# Patient Record
Sex: Female | Born: 1950
Health system: Southern US, Community
[De-identification: ages and names within clinical notes are randomized; demographics above are authoritative.]

## PROBLEM LIST (undated history)

## (undated) DIAGNOSIS — D126 Benign neoplasm of colon, unspecified: Secondary | ICD-10-CM

## (undated) DIAGNOSIS — M5412 Radiculopathy, cervical region: Secondary | ICD-10-CM

## (undated) DIAGNOSIS — D649 Anemia, unspecified: Secondary | ICD-10-CM

## (undated) DIAGNOSIS — E119 Type 2 diabetes mellitus without complications: Secondary | ICD-10-CM

## (undated) DIAGNOSIS — J040 Acute laryngitis: Secondary | ICD-10-CM

## (undated) DIAGNOSIS — I700 Atherosclerosis of aorta: Secondary | ICD-10-CM

## (undated) DIAGNOSIS — Q7649 Other congenital malformations of spine, not associated with scoliosis: Secondary | ICD-10-CM

## (undated) DIAGNOSIS — F32A Depression, unspecified: Secondary | ICD-10-CM

## (undated) DIAGNOSIS — M3390 Dermatopolymyositis, unspecified, organ involvement unspecified: Secondary | ICD-10-CM

## (undated) DIAGNOSIS — E7800 Pure hypercholesterolemia, unspecified: Secondary | ICD-10-CM

## (undated) DIAGNOSIS — H3530 Unspecified macular degeneration: Secondary | ICD-10-CM

## (undated) DIAGNOSIS — M8580 Other specified disorders of bone density and structure, unspecified site: Secondary | ICD-10-CM

## (undated) DIAGNOSIS — K644 Residual hemorrhoidal skin tags: Secondary | ICD-10-CM

## (undated) DIAGNOSIS — R319 Hematuria, unspecified: Secondary | ICD-10-CM

## (undated) DIAGNOSIS — K7581 Nonalcoholic steatohepatitis (NASH): Secondary | ICD-10-CM

## (undated) DIAGNOSIS — M1990 Unspecified osteoarthritis, unspecified site: Secondary | ICD-10-CM

## (undated) DIAGNOSIS — R569 Unspecified convulsions: Secondary | ICD-10-CM

## (undated) DIAGNOSIS — R748 Abnormal levels of other serum enzymes: Secondary | ICD-10-CM

## (undated) DIAGNOSIS — I499 Cardiac arrhythmia, unspecified: Secondary | ICD-10-CM

## (undated) DIAGNOSIS — T783XXA Angioneurotic edema, initial encounter: Secondary | ICD-10-CM

## (undated) DIAGNOSIS — S42009A Fracture of unspecified part of unspecified clavicle, initial encounter for closed fracture: Secondary | ICD-10-CM

## (undated) DIAGNOSIS — K760 Fatty (change of) liver, not elsewhere classified: Secondary | ICD-10-CM

## (undated) DIAGNOSIS — M3313 Other dermatomyositis without myopathy: Secondary | ICD-10-CM

## (undated) DIAGNOSIS — M545 Low back pain, unspecified: Secondary | ICD-10-CM

## (undated) DIAGNOSIS — G5600 Carpal tunnel syndrome, unspecified upper limb: Secondary | ICD-10-CM

## (undated) DIAGNOSIS — M797 Fibromyalgia: Secondary | ICD-10-CM

## (undated) DIAGNOSIS — I10 Essential (primary) hypertension: Secondary | ICD-10-CM

## (undated) DIAGNOSIS — K648 Other hemorrhoids: Secondary | ICD-10-CM

## (undated) HISTORY — DX: Atherosclerosis of aorta: I70.0

## (undated) HISTORY — DX: Fatty (change of) liver, not elsewhere classified: K76.0

## (undated) HISTORY — DX: Carpal tunnel syndrome, unspecified upper limb: G56.00

## (undated) HISTORY — DX: Hematuria, unspecified: R31.9

## (undated) HISTORY — DX: Abnormal levels of other serum enzymes: R74.8

## (undated) HISTORY — PX: APPENDECTOMY: SHX54

## (undated) HISTORY — PX: BACK SURGERY: SHX140

## (undated) HISTORY — DX: Benign neoplasm of colon, unspecified: D12.6

## (undated) HISTORY — DX: Other dermatomyositis without myopathy: M33.13

## (undated) HISTORY — DX: Pure hypercholesterolemia, unspecified: E78.00

## (undated) HISTORY — PX: TONSILLECTOMY: SUR1361

## (undated) HISTORY — DX: Fracture of unspecified part of unspecified clavicle, initial encounter for closed fracture: S42.009A

## (undated) HISTORY — PX: ADENOIDECTOMY: SUR15

## (undated) HISTORY — DX: Acute laryngitis: J04.0

## (undated) HISTORY — DX: Radiculopathy, cervical region: M54.12

## (undated) HISTORY — DX: Residual hemorrhoidal skin tags: K64.4

## (undated) HISTORY — DX: Dermatopolymyositis, unspecified, organ involvement unspecified: M33.90

## (undated) HISTORY — PX: LAMINECTOMY: SHX219

## (undated) HISTORY — DX: Other hemorrhoids: K64.8

## (undated) HISTORY — DX: Low back pain, unspecified: M54.50

## (undated) HISTORY — DX: Unspecified macular degeneration: H35.30

## (undated) HISTORY — PX: CARPAL TUNNEL RELEASE: SHX101

## (undated) HISTORY — DX: Other congenital malformations of spine, not associated with scoliosis: Q76.49

## (undated) HISTORY — DX: Unspecified convulsions: R56.9

## (undated) HISTORY — DX: Essential (primary) hypertension: I10

## (undated) HISTORY — DX: Angioneurotic edema, initial encounter: T78.3XXA

## (undated) HISTORY — DX: Other specified disorders of bone density and structure, unspecified site: M85.80

## (undated) HISTORY — PX: EXPLORATORY LAPAROTOMY: SUR591

## (undated) HISTORY — PX: CATARACT EXTRACTION, BILATERAL: SHX1313

## (undated) HISTORY — PX: SINOSCOPY: SHX187

## (undated) HISTORY — PX: COLONOSCOPY: SHX174

## (undated) HISTORY — DX: Low back pain: M54.5

---

## 1980-07-01 HISTORY — PX: ABDOMINAL HYSTERECTOMY: SHX81

## 1998-07-01 HISTORY — PX: SHOULDER SURGERY: SHX246

## 1999-05-23 ENCOUNTER — Encounter: Admission: RE | Admit: 1999-05-23 | Discharge: 1999-05-23 | Payer: Self-pay | Admitting: Urology

## 1999-05-23 ENCOUNTER — Encounter: Payer: Self-pay | Admitting: Urology

## 2001-02-04 ENCOUNTER — Other Ambulatory Visit: Admission: RE | Admit: 2001-02-04 | Discharge: 2001-02-04 | Payer: Self-pay | Admitting: Obstetrics and Gynecology

## 2001-07-01 HISTORY — PX: CHOLECYSTECTOMY: SHX55

## 2002-02-10 ENCOUNTER — Other Ambulatory Visit: Admission: RE | Admit: 2002-02-10 | Discharge: 2002-02-10 | Payer: Self-pay | Admitting: Family Medicine

## 2002-03-04 ENCOUNTER — Encounter: Admission: RE | Admit: 2002-03-04 | Discharge: 2002-03-04 | Payer: Self-pay | Admitting: Family Medicine

## 2002-03-04 ENCOUNTER — Encounter: Payer: Self-pay | Admitting: Family Medicine

## 2002-03-11 ENCOUNTER — Encounter: Payer: Self-pay | Admitting: General Surgery

## 2002-03-13 ENCOUNTER — Observation Stay (HOSPITAL_COMMUNITY): Admission: RE | Admit: 2002-03-13 | Discharge: 2002-03-14 | Payer: Self-pay | Admitting: General Surgery

## 2002-03-13 ENCOUNTER — Encounter (INDEPENDENT_AMBULATORY_CARE_PROVIDER_SITE_OTHER): Payer: Self-pay | Admitting: Specialist

## 2002-03-13 ENCOUNTER — Encounter: Payer: Self-pay | Admitting: General Surgery

## 2002-12-10 ENCOUNTER — Encounter: Payer: Self-pay | Admitting: Family Medicine

## 2002-12-10 ENCOUNTER — Encounter: Admission: RE | Admit: 2002-12-10 | Discharge: 2002-12-10 | Payer: Self-pay | Admitting: Family Medicine

## 2003-02-22 ENCOUNTER — Other Ambulatory Visit: Admission: RE | Admit: 2003-02-22 | Discharge: 2003-02-22 | Payer: Self-pay | Admitting: Family Medicine

## 2003-03-09 ENCOUNTER — Encounter: Admission: RE | Admit: 2003-03-09 | Discharge: 2003-03-09 | Payer: Self-pay | Admitting: Neurology

## 2003-03-09 ENCOUNTER — Encounter: Payer: Self-pay | Admitting: Neurology

## 2004-01-05 ENCOUNTER — Encounter: Admission: RE | Admit: 2004-01-05 | Discharge: 2004-01-05 | Payer: Self-pay | Admitting: Family Medicine

## 2004-02-23 ENCOUNTER — Other Ambulatory Visit: Admission: RE | Admit: 2004-02-23 | Discharge: 2004-02-23 | Payer: Self-pay | Admitting: Family Medicine

## 2005-01-02 ENCOUNTER — Encounter: Admission: RE | Admit: 2005-01-02 | Discharge: 2005-01-02 | Payer: Self-pay | Admitting: Neurology

## 2005-01-15 ENCOUNTER — Encounter: Admission: RE | Admit: 2005-01-15 | Discharge: 2005-01-15 | Payer: Self-pay | Admitting: Family Medicine

## 2006-09-04 ENCOUNTER — Encounter: Admission: RE | Admit: 2006-09-04 | Discharge: 2006-09-04 | Payer: Self-pay | Admitting: Internal Medicine

## 2007-03-26 ENCOUNTER — Other Ambulatory Visit: Admission: RE | Admit: 2007-03-26 | Discharge: 2007-03-26 | Payer: Self-pay | Admitting: Obstetrics and Gynecology

## 2007-04-09 ENCOUNTER — Other Ambulatory Visit: Admission: RE | Admit: 2007-04-09 | Discharge: 2007-04-09 | Payer: Self-pay | Admitting: Obstetrics and Gynecology

## 2008-07-05 ENCOUNTER — Encounter: Admission: RE | Admit: 2008-07-05 | Discharge: 2008-07-05 | Payer: Self-pay | Admitting: Internal Medicine

## 2008-08-02 ENCOUNTER — Encounter: Admission: RE | Admit: 2008-08-02 | Discharge: 2008-08-02 | Payer: Self-pay | Admitting: Internal Medicine

## 2008-10-18 ENCOUNTER — Encounter: Admission: RE | Admit: 2008-10-18 | Discharge: 2008-10-18 | Payer: Self-pay | Admitting: Internal Medicine

## 2008-10-24 ENCOUNTER — Encounter: Admission: RE | Admit: 2008-10-24 | Discharge: 2008-10-24 | Payer: Self-pay | Admitting: Internal Medicine

## 2009-04-17 ENCOUNTER — Encounter: Admission: RE | Admit: 2009-04-17 | Discharge: 2009-04-17 | Payer: Self-pay | Admitting: Internal Medicine

## 2009-11-07 ENCOUNTER — Encounter: Admission: RE | Admit: 2009-11-07 | Discharge: 2009-11-07 | Payer: Self-pay | Admitting: Internal Medicine

## 2009-11-08 ENCOUNTER — Encounter: Admission: RE | Admit: 2009-11-08 | Discharge: 2009-11-08 | Payer: Self-pay | Admitting: Internal Medicine

## 2010-07-22 ENCOUNTER — Encounter: Payer: Self-pay | Admitting: Internal Medicine

## 2010-07-24 ENCOUNTER — Encounter
Admission: RE | Admit: 2010-07-24 | Discharge: 2010-07-24 | Payer: Self-pay | Source: Home / Self Care | Attending: Orthopaedic Surgery | Admitting: Orthopaedic Surgery

## 2010-10-10 ENCOUNTER — Other Ambulatory Visit: Payer: Self-pay | Admitting: Internal Medicine

## 2010-10-10 DIAGNOSIS — Z1231 Encounter for screening mammogram for malignant neoplasm of breast: Secondary | ICD-10-CM

## 2010-11-15 ENCOUNTER — Ambulatory Visit
Admission: RE | Admit: 2010-11-15 | Discharge: 2010-11-15 | Disposition: A | Discharge status: Home / Self Care | Payer: BC Managed Care – PPO | Source: Ambulatory Visit | Attending: Internal Medicine | Admitting: Internal Medicine

## 2010-11-15 DIAGNOSIS — Z1231 Encounter for screening mammogram for malignant neoplasm of breast: Secondary | ICD-10-CM

## 2010-11-16 NOTE — Op Note (Signed)
NAME:  Amber Bennett, Amber Bennett NO.:  [id number]   MEDICAL RECORD NO.:  [id number]                    PATIENT TYPE:   LOCATION:                                       FACILITY:  [facility]   PHYSICIAN:  Angelia Mould. Derrell Lolling, M.D.             DATE OF BIRTH:   DATE OF PROCEDURE:  05/16/2002  DATE OF DISCHARGE:                                 OPERATIVE REPORT   PREOPERATIVE DIAGNOSIS:  Chronic cholecystitis with cholelithiasis.   POSTOPERATIVE DIAGNOSIS:  Chronic cholecystitis with cholelithiasis.   PROCEDURE:  Laparoscopic cholecystectomy with intraoperative cholangiogram.   SURGEON:  Angelia Mould. Derrell Lolling, M.D.   FIRST ASSISTANT:  Velora Heckler, M.D.   INDICATIONS:  This is a 60 year old white female who has reflux symptoms  which are fairly well controlled.  She presented with a 10 day history of  daily nocturnal episodes of epigastric and back pain and nausea.  She has  vomited.  This tends to occur three to four hours after supper.  She had  some diarrhea but not much. Ultrasound was performed which showed a 2.2 cm  gallstone, normal common bile duct.  She has had an upper GI which shows a  small hiatal hernia but no major abnormalities.  It is felt that she is  having biliary colic, and she is brought to the operating room electively.   DESCRIPTION OF PROCEDURE:  Following the induction of general endotracheal  anesthesia, the patient's abdomen was prepped and draped in a sterile  fashion.  Incision was made below the umbilicus.  The fascia was incised in  the midline and the abdominal cavity entered under direct vision.  Pneumoperitoneum was created.  A video camera was inserted.  She had some  adhesions from previous splenectomy, but we were able to ultimately take all  of the adhesions down.  We placed a 10 mm trocar in the subxiphoid region  and two 5 mm trocars in the right mid abdomen. We took all of the adhesions  down that were related to her splenectomy.   This was not difficult.  We  identified the fundus of the gallbladder and elevated that.  We took some  adhesions down off of the gallbladder.  We dissected out the infundibulum of  the gallbladder and the cystic duct and the cystic artery.  We isolated the  cystic artery as it went on the gallbladder wall, secured it with clips and  divided.  We created a large window behind the cystic duct.  A metal clip  was placed on the cystic duct close to the gallbladder.  A cholangiogram  catheter was inserted into the cystic duct.  Cholangiogram was obtained  using a C-arm.  The cholangiogram was normal.  This showed intrahepatic and  extrahepatic bile ducts, prompt flow of contrast into the duodenum and no  filling defects.  The cholangiogram catheter was removed.  The cystic duct  was secured with metal clips and divided.  The gallbladder was dissected  from its bed with electrocautery and removed to the umbilical port.   The operative field was examined and irrigated.  There was a small  laceration to the far lateral side of the right liver capsule and that bled  about 100-200 and then resolved.  Because of this bleeding although it had  stopped I felt it best to place a drain, and a 19 [place] Blake drain was  placed and brought out through one of the lateral trocar sites and connected  to a suction bulb.  After further irrigation we felt that everything was  hemostatic.  There was no bleeding and no bile leak whatsoever.  The trocars  were removed under direct vision.  There was no bleeding from the trocar  site.  The pneumoperitoneum was released.  The fascia at the umbilicus was  closed with 0 Vicryl sutures.  The skin  incisions were closed with subcuticular sutures of 4-0 Vicryl and Steri-  Strips.  Clean bandages were placed, and the patient was taken to the  recovery room in stable condition.  Estimated blood loss was about 200 cc.  Complications none. Sponge, needle and instrument  counts were correct.                                               Angelia Mould. Derrell Lolling, M.D.    HMI/MEDQ  D:  05/16/2002  T:  05/16/2002  Job:  [id number]   cc:   Vale Haven. Andrey Campanile, M.D.  [address]  [place]  [place] [address]  Fax: [phone]

## 2010-11-16 NOTE — Discharge Summary (Signed)
NAME:  Amber Bennett, Amber Bennett NO.:  [id number]   MEDICAL RECORD NO.:  [id number]                   PATIENT TYPE:   LOCATION:                                       FACILITY:   PHYSICIAN:  Angelia Mould. Derrell Lolling, M.D.             DATE OF BIRTH:   DATE OF ADMISSION:  03/13/2002  DATE OF DISCHARGE:  03/14/2002                                 DISCHARGE SUMMARY   FINAL DIAGNOSIS:  Chronic cholecystitis with cholelithiasis.   OPERATION:  Laparoscopic cholecystectomy with intraoperative cholangiogram  on 03/13/02.   HISTORY OF PRESENT ILLNESS:  This is a 60 year old white female with a long  history of reflux symptoms.  She presented with the recent onset of daily  nocturnal epigastric and back pain, nausea and vomiting.  She has had some  intermittent diarrhea as well.  Ultrasound was performed which showed a 2.2  cm gallstone, normal common bile duct, otherwise normal ultrasound.  She had  an upper GI which reportedly showed a small hiatal hernia.   For details of her past history, medications, drug allergies, social  history, family history, review of systems, please see the detailed  admission note.   PHYSICAL EXAMINATION:  GENERAL:  A pleasant middle-aged woman, slightly  overweight, no distress.  HEENT:  Sclerae clear.  NECK:  Supple, nontender, no mass, no bruits.  LUNGS:  Clear to auscultation.  HEART:  Regular rate and rhythm, no murmur.  ABDOMEN:  Soft, nontender, no mass, well-healed Pfannenstiel incision.  Well-  healed midline incision extending around below the umbilicus.  Well-healed  right lower median incision from her appendectomy.  No hernias.  EXTREMITIES:  No edema, good pulses.  She has a brace on her left ankle.   HOSPITAL COURSE:  On the day of admission, the patient was taken to the  operating room and underwent a laparoscopic cholecystectomy with  intraoperative cholangiogram.  The cholangiogram was normal.  A small  laceration to the  far lateral right liver capsule was recognized, and  probably bled 100 to 200 cc, and then the bleeding stopped after some  cautery and pressure.  We left a drain in place.   Postoperatively, the patient did well.  She had only light serous drainage  from the drain, and it was removed on 03/14/02.  She progressed in her diet  and activities without too much trouble, minimal abdominal bloating.  She  tolerated a good diet.  She was discharged on 03/13/02.    DISCHARGE MEDICATIONS:  1. Vicodin for pain.  2. Told to resume all of her usual medications.   FOLLOWUP:  She was asked to return to see me in the office in two to three  weeks.  Angelia Mould. Derrell Lolling, M.D.    HMI/MEDQ  D:  05/10/2002  T:  05/11/2002  Job:  [id number]   cc:   Vale Haven. Andrey Campanile, M.D.  [address]  [place]  [place] [address]  Fax: [phone]

## 2011-01-01 ENCOUNTER — Other Ambulatory Visit: Payer: Self-pay | Admitting: Orthopaedic Surgery

## 2011-01-01 DIAGNOSIS — M79605 Pain in left leg: Secondary | ICD-10-CM

## 2011-01-01 DIAGNOSIS — M545 Low back pain: Secondary | ICD-10-CM

## 2011-01-08 ENCOUNTER — Other Ambulatory Visit: Payer: Self-pay | Admitting: Orthopaedic Surgery

## 2011-01-08 ENCOUNTER — Ambulatory Visit
Admission: RE | Admit: 2011-01-08 | Discharge: 2011-01-08 | Disposition: A | Discharge status: Home / Self Care | Payer: BC Managed Care – PPO | Source: Ambulatory Visit | Attending: Orthopaedic Surgery | Admitting: Orthopaedic Surgery

## 2011-01-08 DIAGNOSIS — M79605 Pain in left leg: Secondary | ICD-10-CM

## 2011-01-08 DIAGNOSIS — M545 Low back pain, unspecified: Secondary | ICD-10-CM

## 2011-04-02 ENCOUNTER — Other Ambulatory Visit: Payer: Self-pay | Admitting: Internal Medicine

## 2011-04-02 DIAGNOSIS — M545 Low back pain, unspecified: Secondary | ICD-10-CM

## 2011-04-02 DIAGNOSIS — M549 Dorsalgia, unspecified: Secondary | ICD-10-CM

## 2011-04-03 ENCOUNTER — Ambulatory Visit
Admission: RE | Admit: 2011-04-03 | Discharge: 2011-04-03 | Disposition: A | Discharge status: Home / Self Care | Payer: BC Managed Care – PPO | Source: Ambulatory Visit | Attending: Internal Medicine | Admitting: Internal Medicine

## 2011-04-03 DIAGNOSIS — M549 Dorsalgia, unspecified: Secondary | ICD-10-CM

## 2011-04-03 DIAGNOSIS — M545 Low back pain: Secondary | ICD-10-CM

## 2011-04-04 ENCOUNTER — Other Ambulatory Visit: Payer: Self-pay | Admitting: Neurological Surgery

## 2011-04-04 DIAGNOSIS — M79605 Pain in left leg: Secondary | ICD-10-CM

## 2011-04-04 MED ORDER — GADOBENATE DIMEGLUMINE 529 MG/ML IV SOLN
12.0000 mL | Freq: Once | INTRAVENOUS | Status: AC | PRN
  Administered 2011-04-04: 12 mL via INTRAVENOUS

## 2011-04-08 ENCOUNTER — Ambulatory Visit
Admission: RE | Admit: 2011-04-08 | Discharge: 2011-04-08 | Disposition: A | Discharge status: Home / Self Care | Payer: BC Managed Care – PPO | Source: Ambulatory Visit | Attending: Neurological Surgery | Admitting: Neurological Surgery

## 2011-04-08 ENCOUNTER — Other Ambulatory Visit: Payer: Self-pay | Admitting: Neurological Surgery

## 2011-04-08 DIAGNOSIS — M79605 Pain in left leg: Secondary | ICD-10-CM

## 2011-04-08 MED ORDER — METHYLPREDNISOLONE ACETATE 40 MG/ML INJ SUSP (RADIOLOG
120.0000 mg | Freq: Once | INTRAMUSCULAR | Status: AC
  Administered 2011-04-08: 120 mg via EPIDURAL

## 2011-04-08 MED ORDER — IOHEXOL 180 MG/ML  SOLN
1.0000 mL | Freq: Once | INTRAMUSCULAR | Status: AC | PRN
  Administered 2011-04-08: 1 mL via EPIDURAL

## 2011-10-14 ENCOUNTER — Other Ambulatory Visit: Payer: Self-pay | Admitting: Internal Medicine

## 2011-10-14 DIAGNOSIS — Z1231 Encounter for screening mammogram for malignant neoplasm of breast: Secondary | ICD-10-CM

## 2011-10-31 ENCOUNTER — Other Ambulatory Visit: Payer: Self-pay | Admitting: Dermatology

## 2011-11-14 ENCOUNTER — Other Ambulatory Visit: Payer: Self-pay | Admitting: Dermatology

## 2011-11-18 ENCOUNTER — Ambulatory Visit
Admission: RE | Admit: 2011-11-18 | Discharge: 2011-11-18 | Disposition: A | Discharge status: Home / Self Care | Payer: BC Managed Care – PPO | Source: Ambulatory Visit | Attending: Internal Medicine | Admitting: Internal Medicine

## 2011-11-18 DIAGNOSIS — Z1231 Encounter for screening mammogram for malignant neoplasm of breast: Secondary | ICD-10-CM

## 2012-06-02 DIAGNOSIS — IMO0002 Reserved for concepts with insufficient information to code with codable children: Secondary | ICD-10-CM | POA: Insufficient documentation

## 2012-06-02 DIAGNOSIS — I10 Essential (primary) hypertension: Secondary | ICD-10-CM | POA: Insufficient documentation

## 2012-06-02 DIAGNOSIS — E785 Hyperlipidemia, unspecified: Secondary | ICD-10-CM | POA: Insufficient documentation

## 2012-06-02 DIAGNOSIS — R569 Unspecified convulsions: Secondary | ICD-10-CM | POA: Insufficient documentation

## 2012-06-02 DIAGNOSIS — G542 Cervical root disorders, not elsewhere classified: Secondary | ICD-10-CM | POA: Insufficient documentation

## 2012-06-02 DIAGNOSIS — M3390 Dermatopolymyositis, unspecified, organ involvement unspecified: Secondary | ICD-10-CM | POA: Insufficient documentation

## 2012-06-02 DIAGNOSIS — H3530 Unspecified macular degeneration: Secondary | ICD-10-CM | POA: Insufficient documentation

## 2012-06-02 DIAGNOSIS — Z888 Allergy status to other drugs, medicaments and biological substances status: Secondary | ICD-10-CM | POA: Insufficient documentation

## 2012-11-16 ENCOUNTER — Telehealth: Payer: Self-pay | Admitting: Neurology

## 2012-11-17 ENCOUNTER — Telehealth: Payer: Self-pay

## 2012-11-17 MED ORDER — TOPIRAMATE 50 MG PO TABS: 50.0000 mg | ORAL_TABLET | ORAL | Status: DC

## 2012-11-17 MED ORDER — TOPIRAMATE 50 MG PO TABS: ORAL_TABLET | ORAL | Status: DC

## 2012-11-17 MED ORDER — TOPIRAMATE 100 MG PO TABS: 100.0000 mg | ORAL_TABLET | Freq: Every evening | ORAL | Status: DC

## 2012-11-17 NOTE — Telephone Encounter (Signed)
I spoke with the patient for several minutes.  She said she wants [place] to write two Topamax Rx's.  Says she gets confused and it is easier for her to have one bottle of 100mg  and one bottle of 50mg .  She does not want to break up her 50mg  bottle to am and pm doses.  I advised her there would be 2 co-pays since she is requesting 2 Rx's.  She said she will pay 2 co-pays at this time and may change her mind in the future.  She is a former [facility] patient assigned to Dr Anne Hahn.  She said she was concerned because she asked the pharmacy to send the refill request to Dr Anne Hahn (instead of Dr Sandria Manly) yesterday and hadn't heard anything.  When I was reviewing chart in [facility] to verify patient dose, I see that the pharmacy sent a refill request to [facility]/Dr Hickling yesterday.  I explained this to the patient.  She thanked me for calling back and said she will be in for an appt in June with Dr Anne Hahn.

## 2012-11-17 NOTE — Telephone Encounter (Signed)
Patient called requesting refill on Topamax.  She asked that it be sent to [facility].  Former [facility] patient assigned to Dr Anne Hahn.

## 2012-12-01 ENCOUNTER — Ambulatory Visit (INDEPENDENT_AMBULATORY_CARE_PROVIDER_SITE_OTHER): Payer: BC Managed Care – PPO | Admitting: Neurology

## 2012-12-01 ENCOUNTER — Other Ambulatory Visit: Payer: Self-pay

## 2012-12-01 ENCOUNTER — Encounter: Payer: Self-pay | Admitting: Neurology

## 2012-12-01 VITALS — BP 135/80 | HR 80 | Ht 58.5 in | Wt 139.0 lb

## 2012-12-01 DIAGNOSIS — E785 Hyperlipidemia, unspecified: Secondary | ICD-10-CM

## 2012-12-01 DIAGNOSIS — IMO0002 Reserved for concepts with insufficient information to code with codable children: Secondary | ICD-10-CM

## 2012-12-01 DIAGNOSIS — M3390 Dermatopolymyositis, unspecified, organ involvement unspecified: Secondary | ICD-10-CM

## 2012-12-01 DIAGNOSIS — R569 Unspecified convulsions: Secondary | ICD-10-CM

## 2012-12-01 DIAGNOSIS — G542 Cervical root disorders, not elsewhere classified: Secondary | ICD-10-CM

## 2012-12-01 DIAGNOSIS — Z1231 Encounter for screening mammogram for malignant neoplasm of breast: Secondary | ICD-10-CM

## 2012-12-01 DIAGNOSIS — H3530 Unspecified macular degeneration: Secondary | ICD-10-CM

## 2012-12-01 DIAGNOSIS — I10 Essential (primary) hypertension: Secondary | ICD-10-CM

## 2012-12-01 DIAGNOSIS — Z888 Allergy status to other drugs, medicaments and biological substances status: Secondary | ICD-10-CM

## 2012-12-01 MED ORDER — PHENOBARBITAL 97.2 MG PO TABS: 150.0000 mg | ORAL_TABLET | Freq: Every day | ORAL | Status: DC

## 2012-12-01 NOTE — Progress Notes (Signed)
Reason for visit: Seizures  Amber Bennett is an 62 y.o. female  History of present illness:  Amber Bennett is a 62 year old right-handed white female with a history of seizures for the majority of her life. The patient has been on phenobarbital, and Topamax has been added to this. The patient last had a seizure in February 2012. The patient had sleep deprivation the day of the seizure. The patient has an aura associated with a sensation in the throat. The patient will have episodes of "staring off". The seizures are nonconvulsive. The patient operates a motor vehicle, and she is doing well with this. The patient is tolerating the medications well. The patient also has a history of dermatomyositis, and this is controlled on Imuran. The patient has had blood levels done of her medications in February of 2014. The phenobarbital level was 23, and the Topamax level was 4.1. Both of these are in the therapeutic range. The patient returns for an evaluation.  Past Medical History  Diagnosis Date  . Seizures   . Lower back pain   . HTN (hypertension)   . High cholesterol   . Osteopenia     Left side hip  . Cervical radiculitis   . Macular degeneration   . Carpal tunnel syndrome     Past Surgical History  Procedure Laterality Date  . Tonsillectomy    . Appendectomy    . Abdominal hysterectomy    . Back surgery    . Cholecystectomy    . Shoulder surgery Right 2000    Family History  Problem Relation Age of Onset  . Cancer Mother     Cancer of the bone marrow  . Stroke Sister   . Hypertension Sister     Social history:  reports that she quit smoking about 34 years ago. She does not have any smokeless tobacco history on file. She reports that  drinks alcohol. She reports that she does not use illicit drugs.  Allergies:  Allergies  Allergen Reactions  . Tylenol (Acetaminophen) Swelling and Other (See Comments)    Throat swelling    Medications:  Current Outpatient Prescriptions on  File Prior to Visit  Medication Sig Dispense Refill  . topiramate (TOPAMAX) 100 MG tablet Take 1 tablet (100 mg total) by mouth every evening.  90 tablet  1  . topiramate (TOPAMAX) 50 MG tablet Take 1 tablet (50 mg total) by mouth every morning.  90 tablet  1   No current facility-administered medications on file prior to visit.    ROS:  Out of a complete 14 system review of symptoms, the patient complains only of the following symptoms, and all other reviewed systems are negative.  History of seizures  Blood pressure 135/80, pulse 80, height 4' 10.5" (1.486 m), weight 139 lb (63.05 kg).  Physical Exam  General: The patient is alert and cooperative at the time of the examination.  Skin: No significant peripheral edema is noted.   Neurologic Exam  Cranial nerves: Facial symmetry is present. Speech is normal, no aphasia or dysarthria is noted. Extraocular movements are full. Visual fields are full.  Motor: The patient has good strength in all 4 extremities.  Coordination: The patient has good finger-nose-finger and heel-to-shin bilaterally.  Gait and station: The patient has a normal gait. Tandem gait is normal. Romberg is negative. No drift is seen.  Reflexes: Deep tendon reflexes are symmetric.   Assessment/Plan:  1. History of seizures, under good control   2. Dermatomyositis  The patient is doing well currently with the seizures and with the dermatomyositis. The patient will continue on the Topamax and phenobarbital, and she will followup in one year. A prescription was given for the phenobarbital. The patient will contact me if any concerns arise.  Marlan Palau MD 12/01/2012 8:18 PM  [facility] [address] [place], [place] [address]  Phone [phone] Fax [phone]

## 2012-12-07 ENCOUNTER — Ambulatory Visit
Admission: RE | Admit: 2012-12-07 | Discharge: 2012-12-07 | Disposition: A | Discharge status: Home / Self Care | Payer: BC Managed Care – PPO | Source: Ambulatory Visit

## 2012-12-07 DIAGNOSIS — Z1231 Encounter for screening mammogram for malignant neoplasm of breast: Secondary | ICD-10-CM

## 2013-01-15 ENCOUNTER — Telehealth: Payer: Self-pay | Admitting: Neurology

## 2013-01-15 MED ORDER — TOPIRAMATE 50 MG PO TABS: 50.0000 mg | ORAL_TABLET | ORAL | Status: DC

## 2013-01-15 MED ORDER — TOPIRAMATE 100 MG PO TABS: 100.0000 mg | ORAL_TABLET | Freq: Every evening | ORAL | Status: DC

## 2013-01-15 MED ORDER — GABAPENTIN 800 MG PO TABS: 800.0000 mg | ORAL_TABLET | Freq: Two times a day (BID) | ORAL | Status: DC

## 2013-01-15 NOTE — Telephone Encounter (Signed)
Pt called about her prescriptions gabapentin (NEURONTIN) 800 MG tablet & opiramate (TOPAMAX) 100 MG tablet states the pharmacy has been faxing over her refills but states they are not getting anything back from [place]. She would like for this to get taken care as soon possible. Thanks

## 2013-01-15 NOTE — Telephone Encounter (Signed)
We have not gotten any requests.  This is a former [facility] patient.  Pharmacy is likely sending requests through with his name on it, which will reject because he is no longer a practicing provider, therefore we do not get the request.  Rx's have been sent.

## 2013-04-12 ENCOUNTER — Encounter: Payer: Self-pay | Admitting: Certified Nurse Midwife

## 2013-04-14 ENCOUNTER — Ambulatory Visit (INDEPENDENT_AMBULATORY_CARE_PROVIDER_SITE_OTHER): Payer: BC Managed Care – PPO | Admitting: Certified Nurse Midwife

## 2013-04-14 ENCOUNTER — Encounter: Payer: Self-pay | Admitting: Certified Nurse Midwife

## 2013-04-14 VITALS — BP 112/64 | HR 72 | Resp 16 | Ht <= 58 in | Wt 140.0 lb

## 2013-04-14 DIAGNOSIS — Z01419 Encounter for gynecological examination (general) (routine) without abnormal findings: Secondary | ICD-10-CM

## 2013-04-14 NOTE — Progress Notes (Signed)
62 y.o. G86P2002 Married Caucasian Fe here for annual exam. Menopausal no HRT. Reports no vaginal bleeding or vaginal dryness. Has retired now and feeling so much better! Hypertension stable on medication. Sees PCP for medication management, labs. No health issues.  Patient's last menstrual period was 07/01/1980.          Sexually active: yes  The current method of family planning is status post hysterectomy.    Exercising: yes  sporadic Smoker:  no  Health Maintenance: Pap:  09-04-09 neg MMG:  7/14 Colonoscopy:  2007 BMD:   2011 scheduled with PCP 2/15. TDaP: 2010 Labs: none Self breast exam: done monthly   reports that she quit smoking about 34 years ago. She does not have any smokeless tobacco history on file. She reports that she drinks alcohol. She reports that she does not use illicit drugs.  Past Medical History  Diagnosis Date  . Seizures   . Lower back pain   . HTN (hypertension)   . High cholesterol   . Osteopenia     Left side hip  . Cervical radiculitis   . Macular degeneration   . Carpal tunnel syndrome   . Hematuria     negative evaluation  . Elevated liver enzymes   . Dermatomyositis     Past Surgical History  Procedure Laterality Date  . Tonsillectomy    . Appendectomy    . Back surgery    . Cholecystectomy  2003  . Shoulder surgery Right 2000  . Abdominal hysterectomy  1982  . Exploratory laparotomy    . Carpal tunnel release    . Laminectomy      Current Outpatient Prescriptions  Medication Sig Dispense Refill  . azaTHIOprine (IMURAN) 50 MG tablet Take 50 mg by mouth daily.      . Calcium Carbonate-Vitamin D (CALCIUM + D PO) Take by mouth daily.      . Coenzyme Q10 (CO Q 10 PO) Take 100 mg by mouth daily.      . Cyanocobalamin (VITAMIN B 12 PO) Take 1,000 mg by mouth daily.      . fluticasone (FLONASE) 50 MCG/ACT nasal spray daily.      . montelukast (SINGULAIR) 10 MG tablet Take 10 mg by mouth daily.      . Multiple Vitamins-Minerals  (MULTIVITAMIN PO) Take by mouth daily.      Marland Kitchen omega-3 acid ethyl esters (LOVAZA) 1 G capsule Take 1 capsule by mouth 4 (four) times daily.       Marland Kitchen PHENobarbital (LUMINAL) 97.2 MG tablet Take 1.5 tablets (145.8 mg total) by mouth daily.  135 tablet  3  . potassium chloride (K-DUR,KLOR-CON) 10 MEQ tablet Take 10 mEq by mouth daily.      Marland Kitchen topiramate (TOPAMAX) 100 MG tablet Take 1 tablet (100 mg total) by mouth every evening.  90 tablet  3  . topiramate (TOPAMAX) 50 MG tablet Take 1 tablet (50 mg total) by mouth every morning.  90 tablet  3  . triamterene-hydrochlorothiazide (MAXZIDE) 75-50 MG per tablet Take by mouth daily. 1/2 tablet      . ZETIA 10 MG tablet Take 10 mg by mouth daily.       No current facility-administered medications for this visit.    Family History  Problem Relation Age of Onset  . Cancer Mother     Cancer of the bone marrow  . Stroke Sister   . Hypertension Sister   . Diabetes Sister     ROS:  Pertinent  items are noted in HPI.  Otherwise, a comprehensive ROS was negative.  Exam:   BP 112/64  Pulse 72  Resp 16  Ht 4\' 10"  (1.473 m)  Wt 140 lb (63.504 kg)  BMI 29.27 kg/m2  LMP 07/01/1980 Height: 4\' 10"  (147.3 cm)  Ht Readings from Last 3 Encounters:  04/14/13 4\' 10"  (1.473 m)  12/01/12 4' 10.5" (1.486 m)  12/01/12 5\' 4"  (1.626 m)    General appearance: alert, cooperative and appears stated age Head: Normocephalic, without obvious abnormality, atraumatic Neck: no adenopathy, supple, symmetrical, trachea midline and thyroid normal to inspection and palpation Lungs: clear to auscultation bilaterally Breasts: normal appearance, no masses or tenderness, No nipple retraction or dimpling, No nipple discharge or bleeding, No axillary or supraclavicular adenopathy Heart: regular rate and rhythm Abdomen: soft, non-tender; no masses,  no organomegaly Extremities: extremities normal, atraumatic, no cyanosis or edema Skin: Skin color, texture, turgor normal. No  rashes or lesions Lymph nodes: Cervical, supraclavicular, and axillary nodes normal. No abnormal inguinal nodes palpated Neurologic: Grossly normal   Pelvic: External genitalia:  no lesions              Urethra:  normal appearing urethra with no masses, tenderness or lesions              Bartholin's and Skene's: normal                 Vagina: normal appearing vagina with normal color and discharge, no lesions              Cervix: absent              Pap taken: no Bimanual Exam:  Uterus:  uterus absent              Adnexa: normal adnexa and no mass, fullness, tenderness               Rectovaginal: Confirms               Anus:  normal sphincter tone, no lesions  A:  Well Woman with normal exam  Menopausal no HRT  Hypertension stable med. With PCP management  P:   Reviewed health and wellness pertinent to exam  Pap smear as per guidelines   Mammogram yearly pap smear not taken today  counseled on breast self exam, mammography screening, adequate intake of calcium and vitamin D, diet and exercise  return annually or prn  An After Visit Summary was printed and given to the patient.

## 2013-04-14 NOTE — Patient Instructions (Signed)

## 2013-04-15 NOTE — Progress Notes (Signed)
Note reviewed, agree with plan.  Cachet Mccutchen, MD  

## 2013-06-11 ENCOUNTER — Other Ambulatory Visit: Payer: Self-pay | Admitting: Neurology

## 2013-08-01 ENCOUNTER — Other Ambulatory Visit: Payer: Self-pay | Admitting: Neurology

## 2013-08-02 NOTE — Telephone Encounter (Signed)
Rx signed and faxed.

## 2013-11-29 ENCOUNTER — Ambulatory Visit: Payer: Self-pay | Admitting: Adult Health

## 2013-11-30 ENCOUNTER — Ambulatory Visit: Payer: BC Managed Care – PPO | Admitting: Neurology

## 2013-12-01 ENCOUNTER — Encounter: Payer: Self-pay | Admitting: Adult Health

## 2013-12-01 ENCOUNTER — Encounter (INDEPENDENT_AMBULATORY_CARE_PROVIDER_SITE_OTHER): Payer: Self-pay

## 2013-12-01 ENCOUNTER — Ambulatory Visit (INDEPENDENT_AMBULATORY_CARE_PROVIDER_SITE_OTHER): Payer: Medicare Other | Admitting: Adult Health

## 2013-12-01 VITALS — BP 137/79 | HR 77 | Ht <= 58 in | Wt 149.0 lb

## 2013-12-01 DIAGNOSIS — R569 Unspecified convulsions: Secondary | ICD-10-CM | POA: Diagnosis not present

## 2013-12-01 MED ORDER — PHENOBARBITAL 97.2 MG PO TABS: ORAL_TABLET | ORAL | Status: DC

## 2013-12-01 MED ORDER — TOPIRAMATE 100 MG PO TABS: 100.0000 mg | ORAL_TABLET | Freq: Every evening | ORAL | Status: DC

## 2013-12-01 MED ORDER — TOPIRAMATE 50 MG PO TABS: 50.0000 mg | ORAL_TABLET | ORAL | Status: DC

## 2013-12-01 MED ORDER — GABAPENTIN 400 MG PO CAPS: 400.0000 mg | ORAL_CAPSULE | Freq: Every day | ORAL | Status: DC

## 2013-12-01 NOTE — Progress Notes (Signed)
PATIENT: Amber Bennett DOB: 1950/08/07  REASON FOR VISIT: follow up HISTORY FROM: patient  HISTORY OF PRESENT ILLNESS: Amber Bennett is a 63- year-old right-handed white female with a history of seizures. She returns today for follow-up. The patient currently takes phenobarbital and Topamax and is tolerating it well. The patient also has dermatomyositis and is currently taking Imuran. She reports that her last seizure was 3 years ago and it was contributed to sleep deprivation. In the past, patient had back surgery due to sciatic pain on the left side. She reports that she is now having pain on the right side. She is trying to exercise and strengthen her back. She states that if it gets worse she will visit Dr. Ronnald Bennett who did her last surgery. Patient recently had blood work in November by her PCP and her PCP will draw them again in July.   REVIEW OF SYSTEMS: Full 14 system review of systems performed and notable only for:  Constitutional: N/A  Eyes: N/A Ear/Nose/Throat: N/A  Skin: N/A  Cardiovascular: N/A  Respiratory: N/A  Gastrointestinal: N/A  Genitourinary: N/A Hematology/Lymphatic: N/A  Endocrine: N/A Musculoskeletal:N/A  Allergy/Immunology: N/A  Neurological: numbness Psychiatric: N/A Sleep: N/A   ALLERGIES: Allergies  Allergen Reactions  . Tylenol [Acetaminophen] Swelling and Other (See Comments)    Throat swelling  . Latex     HOME MEDICATIONS: Outpatient Prescriptions Prior to Visit  Medication Sig Dispense Refill  . azaTHIOprine (IMURAN) 50 MG tablet Take 50 mg by mouth daily.      . Calcium Carbonate-Vitamin D (CALCIUM + D PO) Take by mouth daily.      . Coenzyme Q10 (CO Q 10 PO) Take 100 mg by mouth daily.      . Cyanocobalamin (VITAMIN B 12 PO) Take 1,000 mg by mouth daily.      . fluticasone (FLONASE) 50 MCG/ACT nasal spray daily.      . montelukast (SINGULAIR) 10 MG tablet Take 10 mg by mouth daily.      . Multiple Vitamins-Minerals (MULTIVITAMIN PO) Take  by mouth daily.      Marland Kitchen omega-3 acid ethyl esters (LOVAZA) 1 G capsule Take 1 capsule by mouth 4 (four) times daily.       Marland Kitchen PHENobarbital (LUMINAL) 97.2 MG tablet TAKE 1 1/2 TABLET BY MOUTH DAILY  135 tablet  1  . potassium chloride (K-DUR,KLOR-CON) 10 MEQ tablet Take 10 mEq by mouth daily.      Marland Kitchen topiramate (TOPAMAX) 100 MG tablet Take 1 tablet (100 mg total) by mouth every evening.  90 tablet  3  . topiramate (TOPAMAX) 50 MG tablet Take 1 tablet (50 mg total) by mouth every morning.  90 tablet  3  . triamterene-hydrochlorothiazide (MAXZIDE) 75-50 MG per tablet Take by mouth daily. 1/2 tablet      . ZETIA 10 MG tablet Take 10 mg by mouth daily.       No facility-administered medications prior to visit.    PAST MEDICAL HISTORY: Past Medical History  Diagnosis Date  . Seizures   . Lower back pain   . HTN (hypertension)   . High cholesterol   . Osteopenia     Left side hip  . Cervical radiculitis   . Macular degeneration   . Carpal tunnel syndrome   . Hematuria     negative evaluation  . Elevated liver enzymes   . Dermatomyositis     PAST SURGICAL HISTORY: Past Surgical History  Procedure Laterality Date  .  Tonsillectomy    . Appendectomy    . Back surgery    . Cholecystectomy  2003  . Shoulder surgery Right 2000  . Abdominal hysterectomy  1982  . Exploratory laparotomy    . Carpal tunnel release    . Laminectomy      FAMILY HISTORY: Family History  Problem Relation Age of Onset  . Cancer Mother     Cancer of the bone marrow  . Stroke Sister   . Hypertension Sister   . Diabetes Sister     SOCIAL HISTORY: History   Social History  . Marital Status: Married    Spouse Name: N/A    Number of Children: 2  . Years of Education: 14   Occupational History  . Employed     Works for [employer], [employer] and [employer] History Main Topics  . Smoking status: Former Smoker    Quit date: 07/01/1978  . Smokeless tobacco: Not on file  . Alcohol Use: 0.0  oz/week     Comment: Consumes wine with dinner/socially  . Drug Use: No  . Sexual Activity: Yes    Partners: Male    Birth Control/ Protection: Surgical     Comment: TAH   Other Topics Concern  . Not on file   Social History Narrative  . No narrative on file      PHYSICAL EXAM  Filed Vitals:   12/01/13 1331  BP: 137/79  Pulse: 77  Height: 4\' 10"  (1.473 m)  Weight: 149 lb (67.586 kg)   Body mass index is 31.15 kg/(m^2).  Generalized: Well developed, in no acute distress   Neurological examination  Mentation: Alert oriented to time, place, history taking. Follows all commands speech and language fluent Cranial nerve II-XII: Extraocular movements were full, visual field were full on confrontational test. Motor: The motor testing reveals 5 over 5 strength of all 4 extremities. Good symmetric motor tone is noted throughout.  Sensory: Sensory testing is intact to soft touch on all 4 extremities. No evidence of extinction is noted.  Coordination: Cerebellar testing reveals good finger-nose-finger and heel-to-shin bilaterally.  Gait and station: Gait is normal. Tandem gait is normal. Romberg is negative. No drift is seen.  Reflexes: Deep tendon reflexes are symmetric and normal bilaterally.    DIAGNOSTIC DATA (LABS, IMAGING, TESTING) - I reviewed patient records, labs, notes, testing and imaging myself where available.    ASSESSMENT AND PLAN 63 y.o. year old female  has a past medical history of Seizures; Lower back pain; HTN (hypertension); High cholesterol; Osteopenia; Cervical radiculitis; Macular degeneration; Carpal tunnel syndrome; Hematuria; Elevated liver enzymes; and Dermatomyositis. here with:  1. Convulsions/seizures  Patient has remained stable. No seizures since 2012. Lab work was completed in November and will be repeated in July by PCP Tolerating phenobarbital and Topamax well, will refill today. Patient has taken gabapentin for sciatic pain, will refill  today. Follow-up in 1 year or sooner if needed.   Amber Givens, MSN, NP-C 12/01/2013, 1:39 PM [facility] [address], [place], [place] [address] [phone]  Note: This document was prepared with digital dictation and possible smart phrase technology. Any transcriptional errors that result from this process are unintentional.

## 2013-12-01 NOTE — Progress Notes (Signed)
I have read the note, and I agree with the clinical assessment and plan.  Amber Bennett   

## 2013-12-01 NOTE — Patient Instructions (Signed)
Seizure, Adult A seizure means there is unusual activity in the brain. A seizure can cause changes in attention or behavior. Seizures often cause shaking (convulsions). Seizures often last from 30 seconds to 2 minutes. HOME CARE   If you are given medicines, take them exactly as told by your doctor.  Keep all doctor visits as told.  Do not swim or drive until your doctor says it is okay.  Teach others what to do if you have a seizure. They should:  Lay you on the ground.  Put a cushion under your head.  Loosen any tight clothing around your neck.  Turn you on your side.  Stay with you until you get better. GET HELP RIGHT AWAY IF:   The seizure lasts longer than 2 to 5 minutes.  The seizure is very bad.  The person does not wake up after the seizure.  The person's attention or behavior changes. Drive the person to the emergency room or call your local emergency services (911 in [place] MAKE SURE YOU:   Understand these instructions.  Will watch your condition.  Will get help right away if you are not doing well or get worse. Document Released: 12/04/2007 Document Revised: 09/09/2011 Document Reviewed: 06/05/2011 Exit[employer] Patient Information 2014 [employer].  

## 2014-01-04 ENCOUNTER — Other Ambulatory Visit: Payer: Self-pay

## 2014-01-04 DIAGNOSIS — Z1231 Encounter for screening mammogram for malignant neoplasm of breast: Secondary | ICD-10-CM

## 2014-01-11 DIAGNOSIS — R82998 Other abnormal findings in urine: Secondary | ICD-10-CM | POA: Diagnosis not present

## 2014-01-11 DIAGNOSIS — R7301 Impaired fasting glucose: Secondary | ICD-10-CM | POA: Diagnosis not present

## 2014-01-11 DIAGNOSIS — I10 Essential (primary) hypertension: Secondary | ICD-10-CM | POA: Diagnosis not present

## 2014-01-11 DIAGNOSIS — M949 Disorder of cartilage, unspecified: Secondary | ICD-10-CM | POA: Diagnosis not present

## 2014-01-11 DIAGNOSIS — M899 Disorder of bone, unspecified: Secondary | ICD-10-CM | POA: Diagnosis not present

## 2014-01-11 DIAGNOSIS — E785 Hyperlipidemia, unspecified: Secondary | ICD-10-CM | POA: Diagnosis not present

## 2014-01-11 DIAGNOSIS — R569 Unspecified convulsions: Secondary | ICD-10-CM | POA: Diagnosis not present

## 2014-01-18 DIAGNOSIS — Z1212 Encounter for screening for malignant neoplasm of rectum: Secondary | ICD-10-CM | POA: Diagnosis not present

## 2014-01-20 ENCOUNTER — Ambulatory Visit
Admission: RE | Admit: 2014-01-20 | Discharge: 2014-01-20 | Disposition: A | Discharge status: Home / Self Care | Payer: Medicare Other | Source: Ambulatory Visit

## 2014-01-20 ENCOUNTER — Encounter (INDEPENDENT_AMBULATORY_CARE_PROVIDER_SITE_OTHER): Payer: Self-pay

## 2014-01-20 DIAGNOSIS — Z1231 Encounter for screening mammogram for malignant neoplasm of breast: Secondary | ICD-10-CM | POA: Diagnosis not present

## 2014-01-21 ENCOUNTER — Other Ambulatory Visit: Payer: Self-pay | Admitting: Neurology

## 2014-01-24 NOTE — Telephone Encounter (Signed)
Patient has been taking Gabapentin 800mg  bid since 2012 per [facility]

## 2014-01-26 ENCOUNTER — Telehealth: Payer: Self-pay | Admitting: Neurology

## 2014-01-26 DIAGNOSIS — E785 Hyperlipidemia, unspecified: Secondary | ICD-10-CM | POA: Diagnosis not present

## 2014-01-26 DIAGNOSIS — I10 Essential (primary) hypertension: Secondary | ICD-10-CM | POA: Diagnosis not present

## 2014-01-26 DIAGNOSIS — Z1331 Encounter for screening for depression: Secondary | ICD-10-CM | POA: Diagnosis not present

## 2014-01-26 DIAGNOSIS — R569 Unspecified convulsions: Secondary | ICD-10-CM | POA: Diagnosis not present

## 2014-01-26 DIAGNOSIS — M949 Disorder of cartilage, unspecified: Secondary | ICD-10-CM | POA: Diagnosis not present

## 2014-01-26 DIAGNOSIS — M549 Dorsalgia, unspecified: Secondary | ICD-10-CM | POA: Diagnosis not present

## 2014-01-26 DIAGNOSIS — I440 Atrioventricular block, first degree: Secondary | ICD-10-CM | POA: Diagnosis not present

## 2014-01-26 DIAGNOSIS — M899 Disorder of bone, unspecified: Secondary | ICD-10-CM | POA: Diagnosis not present

## 2014-01-26 DIAGNOSIS — R3129 Other microscopic hematuria: Secondary | ICD-10-CM | POA: Diagnosis not present

## 2014-01-26 DIAGNOSIS — Z0000 Encounter for general adult medical examination without abnormal findings: Secondary | ICD-10-CM | POA: Diagnosis not present

## 2014-01-26 NOTE — Telephone Encounter (Signed)
Blood work results were received from Dr. Joylene Draft. Phenobarbital level was 20, appear made level was 4.0 (2.0-25.0). Liver panel revealed an AST of 80, ALT of 137. CBC was unremarkable, TSH was unremarkable, hemoglobin. A1c 5.6

## 2014-02-01 DIAGNOSIS — IMO0002 Reserved for concepts with insufficient information to code with codable children: Secondary | ICD-10-CM | POA: Diagnosis not present

## 2014-02-01 DIAGNOSIS — M5126 Other intervertebral disc displacement, lumbar region: Secondary | ICD-10-CM | POA: Diagnosis not present

## 2014-02-01 DIAGNOSIS — M999 Biomechanical lesion, unspecified: Secondary | ICD-10-CM | POA: Diagnosis not present

## 2014-02-01 DIAGNOSIS — M25559 Pain in unspecified hip: Secondary | ICD-10-CM | POA: Diagnosis not present

## 2014-02-01 DIAGNOSIS — M5137 Other intervertebral disc degeneration, lumbosacral region: Secondary | ICD-10-CM | POA: Diagnosis not present

## 2014-02-02 DIAGNOSIS — M25559 Pain in unspecified hip: Secondary | ICD-10-CM | POA: Diagnosis not present

## 2014-02-02 DIAGNOSIS — IMO0002 Reserved for concepts with insufficient information to code with codable children: Secondary | ICD-10-CM | POA: Diagnosis not present

## 2014-02-02 DIAGNOSIS — M5137 Other intervertebral disc degeneration, lumbosacral region: Secondary | ICD-10-CM | POA: Diagnosis not present

## 2014-02-02 DIAGNOSIS — M999 Biomechanical lesion, unspecified: Secondary | ICD-10-CM | POA: Diagnosis not present

## 2014-02-02 DIAGNOSIS — M5126 Other intervertebral disc displacement, lumbar region: Secondary | ICD-10-CM | POA: Diagnosis not present

## 2014-02-03 DIAGNOSIS — M5126 Other intervertebral disc displacement, lumbar region: Secondary | ICD-10-CM | POA: Diagnosis not present

## 2014-02-03 DIAGNOSIS — M25559 Pain in unspecified hip: Secondary | ICD-10-CM | POA: Diagnosis not present

## 2014-02-03 DIAGNOSIS — M5137 Other intervertebral disc degeneration, lumbosacral region: Secondary | ICD-10-CM | POA: Diagnosis not present

## 2014-02-03 DIAGNOSIS — M999 Biomechanical lesion, unspecified: Secondary | ICD-10-CM | POA: Diagnosis not present

## 2014-02-03 DIAGNOSIS — IMO0002 Reserved for concepts with insufficient information to code with codable children: Secondary | ICD-10-CM | POA: Diagnosis not present

## 2014-02-07 DIAGNOSIS — IMO0002 Reserved for concepts with insufficient information to code with codable children: Secondary | ICD-10-CM | POA: Diagnosis not present

## 2014-02-07 DIAGNOSIS — M25559 Pain in unspecified hip: Secondary | ICD-10-CM | POA: Diagnosis not present

## 2014-02-07 DIAGNOSIS — M5137 Other intervertebral disc degeneration, lumbosacral region: Secondary | ICD-10-CM | POA: Diagnosis not present

## 2014-02-07 DIAGNOSIS — M999 Biomechanical lesion, unspecified: Secondary | ICD-10-CM | POA: Diagnosis not present

## 2014-02-07 DIAGNOSIS — M5126 Other intervertebral disc displacement, lumbar region: Secondary | ICD-10-CM | POA: Diagnosis not present

## 2014-02-08 DIAGNOSIS — M25559 Pain in unspecified hip: Secondary | ICD-10-CM | POA: Diagnosis not present

## 2014-02-08 DIAGNOSIS — M5126 Other intervertebral disc displacement, lumbar region: Secondary | ICD-10-CM | POA: Diagnosis not present

## 2014-02-08 DIAGNOSIS — IMO0002 Reserved for concepts with insufficient information to code with codable children: Secondary | ICD-10-CM | POA: Diagnosis not present

## 2014-02-08 DIAGNOSIS — M999 Biomechanical lesion, unspecified: Secondary | ICD-10-CM | POA: Diagnosis not present

## 2014-02-08 DIAGNOSIS — M5137 Other intervertebral disc degeneration, lumbosacral region: Secondary | ICD-10-CM | POA: Diagnosis not present

## 2014-02-09 DIAGNOSIS — M999 Biomechanical lesion, unspecified: Secondary | ICD-10-CM | POA: Diagnosis not present

## 2014-02-09 DIAGNOSIS — M25559 Pain in unspecified hip: Secondary | ICD-10-CM | POA: Diagnosis not present

## 2014-02-09 DIAGNOSIS — IMO0002 Reserved for concepts with insufficient information to code with codable children: Secondary | ICD-10-CM | POA: Diagnosis not present

## 2014-02-09 DIAGNOSIS — M5137 Other intervertebral disc degeneration, lumbosacral region: Secondary | ICD-10-CM | POA: Diagnosis not present

## 2014-02-09 DIAGNOSIS — M5126 Other intervertebral disc displacement, lumbar region: Secondary | ICD-10-CM | POA: Diagnosis not present

## 2014-02-14 DIAGNOSIS — IMO0002 Reserved for concepts with insufficient information to code with codable children: Secondary | ICD-10-CM | POA: Diagnosis not present

## 2014-02-14 DIAGNOSIS — M25559 Pain in unspecified hip: Secondary | ICD-10-CM | POA: Diagnosis not present

## 2014-02-14 DIAGNOSIS — M5137 Other intervertebral disc degeneration, lumbosacral region: Secondary | ICD-10-CM | POA: Diagnosis not present

## 2014-02-14 DIAGNOSIS — M5126 Other intervertebral disc displacement, lumbar region: Secondary | ICD-10-CM | POA: Diagnosis not present

## 2014-02-14 DIAGNOSIS — M999 Biomechanical lesion, unspecified: Secondary | ICD-10-CM | POA: Diagnosis not present

## 2014-02-15 DIAGNOSIS — M25559 Pain in unspecified hip: Secondary | ICD-10-CM | POA: Diagnosis not present

## 2014-02-15 DIAGNOSIS — M999 Biomechanical lesion, unspecified: Secondary | ICD-10-CM | POA: Diagnosis not present

## 2014-02-15 DIAGNOSIS — M5137 Other intervertebral disc degeneration, lumbosacral region: Secondary | ICD-10-CM | POA: Diagnosis not present

## 2014-02-15 DIAGNOSIS — IMO0002 Reserved for concepts with insufficient information to code with codable children: Secondary | ICD-10-CM | POA: Diagnosis not present

## 2014-02-15 DIAGNOSIS — M5126 Other intervertebral disc displacement, lumbar region: Secondary | ICD-10-CM | POA: Diagnosis not present

## 2014-02-16 DIAGNOSIS — M5126 Other intervertebral disc displacement, lumbar region: Secondary | ICD-10-CM | POA: Diagnosis not present

## 2014-02-16 DIAGNOSIS — IMO0002 Reserved for concepts with insufficient information to code with codable children: Secondary | ICD-10-CM | POA: Diagnosis not present

## 2014-02-16 DIAGNOSIS — M25559 Pain in unspecified hip: Secondary | ICD-10-CM | POA: Diagnosis not present

## 2014-02-16 DIAGNOSIS — M999 Biomechanical lesion, unspecified: Secondary | ICD-10-CM | POA: Diagnosis not present

## 2014-02-16 DIAGNOSIS — M5137 Other intervertebral disc degeneration, lumbosacral region: Secondary | ICD-10-CM | POA: Diagnosis not present

## 2014-02-21 DIAGNOSIS — M25559 Pain in unspecified hip: Secondary | ICD-10-CM | POA: Diagnosis not present

## 2014-02-21 DIAGNOSIS — M999 Biomechanical lesion, unspecified: Secondary | ICD-10-CM | POA: Diagnosis not present

## 2014-02-21 DIAGNOSIS — M5137 Other intervertebral disc degeneration, lumbosacral region: Secondary | ICD-10-CM | POA: Diagnosis not present

## 2014-02-21 DIAGNOSIS — IMO0002 Reserved for concepts with insufficient information to code with codable children: Secondary | ICD-10-CM | POA: Diagnosis not present

## 2014-02-21 DIAGNOSIS — M5126 Other intervertebral disc displacement, lumbar region: Secondary | ICD-10-CM | POA: Diagnosis not present

## 2014-02-22 DIAGNOSIS — M5137 Other intervertebral disc degeneration, lumbosacral region: Secondary | ICD-10-CM | POA: Diagnosis not present

## 2014-02-22 DIAGNOSIS — M25559 Pain in unspecified hip: Secondary | ICD-10-CM | POA: Diagnosis not present

## 2014-02-22 DIAGNOSIS — M5126 Other intervertebral disc displacement, lumbar region: Secondary | ICD-10-CM | POA: Diagnosis not present

## 2014-02-22 DIAGNOSIS — IMO0002 Reserved for concepts with insufficient information to code with codable children: Secondary | ICD-10-CM | POA: Diagnosis not present

## 2014-02-22 DIAGNOSIS — M999 Biomechanical lesion, unspecified: Secondary | ICD-10-CM | POA: Diagnosis not present

## 2014-03-01 DIAGNOSIS — M999 Biomechanical lesion, unspecified: Secondary | ICD-10-CM | POA: Diagnosis not present

## 2014-03-01 DIAGNOSIS — M25559 Pain in unspecified hip: Secondary | ICD-10-CM | POA: Diagnosis not present

## 2014-03-01 DIAGNOSIS — M5137 Other intervertebral disc degeneration, lumbosacral region: Secondary | ICD-10-CM | POA: Diagnosis not present

## 2014-03-01 DIAGNOSIS — M5126 Other intervertebral disc displacement, lumbar region: Secondary | ICD-10-CM | POA: Diagnosis not present

## 2014-03-01 DIAGNOSIS — IMO0002 Reserved for concepts with insufficient information to code with codable children: Secondary | ICD-10-CM | POA: Diagnosis not present

## 2014-03-02 DIAGNOSIS — M5137 Other intervertebral disc degeneration, lumbosacral region: Secondary | ICD-10-CM | POA: Diagnosis not present

## 2014-03-02 DIAGNOSIS — M999 Biomechanical lesion, unspecified: Secondary | ICD-10-CM | POA: Diagnosis not present

## 2014-03-02 DIAGNOSIS — IMO0002 Reserved for concepts with insufficient information to code with codable children: Secondary | ICD-10-CM | POA: Diagnosis not present

## 2014-03-02 DIAGNOSIS — M25559 Pain in unspecified hip: Secondary | ICD-10-CM | POA: Diagnosis not present

## 2014-03-02 DIAGNOSIS — M5126 Other intervertebral disc displacement, lumbar region: Secondary | ICD-10-CM | POA: Diagnosis not present

## 2014-03-03 DIAGNOSIS — IMO0002 Reserved for concepts with insufficient information to code with codable children: Secondary | ICD-10-CM | POA: Diagnosis not present

## 2014-03-03 DIAGNOSIS — M25559 Pain in unspecified hip: Secondary | ICD-10-CM | POA: Diagnosis not present

## 2014-03-03 DIAGNOSIS — M5137 Other intervertebral disc degeneration, lumbosacral region: Secondary | ICD-10-CM | POA: Diagnosis not present

## 2014-03-03 DIAGNOSIS — M999 Biomechanical lesion, unspecified: Secondary | ICD-10-CM | POA: Diagnosis not present

## 2014-03-03 DIAGNOSIS — M5126 Other intervertebral disc displacement, lumbar region: Secondary | ICD-10-CM | POA: Diagnosis not present

## 2014-03-08 DIAGNOSIS — M5126 Other intervertebral disc displacement, lumbar region: Secondary | ICD-10-CM | POA: Diagnosis not present

## 2014-03-08 DIAGNOSIS — M999 Biomechanical lesion, unspecified: Secondary | ICD-10-CM | POA: Diagnosis not present

## 2014-03-08 DIAGNOSIS — M5137 Other intervertebral disc degeneration, lumbosacral region: Secondary | ICD-10-CM | POA: Diagnosis not present

## 2014-03-08 DIAGNOSIS — M25559 Pain in unspecified hip: Secondary | ICD-10-CM | POA: Diagnosis not present

## 2014-03-08 DIAGNOSIS — IMO0002 Reserved for concepts with insufficient information to code with codable children: Secondary | ICD-10-CM | POA: Diagnosis not present

## 2014-03-09 DIAGNOSIS — M999 Biomechanical lesion, unspecified: Secondary | ICD-10-CM | POA: Diagnosis not present

## 2014-03-09 DIAGNOSIS — IMO0002 Reserved for concepts with insufficient information to code with codable children: Secondary | ICD-10-CM | POA: Diagnosis not present

## 2014-03-09 DIAGNOSIS — M5137 Other intervertebral disc degeneration, lumbosacral region: Secondary | ICD-10-CM | POA: Diagnosis not present

## 2014-03-09 DIAGNOSIS — M5380 Other specified dorsopathies, site unspecified: Secondary | ICD-10-CM | POA: Diagnosis not present

## 2014-03-09 DIAGNOSIS — M25559 Pain in unspecified hip: Secondary | ICD-10-CM | POA: Diagnosis not present

## 2014-03-09 DIAGNOSIS — M9981 Other biomechanical lesions of cervical region: Secondary | ICD-10-CM | POA: Diagnosis not present

## 2014-03-14 DIAGNOSIS — M5137 Other intervertebral disc degeneration, lumbosacral region: Secondary | ICD-10-CM | POA: Diagnosis not present

## 2014-03-14 DIAGNOSIS — M999 Biomechanical lesion, unspecified: Secondary | ICD-10-CM | POA: Diagnosis not present

## 2014-03-14 DIAGNOSIS — M9981 Other biomechanical lesions of cervical region: Secondary | ICD-10-CM | POA: Diagnosis not present

## 2014-03-14 DIAGNOSIS — IMO0002 Reserved for concepts with insufficient information to code with codable children: Secondary | ICD-10-CM | POA: Diagnosis not present

## 2014-03-14 DIAGNOSIS — M25559 Pain in unspecified hip: Secondary | ICD-10-CM | POA: Diagnosis not present

## 2014-03-14 DIAGNOSIS — M5380 Other specified dorsopathies, site unspecified: Secondary | ICD-10-CM | POA: Diagnosis not present

## 2014-03-15 DIAGNOSIS — M5137 Other intervertebral disc degeneration, lumbosacral region: Secondary | ICD-10-CM | POA: Diagnosis not present

## 2014-03-15 DIAGNOSIS — M999 Biomechanical lesion, unspecified: Secondary | ICD-10-CM | POA: Diagnosis not present

## 2014-03-15 DIAGNOSIS — M25559 Pain in unspecified hip: Secondary | ICD-10-CM | POA: Diagnosis not present

## 2014-03-15 DIAGNOSIS — M9981 Other biomechanical lesions of cervical region: Secondary | ICD-10-CM | POA: Diagnosis not present

## 2014-03-15 DIAGNOSIS — M5380 Other specified dorsopathies, site unspecified: Secondary | ICD-10-CM | POA: Diagnosis not present

## 2014-03-15 DIAGNOSIS — IMO0002 Reserved for concepts with insufficient information to code with codable children: Secondary | ICD-10-CM | POA: Diagnosis not present

## 2014-03-17 DIAGNOSIS — M9981 Other biomechanical lesions of cervical region: Secondary | ICD-10-CM | POA: Diagnosis not present

## 2014-03-17 DIAGNOSIS — IMO0002 Reserved for concepts with insufficient information to code with codable children: Secondary | ICD-10-CM | POA: Diagnosis not present

## 2014-03-17 DIAGNOSIS — M25559 Pain in unspecified hip: Secondary | ICD-10-CM | POA: Diagnosis not present

## 2014-03-17 DIAGNOSIS — M999 Biomechanical lesion, unspecified: Secondary | ICD-10-CM | POA: Diagnosis not present

## 2014-03-17 DIAGNOSIS — M5380 Other specified dorsopathies, site unspecified: Secondary | ICD-10-CM | POA: Diagnosis not present

## 2014-03-17 DIAGNOSIS — M5137 Other intervertebral disc degeneration, lumbosacral region: Secondary | ICD-10-CM | POA: Diagnosis not present

## 2014-03-24 DIAGNOSIS — Z23 Encounter for immunization: Secondary | ICD-10-CM | POA: Diagnosis not present

## 2014-03-24 DIAGNOSIS — R945 Abnormal results of liver function studies: Secondary | ICD-10-CM | POA: Diagnosis not present

## 2014-03-30 DIAGNOSIS — IMO0002 Reserved for concepts with insufficient information to code with codable children: Secondary | ICD-10-CM | POA: Diagnosis not present

## 2014-03-30 DIAGNOSIS — M25559 Pain in unspecified hip: Secondary | ICD-10-CM | POA: Diagnosis not present

## 2014-03-30 DIAGNOSIS — M999 Biomechanical lesion, unspecified: Secondary | ICD-10-CM | POA: Diagnosis not present

## 2014-03-30 DIAGNOSIS — M5137 Other intervertebral disc degeneration, lumbosacral region: Secondary | ICD-10-CM | POA: Diagnosis not present

## 2014-03-30 DIAGNOSIS — M5126 Other intervertebral disc displacement, lumbar region: Secondary | ICD-10-CM | POA: Diagnosis not present

## 2014-04-07 DIAGNOSIS — M9904 Segmental and somatic dysfunction of sacral region: Secondary | ICD-10-CM | POA: Diagnosis not present

## 2014-04-07 DIAGNOSIS — M9903 Segmental and somatic dysfunction of lumbar region: Secondary | ICD-10-CM | POA: Diagnosis not present

## 2014-04-07 DIAGNOSIS — M25559 Pain in unspecified hip: Secondary | ICD-10-CM | POA: Diagnosis not present

## 2014-04-07 DIAGNOSIS — M5408 Panniculitis affecting regions of neck and back, sacral and sacrococcygeal region: Secondary | ICD-10-CM | POA: Diagnosis not present

## 2014-04-07 DIAGNOSIS — M5136 Other intervertebral disc degeneration, lumbar region: Secondary | ICD-10-CM | POA: Diagnosis not present

## 2014-04-07 DIAGNOSIS — M9905 Segmental and somatic dysfunction of pelvic region: Secondary | ICD-10-CM | POA: Diagnosis not present

## 2014-04-07 DIAGNOSIS — M5414 Radiculopathy, thoracic region: Secondary | ICD-10-CM | POA: Diagnosis not present

## 2014-04-07 DIAGNOSIS — M9901 Segmental and somatic dysfunction of cervical region: Secondary | ICD-10-CM | POA: Diagnosis not present

## 2014-05-02 ENCOUNTER — Encounter: Payer: Self-pay | Admitting: Adult Health

## 2014-05-17 DIAGNOSIS — Z79899 Other long term (current) drug therapy: Secondary | ICD-10-CM | POA: Diagnosis not present

## 2014-05-18 ENCOUNTER — Encounter: Payer: Self-pay | Admitting: Neurology

## 2014-05-23 ENCOUNTER — Other Ambulatory Visit: Payer: Self-pay | Admitting: Internal Medicine

## 2014-05-23 DIAGNOSIS — Z6830 Body mass index (BMI) 30.0-30.9, adult: Secondary | ICD-10-CM | POA: Diagnosis not present

## 2014-05-23 DIAGNOSIS — R945 Abnormal results of liver function studies: Secondary | ICD-10-CM | POA: Diagnosis not present

## 2014-05-23 DIAGNOSIS — R7301 Impaired fasting glucose: Secondary | ICD-10-CM | POA: Diagnosis not present

## 2014-05-23 DIAGNOSIS — M549 Dorsalgia, unspecified: Secondary | ICD-10-CM | POA: Diagnosis not present

## 2014-05-23 DIAGNOSIS — R569 Unspecified convulsions: Secondary | ICD-10-CM | POA: Diagnosis not present

## 2014-05-23 DIAGNOSIS — I10 Essential (primary) hypertension: Secondary | ICD-10-CM | POA: Diagnosis not present

## 2014-05-23 DIAGNOSIS — M545 Low back pain: Secondary | ICD-10-CM

## 2014-05-24 ENCOUNTER — Encounter: Payer: Self-pay | Admitting: Neurology

## 2014-06-08 ENCOUNTER — Ambulatory Visit
Admission: RE | Admit: 2014-06-08 | Discharge: 2014-06-08 | Disposition: A | Discharge status: Home / Self Care | Payer: Medicare Other | Source: Ambulatory Visit | Attending: Internal Medicine | Admitting: Internal Medicine

## 2014-06-08 DIAGNOSIS — M545 Low back pain: Secondary | ICD-10-CM

## 2014-06-08 DIAGNOSIS — M4806 Spinal stenosis, lumbar region: Secondary | ICD-10-CM | POA: Diagnosis not present

## 2014-06-08 DIAGNOSIS — M5137 Other intervertebral disc degeneration, lumbosacral region: Secondary | ICD-10-CM | POA: Diagnosis not present

## 2014-06-08 DIAGNOSIS — M47816 Spondylosis without myelopathy or radiculopathy, lumbar region: Secondary | ICD-10-CM | POA: Diagnosis not present

## 2014-06-14 DIAGNOSIS — M545 Low back pain: Secondary | ICD-10-CM | POA: Diagnosis not present

## 2014-06-14 DIAGNOSIS — I10 Essential (primary) hypertension: Secondary | ICD-10-CM | POA: Diagnosis not present

## 2014-06-14 DIAGNOSIS — Z6831 Body mass index (BMI) 31.0-31.9, adult: Secondary | ICD-10-CM | POA: Diagnosis not present

## 2014-06-15 DIAGNOSIS — M7061 Trochanteric bursitis, right hip: Secondary | ICD-10-CM | POA: Diagnosis not present

## 2014-06-17 DIAGNOSIS — H04123 Dry eye syndrome of bilateral lacrimal glands: Secondary | ICD-10-CM | POA: Diagnosis not present

## 2014-06-17 DIAGNOSIS — H3531 Nonexudative age-related macular degeneration: Secondary | ICD-10-CM | POA: Diagnosis not present

## 2014-06-17 DIAGNOSIS — H2513 Age-related nuclear cataract, bilateral: Secondary | ICD-10-CM | POA: Diagnosis not present

## 2014-06-17 DIAGNOSIS — H25013 Cortical age-related cataract, bilateral: Secondary | ICD-10-CM | POA: Diagnosis not present

## 2014-07-21 DIAGNOSIS — M5416 Radiculopathy, lumbar region: Secondary | ICD-10-CM | POA: Diagnosis not present

## 2014-08-01 ENCOUNTER — Other Ambulatory Visit: Payer: Self-pay | Admitting: Neurosurgery

## 2014-08-01 DIAGNOSIS — M545 Low back pain: Secondary | ICD-10-CM | POA: Diagnosis not present

## 2014-08-01 DIAGNOSIS — I10 Essential (primary) hypertension: Secondary | ICD-10-CM | POA: Diagnosis not present

## 2014-08-01 DIAGNOSIS — Z6831 Body mass index (BMI) 31.0-31.9, adult: Secondary | ICD-10-CM | POA: Diagnosis not present

## 2014-08-01 DIAGNOSIS — M5416 Radiculopathy, lumbar region: Secondary | ICD-10-CM | POA: Diagnosis not present

## 2014-08-04 ENCOUNTER — Ambulatory Visit
Admission: RE | Admit: 2014-08-04 | Discharge: 2014-08-04 | Disposition: A | Discharge status: Home / Self Care | Payer: Medicare Other | Source: Ambulatory Visit | Attending: Neurosurgery | Admitting: Neurosurgery

## 2014-08-04 DIAGNOSIS — M5416 Radiculopathy, lumbar region: Secondary | ICD-10-CM

## 2014-08-04 DIAGNOSIS — M5137 Other intervertebral disc degeneration, lumbosacral region: Secondary | ICD-10-CM | POA: Diagnosis not present

## 2014-08-04 DIAGNOSIS — M4806 Spinal stenosis, lumbar region: Secondary | ICD-10-CM | POA: Diagnosis not present

## 2014-08-04 DIAGNOSIS — M5126 Other intervertebral disc displacement, lumbar region: Secondary | ICD-10-CM | POA: Diagnosis not present

## 2014-08-04 DIAGNOSIS — M47817 Spondylosis without myelopathy or radiculopathy, lumbosacral region: Secondary | ICD-10-CM | POA: Diagnosis not present

## 2014-08-04 MED ORDER — DIAZEPAM 5 MG PO TABS
10.0000 mg | ORAL_TABLET | Freq: Once | ORAL | Status: AC
  Administered 2014-08-04: 10 mg via ORAL

## 2014-08-04 MED ORDER — ONDANSETRON HCL 4 MG/2ML IJ SOLN: 4.0000 mg | Freq: Four times a day (QID) | INTRAMUSCULAR | Status: DC | PRN

## 2014-08-04 MED ORDER — ONDANSETRON HCL 4 MG/2ML IJ SOLN
4.0000 mg | Freq: Once | INTRAMUSCULAR | Status: AC
  Administered 2014-08-04: 4 mg via INTRAMUSCULAR

## 2014-08-04 MED ORDER — MEPERIDINE HCL 100 MG/ML IJ SOLN
75.0000 mg | Freq: Once | INTRAMUSCULAR | Status: AC
  Administered 2014-08-04: 75 mg via INTRAMUSCULAR

## 2014-08-04 MED ORDER — IOHEXOL 180 MG/ML  SOLN: 15.0000 mL | Freq: Once | INTRAMUSCULAR | Status: AC | PRN

## 2014-08-04 NOTE — Progress Notes (Signed)
Patient states she has been off Tramadol for at least the past two days.  Jadyn Barge, RN 

## 2014-08-04 NOTE — Discharge Instructions (Signed)
Myelogram Discharge Instructions  1. Go home and rest quietly for the next 24 hours.  It is important to lie flat for the next 24 hours.  Get up only to go to the restroom.  You may lie in the bed or on a couch on your back, your stomach, your left side or your right side.  You may have one pillow under your head.  You may have pillows between your knees while you are on your side or under your knees while you are on your back.  2. DO NOT drive today.  Recline the seat as far back as it will go, while still wearing your seat belt, on the way home.  3. You may get up to go to the bathroom as needed.  You may sit up for 10 minutes to eat.  You may resume your normal diet and medications unless otherwise indicated.  Drink lots of extra fluids today and tomorrow.  4. The incidence of headache, nausea, or vomiting is about 5% (one in 20 patients).  If you develop a headache, lie flat and drink plenty of fluids until the headache goes away.  Caffeinated beverages may be helpful.  If you develop severe nausea and vomiting or a headache that does not go away with flat bed rest, call [phone].  5. You may resume normal activities after your 24 hours of bed rest is over; however, do not exert yourself strongly or do any heavy lifting tomorrow. If when you get up you have a headache when standing, go back to bed and force fluids for another 24 hours.  6. Call your physician for a follow-up appointment.  The results of your myelogram will be sent directly to your physician by the following day.  7. If you have any questions or if complications develop after you arrive home, please call [phone].  Discharge instructions have been explained to the patient.  The patient, or the person responsible for the patient, fully understands these instructions.      May resume Tramadol on Feb. 5, 2016, after 8:30 am.

## 2014-08-05 ENCOUNTER — Telehealth: Payer: Self-pay | Admitting: *Deleted

## 2014-08-05 NOTE — Telephone Encounter (Signed)
Called and spoke to patient about rescheduled appointment

## 2014-08-08 DIAGNOSIS — M5416 Radiculopathy, lumbar region: Secondary | ICD-10-CM | POA: Diagnosis not present

## 2014-08-16 DIAGNOSIS — Z6831 Body mass index (BMI) 31.0-31.9, adult: Secondary | ICD-10-CM | POA: Diagnosis not present

## 2014-08-16 DIAGNOSIS — M7061 Trochanteric bursitis, right hip: Secondary | ICD-10-CM | POA: Diagnosis not present

## 2014-08-18 DIAGNOSIS — M7061 Trochanteric bursitis, right hip: Secondary | ICD-10-CM | POA: Diagnosis not present

## 2014-08-28 ENCOUNTER — Other Ambulatory Visit: Payer: Self-pay | Admitting: Adult Health

## 2014-09-02 ENCOUNTER — Other Ambulatory Visit: Payer: Self-pay | Admitting: Neurological Surgery

## 2014-09-02 DIAGNOSIS — M7061 Trochanteric bursitis, right hip: Secondary | ICD-10-CM

## 2014-09-05 ENCOUNTER — Ambulatory Visit
Admission: RE | Admit: 2014-09-05 | Discharge: 2014-09-05 | Disposition: A | Discharge status: Home / Self Care | Payer: Medicare Other | Source: Ambulatory Visit | Attending: Neurological Surgery | Admitting: Neurological Surgery

## 2014-09-05 DIAGNOSIS — M7061 Trochanteric bursitis, right hip: Secondary | ICD-10-CM | POA: Diagnosis not present

## 2014-09-05 DIAGNOSIS — M25551 Pain in right hip: Secondary | ICD-10-CM | POA: Diagnosis not present

## 2014-09-05 MED ORDER — IOHEXOL 180 MG/ML  SOLN
1.0000 mL | Freq: Once | INTRAMUSCULAR | Status: AC | PRN
  Administered 2014-09-05: 1 mL via INTRA_ARTICULAR

## 2014-09-05 MED ORDER — METHYLPREDNISOLONE ACETATE 40 MG/ML INJ SUSP (RADIOLOG
120.0000 mg | Freq: Once | INTRAMUSCULAR | Status: AC
  Administered 2014-09-05: 120 mg via INTRA_ARTICULAR

## 2014-09-14 DIAGNOSIS — E785 Hyperlipidemia, unspecified: Secondary | ICD-10-CM | POA: Diagnosis not present

## 2014-09-14 DIAGNOSIS — R569 Unspecified convulsions: Secondary | ICD-10-CM | POA: Diagnosis not present

## 2014-09-20 DIAGNOSIS — I10 Essential (primary) hypertension: Secondary | ICD-10-CM | POA: Diagnosis not present

## 2014-09-20 DIAGNOSIS — M545 Low back pain: Secondary | ICD-10-CM | POA: Diagnosis not present

## 2014-09-22 DIAGNOSIS — R7301 Impaired fasting glucose: Secondary | ICD-10-CM | POA: Diagnosis not present

## 2014-09-22 DIAGNOSIS — E876 Hypokalemia: Secondary | ICD-10-CM | POA: Diagnosis not present

## 2014-09-22 DIAGNOSIS — Z6831 Body mass index (BMI) 31.0-31.9, adult: Secondary | ICD-10-CM | POA: Diagnosis not present

## 2014-09-22 DIAGNOSIS — E785 Hyperlipidemia, unspecified: Secondary | ICD-10-CM | POA: Diagnosis not present

## 2014-09-22 DIAGNOSIS — Z1389 Encounter for screening for other disorder: Secondary | ICD-10-CM | POA: Diagnosis not present

## 2014-09-22 DIAGNOSIS — S32050S Wedge compression fracture of fifth lumbar vertebra, sequela: Secondary | ICD-10-CM | POA: Diagnosis not present

## 2014-09-22 DIAGNOSIS — I10 Essential (primary) hypertension: Secondary | ICD-10-CM | POA: Diagnosis not present

## 2014-09-22 DIAGNOSIS — R569 Unspecified convulsions: Secondary | ICD-10-CM | POA: Diagnosis not present

## 2014-09-30 ENCOUNTER — Other Ambulatory Visit: Payer: Self-pay | Admitting: Neurological Surgery

## 2014-09-30 DIAGNOSIS — M545 Low back pain: Secondary | ICD-10-CM

## 2014-10-04 ENCOUNTER — Ambulatory Visit
Admission: RE | Admit: 2014-10-04 | Discharge: 2014-10-04 | Disposition: A | Discharge status: Home / Self Care | Payer: Medicare Other | Source: Ambulatory Visit | Attending: Neurological Surgery | Admitting: Neurological Surgery

## 2014-10-04 DIAGNOSIS — M47817 Spondylosis without myelopathy or radiculopathy, lumbosacral region: Secondary | ICD-10-CM | POA: Diagnosis not present

## 2014-10-04 DIAGNOSIS — M545 Low back pain: Secondary | ICD-10-CM | POA: Diagnosis not present

## 2014-10-04 MED ORDER — IOHEXOL 180 MG/ML  SOLN
1.0000 mL | Freq: Once | INTRAMUSCULAR | Status: AC | PRN
  Administered 2014-10-04: 1 mL via INTRA_ARTICULAR

## 2014-10-04 MED ORDER — METHYLPREDNISOLONE ACETATE 40 MG/ML INJ SUSP (RADIOLOG
120.0000 mg | Freq: Once | INTRAMUSCULAR | Status: AC
Start: 2014-10-04 — End: 2014-10-04
  Administered 2014-10-04: 120 mg via INTRA_ARTICULAR

## 2014-10-04 NOTE — Discharge Instructions (Signed)

## 2014-10-30 ENCOUNTER — Other Ambulatory Visit: Payer: Self-pay | Admitting: Adult Health

## 2014-11-22 ENCOUNTER — Other Ambulatory Visit: Payer: Self-pay | Admitting: Neurosurgery

## 2014-11-22 DIAGNOSIS — M25551 Pain in right hip: Secondary | ICD-10-CM

## 2014-12-01 ENCOUNTER — Other Ambulatory Visit: Payer: Medicare Other

## 2014-12-05 ENCOUNTER — Ambulatory Visit: Payer: Medicare Other | Admitting: Adult Health

## 2014-12-06 ENCOUNTER — Ambulatory Visit
Admission: RE | Admit: 2014-12-06 | Discharge: 2014-12-06 | Disposition: A | Discharge status: Home / Self Care | Payer: Medicare Other | Source: Ambulatory Visit | Attending: Neurosurgery | Admitting: Neurosurgery

## 2014-12-06 DIAGNOSIS — M7061 Trochanteric bursitis, right hip: Secondary | ICD-10-CM | POA: Diagnosis not present

## 2014-12-06 DIAGNOSIS — M25551 Pain in right hip: Secondary | ICD-10-CM

## 2014-12-06 MED ORDER — IOHEXOL 180 MG/ML  SOLN: 1.0000 mL | Freq: Once | INTRAMUSCULAR | Status: AC | PRN

## 2014-12-06 MED ORDER — METHYLPREDNISOLONE ACETATE 40 MG/ML INJ SUSP (RADIOLOG: 120.0000 mg | Freq: Once | INTRAMUSCULAR | Status: DC

## 2014-12-28 ENCOUNTER — Other Ambulatory Visit: Payer: Self-pay | Admitting: Neurological Surgery

## 2014-12-28 DIAGNOSIS — M545 Low back pain: Secondary | ICD-10-CM

## 2015-01-03 DIAGNOSIS — Z6832 Body mass index (BMI) 32.0-32.9, adult: Secondary | ICD-10-CM | POA: Diagnosis not present

## 2015-01-03 DIAGNOSIS — M5416 Radiculopathy, lumbar region: Secondary | ICD-10-CM | POA: Diagnosis not present

## 2015-01-04 ENCOUNTER — Encounter: Payer: Self-pay | Admitting: Adult Health

## 2015-01-04 ENCOUNTER — Ambulatory Visit (INDEPENDENT_AMBULATORY_CARE_PROVIDER_SITE_OTHER): Payer: Medicare Other | Admitting: Adult Health

## 2015-01-04 VITALS — BP 108/64 | HR 77 | Ht <= 58 in | Wt 140.0 lb

## 2015-01-04 DIAGNOSIS — R569 Unspecified convulsions: Secondary | ICD-10-CM | POA: Diagnosis not present

## 2015-01-04 DIAGNOSIS — Q763 Congenital scoliosis due to congenital bony malformation: Secondary | ICD-10-CM

## 2015-01-04 DIAGNOSIS — Q7649 Other congenital malformations of spine, not associated with scoliosis: Secondary | ICD-10-CM

## 2015-01-04 MED ORDER — TOPIRAMATE 50 MG PO TABS: ORAL_TABLET | ORAL | Status: DC

## 2015-01-04 MED ORDER — GABAPENTIN 800 MG PO TABS: 800.0000 mg | ORAL_TABLET | Freq: Every day | ORAL | Status: DC

## 2015-01-04 MED ORDER — PHENOBARBITAL 97.2 MG PO TABS: 145.8000 mg | ORAL_TABLET | Freq: Every day | ORAL | Status: DC

## 2015-01-04 NOTE — Patient Instructions (Signed)
Continue gabapentin, phenobarbital and topamax.  If you have a seizure event please let us know.

## 2015-01-04 NOTE — Progress Notes (Signed)
PATIENT: Amber Bennett DOB: [birth year]  REASON FOR VISIT: follow up- seizures  HISTORY FROM: patient  HISTORY OF PRESENT ILLNESS: Amber Bennett is a 64 year old female with a history of seizures. She returns today for follow-up. The patient continues to take phenobarbital and Topamax and tolerating it well. She denies any seizure events. She is able to complete all ADLs independently. She operates a Teacher, music without difficulty. Denies any trouble with her cognition. No changes in her gait or balance. Patient has ongoing back pain. She is followed by Dr. Ronnald Ramp. According to the patient he has recommended a very extensive surgery. She is going to have a second opinion with Dr. Harl Bowie at [facility]. For now she continues to use the gabapentin for nerve related pain. She states this works well. She returns today for an evaluation.  HISTORY 12/01/13: Amber Bennett is a 52- year-old right-handed white female with a history of seizures. She returns today for follow-up. The patient currently takes phenobarbital and Topamax and is tolerating it well. The patient also has dermatomyositis and is currently taking Imuran. She reports that her last seizure was 3 years ago and it was contributed to sleep deprivation. In the past, patient had back surgery due to sciatic pain on the left side. She reports that she is now having pain on the right side. She is trying to exercise and strengthen her back. She states that if it gets worse she will visit Dr. Ronnald Ramp who did her last surgery. Patient recently had blood work in November by her PCP and her PCP will draw them again in July.   REVIEW OF SYSTEMS: Out of a complete 14 system review of symptoms, the patient complains only of the following symptoms, and all other reviewed systems are negative.  Activity change, light sensitivity  ALLERGIES: Allergies  Allergen Reactions  . Tylenol [Acetaminophen] Swelling and Other (See Comments)    Throat swelling  . Latex  Itching    HOME MEDICATIONS: Outpatient Prescriptions Prior to Visit  Medication Sig Dispense Refill  . alendronate (FOSAMAX) 70 MG tablet     . azaTHIOprine (IMURAN) 50 MG tablet Take 50 mg by mouth daily.    . Calcium Carbonate-Vitamin D (CALCIUM + D PO) Take by mouth daily.    . Coenzyme Q10 (CO Q 10 PO) Take 100 mg by mouth daily.    . Cyanocobalamin (VITAMIN B 12 PO) Take 1,000 mg by mouth daily.    . fluticasone (FLONASE) 50 MCG/ACT nasal spray daily.    Marland Kitchen gabapentin (NEURONTIN) 800 MG tablet TAKE 1 TABLET BY MOUTH TWICE DAILY 180 tablet 3  . montelukast (SINGULAIR) 10 MG tablet Take 10 mg by mouth daily.    . Multiple Vitamins-Minerals (MULTIVITAMIN PO) Take by mouth daily.    Marland Kitchen omega-3 acid ethyl esters (LOVAZA) 1 G capsule Take 1 capsule by mouth 4 (four) times daily.     Marland Kitchen PHENobarbital (LUMINAL) 97.2 MG tablet TAKE 1 AND 1/2 TABLETS BY MOUTH EVERY DAY 135 tablet 1  . potassium chloride (K-DUR,KLOR-CON) 10 MEQ tablet Take 10 mEq by mouth daily.    Marland Kitchen telmisartan (MICARDIS) 40 MG tablet     . topiramate (TOPAMAX) 100 MG tablet Take 1 tablet (100 mg total) by mouth every evening. 90 tablet 3  . topiramate (TOPAMAX) 50 MG tablet Take 1 tablet (50 mg total) by mouth every morning. 90 tablet 3  . triamterene-hydrochlorothiazide (MAXZIDE) 75-50 MG per tablet Take by mouth daily. 1/2 tablet    .  ZETIA 10 MG tablet Take 10 mg by mouth daily.     No facility-administered medications prior to visit.    PAST MEDICAL HISTORY: Past Medical History  Diagnosis Date  . Seizures   . Lower back pain   . HTN (hypertension)   . High cholesterol   . Osteopenia     Left side hip  . Cervical radiculitis   . Macular degeneration   . Carpal tunnel syndrome   . Hematuria     negative evaluation  . Elevated liver enzymes   . Dermatomyositis     PAST SURGICAL HISTORY: Past Surgical History  Procedure Laterality Date  . Tonsillectomy    . Appendectomy    . Back surgery    .  Cholecystectomy  2003  . Shoulder surgery Right 2000  . Abdominal hysterectomy  1982  . Exploratory laparotomy    . Carpal tunnel release    . Laminectomy      FAMILY HISTORY: Family History  Problem Relation Age of Onset  . Cancer Mother     Cancer of the bone marrow  . Stroke Sister   . Hypertension Sister   . Diabetes Sister          PHYSICAL EXAM  Filed Vitals:   01/04/15 1021  BP: 108/64  Pulse: 77  Height: 4\' 10"  (1.473 m)  Weight: 140 lb (63.504 kg)   Body mass index is 29.27 kg/(m^2).  Generalized: Well developed, in no acute distress   Neurological examination  Mentation: Alert oriented to time, place, history taking. Follows all commands speech and language fluent Cranial nerve II-XII: Pupils were equal round reactive to light. Extraocular movements were full, visual field were full on confrontational test. Facial sensation and strength were normal. Uvula tongue midline. Head turning and shoulder shrug  were normal and symmetric. Motor: The motor testing reveals 5 over 5 strength of all 4 extremities. Good symmetric motor tone is noted throughout.  Sensory: Sensory testing is intact to soft touch on all 4 extremities. No evidence of extinction is noted.  Coordination: Cerebellar testing reveals good finger-nose-finger and heel-to-shin bilaterally.  Gait and station: Gait is normal. Tandem gait is normal. Romberg is negative. No drift is seen.  Reflexes: Deep tendon reflexes are symmetric and normal bilaterally.    DIAGNOSTIC DATA (LABS, IMAGING, TESTING) - I reviewed patient records, labs, notes, testing and imaging myself where available.     ASSESSMENT AND PLAN 64 y.o. year old female  has a past medical history of Seizures; Lower back pain; HTN (hypertension); High cholesterol; Osteopenia; Cervical radiculitis; Macular degeneration; Carpal tunnel syndrome; Hematuria; Elevated liver enzymes; and Dermatomyositis. here with:  1. Seizures 2.  Bertolotti's syndrome  Overall the patient is doing well.  She will continue taking phenobarbital and Topamax, no refills needed today. She recently had blood work with her PCP. Patient advised that if she has a seizure event she should let us know. She will follow-up in one year or sooner if needed.   Ward Givens, MSN, NP-C 01/04/2015, 10:16 AM [facility] [address] [place], [place] [address] [phone]  Note: This document was prepared with digital dictation and possible smart phrase technology. Any transcriptional errors that result from this process are unintentional.

## 2015-01-05 ENCOUNTER — Telehealth: Payer: Self-pay

## 2015-01-05 ENCOUNTER — Other Ambulatory Visit: Payer: Self-pay | Admitting: Adult Health

## 2015-01-05 ENCOUNTER — Telehealth: Payer: Self-pay | Admitting: Adult Health

## 2015-01-05 NOTE — Telephone Encounter (Signed)
Pt called stating that Ward Givens called and left a message to call back . Please call at [phone]

## 2015-01-05 NOTE — Telephone Encounter (Signed)
Patient indicated the cost of Phenobarb has increased.  I called the pharmacy.  They said a 90 day Rx was last filled in May, and co-pay was $112.  They recommended the patient contact her ins plan, as they determine the pricing.  They feel either her formulary or deductible may have changed, or she could be in the donut hole.  I went online to goodrx.com, and found that the patient may use a voucher to get this medication for $32 per fill if desired.  (Member ID [id number] RxGroup [id number] RxBin [id number] [place] [id number])  I called the patient.  Got no answer.  Left message relaying this info.

## 2015-01-05 NOTE — Telephone Encounter (Signed)
Called patient back and relayed Amber Bennett left you a message gave her instructions again she understood . Patient will call back if she has any problems see previous message.

## 2015-01-15 ENCOUNTER — Other Ambulatory Visit: Payer: Self-pay | Admitting: Neurology

## 2015-01-30 DIAGNOSIS — M47892 Other spondylosis, cervical region: Secondary | ICD-10-CM | POA: Diagnosis not present

## 2015-01-30 DIAGNOSIS — M5441 Lumbago with sciatica, right side: Secondary | ICD-10-CM | POA: Diagnosis not present

## 2015-01-30 DIAGNOSIS — M545 Low back pain: Secondary | ICD-10-CM | POA: Diagnosis not present

## 2015-01-30 DIAGNOSIS — M5410 Radiculopathy, site unspecified: Secondary | ICD-10-CM | POA: Diagnosis not present

## 2015-01-30 DIAGNOSIS — M5137 Other intervertebral disc degeneration, lumbosacral region: Secondary | ICD-10-CM | POA: Diagnosis not present

## 2015-01-30 DIAGNOSIS — M47896 Other spondylosis, lumbar region: Secondary | ICD-10-CM | POA: Diagnosis not present

## 2015-02-02 NOTE — Progress Notes (Signed)
I have read the note, and I agree with the clinical assessment and plan.  Offie Waide A. Felecia Shelling, MD, PhD

## 2015-02-08 DIAGNOSIS — M4806 Spinal stenosis, lumbar region: Secondary | ICD-10-CM | POA: Diagnosis not present

## 2015-02-08 DIAGNOSIS — M5136 Other intervertebral disc degeneration, lumbar region: Secondary | ICD-10-CM | POA: Diagnosis not present

## 2015-02-15 DIAGNOSIS — R569 Unspecified convulsions: Secondary | ICD-10-CM | POA: Diagnosis not present

## 2015-02-15 DIAGNOSIS — R7301 Impaired fasting glucose: Secondary | ICD-10-CM | POA: Diagnosis not present

## 2015-02-15 DIAGNOSIS — R8299 Other abnormal findings in urine: Secondary | ICD-10-CM | POA: Diagnosis not present

## 2015-02-15 DIAGNOSIS — M859 Disorder of bone density and structure, unspecified: Secondary | ICD-10-CM | POA: Diagnosis not present

## 2015-02-15 DIAGNOSIS — E785 Hyperlipidemia, unspecified: Secondary | ICD-10-CM | POA: Diagnosis not present

## 2015-02-15 DIAGNOSIS — I10 Essential (primary) hypertension: Secondary | ICD-10-CM | POA: Diagnosis not present

## 2015-02-16 DIAGNOSIS — K219 Gastro-esophageal reflux disease without esophagitis: Secondary | ICD-10-CM | POA: Diagnosis not present

## 2015-02-16 DIAGNOSIS — E785 Hyperlipidemia, unspecified: Secondary | ICD-10-CM | POA: Diagnosis not present

## 2015-02-16 DIAGNOSIS — M4806 Spinal stenosis, lumbar region: Secondary | ICD-10-CM | POA: Diagnosis not present

## 2015-02-16 DIAGNOSIS — I10 Essential (primary) hypertension: Secondary | ICD-10-CM | POA: Diagnosis not present

## 2015-02-16 DIAGNOSIS — M5441 Lumbago with sciatica, right side: Secondary | ICD-10-CM | POA: Diagnosis not present

## 2015-02-16 DIAGNOSIS — M8580 Other specified disorders of bone density and structure, unspecified site: Secondary | ICD-10-CM | POA: Diagnosis not present

## 2015-02-16 DIAGNOSIS — M7138 Other bursal cyst, other site: Secondary | ICD-10-CM | POA: Diagnosis not present

## 2015-02-16 DIAGNOSIS — Z01810 Encounter for preprocedural cardiovascular examination: Secondary | ICD-10-CM | POA: Diagnosis not present

## 2015-02-20 DIAGNOSIS — M4806 Spinal stenosis, lumbar region: Secondary | ICD-10-CM | POA: Diagnosis not present

## 2015-02-20 DIAGNOSIS — I10 Essential (primary) hypertension: Secondary | ICD-10-CM | POA: Diagnosis not present

## 2015-02-20 DIAGNOSIS — E785 Hyperlipidemia, unspecified: Secondary | ICD-10-CM | POA: Diagnosis not present

## 2015-02-20 DIAGNOSIS — K219 Gastro-esophageal reflux disease without esophagitis: Secondary | ICD-10-CM | POA: Diagnosis not present

## 2015-02-20 DIAGNOSIS — M7138 Other bursal cyst, other site: Secondary | ICD-10-CM | POA: Diagnosis not present

## 2015-02-20 DIAGNOSIS — M5441 Lumbago with sciatica, right side: Secondary | ICD-10-CM | POA: Diagnosis not present

## 2015-02-27 DIAGNOSIS — Z1212 Encounter for screening for malignant neoplasm of rectum: Secondary | ICD-10-CM | POA: Diagnosis not present

## 2015-02-28 ENCOUNTER — Telehealth: Payer: Self-pay | Admitting: Neurology

## 2015-02-28 DIAGNOSIS — M3390 Dermatopolymyositis, unspecified, organ involvement unspecified: Secondary | ICD-10-CM | POA: Diagnosis not present

## 2015-02-28 DIAGNOSIS — I700 Atherosclerosis of aorta: Secondary | ICD-10-CM | POA: Diagnosis not present

## 2015-02-28 DIAGNOSIS — I440 Atrioventricular block, first degree: Secondary | ICD-10-CM | POA: Diagnosis not present

## 2015-02-28 DIAGNOSIS — M859 Disorder of bone density and structure, unspecified: Secondary | ICD-10-CM | POA: Diagnosis not present

## 2015-02-28 DIAGNOSIS — Z0000 Encounter for general adult medical examination without abnormal findings: Secondary | ICD-10-CM | POA: Diagnosis not present

## 2015-02-28 DIAGNOSIS — Z1389 Encounter for screening for other disorder: Secondary | ICD-10-CM | POA: Diagnosis not present

## 2015-02-28 DIAGNOSIS — I10 Essential (primary) hypertension: Secondary | ICD-10-CM | POA: Diagnosis not present

## 2015-02-28 DIAGNOSIS — Z6832 Body mass index (BMI) 32.0-32.9, adult: Secondary | ICD-10-CM | POA: Diagnosis not present

## 2015-02-28 DIAGNOSIS — Z23 Encounter for immunization: Secondary | ICD-10-CM | POA: Diagnosis not present

## 2015-02-28 DIAGNOSIS — R312 Other microscopic hematuria: Secondary | ICD-10-CM | POA: Diagnosis not present

## 2015-02-28 DIAGNOSIS — E876 Hypokalemia: Secondary | ICD-10-CM | POA: Diagnosis not present

## 2015-02-28 DIAGNOSIS — M549 Dorsalgia, unspecified: Secondary | ICD-10-CM | POA: Diagnosis not present

## 2015-02-28 DIAGNOSIS — S32050D Wedge compression fracture of fifth lumbar vertebra, subsequent encounter for fracture with routine healing: Secondary | ICD-10-CM | POA: Diagnosis not present

## 2015-02-28 NOTE — Telephone Encounter (Signed)
Bloodwork perceive from Dr. Joylene Draft reveals a sodium 131, potassium 3.2. Liver enzymes are elevated with an AST of 88, ALT 166. CBC is unremarkable with exception of an MCV of 101.6. TSH is 3.27, hemoglobin A1c 5.6. Phenobarbital level is 27. Liver enzymes are chronically elevated, but this panel is slightly higher than last year, we may at some point need to consider switching to a medication such as Vimpat or Keppra. The patient is on Imuran.

## 2015-03-27 DIAGNOSIS — M5416 Radiculopathy, lumbar region: Secondary | ICD-10-CM | POA: Diagnosis not present

## 2015-03-27 DIAGNOSIS — M545 Low back pain: Secondary | ICD-10-CM | POA: Diagnosis not present

## 2015-04-11 DIAGNOSIS — M549 Dorsalgia, unspecified: Secondary | ICD-10-CM | POA: Diagnosis not present

## 2015-04-14 DIAGNOSIS — M549 Dorsalgia, unspecified: Secondary | ICD-10-CM | POA: Diagnosis not present

## 2015-04-19 DIAGNOSIS — M79604 Pain in right leg: Secondary | ICD-10-CM | POA: Diagnosis not present

## 2015-04-19 DIAGNOSIS — Z4789 Encounter for other orthopedic aftercare: Secondary | ICD-10-CM | POA: Diagnosis not present

## 2015-04-19 DIAGNOSIS — M545 Low back pain: Secondary | ICD-10-CM | POA: Diagnosis not present

## 2015-04-19 DIAGNOSIS — Z87891 Personal history of nicotine dependence: Secondary | ICD-10-CM | POA: Diagnosis not present

## 2015-05-15 ENCOUNTER — Other Ambulatory Visit: Payer: Self-pay

## 2015-05-15 DIAGNOSIS — Z1231 Encounter for screening mammogram for malignant neoplasm of breast: Secondary | ICD-10-CM

## 2015-05-23 ENCOUNTER — Ambulatory Visit
Admission: RE | Admit: 2015-05-23 | Discharge: 2015-05-23 | Disposition: A | Discharge status: Home / Self Care | Payer: Medicare Other | Source: Ambulatory Visit

## 2015-05-23 DIAGNOSIS — Z1231 Encounter for screening mammogram for malignant neoplasm of breast: Secondary | ICD-10-CM

## 2015-06-08 DIAGNOSIS — R7301 Impaired fasting glucose: Secondary | ICD-10-CM | POA: Diagnosis not present

## 2015-06-08 DIAGNOSIS — E784 Other hyperlipidemia: Secondary | ICD-10-CM | POA: Diagnosis not present

## 2015-06-08 DIAGNOSIS — I10 Essential (primary) hypertension: Secondary | ICD-10-CM | POA: Diagnosis not present

## 2015-06-15 DIAGNOSIS — E784 Other hyperlipidemia: Secondary | ICD-10-CM | POA: Diagnosis not present

## 2015-06-15 DIAGNOSIS — I700 Atherosclerosis of aorta: Secondary | ICD-10-CM | POA: Diagnosis not present

## 2015-06-15 DIAGNOSIS — R569 Unspecified convulsions: Secondary | ICD-10-CM | POA: Diagnosis not present

## 2015-06-15 DIAGNOSIS — R7301 Impaired fasting glucose: Secondary | ICD-10-CM | POA: Diagnosis not present

## 2015-06-15 DIAGNOSIS — Z6832 Body mass index (BMI) 32.0-32.9, adult: Secondary | ICD-10-CM | POA: Diagnosis not present

## 2015-06-15 DIAGNOSIS — I10 Essential (primary) hypertension: Secondary | ICD-10-CM | POA: Diagnosis not present

## 2015-06-15 DIAGNOSIS — M549 Dorsalgia, unspecified: Secondary | ICD-10-CM | POA: Diagnosis not present

## 2015-06-15 DIAGNOSIS — E876 Hypokalemia: Secondary | ICD-10-CM | POA: Diagnosis not present

## 2015-06-27 DIAGNOSIS — I10 Essential (primary) hypertension: Secondary | ICD-10-CM | POA: Diagnosis not present

## 2015-06-28 DIAGNOSIS — M5126 Other intervertebral disc displacement, lumbar region: Secondary | ICD-10-CM | POA: Diagnosis not present

## 2015-06-28 DIAGNOSIS — Z9889 Other specified postprocedural states: Secondary | ICD-10-CM | POA: Diagnosis not present

## 2015-06-28 DIAGNOSIS — M5186 Other intervertebral disc disorders, lumbar region: Secondary | ICD-10-CM | POA: Diagnosis not present

## 2015-07-02 HISTORY — PX: BACK SURGERY: SHX140

## 2015-07-19 DIAGNOSIS — Z01818 Encounter for other preprocedural examination: Secondary | ICD-10-CM | POA: Diagnosis not present

## 2015-07-19 DIAGNOSIS — M5416 Radiculopathy, lumbar region: Secondary | ICD-10-CM | POA: Diagnosis not present

## 2015-07-27 DIAGNOSIS — S32000A Wedge compression fracture of unspecified lumbar vertebra, initial encounter for closed fracture: Secondary | ICD-10-CM | POA: Diagnosis not present

## 2015-07-27 DIAGNOSIS — M4856XG Collapsed vertebra, not elsewhere classified, lumbar region, subsequent encounter for fracture with delayed healing: Secondary | ICD-10-CM | POA: Diagnosis not present

## 2015-07-27 DIAGNOSIS — M961 Postlaminectomy syndrome, not elsewhere classified: Secondary | ICD-10-CM | POA: Diagnosis not present

## 2015-07-27 DIAGNOSIS — M5137 Other intervertebral disc degeneration, lumbosacral region: Secondary | ICD-10-CM | POA: Diagnosis not present

## 2015-07-27 DIAGNOSIS — Z87891 Personal history of nicotine dependence: Secondary | ICD-10-CM | POA: Diagnosis not present

## 2015-07-27 DIAGNOSIS — G40909 Epilepsy, unspecified, not intractable, without status epilepticus: Secondary | ICD-10-CM | POA: Diagnosis present

## 2015-07-27 DIAGNOSIS — M4856XA Collapsed vertebra, not elsewhere classified, lumbar region, initial encounter for fracture: Secondary | ICD-10-CM | POA: Diagnosis not present

## 2015-07-27 DIAGNOSIS — K219 Gastro-esophageal reflux disease without esophagitis: Secondary | ICD-10-CM | POA: Diagnosis present

## 2015-07-27 DIAGNOSIS — E7800 Pure hypercholesterolemia, unspecified: Secondary | ICD-10-CM | POA: Diagnosis present

## 2015-07-27 DIAGNOSIS — M8580 Other specified disorders of bone density and structure, unspecified site: Secondary | ICD-10-CM | POA: Diagnosis present

## 2015-07-27 DIAGNOSIS — R339 Retention of urine, unspecified: Secondary | ICD-10-CM | POA: Diagnosis not present

## 2015-07-27 DIAGNOSIS — I10 Essential (primary) hypertension: Secondary | ICD-10-CM | POA: Diagnosis present

## 2015-08-11 DIAGNOSIS — Z981 Arthrodesis status: Secondary | ICD-10-CM | POA: Diagnosis not present

## 2015-08-11 DIAGNOSIS — Z87891 Personal history of nicotine dependence: Secondary | ICD-10-CM | POA: Diagnosis not present

## 2015-08-11 DIAGNOSIS — Z4802 Encounter for removal of sutures: Secondary | ICD-10-CM | POA: Diagnosis not present

## 2015-08-11 DIAGNOSIS — Z4889 Encounter for other specified surgical aftercare: Secondary | ICD-10-CM | POA: Diagnosis not present

## 2015-08-14 ENCOUNTER — Other Ambulatory Visit: Payer: Self-pay | Admitting: Adult Health

## 2015-08-15 ENCOUNTER — Other Ambulatory Visit: Payer: Self-pay | Admitting: *Deleted

## 2015-08-15 ENCOUNTER — Other Ambulatory Visit: Payer: Self-pay | Admitting: Adult Health

## 2015-08-15 MED ORDER — PHENOBARBITAL 97.2 MG PO TABS: 145.8000 mg | ORAL_TABLET | Freq: Every day | ORAL | Status: DC

## 2015-08-15 NOTE — Telephone Encounter (Signed)
The 90 day prescription has been written.

## 2015-08-15 NOTE — Telephone Encounter (Addendum)
I spoke to pt and she would like the 90day supply vs 30 day.  Will forward to Dr. Jannifer Franklin. I shredded megans prescription for 30 day.

## 2015-08-28 DIAGNOSIS — Z87891 Personal history of nicotine dependence: Secondary | ICD-10-CM | POA: Diagnosis not present

## 2015-08-28 DIAGNOSIS — Z981 Arthrodesis status: Secondary | ICD-10-CM | POA: Diagnosis not present

## 2015-08-28 DIAGNOSIS — Z4802 Encounter for removal of sutures: Secondary | ICD-10-CM | POA: Diagnosis not present

## 2015-09-04 DIAGNOSIS — H2513 Age-related nuclear cataract, bilateral: Secondary | ICD-10-CM | POA: Diagnosis not present

## 2015-09-04 DIAGNOSIS — H353121 Nonexudative age-related macular degeneration, left eye, early dry stage: Secondary | ICD-10-CM | POA: Diagnosis not present

## 2015-09-04 DIAGNOSIS — Z0100 Encounter for examination of eyes and vision without abnormal findings: Secondary | ICD-10-CM | POA: Diagnosis not present

## 2015-09-04 DIAGNOSIS — H25013 Cortical age-related cataract, bilateral: Secondary | ICD-10-CM | POA: Diagnosis not present

## 2015-10-10 DIAGNOSIS — H25012 Cortical age-related cataract, left eye: Secondary | ICD-10-CM | POA: Diagnosis not present

## 2015-10-10 DIAGNOSIS — H2512 Age-related nuclear cataract, left eye: Secondary | ICD-10-CM | POA: Diagnosis not present

## 2015-10-10 DIAGNOSIS — H25812 Combined forms of age-related cataract, left eye: Secondary | ICD-10-CM | POA: Diagnosis not present

## 2015-10-17 DIAGNOSIS — E784 Other hyperlipidemia: Secondary | ICD-10-CM | POA: Diagnosis not present

## 2015-10-17 DIAGNOSIS — E876 Hypokalemia: Secondary | ICD-10-CM | POA: Diagnosis not present

## 2015-10-17 DIAGNOSIS — R7301 Impaired fasting glucose: Secondary | ICD-10-CM | POA: Diagnosis not present

## 2015-10-18 DIAGNOSIS — Z87891 Personal history of nicotine dependence: Secondary | ICD-10-CM | POA: Diagnosis not present

## 2015-10-18 DIAGNOSIS — Z981 Arthrodesis status: Secondary | ICD-10-CM | POA: Diagnosis not present

## 2015-10-18 DIAGNOSIS — Z4789 Encounter for other orthopedic aftercare: Secondary | ICD-10-CM | POA: Diagnosis not present

## 2015-10-19 DIAGNOSIS — I700 Atherosclerosis of aorta: Secondary | ICD-10-CM | POA: Diagnosis not present

## 2015-10-19 DIAGNOSIS — Z6832 Body mass index (BMI) 32.0-32.9, adult: Secondary | ICD-10-CM | POA: Diagnosis not present

## 2015-10-19 DIAGNOSIS — R569 Unspecified convulsions: Secondary | ICD-10-CM | POA: Diagnosis not present

## 2015-10-19 DIAGNOSIS — E784 Other hyperlipidemia: Secondary | ICD-10-CM | POA: Diagnosis not present

## 2015-10-19 DIAGNOSIS — I10 Essential (primary) hypertension: Secondary | ICD-10-CM | POA: Diagnosis not present

## 2015-10-19 DIAGNOSIS — R7301 Impaired fasting glucose: Secondary | ICD-10-CM | POA: Diagnosis not present

## 2015-10-19 DIAGNOSIS — M5489 Other dorsalgia: Secondary | ICD-10-CM | POA: Diagnosis not present

## 2015-10-19 DIAGNOSIS — E876 Hypokalemia: Secondary | ICD-10-CM | POA: Diagnosis not present

## 2015-11-07 DIAGNOSIS — H25011 Cortical age-related cataract, right eye: Secondary | ICD-10-CM | POA: Diagnosis not present

## 2015-11-07 DIAGNOSIS — H25811 Combined forms of age-related cataract, right eye: Secondary | ICD-10-CM | POA: Diagnosis not present

## 2015-11-07 DIAGNOSIS — H2511 Age-related nuclear cataract, right eye: Secondary | ICD-10-CM | POA: Diagnosis not present

## 2016-01-01 ENCOUNTER — Other Ambulatory Visit: Payer: Self-pay | Admitting: Adult Health

## 2016-01-10 ENCOUNTER — Ambulatory Visit (INDEPENDENT_AMBULATORY_CARE_PROVIDER_SITE_OTHER): Payer: Medicare Other | Admitting: Adult Health

## 2016-01-10 ENCOUNTER — Encounter: Payer: Self-pay | Admitting: Adult Health

## 2016-01-10 VITALS — BP 138/71 | HR 85 | Ht <= 58 in | Wt 156.2 lb

## 2016-01-10 DIAGNOSIS — R569 Unspecified convulsions: Secondary | ICD-10-CM | POA: Diagnosis not present

## 2016-01-10 NOTE — Progress Notes (Signed)
PATIENT: Amber Bennett DOB: August 10, 1950  REASON FOR VISIT: follow up HISTORY FROM: patient  HISTORY OF PRESENT ILLNESS: Amber Bennett is a 65 year old female with a history of seizures. She returns today for follow-up. She is currently on phenobarbital and Topamax. She reports that she is not had any seizure activity. She is able to complete all ADLs independently. She operates a Teacher, music without difficulty. Her liver enzymes are chronically elevated. She reports that her primary care feels that her liver enzymes may be contributed to her use of pain medication and NSAIDs. She states that she had back surgery in January and in the last month she is noticing great improvement. She states that she is not taking nearly the amount of medicine she was for discomfort.  She is also on Imuran. She denies any new neurological symptoms. She returns today for an evaluation. /H/ISTORY 01/04/15 (WILLIS): Amber Bennett is a 65 year old female with a history of seizures. She returns today for follow-up. The patient continues to take phenobarbital and Topamax and tolerating it well. She denies any seizure events. She is able to complete all ADLs independently. She operates a Teacher, music without difficulty. Denies any trouble with her cognition. No changes in her gait or balance. Patient has ongoing back pain. She is followed by Dr. Ronnald Ramp. According to the patient he has recommended a very extensive surgery. She is going to have a second opinion with Dr. Harl Bowie at [facility]. For now she continues to use the gabapentin for nerve related pain. She states this works well. She returns today for an evaluation.  HISTORY 12/01/13: Amber Bennett is a 48- year-old right-handed white female with a history of seizures. She returns today for follow-up. The patient currently takes phenobarbital and Topamax and is tolerating it well. The patient also has dermatomyositis and is currently taking Imuran. She reports that her last seizure  was 3 years ago and it was contributed to sleep deprivation. In the past, patient had back surgery due to sciatic pain on the left side. She reports that she is now having pain on the right side. She is trying to exercise and strengthen her back. She states that if it gets worse she will visit Dr. Ronnald Ramp who did her last surgery. Patient recently had blood work in November by her PCP and her PCP will draw them again in July.   REVIEW OF SYSTEMS: Out of a complete 14 system review of symptoms, the patient complains only of the following symptoms, and all other reviewed systems are negative. Walking difficulty, back pain, snoring, depression, seizure  ALLERGIES: Allergies  Allergen Reactions  . Tylenol [Acetaminophen] Swelling and Other (See Comments)    Throat swelling, anaphylaxis  . Latex Itching    Rash     HOME MEDICATIONS: Outpatient Prescriptions Prior to Visit  Medication Sig Dispense Refill  . alendronate (FOSAMAX) 70 MG tablet     . azaTHIOprine (IMURAN) 50 MG tablet Take 50 mg by mouth daily.    . Calcium Carbonate-Vitamin D (CALCIUM + D PO) Take by mouth daily.    . Coenzyme Q10 (CO Q 10 PO) Take 100 mg by mouth daily.    . Cyanocobalamin (VITAMIN B 12 PO) Take 1,000 mg by mouth daily.    . fluticasone (FLONASE) 50 MCG/ACT nasal spray daily.    Marland Kitchen gabapentin (NEURONTIN) 800 MG tablet Take 1 tablet (800 mg total) by mouth at bedtime. 90 tablet 3  . montelukast (SINGULAIR) 10 MG tablet Take 10  mg by mouth daily.    . Multiple Vitamins-Minerals (MULTIVITAMIN PO) Take by mouth daily.    Marland Kitchen omega-3 acid ethyl esters (LOVAZA) 1 G capsule Take 1 capsule by mouth 4 (four) times daily.     Marland Kitchen PHENobarbital (LUMINAL) 97.2 MG tablet Take 1.5 tablets (145.8 mg total) by mouth daily. 135 tablet 1  . potassium chloride (K-DUR,KLOR-CON) 10 MEQ tablet Take 10 mEq by mouth daily.    Marland Kitchen telmisartan (MICARDIS) 40 MG tablet     . topiramate (TOPAMAX) 50 MG tablet Take 1 tablet in the morning and 2  tablets in the evening. 270 tablet 3  . traMADol (ULTRAM) 50 MG tablet TK ONE T PO Q 8 H PRN  2  . triamterene-hydrochlorothiazide (MAXZIDE) 75-50 MG per tablet Take by mouth daily. 1/2 tablet    . topiramate (TOPAMAX) 50 MG tablet One in the morning and two in the evening 270 tablet 3  . ZETIA 10 MG tablet Take 10 mg by mouth daily.     No facility-administered medications prior to visit.    PAST MEDICAL HISTORY: Past Medical History  Diagnosis Date  . Seizures ([place])   . Lower back pain   . HTN (hypertension)   . High cholesterol   . Osteopenia     Left side hip  . Cervical radiculitis   . Macular degeneration   . Carpal tunnel syndrome   . Hematuria     negative evaluation  . Elevated liver enzymes   . Dermatomyositis ([place])   . Bertolotti's syndrome     L4-5    PAST SURGICAL HISTORY: Past Surgical History  Procedure Laterality Date  . Tonsillectomy    . Appendectomy    . Back surgery    . Cholecystectomy  2003  . Shoulder surgery Right 2000  . Abdominal hysterectomy  1982  . Exploratory laparotomy    . Carpal tunnel release    . Laminectomy      FAMILY HISTORY: Family History  Problem Relation Age of Onset  . Cancer Mother     Cancer of the bone marrow  . Stroke Sister   . Hypertension Sister   . Diabetes Sister     SOCIAL HISTORY: Social History   Social History  . Marital Status: Married    Spouse Name: N/A  . Number of Children: 2  . Years of Education: 14   Occupational History  . Employed     Works for [employer], [employer] and [employer] History Main Topics  . Smoking status: Former Smoker    Quit date: 07/01/1978  . Smokeless tobacco: Never Used  . Alcohol Use: 0.0 oz/week    0 Standard drinks or equivalent per week     Comment: Consumes wine with dinner/socially  . Drug Use: No  . Sexual Activity:    Partners: Male    Birth Control/ Protection: Surgical     Comment: TAH   Other Topics Concern  . Not on file   Social  History Narrative   Patient lives at home with her husband Zada Finders).   Retired.   Education two years of business college.   Right handed.   Caffeine        PHYSICAL EXAM  Filed Vitals:   01/10/16 1001  BP: 138/71  Pulse: 85  Height: 4\' 10"  (1.473 m)  Weight: 156 lb 3.2 oz (70.852 kg)   Body mass index is 32.65 kg/(m^2).  Generalized: Well developed, in no acute distress  Neurological examination  Mentation: Alert oriented to time, place, history taking. Follows all commands speech and language fluent Cranial nerve II-XII: Pupils were equal round reactive to light. Extraocular movements were full, visual field were full on confrontational test. Facial sensation and strength were normal. Uvula tongue midline. Head turning and shoulder shrug  were normal and symmetric. Motor: The motor testing reveals 5 over 5 strength of all 4 extremities. Good symmetric motor tone is noted throughout.  Sensory: Sensory testing is intact to soft touch on all 4 extremities. No evidence of extinction is noted.  Coordination: Cerebellar testing reveals good finger-nose-finger and heel-to-shin bilaterally.  Gait and station: Gait is normal. Tandem gait is normal. Romberg is negative. No drift is seen.  Reflexes: Deep tendon reflexes are symmetric and normal bilaterally.   DIAGNOSTIC DATA (LABS, IMAGING, TESTING) - I reviewed patient records, labs, notes, testing and imaging myself where available.     ASSESSMENT AND PLAN 65 y.o. year old female  has a past medical history of Seizures ([place]); Lower back pain; HTN (hypertension); High cholesterol; Osteopenia; Cervical radiculitis; Macular degeneration; Carpal tunnel syndrome; Hematuria; Elevated liver enzymes; Dermatomyositis ([place]); and Bertolotti's syndrome. here with:  1. Seizures  Overall the patient is doing well. She will continue on phenobarbital and Topamax. Patient had blood work with Dr. Joylene Draft in April. I will ask that his nurse fax over  those results. Patient advised that if she has any seizure events she should let us know. Will follow-up in one year with Dr. Lucio Edward, MSN, NP-C 01/10/2016, 10:12 AM [facility] [address], [place], [place] [address] [phone]

## 2016-01-10 NOTE — Progress Notes (Signed)
I have read the note, and I agree with the clinical assessment and plan.  Durwood Dittus KEITH   

## 2016-01-10 NOTE — Patient Instructions (Signed)
Continue phenobarbital and topamax We will obtain blood work PCP If your symptoms worsen or you develop new symptoms please let us know.

## 2016-02-01 ENCOUNTER — Other Ambulatory Visit: Payer: Self-pay | Admitting: Neurology

## 2016-02-02 NOTE — Telephone Encounter (Signed)
Rx printed, signed, faxed to pharmacy. 

## 2016-02-13 DIAGNOSIS — M4726 Other spondylosis with radiculopathy, lumbar region: Secondary | ICD-10-CM | POA: Diagnosis not present

## 2016-02-13 DIAGNOSIS — Z87891 Personal history of nicotine dependence: Secondary | ICD-10-CM | POA: Diagnosis not present

## 2016-02-13 DIAGNOSIS — M545 Low back pain: Secondary | ICD-10-CM | POA: Diagnosis not present

## 2016-02-13 DIAGNOSIS — Z981 Arthrodesis status: Secondary | ICD-10-CM | POA: Diagnosis not present

## 2016-02-13 DIAGNOSIS — M5441 Lumbago with sciatica, right side: Secondary | ICD-10-CM | POA: Diagnosis not present

## 2016-02-26 DIAGNOSIS — Z981 Arthrodesis status: Secondary | ICD-10-CM | POA: Diagnosis not present

## 2016-02-26 DIAGNOSIS — M47896 Other spondylosis, lumbar region: Secondary | ICD-10-CM | POA: Diagnosis not present

## 2016-02-26 DIAGNOSIS — M5441 Lumbago with sciatica, right side: Secondary | ICD-10-CM | POA: Diagnosis not present

## 2016-02-27 DIAGNOSIS — M5441 Lumbago with sciatica, right side: Secondary | ICD-10-CM | POA: Diagnosis not present

## 2016-02-27 DIAGNOSIS — Z981 Arthrodesis status: Secondary | ICD-10-CM | POA: Diagnosis not present

## 2016-02-28 DIAGNOSIS — R945 Abnormal results of liver function studies: Secondary | ICD-10-CM | POA: Diagnosis not present

## 2016-02-28 DIAGNOSIS — N390 Urinary tract infection, site not specified: Secondary | ICD-10-CM | POA: Diagnosis not present

## 2016-02-28 DIAGNOSIS — R8299 Other abnormal findings in urine: Secondary | ICD-10-CM | POA: Diagnosis not present

## 2016-02-28 DIAGNOSIS — R7301 Impaired fasting glucose: Secondary | ICD-10-CM | POA: Diagnosis not present

## 2016-02-28 DIAGNOSIS — M859 Disorder of bone density and structure, unspecified: Secondary | ICD-10-CM | POA: Diagnosis not present

## 2016-02-28 DIAGNOSIS — R569 Unspecified convulsions: Secondary | ICD-10-CM | POA: Diagnosis not present

## 2016-02-28 DIAGNOSIS — E784 Other hyperlipidemia: Secondary | ICD-10-CM | POA: Diagnosis not present

## 2016-02-28 DIAGNOSIS — M810 Age-related osteoporosis without current pathological fracture: Secondary | ICD-10-CM | POA: Diagnosis not present

## 2016-02-28 DIAGNOSIS — I10 Essential (primary) hypertension: Secondary | ICD-10-CM | POA: Diagnosis not present

## 2016-03-05 DIAGNOSIS — Z1212 Encounter for screening for malignant neoplasm of rectum: Secondary | ICD-10-CM | POA: Diagnosis not present

## 2016-03-13 DIAGNOSIS — Z981 Arthrodesis status: Secondary | ICD-10-CM | POA: Diagnosis not present

## 2016-03-13 DIAGNOSIS — M5441 Lumbago with sciatica, right side: Secondary | ICD-10-CM | POA: Diagnosis not present

## 2016-03-20 DIAGNOSIS — Z981 Arthrodesis status: Secondary | ICD-10-CM | POA: Diagnosis not present

## 2016-03-20 DIAGNOSIS — M5441 Lumbago with sciatica, right side: Secondary | ICD-10-CM | POA: Diagnosis not present

## 2016-03-22 ENCOUNTER — Telehealth: Payer: Self-pay | Admitting: Neurology

## 2016-03-22 DIAGNOSIS — R0683 Snoring: Secondary | ICD-10-CM | POA: Diagnosis not present

## 2016-03-22 DIAGNOSIS — Z23 Encounter for immunization: Secondary | ICD-10-CM | POA: Diagnosis not present

## 2016-03-22 DIAGNOSIS — Z1389 Encounter for screening for other disorder: Secondary | ICD-10-CM | POA: Diagnosis not present

## 2016-03-22 DIAGNOSIS — M859 Disorder of bone density and structure, unspecified: Secondary | ICD-10-CM | POA: Diagnosis not present

## 2016-03-22 DIAGNOSIS — Z6833 Body mass index (BMI) 33.0-33.9, adult: Secondary | ICD-10-CM | POA: Diagnosis not present

## 2016-03-22 DIAGNOSIS — J3089 Other allergic rhinitis: Secondary | ICD-10-CM | POA: Diagnosis not present

## 2016-03-22 DIAGNOSIS — E876 Hypokalemia: Secondary | ICD-10-CM | POA: Diagnosis not present

## 2016-03-22 DIAGNOSIS — Z0000 Encounter for general adult medical examination without abnormal findings: Secondary | ICD-10-CM | POA: Diagnosis not present

## 2016-03-22 DIAGNOSIS — R569 Unspecified convulsions: Secondary | ICD-10-CM | POA: Diagnosis not present

## 2016-03-22 DIAGNOSIS — M3390 Dermatopolymyositis, unspecified, organ involvement unspecified: Secondary | ICD-10-CM | POA: Diagnosis not present

## 2016-03-22 DIAGNOSIS — I700 Atherosclerosis of aorta: Secondary | ICD-10-CM | POA: Diagnosis not present

## 2016-03-22 DIAGNOSIS — R945 Abnormal results of liver function studies: Secondary | ICD-10-CM | POA: Diagnosis not present

## 2016-03-22 NOTE — Telephone Encounter (Signed)
Bloodwork received from primary care physician reveals a phenobarbital level of 23 which is therapeutic. The chemistry panel reveals a sodium of 135, potassium 3.6, chloride 99, calcium 9.2, total protein 7.3, albumin of 3.9, AST is elevated at 69, ALT elevated at 116, these values are lower than what was seen last year. CBC reveals a white count 6, hemoglobin of 14 and hematocrit of 40.7. Platelet is 218. Hemoglobin A1c of 5.2, TSH of 1.91.

## 2016-03-27 DIAGNOSIS — M5441 Lumbago with sciatica, right side: Secondary | ICD-10-CM | POA: Diagnosis not present

## 2016-03-27 DIAGNOSIS — Z981 Arthrodesis status: Secondary | ICD-10-CM | POA: Diagnosis not present

## 2016-04-03 DIAGNOSIS — M5441 Lumbago with sciatica, right side: Secondary | ICD-10-CM | POA: Diagnosis not present

## 2016-04-03 DIAGNOSIS — Z981 Arthrodesis status: Secondary | ICD-10-CM | POA: Diagnosis not present

## 2016-04-10 ENCOUNTER — Ambulatory Visit (INDEPENDENT_AMBULATORY_CARE_PROVIDER_SITE_OTHER): Payer: Medicare Other | Admitting: Neurology

## 2016-04-10 ENCOUNTER — Encounter: Payer: Self-pay | Admitting: Neurology

## 2016-04-10 VITALS — BP 150/80 | HR 88 | Resp 20 | Ht 59.0 in | Wt 151.0 lb

## 2016-04-10 DIAGNOSIS — Z981 Arthrodesis status: Secondary | ICD-10-CM

## 2016-04-10 DIAGNOSIS — R0683 Snoring: Secondary | ICD-10-CM | POA: Diagnosis not present

## 2016-04-10 NOTE — Patient Instructions (Signed)

## 2016-04-10 NOTE — Progress Notes (Signed)
PATIENT: Amber Bennett DOB: 12/07/1950  REASON FOR VISIT: follow up HISTORY FROM: patient  I had the pleasure today on 04/10/2016 to see Amber. Tommy Bennett mother Amber Bennett, in the sleep clinic consultation. Her primary care physician is Amber. Crist Bennett. She underwent a multilevel fusion with 2 rod implantations at [facility] and after the surgery was observed to have apnea and the wake up from. It was then suggested to her that she should undergo a sleep evaluation for possible apnea. In addition she had fulfilled the risk factors of elevated body mass index, a smaller upper airway with a Mallampati grade 4, and ankle edema. She had been very sedate and not been able to exercise for almost 2 years at that point. Her husband has noted her to snore but he has never noticed her to quit breathing at night. In Summer 2017 she spent a vacation time at the beach with several girlfriends and her companions noted her not to snore.  This was after she had recovered mostly from her back surgery and she is now also in physical therapy and back in the gym having lost 6 pounds and feeling overall much better and more mobile. She has regained in activity level she didn't have for over 2 years.  She sleeps on her sides wedged by a pillow and supine sleep is not comfortable with back pain and back surgery. This may have inadvertently treated for sleep apnea, if positional dependent.  Her usual bedtime is between 11:30 and midnight during her working years she had to rise at 6 AM but by now she is retired and can sleep longer. She is often awake for about an hour before she can sleep and she prefers watching a TV show with ear plugs on her tablet, as her husband is already asleep. Usually asleep by 1 AM, she can sleep uninterrupted through the night unless her back problems aggravate her and usually in the form of right lower extremity sciatica. She has usually the distribution of the L4-L5  radiculopathy. She rises between 7 [id number] depending on the plans for the day. She gets at least 6 hours of nocturnal sleep.  Social history, married, 2 adult children ( Amber Bennett), 5 grandchildren. Amber Bennett drinks coffee 2 cups a day rarely more. She has been a social alcohol drinker one glass of wine on average with dinner. She quit smoking at age 36 which makes it over 64 years now.   Amber. Joylene Bennett had also addressed several concerns for Amber. [name] , such as transition from phenobarbital to another antiepileptic drug due to liver concerns, fatty liver changes, autoimmune disease.    HISTORY OF PRESENT ILLNESS: Amber Bennett is a 65 year old female with a history of seizures. She is currently on phenobarbital and Topamax. She reports that she is not had any seizure activity. She is able to complete all ADLs independently. She operates a Teacher, music without difficulty. Her liver enzymes are chronically elevated. She reports that her primary care feels that her liver enzymes may be contributed to her use of pain medication and NSAIDs. She states that she had back surgery in January and in the Bennett month she is noticing great improvement. She states that she is not taking nearly the amount of medicine she was for discomfort.  She is also on Imuran. She denies any new neurological symptoms. She returns today for an evaluation. /H/ISTORY 01/04/15 (Amber Bennett): Amber Bennett is a 65 year old female with a history  of seizures. She returns today for follow-up. The patient continues to take phenobarbital and Topamax and tolerating it well. She denies any seizure events. She is able to complete all ADLs independently. She operates a Teacher, music without difficulty. Denies any trouble with her cognition. No changes in her gait or balance. Patient has ongoing back pain. She is followed by Amber. Ronnald Bennett. According to the patient he has recommended a very extensive surgery. She is going to have a second opinion with Amber. Harl Bennett at [facility]. For now she continues to use the gabapentin for nerve related pain. She states this works well. She returns today for an evaluation.  HISTORY 12/01/13: former patient of Amber. Erling Bennett .Amber Bennett is a 70- year-old right-handed white female with a history of seizures. She returns today for follow-up. The patient currently takes phenobarbital and Topamax and is tolerating it well. The patient also has dermatomyositis and is currently taking Imuran. She reports that her [name] was 3 years ago and it was contributed to sleep deprivation. In the past, patient had back surgery due to sciatic pain on the left side. She reports that she is now having pain on the right side. She is trying to exercise and strengthen her back. She states that if it gets worse she will visit Amber. Ronnald Bennett who did her Bennett surgery. Patient recently had blood work in November by her PCP and her PCP will draw them again in July.   REVIEW OF SYSTEMS: Out of a complete 14 system review of symptoms, the patient complains only of the following symptoms, and all other reviewed systems are negative. Walking difficulty, back pain, snoring, depression, seizure, snoring.   ALLERGIES: Allergies  Allergen Reactions  . Tylenol [Acetaminophen] Swelling and Other (See Comments)    Throat swelling, anaphylaxis  . Latex Itching    Rash     HOME MEDICATIONS: Outpatient Medications Prior to Visit  Medication Sig Dispense Refill  . alendronate (FOSAMAX) 70 MG tablet     . azaTHIOprine (IMURAN) 50 MG tablet Take 50 mg by mouth daily.    . Calcium Carbonate-Vitamin D (CALCIUM + D PO) Take by mouth daily.    . Coenzyme Q10 (CO Q 10 PO) Take 100 mg by mouth daily.    . Cyanocobalamin (VITAMIN B 12 PO) Take 1,000 mg by mouth daily.    . DULoxetine (CYMBALTA) 30 MG capsule TK 1 C PO QPM  5  . Evolocumab (REPATHA SURECLICK) [id number] MG/ML SOAJ Inject into the skin. monthly    . fluticasone (FLONASE) 50 MCG/ACT nasal spray daily.    Marland Kitchen gabapentin  (NEURONTIN) 800 MG tablet Take 1 tablet (800 mg total) by mouth at bedtime. 90 tablet 3  . montelukast (SINGULAIR) 10 MG tablet Take 10 mg by mouth daily.    . Multiple Vitamins-Minerals (MULTIVITAMIN PO) Take by mouth daily.    Marland Kitchen omega-3 acid ethyl esters (LOVAZA) 1 G capsule Take 1 capsule by mouth 4 (four) times daily.     Marland Kitchen omeprazole (PRILOSEC) 20 MG capsule TK 1 T PO QD  3  . PHENobarbital (LUMINAL) 97.2 MG tablet TAKE ONE AND ONE HALF TABLET(145.8MG ) BY MOUTH EVERY DAY 135 tablet 1  . potassium chloride SA (K-DUR,KLOR-CON) 20 MEQ tablet TK 2 TS PO QD  8  . telmisartan (MICARDIS) 40 MG tablet     . topiramate (TOPAMAX) 50 MG tablet Take 1 tablet in the morning and 2 tablets in the evening. 270 tablet 3  . traMADol (ULTRAM)  50 MG tablet TK ONE T PO Q 8 H PRN  2  . triamterene-hydrochlorothiazide (MAXZIDE) 75-50 MG per tablet Take by mouth daily. 1/2 tablet     No facility-administered medications prior to visit.     PAST MEDICAL HISTORY: Past Medical History:  Diagnosis Date  . 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 Carpal tunnel syndrome   . Cervical radiculitis   . Dermatomyositis ([place])   . Elevated liver enzymes   . Hematuria    negative evaluation  . High cholesterol   . HTN (hypertension)   . Lower back pain   . Macular degeneration   . Osteopenia    Left side hip  . Seizures ([place])     PAST SURGICAL HISTORY: Past Surgical History:  Procedure Laterality Date  . ABDOMINAL HYSTERECTOMY  1982  . APPENDECTOMY    . BACK SURGERY    . BACK SURGERY  07/2015  . CARPAL TUNNEL RELEASE    . CHOLECYSTECTOMY  2003  . EXPLORATORY LAPAROTOMY    . LAMINECTOMY    . SHOULDER SURGERY Right 2000  . TONSILLECTOMY      FAMILY HISTORY: Family History  Problem Relation Age of Onset  . Cancer Mother     Cancer of the bone marrow  . Stroke Sister   . Hypertension Sister   . Diabetes Sister     SOCIAL HISTORY: Social History   Social History  . Marital status: Married    Spouse  name: N/A  . Number of children: 2  . Years of education: 14   Occupational History  . Employed     Works for [employer], [employer] and [place] History Main Topics  . Smoking status: Former Smoker    Quit date: 07/01/1978  . Smokeless tobacco: Never Used  . Alcohol use 0.0 oz/week     Comment: Consumes wine with dinner/socially  . Drug use: No  . Sexual activity: Yes    Partners: Male    Birth control/ protection: Surgical     Comment: TAH   Other Topics Concern  . Not on file   Social History Narrative   Patient lives at home with her husband Zada Finders).   Retired.   Education two years of business college.   Right handed.   Caffeine        PHYSICAL EXAM  Vitals:   04/10/16 1500  BP: (!) 150/80  Bennett: 88  Resp: 20  Weight: 151 lb (68.5 kg)  Height: 4\' 11"  (1.499 m)   Body mass index is 30.5 kg/m.   Patient has a Mallampati grade 2 but a very small petite airway, her neck circumference is 13-1/2 inches, she does not have retrognathia she has her biological teeth. No wheezing no delayed swallowing.  Generalized: petite  Neurological examination  Mentation: Alert oriented to time, place, history taking. Follows all commands speech and language fluent Cranial nerve ; no change  taste and smell. Pupils were equal round reactive to light. Extraocular movements were full, visual field were full on confrontational test. Facial sensation and strength were normal. Uvula tongue midline. Head turning and shoulder shrug  were normal and symmetric. Motor: The motor testing reveals 5 over 5 strength of all 4 extremities. Good symmetric motor tone is noted throughout.  Sensory: Sensory testing is intact to soft touch on all 4 extremities. No evidence of extinction is noted.  Coordination: Cerebellar testing reveals good finger-nose-finger and heel-to-shin bilaterally.  Gait and station: Gait is normal.  Tandem gait is normal. Romberg is negative. No drift is seen.    Reflexes: Deep tendon reflexes are symmetric and normal bilaterally.   DIAGNOSTIC DATA (LABS, IMAGING, TESTING) - I reviewed patient records, labs, notes, testing and imaging myself where available.  See Amber. Silvestre Mesi referral notes.   ASSESSMENT AND PLAN 65 y.o. year old female  has a past medical history of Bertolotti's syndrome; Carpal tunnel syndrome; Cervical radiculitis; Dermatomyositis ([place]); Elevated liver enzymes; Hematuria; High cholesterol; HTN (hypertension); Lower back pain; Macular degeneration; Osteopenia; and Seizures ([place]). here with:  1.High risk of OSA, witnessed snoring, pain medication related apnea. Positional component.  SPLIT study.ordered   Will follow-up after sleep study.    Lyfe Reihl, MD  04/10/2016, 3:22 PM [facility] [address], [place], [place] [address] [phone]

## 2016-04-16 ENCOUNTER — Other Ambulatory Visit: Payer: Self-pay | Admitting: *Deleted

## 2016-04-16 MED ORDER — TOPIRAMATE 50 MG PO TABS: ORAL_TABLET | ORAL | 3 refills | Status: DC

## 2016-04-17 DIAGNOSIS — Z981 Arthrodesis status: Secondary | ICD-10-CM | POA: Diagnosis not present

## 2016-04-17 DIAGNOSIS — M5441 Lumbago with sciatica, right side: Secondary | ICD-10-CM | POA: Diagnosis not present

## 2016-04-22 IMAGING — XA DG FLUORO GUIDE NDL PLC/BX
1 series · 1 of 1 positions shown · non-contrast
Comparison: none

CLINICAL DATA: Right hip pain.  Trochanteric bursitis.

[Series 1: ortho standard · 1 of 1 slices shown]
[im 1/1]
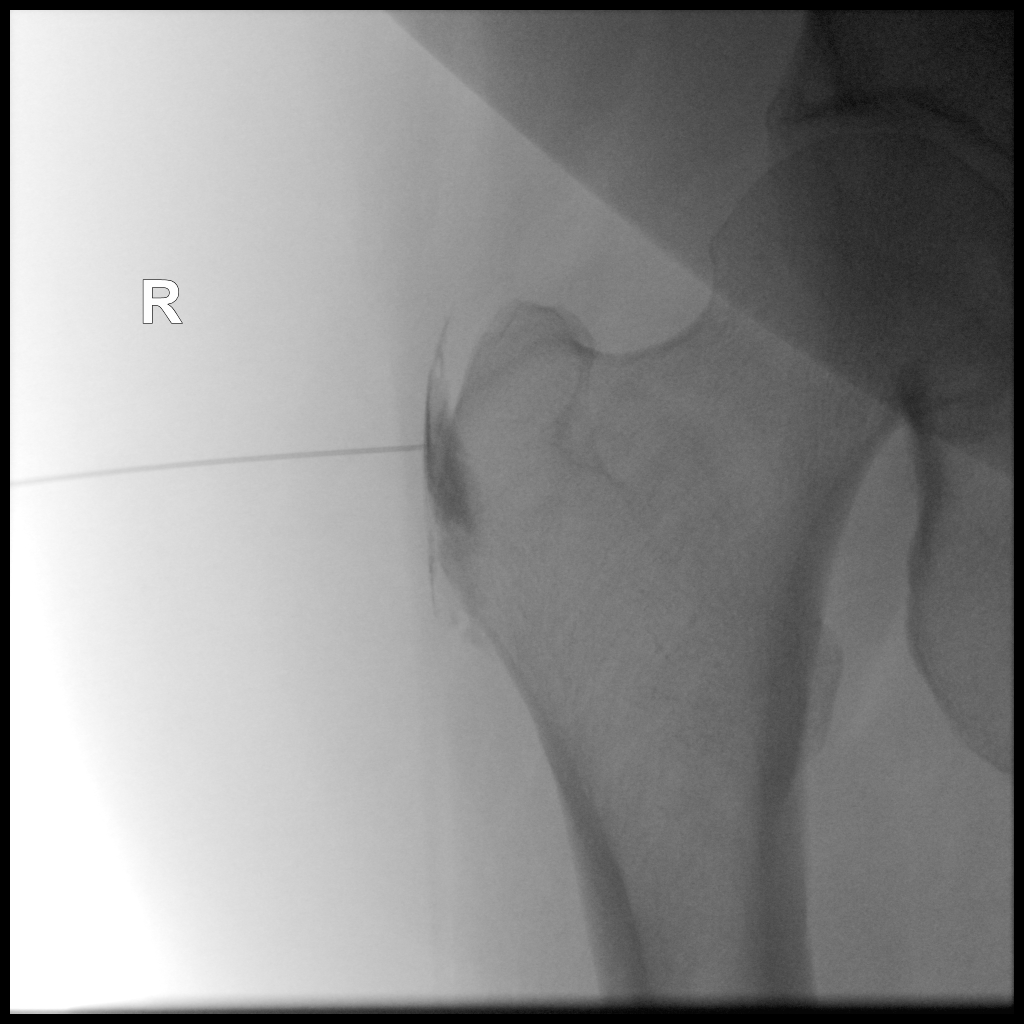

[1 of 1 positions shown; findings below may reference images not displayed]

FLUOROSCOPY TIME:  0 minutes 17 seconds. Fifty-four micro gray meter
squared

PROCEDURE:
Right greater trochanteric bursal injection.

Scan lateral to the right greater trochanter was scrubbed with
Betadine. Anesthesia was carried [DATE]% lidocaine. 22 gauge
spinal needle was directed and contact with the lateral margin of
the greater trochanter. Contrast was injected during needle
withdrawal, to lower was free communication with the bursa. 100 mg
Depo-Medrol mixed with 1 cc of 0.5% bupivacaine were instilled. The
injection was associated with some concordant right hip region pain.
The expected time courses of the anesthetic and steroid were
discussed in the patient is to follow-up with Dr. Johnsing.
IMPRESSION: Technically successful right greater trochanteric bursal injection

## 2016-04-23 ENCOUNTER — Other Ambulatory Visit: Payer: Self-pay | Admitting: Adult Health

## 2016-04-23 MED ORDER — GABAPENTIN 800 MG PO TABS: 800.0000 mg | ORAL_TABLET | Freq: Every day | ORAL | 3 refills | Status: DC

## 2016-04-23 NOTE — Telephone Encounter (Signed)
This patient is scheduled 10/25 for sleep study and she is requesting Gabapentin called in to [place].

## 2016-04-24 ENCOUNTER — Ambulatory Visit (INDEPENDENT_AMBULATORY_CARE_PROVIDER_SITE_OTHER): Payer: Medicare Other | Admitting: Neurology

## 2016-04-24 DIAGNOSIS — G4733 Obstructive sleep apnea (adult) (pediatric): Secondary | ICD-10-CM | POA: Diagnosis not present

## 2016-04-24 DIAGNOSIS — M5441 Lumbago with sciatica, right side: Secondary | ICD-10-CM | POA: Diagnosis not present

## 2016-04-24 DIAGNOSIS — R0683 Snoring: Secondary | ICD-10-CM

## 2016-04-24 DIAGNOSIS — Z981 Arthrodesis status: Secondary | ICD-10-CM | POA: Diagnosis not present

## 2016-05-01 ENCOUNTER — Telehealth: Payer: Self-pay

## 2016-05-01 NOTE — Telephone Encounter (Signed)
I spoke to pt. I advised her that her sleep study revealed no evidence of osa or hypoxemia. There is not a CPAP indication at this time. Pt's snoring was loud and increased as the night progressed- possible fatigue reaction. There was no evidence of restless sleep. PLMs were not arousal causing. Dr. Brett Fairy recommends treatment of snoring with a dental device and further weight loss. Pt verbalized understanding of results. Pt says that she will discuss these results with her husband and call us back if she decides that she wants a referral to a dentist for a dental device. Pt declined a follow up appt with Dr. Brett Fairy at this time. Pt had no questions at this time but was encouraged to call back if questions arise.

## 2016-05-08 DIAGNOSIS — Z981 Arthrodesis status: Secondary | ICD-10-CM | POA: Diagnosis not present

## 2016-05-08 DIAGNOSIS — M5441 Lumbago with sciatica, right side: Secondary | ICD-10-CM | POA: Diagnosis not present

## 2016-05-15 DIAGNOSIS — M5441 Lumbago with sciatica, right side: Secondary | ICD-10-CM | POA: Diagnosis not present

## 2016-05-15 DIAGNOSIS — Z981 Arthrodesis status: Secondary | ICD-10-CM | POA: Diagnosis not present

## 2016-05-16 ENCOUNTER — Other Ambulatory Visit: Payer: Self-pay | Admitting: Internal Medicine

## 2016-05-16 DIAGNOSIS — Z1231 Encounter for screening mammogram for malignant neoplasm of breast: Secondary | ICD-10-CM

## 2016-05-29 DIAGNOSIS — Z981 Arthrodesis status: Secondary | ICD-10-CM | POA: Diagnosis not present

## 2016-05-29 DIAGNOSIS — M5441 Lumbago with sciatica, right side: Secondary | ICD-10-CM | POA: Diagnosis not present

## 2016-05-31 DIAGNOSIS — H2000 Unspecified acute and subacute iridocyclitis: Secondary | ICD-10-CM | POA: Diagnosis not present

## 2016-06-05 DIAGNOSIS — M5441 Lumbago with sciatica, right side: Secondary | ICD-10-CM | POA: Diagnosis not present

## 2016-06-05 DIAGNOSIS — Z981 Arthrodesis status: Secondary | ICD-10-CM | POA: Diagnosis not present

## 2016-06-12 DIAGNOSIS — M5441 Lumbago with sciatica, right side: Secondary | ICD-10-CM | POA: Diagnosis not present

## 2016-06-12 DIAGNOSIS — Z981 Arthrodesis status: Secondary | ICD-10-CM | POA: Diagnosis not present

## 2016-06-18 ENCOUNTER — Encounter: Payer: Self-pay | Admitting: Obstetrics & Gynecology

## 2016-06-18 ENCOUNTER — Other Ambulatory Visit: Payer: Self-pay | Admitting: Obstetrics & Gynecology

## 2016-06-18 ENCOUNTER — Ambulatory Visit (INDEPENDENT_AMBULATORY_CARE_PROVIDER_SITE_OTHER): Payer: Medicare Other | Admitting: Obstetrics & Gynecology

## 2016-06-18 VITALS — BP 143/71 | HR 81 | Ht <= 58 in | Wt 154.0 lb

## 2016-06-18 DIAGNOSIS — Z01419 Encounter for gynecological examination (general) (routine) without abnormal findings: Secondary | ICD-10-CM | POA: Diagnosis not present

## 2016-06-18 DIAGNOSIS — Z0000 Encounter for general adult medical examination without abnormal findings: Secondary | ICD-10-CM

## 2016-06-18 NOTE — Patient Instructions (Signed)
Menopause Menopause is the normal time of life when menstrual periods stop completely. Menopause is complete when you have missed 12 consecutive menstrual periods. It usually occurs between the ages of 48 years and 55 years. Very rarely does a woman develop menopause before the age of 40 years. At menopause, your ovaries stop producing the female hormones estrogen and progesterone. This can cause undesirable symptoms and also affect your health. Sometimes the symptoms may occur 4-5 years before the menopause begins. There is no relationship between menopause and:  Oral contraceptives.  Number of children you had.  Race.  The age your menstrual periods started (menarche).  Heavy smokers and very thin women may develop menopause earlier in life. What are the causes?  The ovaries stop producing the female hormones estrogen and progesterone. Other causes include:  Surgery to remove both ovaries.  The ovaries stop functioning for no known reason.  Tumors of the pituitary gland in the brain.  Medical disease that affects the ovaries and hormone production.  Radiation treatment to the abdomen or pelvis.  Chemotherapy that affects the ovaries.  What are the signs or symptoms?  Hot flashes.  Night sweats.  Decrease in sex drive.  Vaginal dryness and thinning of the vagina causing painful intercourse.  Dryness of the skin and developing wrinkles.  Headaches.  Tiredness.  Irritability.  Memory problems.  Weight gain.  Bladder infections.  Hair growth of the face and chest.  Infertility. More serious symptoms include:  Loss of bone (osteoporosis) causing breaks (fractures).  Depression.  Hardening and narrowing of the arteries (atherosclerosis) causing heart attacks and strokes.  How is this diagnosed?  When the menstrual periods have stopped for 12 straight months.  Physical exam.  Hormone studies of the blood. How is this treated? There are many treatment  choices and nearly as many questions about them. The decisions to treat or not to treat menopausal changes is an individual choice made with your health care provider. Your health care provider can discuss the treatments with you. Together, you can decide which treatment will work best for you. Your treatment choices may include:  Hormone therapy (estrogen and progesterone).  Non-hormonal medicines.  Treating the individual symptoms with medicine (for example antidepressants for depression).  Herbal medicines that may help specific symptoms.  Counseling by a psychiatrist or psychologist.  Group therapy.  Lifestyle changes including: ? Eating healthy. ? Regular exercise. ? Limiting caffeine and alcohol. ? Stress management and meditation.  No treatment.  Follow these instructions at home:  Take the medicine your health care provider gives you as directed.  Get plenty of sleep and rest.  Exercise regularly.  Eat a diet that contains calcium (good for the bones) and soy products (acts like estrogen hormone).  Avoid alcoholic beverages.  Do not smoke.  If you have hot flashes, dress in layers.  Take supplements, calcium, and vitamin D to strengthen bones.  You can use over-the-counter lubricants or moisturizers for vaginal dryness.  Group therapy is sometimes very helpful.  Acupuncture may be helpful in some cases. Contact a health care provider if:  You are not sure you are in menopause.  You are having menopausal symptoms and need advice and treatment.  You are still having menstrual periods after age 55 years.  You have pain with intercourse.  Menopause is complete (no menstrual period for 12 months) and you develop vaginal bleeding.  You need a referral to a specialist (gynecologist, psychiatrist, or psychologist) for treatment. Get help right   away if:  You have severe depression.  You have excessive vaginal bleeding.  You fell and think you have a  broken bone.  You have pain when you urinate.  You develop leg or chest pain.  You have a fast pounding heart beat (palpitations).  You have severe headaches.  You develop vision problems.  You feel a lump in your breast.  You have abdominal pain or severe indigestion. This information is not intended to replace advice given to you by your health care provider. Make sure you discuss any questions you have with your health care provider. Document Released: 09/07/2003 Document Revised: 11/23/2015 Document Reviewed: 01/14/2013 Elsevier Interactive Patient Education  2017 Elsevier Inc.  

## 2016-06-18 NOTE — Progress Notes (Signed)
Subjective:     Amber Bennett is a 65 y.o. female here for a routine exam.  Current complaints: Pt denies GYN problems. S/p hyst for AUB 1983. Pt had a colonoscopy 8 year prev. Due in 2 years.     Gynecologic History Patient's last menstrual period was 07/01/1980. Contraception: post menopausal status Last Pap: 05/2013.Last GYN. S/p Hyst. No cervix.  Ovaries intact.  Last mammogram: 05/23/2015. Results were: normal  Scheduled for Jan 3rd  Obstetric History OB History  Gravida Para Term Preterm AB Living  2 2 2     2   SAB TAB Ectopic Multiple Live Births          2    # Outcome Date GA Lbr Len/2nd Weight Sex Delivery Anes PTL Lv  2 Term     F Vag-Spont   LIV  1 Term     M Vag-Spont   LIV     The following portions of the patient's history were reviewed and updated as appropriate: allergies, current medications, past family history, past medical history, past social history, past surgical history and problem list.  Review of Systems Pertinent items are noted in HPI.    Objective:   BP (!) 143/71   Pulse 81   Ht 4\' 10"  (1.473 m)   Wt 154 lb (69.9 kg)   LMP 07/01/1980   BMI 32.19 kg/m  General Appearance:    Alert, cooperative, no distress, appears stated age  Head:    Normocephalic, without obvious abnormality, atraumatic  Eyes:    conjunctiva/corneas clear, EOM's intact, both eyes  Ears:    Normal external ear canals, both ears  Nose:   Nares normal, septum midline, mucosa normal, no drainage    or sinus tenderness  Throat:   Lips, mucosa, and tongue normal; teeth and gums normal  Neck:   Supple, symmetrical, trachea midline, no adenopathy;    thyroid:  no enlargement/tenderness/nodules  Back:     Symmetric, no curvature, ROM normal, no CVA tenderness  Lungs:     Clear to auscultation bilaterally, respirations unlabored  Chest Wall:    No tenderness or deformity   Heart:    Regular rate and rhythm, S1 and S2 normal, no murmur, rub   or gallop  Breast Exam:    No  tenderness, masses, or nipple abnormality  Abdomen:     Soft, non-tender, bowel sounds active all four quadrants,    no masses, no organomegaly  Genitalia:    Normal female without lesion, discharge or tenderness; vaginal cuff well healed     Extremities:   Extremities normal, atraumatic, no cyanosis or edema  Pulses:   2+ and symmetric all extremities  Skin:   Skin color, texture, turgor normal, no rashes or lesions     Assessment:    Healthy female exam.   Menopausal state   Plan:    Follow up in: 1 year.    Mammogram in Jan  Bayley Yarborough L. Harraway-Smith, M.D., Cherlynn June

## 2016-07-03 ENCOUNTER — Ambulatory Visit
Admission: RE | Admit: 2016-07-03 | Discharge: 2016-07-03 | Disposition: A | Discharge status: Home / Self Care | Payer: Medicare Other | Source: Ambulatory Visit | Attending: Internal Medicine | Admitting: Internal Medicine

## 2016-07-03 DIAGNOSIS — Z1231 Encounter for screening mammogram for malignant neoplasm of breast: Secondary | ICD-10-CM | POA: Diagnosis not present

## 2016-07-03 DIAGNOSIS — M5441 Lumbago with sciatica, right side: Secondary | ICD-10-CM | POA: Diagnosis not present

## 2016-07-03 DIAGNOSIS — Z981 Arthrodesis status: Secondary | ICD-10-CM | POA: Diagnosis not present

## 2016-07-16 DIAGNOSIS — L308 Other specified dermatitis: Secondary | ICD-10-CM | POA: Diagnosis not present

## 2016-07-16 DIAGNOSIS — L821 Other seborrheic keratosis: Secondary | ICD-10-CM | POA: Diagnosis not present

## 2016-07-16 DIAGNOSIS — L738 Other specified follicular disorders: Secondary | ICD-10-CM | POA: Diagnosis not present

## 2016-07-24 DIAGNOSIS — Z8781 Personal history of (healed) traumatic fracture: Secondary | ICD-10-CM | POA: Diagnosis not present

## 2016-07-24 DIAGNOSIS — M542 Cervicalgia: Secondary | ICD-10-CM | POA: Diagnosis not present

## 2016-07-24 DIAGNOSIS — Z9181 History of falling: Secondary | ICD-10-CM | POA: Diagnosis not present

## 2016-07-24 DIAGNOSIS — Z87891 Personal history of nicotine dependence: Secondary | ICD-10-CM | POA: Diagnosis not present

## 2016-07-24 DIAGNOSIS — Z981 Arthrodesis status: Secondary | ICD-10-CM | POA: Diagnosis not present

## 2016-07-24 DIAGNOSIS — M47816 Spondylosis without myelopathy or radiculopathy, lumbar region: Secondary | ICD-10-CM | POA: Diagnosis not present

## 2016-07-24 DIAGNOSIS — M50822 Other cervical disc disorders at C5-C6 level: Secondary | ICD-10-CM | POA: Diagnosis not present

## 2016-07-24 DIAGNOSIS — M47812 Spondylosis without myelopathy or radiculopathy, cervical region: Secondary | ICD-10-CM | POA: Diagnosis not present

## 2016-07-24 DIAGNOSIS — M4856XG Collapsed vertebra, not elsewhere classified, lumbar region, subsequent encounter for fracture with delayed healing: Secondary | ICD-10-CM | POA: Diagnosis not present

## 2016-07-31 DIAGNOSIS — R569 Unspecified convulsions: Secondary | ICD-10-CM | POA: Diagnosis not present

## 2016-07-31 DIAGNOSIS — I10 Essential (primary) hypertension: Secondary | ICD-10-CM | POA: Diagnosis not present

## 2016-07-31 DIAGNOSIS — Z6832 Body mass index (BMI) 32.0-32.9, adult: Secondary | ICD-10-CM | POA: Diagnosis not present

## 2016-07-31 DIAGNOSIS — E784 Other hyperlipidemia: Secondary | ICD-10-CM | POA: Diagnosis not present

## 2016-07-31 DIAGNOSIS — R7301 Impaired fasting glucose: Secondary | ICD-10-CM | POA: Diagnosis not present

## 2016-07-31 DIAGNOSIS — M5489 Other dorsalgia: Secondary | ICD-10-CM | POA: Diagnosis not present

## 2016-07-31 DIAGNOSIS — R945 Abnormal results of liver function studies: Secondary | ICD-10-CM | POA: Diagnosis not present

## 2016-08-01 ENCOUNTER — Other Ambulatory Visit: Payer: Self-pay | Admitting: Neurology

## 2016-08-02 NOTE — Telephone Encounter (Signed)
Rx printed, signed, faxed to pharmacy. 

## 2016-08-08 DIAGNOSIS — Z961 Presence of intraocular lens: Secondary | ICD-10-CM | POA: Diagnosis not present

## 2016-08-08 DIAGNOSIS — H5310 Unspecified subjective visual disturbances: Secondary | ICD-10-CM | POA: Diagnosis not present

## 2016-09-24 DIAGNOSIS — R1011 Right upper quadrant pain: Secondary | ICD-10-CM | POA: Diagnosis not present

## 2016-09-25 ENCOUNTER — Other Ambulatory Visit: Payer: Self-pay | Admitting: Internal Medicine

## 2016-09-25 DIAGNOSIS — R1011 Right upper quadrant pain: Secondary | ICD-10-CM

## 2016-10-04 ENCOUNTER — Ambulatory Visit
Admission: RE | Admit: 2016-10-04 | Discharge: 2016-10-04 | Disposition: A | Discharge status: Home / Self Care | Payer: Medicare Other | Source: Ambulatory Visit | Attending: Internal Medicine | Admitting: Internal Medicine

## 2016-10-04 DIAGNOSIS — R1011 Right upper quadrant pain: Secondary | ICD-10-CM

## 2016-10-04 DIAGNOSIS — R109 Unspecified abdominal pain: Secondary | ICD-10-CM | POA: Diagnosis not present

## 2016-10-31 DIAGNOSIS — H04123 Dry eye syndrome of bilateral lacrimal glands: Secondary | ICD-10-CM | POA: Diagnosis not present

## 2016-10-31 DIAGNOSIS — H524 Presbyopia: Secondary | ICD-10-CM | POA: Diagnosis not present

## 2016-10-31 DIAGNOSIS — Z961 Presence of intraocular lens: Secondary | ICD-10-CM | POA: Diagnosis not present

## 2016-12-30 DIAGNOSIS — M3390 Dermatopolymyositis, unspecified, organ involvement unspecified: Secondary | ICD-10-CM | POA: Diagnosis not present

## 2016-12-30 DIAGNOSIS — R7301 Impaired fasting glucose: Secondary | ICD-10-CM | POA: Diagnosis not present

## 2016-12-30 DIAGNOSIS — R569 Unspecified convulsions: Secondary | ICD-10-CM | POA: Diagnosis not present

## 2016-12-30 DIAGNOSIS — I10 Essential (primary) hypertension: Secondary | ICD-10-CM | POA: Diagnosis not present

## 2016-12-30 DIAGNOSIS — Z6833 Body mass index (BMI) 33.0-33.9, adult: Secondary | ICD-10-CM | POA: Diagnosis not present

## 2016-12-30 DIAGNOSIS — R350 Frequency of micturition: Secondary | ICD-10-CM | POA: Diagnosis not present

## 2017-01-02 DIAGNOSIS — R945 Abnormal results of liver function studies: Secondary | ICD-10-CM | POA: Diagnosis not present

## 2017-01-02 DIAGNOSIS — Z1159 Encounter for screening for other viral diseases: Secondary | ICD-10-CM | POA: Diagnosis not present

## 2017-01-02 DIAGNOSIS — R799 Abnormal finding of blood chemistry, unspecified: Secondary | ICD-10-CM | POA: Diagnosis not present

## 2017-01-02 DIAGNOSIS — R531 Weakness: Secondary | ICD-10-CM | POA: Diagnosis not present

## 2017-01-02 DIAGNOSIS — K760 Fatty (change of) liver, not elsewhere classified: Secondary | ICD-10-CM | POA: Diagnosis not present

## 2017-01-15 ENCOUNTER — Ambulatory Visit (INDEPENDENT_AMBULATORY_CARE_PROVIDER_SITE_OTHER): Payer: Medicare Other | Admitting: Neurology

## 2017-01-15 ENCOUNTER — Encounter: Payer: Self-pay | Admitting: Neurology

## 2017-01-15 VITALS — BP 144/73 | HR 85 | Ht <= 58 in | Wt 159.5 lb

## 2017-01-15 DIAGNOSIS — Z5181 Encounter for therapeutic drug level monitoring: Secondary | ICD-10-CM

## 2017-01-15 DIAGNOSIS — R569 Unspecified convulsions: Secondary | ICD-10-CM

## 2017-01-15 MED ORDER — PHENOBARBITAL 97.2 MG PO TABS: 145.8000 mg | ORAL_TABLET | Freq: Every day | ORAL | 3 refills | Status: DC

## 2017-01-15 NOTE — Progress Notes (Signed)
Faxed printed/signed rx phenobarbital to pt pharmacy. Fax: [phone]. Received confirmation.

## 2017-01-15 NOTE — Progress Notes (Signed)
Reason for visit: Seizures  Amber Bennett is an 66 y.o. female  History of present illness:  Amber Bennett is a 66 year old right-handed white female with a history of seizures with partial complex features, the last seizure she had was in February 2012. The patient has been well controlled on Topamax and phenobarbital combination. She is tolerating these medications well. She is concerned about weight gain. She carries the diagnosis of dermatomyositis, she currently is on no medications for this. She is being followed for chronic elevations in liver enzymes, she has been told that she has fatty liver. Her most recent AST was 88, ALT was 110. The patient is followed through [facility] for this. She does have chronic low back pain, she has to exercise in the swimming pool as walking may worsen her back pain. The patient returns to this office for an evaluation.  Past Medical History:  Diagnosis Date  . 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 Carpal tunnel syndrome   . Cervical radiculitis   . Dermatomyositis ([place])   . Elevated liver enzymes   . Hematuria    negative evaluation  . High cholesterol   . HTN (hypertension)   . Lower back pain   . Macular degeneration   . Osteopenia    Left side hip  . Seizures ([place])     Past Surgical History:  Procedure Laterality Date  . ABDOMINAL HYSTERECTOMY  1982  . APPENDECTOMY    . BACK SURGERY    . BACK SURGERY  07/2015  . CARPAL TUNNEL RELEASE    . CHOLECYSTECTOMY  2003  . EXPLORATORY LAPAROTOMY    . LAMINECTOMY    . SHOULDER SURGERY Right 2000  . TONSILLECTOMY      Family History  Problem Relation Age of Onset  . Cancer Mother        Cancer of the bone marrow  . Stroke Sister   . Hypertension Sister   . Diabetes Sister     Social history:  reports that she quit smoking about 38 years ago. She has never used smokeless tobacco. She reports that she drinks alcohol. She reports that she does not use drugs.    Allergies  Allergen  Reactions  . Tylenol [Acetaminophen] Swelling and Other (See Comments)    Throat swelling, anaphylaxis  . Latex Itching    Rash     Medications:  Prior to Admission medications   Medication Sig Start Date End Date Taking? Authorizing Provider  alendronate (FOSAMAX) 70 MG tablet Take 70 mg by mouth once a week.  10/22/13  Yes [provider]  Calcium Carbonate-Vitamin D (CALCIUM + D PO) Take by mouth daily.   Yes [provider]  Coenzyme Q10 (CO Q 10 PO) Take 100 mg by mouth daily.   Yes [provider]  Cyanocobalamin (VITAMIN B 12 PO) Take 1,000 mg by mouth daily.   Yes [provider]  DULoxetine (CYMBALTA) 30 MG capsule TK 1 C PO QPM 12/28/15  Yes [provider]  Evolocumab (REPATHA SURECLICK) 893 MG/ML SOAJ Inject into the skin. monthly   Yes [provider]  ezetimibe (ZETIA) 10 MG tablet Take 10 mg by mouth daily.   Yes [provider]  fluticasone (FLONASE) 50 MCG/ACT nasal spray Place 2 sprays into both nostrils daily.  03/18/13  Yes [provider]  gabapentin (NEURONTIN) 400 MG capsule Take 400 mg by mouth every morning.   Yes [provider]  gabapentin (NEURONTIN)  800 MG tablet Take 1 tablet (800 mg total) by mouth at bedtime. 04/23/16  Yes Ward Givens, NP  loratadine (CLARITIN) 10 MG tablet Take 10 mg by mouth daily.   Yes [provider]  montelukast (SINGULAIR) 10 MG tablet Take 10 mg by mouth daily. 11/21/12  Yes [provider]  Multiple Vitamins-Minerals (MULTIVITAMIN PO) Take by mouth daily.   Yes [provider]  omega-3 acid ethyl esters (LOVAZA) 1 G capsule Take 1 capsule by mouth 4 (four) times daily.  11/08/12  Yes [provider]  omeprazole (PRILOSEC) 20 MG capsule TK 1 T PO QD 11/20/15  Yes [provider]  PHENobarbital (LUMINAL) 97.2 MG tablet TAKE ONE AND ONE-HALF TABLETS BY MOUTH ONCE DAILY 08/02/16  Yes Kathrynn Ducking, MD  potassium  chloride SA (K-DUR,KLOR-CON) 20 MEQ tablet TK 2 TS PO QD 10/24/15  Yes [provider]  Probiotic Product (PROBIOTIC PO) Take 1 Dose by mouth daily.   Yes [provider]  telmisartan (MICARDIS) 40 MG tablet Take 40 mg by mouth daily.  11/30/13  Yes [provider]  topiramate (TOPAMAX) 50 MG tablet Take 1 tablet in the morning and 2 tablets in the evening. 04/16/16  Yes Ward Givens, NP  triamterene-hydrochlorothiazide (MAXZIDE) 75-50 MG per tablet Take by mouth daily. 1/2 tablet 11/09/12  Yes [provider]    ROS:  Out of a complete 14 system review of symptoms, the patient complains only of the following symptoms, and all other reviewed systems are negative.  Excessive sweating Light sensitivity Blood in the urine Low back pain  Blood pressure (!) 144/73, pulse 85, height 4\' 10"  (1.473 m), weight 159 lb 8 oz (72.3 kg), last menstrual period 07/01/1980.  Physical Exam  General: The patient is alert and cooperative at the time of the examination. The patient is moderately to markedly obese.  Skin: No significant peripheral edema is noted.   Neurologic Exam  Mental status: The patient is alert and oriented x 3 at the time of the examination. The patient has apparent normal recent and remote memory, with an apparently normal attention span and concentration ability.   Cranial nerves: Facial symmetry is present. Speech is normal, no aphasia or dysarthria is noted. Extraocular movements are full. Visual fields are full.  Motor: The patient has good strength in all 4 extremities, with exception of mild hip flexion weakness bilaterally.  Sensory examination: Soft touch sensation is symmetric on the face, arms, and legs.  Coordination: The patient has good finger-nose-finger and heel-to-shin bilaterally.  Gait and station: The patient has a normal gait. Tandem gait is normal. Romberg is negative. No drift is seen.  Reflexes: Deep tendon reflexes  are symmetric.   Assessment/Plan:  1. History of seizures, well controlled  2. Chronic elevation in liver enzymes  3. History of dermatomyositis  The patient will be sent for blood work today. This will include a CK enzyme level. The patient will follow-up in one year, sooner if needed. A prescription was written for phenobarbital.  Jill Alexanders MD 01/15/2017 11:18 AM  [facility] [address] [place], [place] [address]  Phone [phone] Fax [phone]

## 2017-01-16 ENCOUNTER — Telehealth: Payer: Self-pay | Admitting: *Deleted

## 2017-01-16 LAB — PHENOBARBITAL LEVEL: Phenobarbital, Serum: 18 ug/mL (ref 15–40)

## 2017-01-16 LAB — CK: Total CK: 114 U/L (ref 24–173)

## 2017-01-16 NOTE — Telephone Encounter (Signed)
-----   Message from Kathrynn Ducking, MD sent at 01/16/2017  9:11 AM EDT -----  The blood work results are unremarkable. Good phenobarbital level, no change in dosing. Please call the patient. ----- Message ----- From: Lavone Neri Lab Results In Sent: 01/16/2017   8:53 AM To: Kathrynn Ducking, MD

## 2017-01-16 NOTE — Telephone Encounter (Signed)
Called and spoke with patient about unremarkable labs per CW,MD note. Advised phenobarbital level was good. No change in dosing needed. Pt verbalized understanding.

## 2017-03-04 ENCOUNTER — Other Ambulatory Visit: Payer: Self-pay | Admitting: Adult Health

## 2017-03-31 DIAGNOSIS — Z23 Encounter for immunization: Secondary | ICD-10-CM | POA: Diagnosis not present

## 2017-04-21 DIAGNOSIS — M1811 Unilateral primary osteoarthritis of first carpometacarpal joint, right hand: Secondary | ICD-10-CM | POA: Diagnosis not present

## 2017-04-21 DIAGNOSIS — M720 Palmar fascial fibromatosis [Dupuytren]: Secondary | ICD-10-CM | POA: Diagnosis not present

## 2017-04-25 ENCOUNTER — Other Ambulatory Visit: Payer: Self-pay | Admitting: Adult Health

## 2017-05-08 DIAGNOSIS — Z4789 Encounter for other orthopedic aftercare: Secondary | ICD-10-CM | POA: Diagnosis not present

## 2017-05-08 DIAGNOSIS — M65321 Trigger finger, right index finger: Secondary | ICD-10-CM | POA: Diagnosis not present

## 2017-05-08 DIAGNOSIS — M720 Palmar fascial fibromatosis [Dupuytren]: Secondary | ICD-10-CM | POA: Diagnosis not present

## 2017-05-14 DIAGNOSIS — E7849 Other hyperlipidemia: Secondary | ICD-10-CM | POA: Diagnosis not present

## 2017-05-14 DIAGNOSIS — M859 Disorder of bone density and structure, unspecified: Secondary | ICD-10-CM | POA: Diagnosis not present

## 2017-05-14 DIAGNOSIS — I10 Essential (primary) hypertension: Secondary | ICD-10-CM | POA: Diagnosis not present

## 2017-05-14 DIAGNOSIS — R7301 Impaired fasting glucose: Secondary | ICD-10-CM | POA: Diagnosis not present

## 2017-05-14 DIAGNOSIS — R82998 Other abnormal findings in urine: Secondary | ICD-10-CM | POA: Diagnosis not present

## 2017-05-14 DIAGNOSIS — R569 Unspecified convulsions: Secondary | ICD-10-CM | POA: Diagnosis not present

## 2017-05-19 DIAGNOSIS — Z6834 Body mass index (BMI) 34.0-34.9, adult: Secondary | ICD-10-CM | POA: Diagnosis not present

## 2017-05-19 DIAGNOSIS — I440 Atrioventricular block, first degree: Secondary | ICD-10-CM | POA: Diagnosis not present

## 2017-05-19 DIAGNOSIS — I10 Essential (primary) hypertension: Secondary | ICD-10-CM | POA: Diagnosis not present

## 2017-05-19 DIAGNOSIS — G5602 Carpal tunnel syndrome, left upper limb: Secondary | ICD-10-CM | POA: Diagnosis not present

## 2017-05-19 DIAGNOSIS — R7301 Impaired fasting glucose: Secondary | ICD-10-CM | POA: Diagnosis not present

## 2017-05-19 DIAGNOSIS — I700 Atherosclerosis of aorta: Secondary | ICD-10-CM | POA: Diagnosis not present

## 2017-05-19 DIAGNOSIS — R569 Unspecified convulsions: Secondary | ICD-10-CM | POA: Diagnosis not present

## 2017-05-19 DIAGNOSIS — M5489 Other dorsalgia: Secondary | ICD-10-CM | POA: Diagnosis not present

## 2017-05-19 DIAGNOSIS — Z1389 Encounter for screening for other disorder: Secondary | ICD-10-CM | POA: Diagnosis not present

## 2017-05-19 DIAGNOSIS — R945 Abnormal results of liver function studies: Secondary | ICD-10-CM | POA: Diagnosis not present

## 2017-05-19 DIAGNOSIS — E7849 Other hyperlipidemia: Secondary | ICD-10-CM | POA: Diagnosis not present

## 2017-05-19 DIAGNOSIS — Z0000 Encounter for general adult medical examination without abnormal findings: Secondary | ICD-10-CM | POA: Diagnosis not present

## 2017-05-26 DIAGNOSIS — Z1212 Encounter for screening for malignant neoplasm of rectum: Secondary | ICD-10-CM | POA: Diagnosis not present

## 2017-05-30 DIAGNOSIS — M720 Palmar fascial fibromatosis [Dupuytren]: Secondary | ICD-10-CM | POA: Diagnosis not present

## 2017-07-04 ENCOUNTER — Other Ambulatory Visit: Payer: Self-pay | Admitting: Internal Medicine

## 2017-07-04 DIAGNOSIS — E785 Hyperlipidemia, unspecified: Secondary | ICD-10-CM

## 2017-07-04 DIAGNOSIS — I700 Atherosclerosis of aorta: Secondary | ICD-10-CM

## 2017-07-16 ENCOUNTER — Ambulatory Visit
Admission: RE | Admit: 2017-07-16 | Discharge: 2017-07-16 | Disposition: A | Discharge status: Home / Self Care | Payer: Medicare Other | Source: Ambulatory Visit | Attending: Internal Medicine | Admitting: Internal Medicine

## 2017-07-16 DIAGNOSIS — E785 Hyperlipidemia, unspecified: Secondary | ICD-10-CM

## 2017-07-16 DIAGNOSIS — I700 Atherosclerosis of aorta: Secondary | ICD-10-CM | POA: Diagnosis not present

## 2017-07-16 DIAGNOSIS — Z8349 Family history of other endocrine, nutritional and metabolic diseases: Secondary | ICD-10-CM | POA: Diagnosis not present

## 2017-07-23 DIAGNOSIS — Z124 Encounter for screening for malignant neoplasm of cervix: Secondary | ICD-10-CM | POA: Diagnosis not present

## 2017-07-23 DIAGNOSIS — Z1231 Encounter for screening mammogram for malignant neoplasm of breast: Secondary | ICD-10-CM | POA: Diagnosis not present

## 2017-07-23 DIAGNOSIS — Z01419 Encounter for gynecological examination (general) (routine) without abnormal findings: Secondary | ICD-10-CM | POA: Diagnosis not present

## 2017-07-26 ENCOUNTER — Other Ambulatory Visit: Payer: Self-pay | Admitting: Neurology

## 2017-07-28 NOTE — Telephone Encounter (Signed)
Faxed printed/signed rx phenobarbital to [facility] at [phone]. Received fax confirmation.

## 2017-08-07 DIAGNOSIS — K760 Fatty (change of) liver, not elsewhere classified: Secondary | ICD-10-CM | POA: Diagnosis not present

## 2017-08-13 ENCOUNTER — Other Ambulatory Visit: Payer: Self-pay | Admitting: Adult Health

## 2017-08-13 DIAGNOSIS — I10 Essential (primary) hypertension: Secondary | ICD-10-CM | POA: Diagnosis not present

## 2017-08-13 DIAGNOSIS — R7301 Impaired fasting glucose: Secondary | ICD-10-CM | POA: Diagnosis not present

## 2017-08-13 DIAGNOSIS — Z6833 Body mass index (BMI) 33.0-33.9, adult: Secondary | ICD-10-CM | POA: Diagnosis not present

## 2017-08-13 DIAGNOSIS — K760 Fatty (change of) liver, not elsewhere classified: Secondary | ICD-10-CM | POA: Diagnosis not present

## 2017-08-14 NOTE — Telephone Encounter (Signed)
Fax confirmation received for [phone] [facility].

## 2017-09-19 DIAGNOSIS — R569 Unspecified convulsions: Secondary | ICD-10-CM | POA: Diagnosis not present

## 2017-09-19 DIAGNOSIS — I10 Essential (primary) hypertension: Secondary | ICD-10-CM | POA: Diagnosis not present

## 2017-09-19 DIAGNOSIS — R7301 Impaired fasting glucose: Secondary | ICD-10-CM | POA: Diagnosis not present

## 2017-09-19 DIAGNOSIS — Z6832 Body mass index (BMI) 32.0-32.9, adult: Secondary | ICD-10-CM | POA: Diagnosis not present

## 2017-09-19 DIAGNOSIS — K760 Fatty (change of) liver, not elsewhere classified: Secondary | ICD-10-CM | POA: Diagnosis not present

## 2017-10-07 DIAGNOSIS — L821 Other seborrheic keratosis: Secondary | ICD-10-CM | POA: Diagnosis not present

## 2017-10-07 DIAGNOSIS — L720 Epidermal cyst: Secondary | ICD-10-CM | POA: Diagnosis not present

## 2017-10-07 DIAGNOSIS — L820 Inflamed seborrheic keratosis: Secondary | ICD-10-CM | POA: Diagnosis not present

## 2017-10-20 ENCOUNTER — Telehealth: Payer: Self-pay | Admitting: Neurology

## 2017-10-20 MED ORDER — GABAPENTIN 800 MG PO TABS: 800.0000 mg | ORAL_TABLET | Freq: Two times a day (BID) | ORAL | 1 refills | Status: DC

## 2017-10-20 NOTE — Telephone Encounter (Signed)
I called the patient.  The patient is having sciatica type pain, the gabapentin helps this, but she had increase the gabapentin to 800 mg twice daily, I will call in a prescription to cover this.

## 2017-10-20 NOTE — Telephone Encounter (Signed)
Patient calling to discuss increasing dosage of gabapentin (NEURONTIN) 800 MG tablet. Patient uses Paediatric nurse on [place].

## 2017-10-20 NOTE — Addendum Note (Signed)
Addended by: Kathrynn Ducking on: 10/20/2017 01:28 PM   Modules accepted: Orders

## 2017-10-20 NOTE — Telephone Encounter (Signed)
Faxed printed/signed rx gabapentin 800mg  tab to [facility] at [phone]. Received fax confirmation.

## 2017-11-06 DIAGNOSIS — H02831 Dermatochalasis of right upper eyelid: Secondary | ICD-10-CM | POA: Diagnosis not present

## 2017-11-06 DIAGNOSIS — H524 Presbyopia: Secondary | ICD-10-CM | POA: Diagnosis not present

## 2017-11-06 DIAGNOSIS — H5310 Unspecified subjective visual disturbances: Secondary | ICD-10-CM | POA: Diagnosis not present

## 2017-11-06 DIAGNOSIS — H02834 Dermatochalasis of left upper eyelid: Secondary | ICD-10-CM | POA: Diagnosis not present

## 2017-11-13 DIAGNOSIS — K760 Fatty (change of) liver, not elsewhere classified: Secondary | ICD-10-CM | POA: Diagnosis not present

## 2017-11-13 DIAGNOSIS — E7849 Other hyperlipidemia: Secondary | ICD-10-CM | POA: Diagnosis not present

## 2017-11-18 DIAGNOSIS — H02413 Mechanical ptosis of bilateral eyelids: Secondary | ICD-10-CM | POA: Diagnosis not present

## 2017-11-25 DIAGNOSIS — K760 Fatty (change of) liver, not elsewhere classified: Secondary | ICD-10-CM | POA: Diagnosis not present

## 2017-11-25 DIAGNOSIS — M2550 Pain in unspecified joint: Secondary | ICD-10-CM | POA: Diagnosis not present

## 2017-11-25 DIAGNOSIS — I10 Essential (primary) hypertension: Secondary | ICD-10-CM | POA: Diagnosis not present

## 2017-11-25 DIAGNOSIS — M3390 Dermatopolymyositis, unspecified, organ involvement unspecified: Secondary | ICD-10-CM | POA: Diagnosis not present

## 2017-11-25 DIAGNOSIS — G629 Polyneuropathy, unspecified: Secondary | ICD-10-CM | POA: Diagnosis not present

## 2017-11-25 DIAGNOSIS — R531 Weakness: Secondary | ICD-10-CM | POA: Diagnosis not present

## 2017-11-25 DIAGNOSIS — Z6830 Body mass index (BMI) 30.0-30.9, adult: Secondary | ICD-10-CM | POA: Diagnosis not present

## 2017-12-08 DIAGNOSIS — E876 Hypokalemia: Secondary | ICD-10-CM | POA: Diagnosis not present

## 2017-12-30 DIAGNOSIS — M3393 Dermatopolymyositis, unspecified without myopathy: Secondary | ICD-10-CM | POA: Diagnosis not present

## 2018-01-23 DIAGNOSIS — R945 Abnormal results of liver function studies: Secondary | ICD-10-CM | POA: Diagnosis not present

## 2018-01-23 DIAGNOSIS — R569 Unspecified convulsions: Secondary | ICD-10-CM | POA: Diagnosis not present

## 2018-01-23 DIAGNOSIS — Z6830 Body mass index (BMI) 30.0-30.9, adult: Secondary | ICD-10-CM | POA: Diagnosis not present

## 2018-01-23 DIAGNOSIS — M3390 Dermatopolymyositis, unspecified, organ involvement unspecified: Secondary | ICD-10-CM | POA: Diagnosis not present

## 2018-01-23 DIAGNOSIS — I10 Essential (primary) hypertension: Secondary | ICD-10-CM | POA: Diagnosis not present

## 2018-01-23 DIAGNOSIS — R7301 Impaired fasting glucose: Secondary | ICD-10-CM | POA: Diagnosis not present

## 2018-01-27 ENCOUNTER — Ambulatory Visit (INDEPENDENT_AMBULATORY_CARE_PROVIDER_SITE_OTHER): Payer: Medicare Other | Admitting: Neurology

## 2018-01-27 ENCOUNTER — Other Ambulatory Visit: Payer: Self-pay

## 2018-01-27 ENCOUNTER — Encounter: Payer: Self-pay | Admitting: Neurology

## 2018-01-27 VITALS — BP 121/60 | HR 80 | Wt 146.0 lb

## 2018-01-27 DIAGNOSIS — R569 Unspecified convulsions: Secondary | ICD-10-CM | POA: Diagnosis not present

## 2018-01-27 MED ORDER — PHENOBARBITAL 97.2 MG PO TABS: ORAL_TABLET | ORAL | 1 refills | Status: DC

## 2018-01-27 NOTE — Progress Notes (Signed)
Reason for visit: Seizures  Amber Bennett is an 67 y.o. female  History of present illness:  Amber Bennett is a 67 year old right-handed white female with a history of seizures on phenobarbital, the patient has done quite well with this.  She has had recent blood work done by her primary care physician that included a phenobarbital level.  The results are going to be sent to our office.  The patient is tolerating this medication well.  She has entered into a ketogenic diet which has helped her lose some weight and has improved her liver function tests significantly.  The patient continues to have some low back pain, she has had prior lumbosacral spine surgery.  She may have some discomfort radiating from the back to the knee area on the right, this improves with sitting or lying down.  The patient is able to exercise in a swimming pool without pain.  The patient reports no weakness or numbness of the extremities.  She remains on gabapentin, Topamax, and phenobarbital.  Past Medical History:  Diagnosis Date  . 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 Carpal tunnel syndrome   . Cervical radiculitis   . Dermatomyositis ([place])   . Elevated liver enzymes   . Hematuria    negative evaluation  . High cholesterol   . HTN (hypertension)   . Lower back pain   . Macular degeneration   . Osteopenia    Left side hip  . Seizures ([place])     Past Surgical History:  Procedure Laterality Date  . ABDOMINAL HYSTERECTOMY  1982  . APPENDECTOMY    . BACK SURGERY    . BACK SURGERY  07/2015  . CARPAL TUNNEL RELEASE    . CHOLECYSTECTOMY  2003  . EXPLORATORY LAPAROTOMY    . LAMINECTOMY    . SHOULDER SURGERY Right 2000  . TONSILLECTOMY      Family History  Problem Relation Age of Onset  . Cancer Mother        Cancer of the bone marrow  . Stroke Sister   . Hypertension Sister   . Diabetes Sister     Social history:  reports that she quit smoking about 39 years ago. She has never used smokeless tobacco.  She reports that she drinks alcohol. She reports that she does not use drugs.    Allergies  Allergen Reactions  . Tylenol [Acetaminophen] Swelling and Other (See Comments)    Throat swelling, anaphylaxis  . Latex Itching    Rash     Medications:  Prior to Admission medications   Medication Sig Start Date End Date Taking? Authorizing Provider  alendronate (FOSAMAX) 70 MG tablet Take 70 mg by mouth once a week.  10/22/13  Yes [provider]  Calcium Carbonate-Vitamin D (CALCIUM + D PO) Take by mouth daily.   Yes [provider]  Coenzyme Q10 (CO Q 10 PO) Take 100 mg by mouth daily.   Yes [provider]  Cyanocobalamin (VITAMIN B 12 PO) Take 1,000 mg by mouth daily.   Yes [provider]  DULoxetine (CYMBALTA) 30 MG capsule TK 1 C PO QPM 12/28/15  Yes [provider]  Evolocumab (REPATHA SURECLICK) 993 MG/ML SOAJ Inject into the skin. monthly   Yes [provider]  ezetimibe (ZETIA) 10 MG tablet Take 10 mg by mouth daily.   Yes [provider]  fluticasone (FLONASE) 50 MCG/ACT nasal spray Place 2 sprays into both nostrils daily.  03/18/13  Yes  [provider]  gabapentin (NEURONTIN) 800 MG tablet Take 1 tablet (800 mg total) by mouth 2 (two) times daily. 10/20/17  Yes Kathrynn Ducking, MD  loratadine (CLARITIN) 10 MG tablet Take 10 mg by mouth daily.   Yes [provider]  montelukast (SINGULAIR) 10 MG tablet Take 10 mg by mouth daily. 11/21/12  Yes [provider]  Multiple Vitamins-Minerals (MULTIVITAMIN PO) Take by mouth daily.   Yes [provider]  omega-3 acid ethyl esters (LOVAZA) 1 G capsule Take 1 capsule by mouth 4 (four) times daily.  11/08/12  Yes [provider]  omeprazole (PRILOSEC) 20 MG capsule TK 1 T PO QD 11/20/15  Yes [provider]  OZEMPIC 1 MG/DOSE SOPN INJECT 1 MG INTO THE SKIN ONCE A WEEK 01/05/18  Yes [provider]  PHENobarbital (LUMINAL)  97.2 MG tablet TAKE 1 & 1/2 (ONE & ONE-HALF) TABLETS BY MOUTH ONCE DAILY 01/27/18  Yes Kathrynn Ducking, MD  Probiotic Product (PROBIOTIC PO) Take 1 Dose by mouth daily.   Yes [provider]  telmisartan (MICARDIS) 40 MG tablet Take 40 mg by mouth daily.  11/30/13  Yes [provider]  topiramate (TOPAMAX) 50 MG tablet TAKE ONE TABLET BY MOUTH ONCE DAILY IN THE MORNING AND TWO TABLETS IN THE EVENING 04/28/17  Yes Kathrynn Ducking, MD    ROS:  Out of a complete 14 system review of symptoms, the patient complains only of the following symptoms, and all other reviewed systems are negative.  Light sensitivity Joint pain, back pain, neck pain  Blood pressure 121/60, pulse 80, weight 146 lb (66.2 kg), last menstrual period 07/01/1980, SpO2 98 %.  Physical Exam  General: The patient is alert and cooperative at the time of the examination.  The patient is moderately obese.  Skin: No significant peripheral edema is noted.   Neurologic Exam  Mental status: The patient is alert and oriented x 3 at the time of the examination. The patient has apparent normal recent and remote memory, with an apparently normal attention span and concentration ability.   Cranial nerves: Facial symmetry is present. Speech is normal, no aphasia or dysarthria is noted. Extraocular movements are full. Visual fields are full.  Motor: The patient has good strength in all 4 extremities.  Sensory examination: Soft touch sensation is symmetric on the face, arms, and legs.  Coordination: The patient has good finger-nose-finger and heel-to-shin bilaterally.  Gait and station: The patient has a normal gait. Tandem gait is normal. Romberg is negative. No drift is seen.  Reflexes: Deep tendon reflexes are symmetric.  Toes are downgoing bilaterally.   Assessment/Plan:  1.  History of seizures, well controlled  2.  Chronic low back pain, right leg pain  The patient is doing fairly well at this time.   She is losing some weight on her diet, she is able to exercise regularly.  The back pain and leg pain may be related to facet joint arthritis.  She will continue the phenobarbital, Topamax, and gabapentin.  A prescription for the phenobarbital was sent in.  She will follow-up in 1 year.  Jill Alexanders MD 01/27/2018 12:50 PM  [facility] [address] [place], [place] [address]  Phone [phone] Fax [phone]

## 2018-02-02 ENCOUNTER — Other Ambulatory Visit: Payer: Self-pay | Admitting: Neurology

## 2018-02-11 ENCOUNTER — Telehealth: Payer: Self-pay | Admitting: Neurology

## 2018-02-11 DIAGNOSIS — Z01818 Encounter for other preprocedural examination: Secondary | ICD-10-CM | POA: Diagnosis not present

## 2018-02-11 DIAGNOSIS — H02413 Mechanical ptosis of bilateral eyelids: Secondary | ICD-10-CM | POA: Diagnosis not present

## 2018-02-11 NOTE — Telephone Encounter (Signed)
I have received blood work results from Dr. Haynes Kerns done on 23 January 2018.  Phenobarbital level was 23, glucose 87, BUN 15, creatinine of 0.6.  Sodium was 134, potassium 4.5, chloride of 98, CO2 of 25, calcium 9.4.

## 2018-02-25 DIAGNOSIS — H02412 Mechanical ptosis of left eyelid: Secondary | ICD-10-CM | POA: Diagnosis not present

## 2018-02-25 DIAGNOSIS — H02413 Mechanical ptosis of bilateral eyelids: Secondary | ICD-10-CM | POA: Diagnosis not present

## 2018-02-25 DIAGNOSIS — H02403 Unspecified ptosis of bilateral eyelids: Secondary | ICD-10-CM | POA: Diagnosis not present

## 2018-02-25 DIAGNOSIS — H02411 Mechanical ptosis of right eyelid: Secondary | ICD-10-CM | POA: Diagnosis not present

## 2018-03-23 DIAGNOSIS — M2550 Pain in unspecified joint: Secondary | ICD-10-CM | POA: Diagnosis not present

## 2018-03-23 DIAGNOSIS — M3390 Dermatopolymyositis, unspecified, organ involvement unspecified: Secondary | ICD-10-CM | POA: Diagnosis not present

## 2018-03-23 DIAGNOSIS — Z6829 Body mass index (BMI) 29.0-29.9, adult: Secondary | ICD-10-CM | POA: Diagnosis not present

## 2018-03-23 DIAGNOSIS — E663 Overweight: Secondary | ICD-10-CM | POA: Diagnosis not present

## 2018-03-23 DIAGNOSIS — D8989 Other specified disorders involving the immune mechanism, not elsewhere classified: Secondary | ICD-10-CM | POA: Diagnosis not present

## 2018-04-08 DIAGNOSIS — E663 Overweight: Secondary | ICD-10-CM | POA: Diagnosis not present

## 2018-04-08 DIAGNOSIS — Z6829 Body mass index (BMI) 29.0-29.9, adult: Secondary | ICD-10-CM | POA: Diagnosis not present

## 2018-04-08 DIAGNOSIS — M25511 Pain in right shoulder: Secondary | ICD-10-CM | POA: Diagnosis not present

## 2018-04-08 DIAGNOSIS — M3390 Dermatopolymyositis, unspecified, organ involvement unspecified: Secondary | ICD-10-CM | POA: Diagnosis not present

## 2018-04-20 ENCOUNTER — Other Ambulatory Visit: Payer: Self-pay | Admitting: Neurology

## 2018-04-24 DIAGNOSIS — Z23 Encounter for immunization: Secondary | ICD-10-CM | POA: Diagnosis not present

## 2018-04-30 ENCOUNTER — Telehealth: Payer: Self-pay | Admitting: Neurology

## 2018-04-30 ENCOUNTER — Other Ambulatory Visit: Payer: Self-pay | Admitting: Neurology

## 2018-04-30 NOTE — Telephone Encounter (Signed)
Pt states Dr Jannifer Franklin was to put all of her medications on 90 day refills, these have not been:gabapentin (NEURONTIN) 800 MG tablet, PHENobarbital (LUMINAL) 97.2 MG tablet and topiramate (TOPAMAX) 50 MG tablet   [place], [place] - [place] [phone] (Phone) [phone] (Fax)   Pt asking to be called if this can not be done

## 2018-04-30 NOTE — Telephone Encounter (Signed)
Spoke with pt. She does not actually need a refill of any of her medications right now.  Sts. she still has Gabapentin left. Topamax, 90 day rs., was sent in on 10/21, and she still has a full 90 day rx. of Phenobarb left to pick up at the pharmacy.Hilton Cork

## 2018-06-01 ENCOUNTER — Other Ambulatory Visit: Payer: Self-pay | Admitting: Neurology

## 2018-06-11 DIAGNOSIS — K760 Fatty (change of) liver, not elsewhere classified: Secondary | ICD-10-CM | POA: Diagnosis not present

## 2018-07-03 DIAGNOSIS — M859 Disorder of bone density and structure, unspecified: Secondary | ICD-10-CM | POA: Diagnosis not present

## 2018-07-03 DIAGNOSIS — R569 Unspecified convulsions: Secondary | ICD-10-CM | POA: Diagnosis not present

## 2018-07-03 DIAGNOSIS — E7849 Other hyperlipidemia: Secondary | ICD-10-CM | POA: Diagnosis not present

## 2018-07-03 DIAGNOSIS — R82998 Other abnormal findings in urine: Secondary | ICD-10-CM | POA: Diagnosis not present

## 2018-07-03 DIAGNOSIS — I10 Essential (primary) hypertension: Secondary | ICD-10-CM | POA: Diagnosis not present

## 2018-07-03 DIAGNOSIS — R7301 Impaired fasting glucose: Secondary | ICD-10-CM | POA: Diagnosis not present

## 2018-07-09 DIAGNOSIS — Z1212 Encounter for screening for malignant neoplasm of rectum: Secondary | ICD-10-CM | POA: Diagnosis not present

## 2018-07-10 DIAGNOSIS — R569 Unspecified convulsions: Secondary | ICD-10-CM | POA: Diagnosis not present

## 2018-07-10 DIAGNOSIS — Z1339 Encounter for screening examination for other mental health and behavioral disorders: Secondary | ICD-10-CM | POA: Diagnosis not present

## 2018-07-10 DIAGNOSIS — R350 Frequency of micturition: Secondary | ICD-10-CM | POA: Diagnosis not present

## 2018-07-10 DIAGNOSIS — M3390 Dermatopolymyositis, unspecified, organ involvement unspecified: Secondary | ICD-10-CM | POA: Diagnosis not present

## 2018-07-10 DIAGNOSIS — Z6829 Body mass index (BMI) 29.0-29.9, adult: Secondary | ICD-10-CM | POA: Diagnosis not present

## 2018-07-10 DIAGNOSIS — Z1389 Encounter for screening for other disorder: Secondary | ICD-10-CM | POA: Diagnosis not present

## 2018-07-10 DIAGNOSIS — R82998 Other abnormal findings in urine: Secondary | ICD-10-CM | POA: Diagnosis not present

## 2018-07-10 DIAGNOSIS — K921 Melena: Secondary | ICD-10-CM | POA: Diagnosis not present

## 2018-07-10 DIAGNOSIS — S32050D Wedge compression fracture of fifth lumbar vertebra, subsequent encounter for fracture with routine healing: Secondary | ICD-10-CM | POA: Diagnosis not present

## 2018-07-10 DIAGNOSIS — M2550 Pain in unspecified joint: Secondary | ICD-10-CM | POA: Diagnosis not present

## 2018-07-10 DIAGNOSIS — Z0000 Encounter for general adult medical examination without abnormal findings: Secondary | ICD-10-CM | POA: Diagnosis not present

## 2018-07-10 DIAGNOSIS — R531 Weakness: Secondary | ICD-10-CM | POA: Diagnosis not present

## 2018-07-10 DIAGNOSIS — Z23 Encounter for immunization: Secondary | ICD-10-CM | POA: Diagnosis not present

## 2018-07-10 DIAGNOSIS — Z1331 Encounter for screening for depression: Secondary | ICD-10-CM | POA: Diagnosis not present

## 2018-07-10 DIAGNOSIS — I700 Atherosclerosis of aorta: Secondary | ICD-10-CM | POA: Diagnosis not present

## 2018-07-10 DIAGNOSIS — K760 Fatty (change of) liver, not elsewhere classified: Secondary | ICD-10-CM | POA: Diagnosis not present

## 2018-07-14 DIAGNOSIS — M8589 Other specified disorders of bone density and structure, multiple sites: Secondary | ICD-10-CM | POA: Diagnosis not present

## 2018-07-14 DIAGNOSIS — M859 Disorder of bone density and structure, unspecified: Secondary | ICD-10-CM | POA: Diagnosis not present

## 2018-07-22 ENCOUNTER — Other Ambulatory Visit: Payer: Self-pay | Admitting: Neurology

## 2018-07-22 MED ORDER — PHENOBARBITAL 97.2 MG PO TABS: ORAL_TABLET | ORAL | 1 refills | Status: DC

## 2018-07-22 NOTE — Telephone Encounter (Signed)
Pt has requested a 90 day supply of PHENobarbital (LUMINAL) 97.2 MG tablet [facility] [address] in [place] with [employer] Prescription Card with [employer] GQ#[phone] RX [phone] RX GRP#[id number]

## 2018-07-22 NOTE — Addendum Note (Signed)
Addended by: Kathrynn Ducking on: 07/22/2018 05:20 PM   Modules accepted: Orders

## 2018-07-22 NOTE — Telephone Encounter (Signed)
The phenobarbital prescription was refilled.

## 2018-07-22 NOTE — Telephone Encounter (Signed)
Pt requesting a refill on Phenobarbital 97.2. Last refill was written on 05/01/2018 # 135 for a 90 day supply provided by our office. Last o/v 01/27/18 next f/u 02/01/19.

## 2018-11-02 DIAGNOSIS — R569 Unspecified convulsions: Secondary | ICD-10-CM | POA: Diagnosis not present

## 2018-11-02 DIAGNOSIS — R7301 Impaired fasting glucose: Secondary | ICD-10-CM | POA: Diagnosis not present

## 2018-11-02 DIAGNOSIS — I10 Essential (primary) hypertension: Secondary | ICD-10-CM | POA: Diagnosis not present

## 2018-11-02 DIAGNOSIS — M3390 Dermatopolymyositis, unspecified, organ involvement unspecified: Secondary | ICD-10-CM | POA: Diagnosis not present

## 2018-11-02 DIAGNOSIS — K760 Fatty (change of) liver, not elsewhere classified: Secondary | ICD-10-CM | POA: Diagnosis not present

## 2018-11-24 ENCOUNTER — Other Ambulatory Visit: Payer: Self-pay | Admitting: Neurology

## 2018-12-30 ENCOUNTER — Other Ambulatory Visit: Payer: Self-pay | Admitting: Internal Medicine

## 2018-12-30 DIAGNOSIS — Z1231 Encounter for screening mammogram for malignant neoplasm of breast: Secondary | ICD-10-CM

## 2019-01-05 DIAGNOSIS — E669 Obesity, unspecified: Secondary | ICD-10-CM | POA: Diagnosis not present

## 2019-01-05 DIAGNOSIS — Z6831 Body mass index (BMI) 31.0-31.9, adult: Secondary | ICD-10-CM | POA: Diagnosis not present

## 2019-01-05 DIAGNOSIS — M3390 Dermatopolymyositis, unspecified, organ involvement unspecified: Secondary | ICD-10-CM | POA: Diagnosis not present

## 2019-01-05 DIAGNOSIS — M25511 Pain in right shoulder: Secondary | ICD-10-CM | POA: Diagnosis not present

## 2019-01-08 DIAGNOSIS — H52203 Unspecified astigmatism, bilateral: Secondary | ICD-10-CM | POA: Diagnosis not present

## 2019-01-08 DIAGNOSIS — Z961 Presence of intraocular lens: Secondary | ICD-10-CM | POA: Diagnosis not present

## 2019-01-08 DIAGNOSIS — H353122 Nonexudative age-related macular degeneration, left eye, intermediate dry stage: Secondary | ICD-10-CM | POA: Diagnosis not present

## 2019-01-16 ENCOUNTER — Other Ambulatory Visit: Payer: Self-pay | Admitting: Neurology

## 2019-02-01 ENCOUNTER — Ambulatory Visit (INDEPENDENT_AMBULATORY_CARE_PROVIDER_SITE_OTHER): Payer: Medicare Other | Admitting: Neurology

## 2019-02-01 ENCOUNTER — Encounter: Payer: Self-pay | Admitting: Neurology

## 2019-02-01 ENCOUNTER — Other Ambulatory Visit: Payer: Self-pay

## 2019-02-01 VITALS — BP 142/75 | HR 76 | Temp 98.2°F | Ht <= 58 in | Wt 146.0 lb

## 2019-02-01 DIAGNOSIS — R569 Unspecified convulsions: Secondary | ICD-10-CM

## 2019-02-01 MED ORDER — TOPIRAMATE 50 MG PO TABS: ORAL_TABLET | ORAL | 3 refills | Status: DC

## 2019-02-01 NOTE — Progress Notes (Signed)
Reason for visit: History of seizures  Amber Bennett is an 68 y.o. female  History of present illness:  Amber Bennett is a 68 year old right-handed white female with history of seizures that have been well controlled on Topamax and phenobarbital.  The patient has not had any seizures since last seen.  She does have a history of low back pain that has worsened slightly as she is packing boxes to move.  She takes gabapentin as well for her low back and is on Cymbalta.  She returns to this office for an evaluation.  On an annual basis, her primary care physician checks the phenobarbital level.  The last level 1 year ago was 23.  Past Medical History:  Diagnosis Date  . 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 Carpal tunnel syndrome   . Cervical radiculitis   . Dermatomyositis ([place])   . Elevated liver enzymes   . Hematuria    negative evaluation  . High cholesterol   . HTN (hypertension)   . Lower back pain   . Macular degeneration   . Osteopenia    Left side hip  . Seizures ([name])     Past Surgical History:  Procedure Laterality Date  . ABDOMINAL HYSTERECTOMY  1982  . APPENDECTOMY    . BACK SURGERY    . BACK SURGERY  07/2015  . CARPAL TUNNEL RELEASE    . CHOLECYSTECTOMY  2003  . EXPLORATORY LAPAROTOMY    . LAMINECTOMY    . SHOULDER SURGERY Right 2000  . TONSILLECTOMY      Family History  Problem Relation Age of Onset  . Cancer Mother        Cancer of the bone marrow  . Stroke Sister   . Hypertension Sister   . Diabetes Sister     Social history:  reports that she quit smoking about 40 years ago. She has never used smokeless tobacco. She reports current alcohol use. She reports that she does not use drugs.    Allergies  Allergen Reactions  . Tylenol [Acetaminophen] Swelling and Other (See Comments)    Throat swelling, anaphylaxis  . Latex Itching    Rash     Medications:  Prior to Admission medications   Medication Sig Start Date End Date Taking? Authorizing  Provider  alendronate (FOSAMAX) 70 MG tablet Take 70 mg by mouth once a week.  10/22/13  Yes [provider]  BIOTIN PO Take by mouth.   Yes [provider]  Calcium Carbonate-Vitamin D (CALCIUM + D PO) Take by mouth daily.   Yes [provider]  Coenzyme Q10 (CO Q 10 PO) Take 100 mg by mouth daily.   Yes [provider]  Cyanocobalamin (VITAMIN B 12 PO) Take 1,000 mg by mouth daily.   Yes [provider]  DULoxetine (CYMBALTA) 30 MG capsule TK 1 C PO QPM 12/28/15  Yes [provider]  Evolocumab (REPATHA SURECLICK) 831 MG/ML SOAJ Inject into the skin. monthly   Yes [provider]  ezetimibe (ZETIA) 10 MG tablet Take 10 mg by mouth daily.   Yes [provider]  fluticasone (FLONASE) 50 MCG/ACT nasal spray Place 2 sprays into both nostrils daily.  03/18/13  Yes [provider]  gabapentin (NEURONTIN) 800 MG tablet TAKE 1 TABLET BY MOUTH TWICE DAILY 11/24/18  Yes Kathrynn Ducking, MD  loratadine (CLARITIN) 10 MG tablet Take 10 mg by mouth daily.   Yes [provider]  MAGNESIUM PO Take  by mouth.   Yes [provider]  montelukast (SINGULAIR) 10 MG tablet Take 10 mg by mouth daily. 11/21/12  Yes [provider]  Multiple Vitamins-Minerals (MULTIVITAMIN PO) Take by mouth daily.   Yes [provider]  Multiple Vitamins-Minerals (PRESERVISION AREDS PO) Take by mouth.   Yes [provider]  omega-3 acid ethyl esters (LOVAZA) 1 G capsule Take 1 capsule by mouth 4 (four) times daily.  11/08/12  Yes [provider]  omeprazole (PRILOSEC) 20 MG capsule TK 1 T PO QD 11/20/15  Yes [provider]  OZEMPIC 1 MG/DOSE SOPN INJECT 1 MG INTO THE SKIN ONCE A WEEK 01/05/18  Yes [provider]  PHENobarbital (LUMINAL) 97.2 MG tablet TAKE 1 AND 1/2 TABLETS BY MOUTH EVERY DAY 01/18/19  Yes Kathrynn Ducking, MD  Probiotic Product (PROBIOTIC PO) Take 1 Dose by mouth daily.    Yes [provider]  telmisartan (MICARDIS) 40 MG tablet Take 40 mg by mouth daily.  11/30/13  Yes [provider]  topiramate (TOPAMAX) 50 MG tablet TAKE 1 TABLET BY MOUTH IN THE MORNING AND 2 TABLETS BY MOUTH IN THE EVENING 04/20/18  Yes Kathrynn Ducking, MD  vitamin E 400 UNIT capsule Take 400 Units by mouth daily.   Yes [provider]    ROS:  Out of a complete 14 system review of symptoms, the patient complains only of the following symptoms, and all other reviewed systems are negative.  Low back pain  Blood pressure (!) 142/75, pulse 76, temperature 98.2 F (36.8 C), temperature source Temporal, height 4\' 10"  (1.473 m), weight 146 lb (66.2 kg), last menstrual period 07/01/1980.  Physical Exam  General: The patient is alert and cooperative at the time of the examination.  The patient is moderately obese.  Skin: No significant peripheral edema is noted.   Neurologic Exam  Mental status: The patient is alert and oriented x 3 at the time of the examination. The patient has apparent normal recent and remote memory, with an apparently normal attention span and concentration ability.   Cranial nerves: Facial symmetry is present. Speech is normal, no aphasia or dysarthria is noted. Extraocular movements are full. Visual fields are full.  Motor: The patient has good strength in all 4 extremities.  Sensory examination: Soft touch sensation is symmetric on the face, arms, and legs.  Coordination: The patient has good finger-nose-finger and heel-to-shin bilaterally.  Gait and station: The patient has a normal gait. Tandem gait is normal. Romberg is negative. No drift is seen.  Reflexes: Deep tendon reflexes are symmetric.   Assessment/Plan:  1.  History of seizures, well controlled  2.  Chronic low back pain  The patient will continue her Topamax, phenobarbital, and gabapentin.  A prescription for the Topamax was sent in.  The patient will follow-up  here in 1 year, sooner if needed.  Jill Alexanders MD 02/01/2019 12:40 PM  [facility] [address] [place], [place] [address]  Phone [phone] Fax [phone]

## 2019-02-17 ENCOUNTER — Other Ambulatory Visit: Payer: Self-pay

## 2019-02-17 ENCOUNTER — Ambulatory Visit
Admission: RE | Admit: 2019-02-17 | Discharge: 2019-02-17 | Disposition: A | Discharge status: Home / Self Care | Payer: Medicare Other | Source: Ambulatory Visit | Attending: Internal Medicine | Admitting: Internal Medicine

## 2019-02-17 DIAGNOSIS — Z1231 Encounter for screening mammogram for malignant neoplasm of breast: Secondary | ICD-10-CM | POA: Diagnosis not present

## 2019-02-25 DIAGNOSIS — R569 Unspecified convulsions: Secondary | ICD-10-CM | POA: Diagnosis not present

## 2019-02-26 DIAGNOSIS — D7589 Other specified diseases of blood and blood-forming organs: Secondary | ICD-10-CM | POA: Diagnosis not present

## 2019-03-04 DIAGNOSIS — I10 Essential (primary) hypertension: Secondary | ICD-10-CM | POA: Diagnosis not present

## 2019-03-04 DIAGNOSIS — R9431 Abnormal electrocardiogram [ECG] [EKG]: Secondary | ICD-10-CM | POA: Diagnosis not present

## 2019-03-04 DIAGNOSIS — R3121 Asymptomatic microscopic hematuria: Secondary | ICD-10-CM | POA: Diagnosis not present

## 2019-03-04 DIAGNOSIS — R0683 Snoring: Secondary | ICD-10-CM | POA: Diagnosis not present

## 2019-03-04 DIAGNOSIS — K760 Fatty (change of) liver, not elsewhere classified: Secondary | ICD-10-CM | POA: Diagnosis not present

## 2019-03-04 DIAGNOSIS — R809 Proteinuria, unspecified: Secondary | ICD-10-CM | POA: Diagnosis not present

## 2019-03-04 DIAGNOSIS — E876 Hypokalemia: Secondary | ICD-10-CM | POA: Diagnosis not present

## 2019-03-04 DIAGNOSIS — R945 Abnormal results of liver function studies: Secondary | ICD-10-CM | POA: Diagnosis not present

## 2019-03-04 DIAGNOSIS — E785 Hyperlipidemia, unspecified: Secondary | ICD-10-CM | POA: Diagnosis not present

## 2019-03-04 DIAGNOSIS — I700 Atherosclerosis of aorta: Secondary | ICD-10-CM | POA: Diagnosis not present

## 2019-03-04 DIAGNOSIS — R569 Unspecified convulsions: Secondary | ICD-10-CM | POA: Diagnosis not present

## 2019-03-04 DIAGNOSIS — S32050S Wedge compression fracture of fifth lumbar vertebra, sequela: Secondary | ICD-10-CM | POA: Diagnosis not present

## 2019-03-26 DIAGNOSIS — Z23 Encounter for immunization: Secondary | ICD-10-CM | POA: Diagnosis not present

## 2019-05-17 ENCOUNTER — Other Ambulatory Visit: Payer: Self-pay | Admitting: Neurology

## 2019-05-18 ENCOUNTER — Telehealth: Payer: Self-pay | Admitting: Neurology

## 2019-05-18 NOTE — Telephone Encounter (Signed)
Rx was sent on 05/17/2019/.

## 2019-05-18 NOTE — Telephone Encounter (Signed)
Pt called and LVM stating she is needing a refill on her gabapentin (NEURONTIN) 800 MG tablet sent in to the [facility] on [address]

## 2019-06-09 ENCOUNTER — Other Ambulatory Visit: Payer: Self-pay | Admitting: Neurology

## 2019-06-10 MED ORDER — GABAPENTIN 800 MG PO TABS: 800.0000 mg | ORAL_TABLET | Freq: Two times a day (BID) | ORAL | 1 refills | Status: DC

## 2019-06-10 NOTE — Telephone Encounter (Signed)
From the records, it looks like the gabapentin was resent today.

## 2019-06-10 NOTE — Telephone Encounter (Signed)
Pt called stating that the pharmacy is not wanting to fill this prescription and pt states that the pharmacy has not received this RX. Please advise.

## 2019-06-10 NOTE — Telephone Encounter (Signed)
I have send refill again. Pt notified and voiced appreciation for the call.

## 2019-06-10 NOTE — Addendum Note (Signed)
Addended by: Verlin Grills T on: 06/10/2019 01:09 PM   Modules accepted: Orders

## 2019-07-07 ENCOUNTER — Encounter: Payer: Self-pay | Admitting: Internal Medicine

## 2019-07-28 DIAGNOSIS — E7849 Other hyperlipidemia: Secondary | ICD-10-CM | POA: Diagnosis not present

## 2019-07-28 DIAGNOSIS — R7301 Impaired fasting glucose: Secondary | ICD-10-CM | POA: Diagnosis not present

## 2019-07-28 DIAGNOSIS — M8589 Other specified disorders of bone density and structure, multiple sites: Secondary | ICD-10-CM | POA: Diagnosis not present

## 2019-07-30 DIAGNOSIS — R809 Proteinuria, unspecified: Secondary | ICD-10-CM | POA: Diagnosis not present

## 2019-07-30 DIAGNOSIS — R82998 Other abnormal findings in urine: Secondary | ICD-10-CM | POA: Diagnosis not present

## 2019-08-04 DIAGNOSIS — K760 Fatty (change of) liver, not elsewhere classified: Secondary | ICD-10-CM | POA: Diagnosis not present

## 2019-08-04 DIAGNOSIS — R350 Frequency of micturition: Secondary | ICD-10-CM | POA: Diagnosis not present

## 2019-08-04 DIAGNOSIS — Z1339 Encounter for screening examination for other mental health and behavioral disorders: Secondary | ICD-10-CM | POA: Diagnosis not present

## 2019-08-04 DIAGNOSIS — K921 Melena: Secondary | ICD-10-CM | POA: Diagnosis not present

## 2019-08-04 DIAGNOSIS — I700 Atherosclerosis of aorta: Secondary | ICD-10-CM | POA: Diagnosis not present

## 2019-08-04 DIAGNOSIS — M2550 Pain in unspecified joint: Secondary | ICD-10-CM | POA: Diagnosis not present

## 2019-08-04 DIAGNOSIS — K625 Hemorrhage of anus and rectum: Secondary | ICD-10-CM | POA: Diagnosis not present

## 2019-08-04 DIAGNOSIS — J309 Allergic rhinitis, unspecified: Secondary | ICD-10-CM | POA: Diagnosis not present

## 2019-08-04 DIAGNOSIS — Z0000 Encounter for general adult medical examination without abnormal findings: Secondary | ICD-10-CM | POA: Diagnosis not present

## 2019-08-04 DIAGNOSIS — R945 Abnormal results of liver function studies: Secondary | ICD-10-CM | POA: Diagnosis not present

## 2019-08-04 DIAGNOSIS — R9431 Abnormal electrocardiogram [ECG] [EKG]: Secondary | ICD-10-CM | POA: Diagnosis not present

## 2019-08-04 DIAGNOSIS — S32050D Wedge compression fracture of fifth lumbar vertebra, subsequent encounter for fracture with routine healing: Secondary | ICD-10-CM | POA: Diagnosis not present

## 2019-08-04 DIAGNOSIS — Z1331 Encounter for screening for depression: Secondary | ICD-10-CM | POA: Diagnosis not present

## 2019-08-04 DIAGNOSIS — R569 Unspecified convulsions: Secondary | ICD-10-CM | POA: Diagnosis not present

## 2019-08-09 DIAGNOSIS — Z1212 Encounter for screening for malignant neoplasm of rectum: Secondary | ICD-10-CM | POA: Diagnosis not present

## 2019-08-11 ENCOUNTER — Encounter: Payer: Self-pay | Admitting: *Deleted

## 2019-08-12 ENCOUNTER — Ambulatory Visit: Payer: Medicare Other

## 2019-08-18 ENCOUNTER — Other Ambulatory Visit (INDEPENDENT_AMBULATORY_CARE_PROVIDER_SITE_OTHER): Payer: Medicare Other

## 2019-08-18 ENCOUNTER — Other Ambulatory Visit: Payer: Self-pay

## 2019-08-18 ENCOUNTER — Encounter: Payer: Self-pay | Admitting: Internal Medicine

## 2019-08-18 ENCOUNTER — Ambulatory Visit (INDEPENDENT_AMBULATORY_CARE_PROVIDER_SITE_OTHER): Payer: Medicare Other | Admitting: Internal Medicine

## 2019-08-18 VITALS — BP 137/77 | HR 80 | Temp 97.6°F | Ht <= 58 in | Wt 154.0 lb

## 2019-08-18 DIAGNOSIS — E785 Hyperlipidemia, unspecified: Secondary | ICD-10-CM | POA: Diagnosis not present

## 2019-08-18 DIAGNOSIS — K625 Hemorrhage of anus and rectum: Secondary | ICD-10-CM | POA: Diagnosis not present

## 2019-08-18 DIAGNOSIS — K529 Noninfective gastroenteritis and colitis, unspecified: Secondary | ICD-10-CM

## 2019-08-18 LAB — HIGH SENSITIVITY CRP: CRP, High Sensitivity: 4.15 mg/L (ref 0.000–5.000)

## 2019-08-18 LAB — IGA: IgA: 256 mg/dL (ref 68–378)

## 2019-08-18 MED ORDER — DIPHENOXYLATE-ATROPINE 2.5-0.025 MG PO TABS: 1.0000 | ORAL_TABLET | Freq: Four times a day (QID) | ORAL | 2 refills | Status: DC | PRN

## 2019-08-18 NOTE — Patient Instructions (Signed)
Your provider has requested that you go to the basement level for lab work before leaving today. Press "B" on the elevator. The lab is located at the first door on the left as you exit the elevator. _______________________________________________________________  HOLD your magnesium.  ______________________________________________________________  Dennis Bast have been scheduled for a colonoscopy. Please follow written instructions given to you at your visit today.  Please pick up your prep supplies at the pharmacy within the next 1-3 days. If you use inhalers (even only as needed), please bring them with you on the day of your procedure. Your physician has requested that you go to www.startemmi.com and enter the access code given to you at your visit today. This web site gives a general overview about your procedure. However, you should still follow specific instructions given to you by our office regarding your preparation for the procedure.  ______________________________________________________________  We have sent the following medications to your pharmacy for you to pick up at your convenience: Lomotil-1 tablet by mouth four times daily as needed for diarrhea  _______________________________________________________________  If you are age 71 or older, your body mass index should be between 23-30. Your Body mass index is 32.19 kg/m. If this is out of the aforementioned range listed, please consider follow up with your Primary Care Provider.  If you are age 71 or younger, your body mass index should be between 19-25. Your Body mass index is 32.19 kg/m. If this is out of the aformentioned range listed, please consider follow up with your Primary Care Provider.   _____________________________________________________________  Due to recent changes in healthcare laws, you may see the results of your imaging and laboratory studies on MyChart before your provider has had a chance to review them.   We understand that in some cases there may be results that are confusing or concerning to you. Not all laboratory results come back in the same time frame and the provider may be waiting for multiple results in order to interpret others.  Please give [place] 48 hours in order for your provider to thoroughly review all the results before contacting the office for clarification of your results.  ______________________________________________________________

## 2019-08-18 NOTE — Progress Notes (Signed)
Patient ID: Amber Bennett, female   DOB: Aug 31, 1950, 69 y.o.   MRN: [id number].  HPI: Amber Bennett is a 69 year old female with a past medical history of fatty liver (followed by Amber Bennett at [facility]), hypertension, hyperlipidemia, seizure disorder on phenobarbital (last seizure 2014), depression, history of back pain who is seen in consult from Amber Bennett to evaluate chronic diarrhea and blood in stool.  She is here today with her husband.  She reports that about 10 months ago, roughly April 2020, she developed loose stools and diarrhea.  This was a change for her having previously formed stools 1-2 times per day.  She is currently having 3-5 loose stools per day which can be urgent and often preceded by eating.  This has affected her activity as she worries about going out for dinner or interacting with friends due to the concern of having loose stools which are difficult to control.  Not associated with abdominal pain.  No weight loss in fact she has gained about 15 pounds which she attributes to less activity during the pandemic.  She has been unable to do water aerobics that she was previously doing before.  She is seeing red blood, painless in nature with wiping.  This comes and goes.  Not on the stool or in the toilet water that she is aware of.  She reports Amber Bennett did a Hemoccult which was reportedly negative.  She does not recall medication change or another viral illness around the time that the loose stool/diarrhea started.  I do not have her most recent lab work.  No upper GI or hepatobiliary complaint.  She does follow with Amber Bennett as above for treatment of fatty liver.  She is on vitamin E therapy for this.  She had been successful at losing weight previously by diet, primarily ketogenic diet and exercise.  She had normalized her liver enzymes.  She has been on longstanding omeprazole which she takes for gastric protection given her NSAID use.  She uses Aleve for back and shoulder pain.  She  had her second COVID-19 vaccination on 08/10/2019.  She reports having a colonoscopy 10 years ago with Amber Bennett which was normal and she was told to have repeat screening in 10 years.  She does not have a known family history of colorectal cancer.  Past Medical History:  Diagnosis Date  . Atherosclerosis of aorta ([place])   . 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 Carpal tunnel syndrome   . Cervical radiculitis   . Dermatomyositis ([place])   . Elevated liver enzymes   . Fatty liver   . Hematuria    negative evaluation  . High cholesterol   . HTN (hypertension)   . Lower back pain   . Macular degeneration   . Osteopenia    Left side hip  . Seizures ([place])     Past Surgical History:  Procedure Laterality Date  . ABDOMINAL HYSTERECTOMY  1982  . APPENDECTOMY    . BACK SURGERY    . BACK SURGERY  07/2015  . CARPAL TUNNEL RELEASE    . CATARACT EXTRACTION, BILATERAL    . CHOLECYSTECTOMY  2003  . EXPLORATORY LAPAROTOMY    . LAMINECTOMY    . SHOULDER SURGERY Right 2000  . TONSILLECTOMY      Outpatient Medications Prior to Visit  Medication Sig Dispense Refill  . amLODipine (NORVASC) 2.5 MG tablet Take 2.5 mg by mouth daily.    Marland Kitchen BIOTIN PO Take by mouth.    Marland Kitchen  Calcium Carbonate-Vitamin D (CALCIUM + D PO) Take by mouth daily.    . Coenzyme Q10 (CO Q 10 PO) Take 100 mg by mouth daily.    . Cyanocobalamin (VITAMIN B 12 PO) Take 1,000 mg by mouth daily.    . DULoxetine (CYMBALTA) 30 MG capsule TK 1 C PO QPM  5  . ezetimibe (ZETIA) 10 MG tablet Take 10 mg by mouth daily.    . fluticasone (FLONASE) 50 MCG/ACT nasal spray Place 2 sprays into both nostrils daily.     Marland Kitchen gabapentin (NEURONTIN) 800 MG tablet Take 1 tablet (800 mg total) by mouth 2 (two) times daily. 180 tablet 1  . loratadine (CLARITIN) 10 MG tablet Take 10 mg by mouth daily.    Marland Kitchen MAGNESIUM PO Take by mouth.    . montelukast (SINGULAIR) 10 MG tablet Take 10 mg by mouth daily.    . Multiple Vitamins-Minerals (MULTIVITAMIN PO) Take by  mouth daily.    . Multiple Vitamins-Minerals (PRESERVISION AREDS PO) Take by mouth.    . omega-3 acid ethyl esters (LOVAZA) 1 G capsule Take 1 capsule by mouth 4 (four) times daily.     Marland Kitchen omeprazole (PRILOSEC) 20 MG capsule TK 1 T PO QD  3  . OZEMPIC 1 MG/DOSE SOPN INJECT 1 MG INTO THE SKIN ONCE A WEEK  6  . PHENobarbital (LUMINAL) 97.2 MG tablet TAKE 1 AND 1/2 TABLETS BY MOUTH EVERY DAY 135 tablet 2  . potassium chloride (KLOR-CON) 20 MEQ packet Take by mouth 2 (two) times daily.    . Probiotic Product (PROBIOTIC PO) Take 1 Dose by mouth daily.    Marland Kitchen telmisartan (MICARDIS) 40 MG tablet Take 40 mg by mouth daily.     Marland Kitchen topiramate (TOPAMAX) 50 MG tablet TAKE 1 TABLET BY MOUTH IN THE MORNING AND 2 TABLETS BY MOUTH IN THE EVENING 270 tablet 3  . vitamin E 400 UNIT capsule Take 400 Units by mouth daily.    Marland Kitchen alendronate (FOSAMAX) 70 MG tablet Take 70 mg by mouth once a week.     . Evolocumab (REPATHA SURECLICK) [id number] MG/ML SOAJ Inject into the skin. monthly     No facility-administered medications prior to visit.    Allergies  Allergen Reactions  . Tylenol [Acetaminophen] Swelling and Other (See Comments)    Throat swelling, anaphylaxis  . Latex Itching    Rash     Family History  Problem Relation Age of Onset  . Cancer Mother        Cancer of the bone marrow  . Heart attack Mother   . Stroke Sister   . Hypertension Sister   . Diabetes Sister     Social History   Tobacco Use  . Smoking status: Former Smoker    Quit date: 07/01/1978    Years since quitting: 41.1  . Smokeless tobacco: Never Used  Substance Use Topics  . Alcohol use: Yes    Alcohol/week: 0.0 standard drinks    Comment: Consumes wine with dinner/socially  . Drug use: No    ROS: As per history of present illness, otherwise negative  BP 137/77   Pulse 80   Temp 97.6 F (36.4 C)   Ht 4\' 10"  (1.473 m)   Wt 154 lb (69.9 kg)   LMP 07/01/1980   SpO2 100%   BMI 32.19 kg/m  Constitutional: Well-developed and  well-nourished. No distress. HEENT: Normocephalic and atraumatic.  Conjunctivae are normal.  No scleral icterus. Neck: Neck supple. Trachea midline. Cardiovascular: Normal  rate, regular rhythm and intact distal pulses. Pulmonary/chest: Effort normal and breath sounds normal. No wheezing, rales or rhonchi. Abdominal: Soft, obese, nontender, nondistended. Bowel sounds active throughout. There are no masses palpable. No hepatosplenomegaly. Extremities: no clubbing, cyanosis, or edema Neurological: Alert and oriented to person place and time. Skin: Skin is warm and dry.  Psychiatric: Normal mood and affect. Behavior is normal.   ASSESSMENT/PLAN:  69 year old female with a past medical history of fatty liver (followed by Amber Bennett at [facility]), hypertension, hyperlipidemia, seizure disorder on phenobarbital (last seizure 2014), depression, history of back pain who is seen in consult from Amber Bennett to evaluate chronic diarrhea and blood in stool.   1.  Chronic diarrhea with rectal bleeding --I have recommended that we evaluate symptoms further and exclude infectious and inflammatory causes for her symptoms.  Fortunately she is not having abdominal pain nor has she lost weight.  Rectal exam is recommended but I will defer this to her upcoming colonoscopy.  I would like to start with stool studies and if negative proceed immediately to colonoscopy.  We discussed the risk, benefits and alternatives and she is agreeable and wishes to proceed.  She will not need COVID-19 preprocedure testing given that it will be beyond 14 days from her second vaccination.  I also advised that she hold her magnesium which is likely not the sole cause of her loose stools but may be contributing.  The rectal bleeding which is painless in nature and with wiping only may in fact be due to internal hemorrhoids but we will fully evaluate as below. --Celiac panel, CRP --GI pathogen panel, fecal elastase and fecal leukocytes --Once stool  studies are submitted she can use Lomotil 1 capsule 4 times daily as needed if she has activities outside the house limited by her loose stool. --Colonoscopy in the [place] as above    [id number], [address], [place],  [place] [address]

## 2019-08-19 LAB — TISSUE TRANSGLUTAMINASE, IGA: (tTG) Ab, IgA: 1 U/mL

## 2019-08-20 DIAGNOSIS — M67911 Unspecified disorder of synovium and tendon, right shoulder: Secondary | ICD-10-CM | POA: Diagnosis not present

## 2019-08-24 ENCOUNTER — Other Ambulatory Visit: Payer: Medicare Other

## 2019-08-24 DIAGNOSIS — K529 Noninfective gastroenteritis and colitis, unspecified: Secondary | ICD-10-CM

## 2019-08-24 DIAGNOSIS — K625 Hemorrhage of anus and rectum: Secondary | ICD-10-CM

## 2019-08-26 DIAGNOSIS — M25511 Pain in right shoulder: Secondary | ICD-10-CM | POA: Diagnosis not present

## 2019-08-27 ENCOUNTER — Encounter: Payer: Self-pay | Admitting: *Deleted

## 2019-08-27 ENCOUNTER — Other Ambulatory Visit: Payer: Medicare Other

## 2019-08-27 DIAGNOSIS — K625 Hemorrhage of anus and rectum: Secondary | ICD-10-CM

## 2019-08-27 DIAGNOSIS — K529 Noninfective gastroenteritis and colitis, unspecified: Secondary | ICD-10-CM

## 2019-08-30 ENCOUNTER — Encounter: Payer: Medicare Other | Admitting: Internal Medicine

## 2019-08-31 LAB — PANCREATIC ELASTASE, FECAL: Pancreatic Elastase-1, Stool: >500 mcg/g

## 2019-09-01 DIAGNOSIS — D224 Melanocytic nevi of scalp and neck: Secondary | ICD-10-CM | POA: Diagnosis not present

## 2019-09-01 DIAGNOSIS — D1801 Hemangioma of skin and subcutaneous tissue: Secondary | ICD-10-CM | POA: Diagnosis not present

## 2019-09-01 DIAGNOSIS — L820 Inflamed seborrheic keratosis: Secondary | ICD-10-CM | POA: Diagnosis not present

## 2019-09-01 DIAGNOSIS — L821 Other seborrheic keratosis: Secondary | ICD-10-CM | POA: Diagnosis not present

## 2019-09-01 DIAGNOSIS — D234 Other benign neoplasm of skin of scalp and neck: Secondary | ICD-10-CM | POA: Diagnosis not present

## 2019-09-01 DIAGNOSIS — D485 Neoplasm of uncertain behavior of skin: Secondary | ICD-10-CM | POA: Diagnosis not present

## 2019-09-01 DIAGNOSIS — D225 Melanocytic nevi of trunk: Secondary | ICD-10-CM | POA: Diagnosis not present

## 2019-09-01 DIAGNOSIS — L649 Androgenic alopecia, unspecified: Secondary | ICD-10-CM | POA: Diagnosis not present

## 2019-09-01 DIAGNOSIS — M19011 Primary osteoarthritis, right shoulder: Secondary | ICD-10-CM | POA: Diagnosis not present

## 2019-09-01 DIAGNOSIS — L812 Freckles: Secondary | ICD-10-CM | POA: Diagnosis not present

## 2019-09-03 DIAGNOSIS — M19011 Primary osteoarthritis, right shoulder: Secondary | ICD-10-CM | POA: Diagnosis not present

## 2019-09-03 DIAGNOSIS — M6281 Muscle weakness (generalized): Secondary | ICD-10-CM | POA: Diagnosis not present

## 2019-09-03 DIAGNOSIS — M25611 Stiffness of right shoulder, not elsewhere classified: Secondary | ICD-10-CM | POA: Diagnosis not present

## 2019-09-07 ENCOUNTER — Encounter: Payer: Self-pay | Admitting: Internal Medicine

## 2019-09-07 ENCOUNTER — Other Ambulatory Visit: Payer: Self-pay

## 2019-09-07 ENCOUNTER — Ambulatory Visit (AMBULATORY_SURGERY_CENTER): Payer: Medicare Other | Admitting: Internal Medicine

## 2019-09-07 VITALS — BP 151/67 | HR 69 | Temp 97.3°F | Resp 17 | Ht <= 58 in | Wt 154.0 lb

## 2019-09-07 DIAGNOSIS — D124 Benign neoplasm of descending colon: Secondary | ICD-10-CM | POA: Diagnosis not present

## 2019-09-07 DIAGNOSIS — K529 Noninfective gastroenteritis and colitis, unspecified: Secondary | ICD-10-CM

## 2019-09-07 DIAGNOSIS — K648 Other hemorrhoids: Secondary | ICD-10-CM

## 2019-09-07 DIAGNOSIS — K625 Hemorrhage of anus and rectum: Secondary | ICD-10-CM | POA: Diagnosis not present

## 2019-09-07 DIAGNOSIS — D125 Benign neoplasm of sigmoid colon: Secondary | ICD-10-CM | POA: Diagnosis not present

## 2019-09-07 MED ORDER — SODIUM CHLORIDE 0.9 % IV SOLN: 500.0000 mL | Freq: Once | INTRAVENOUS | Status: DC

## 2019-09-07 MED ORDER — HYDROCORTISONE ACETATE 25 MG RE SUPP: 25.0000 mg | Freq: Two times a day (BID) | RECTAL | 1 refills | Status: DC

## 2019-09-07 NOTE — Progress Notes (Signed)
Called to room to assist during endoscopic procedure.  Patient ID and intended procedure confirmed with present staff. Received instructions for my participation in the procedure from the performing physician.  

## 2019-09-07 NOTE — Progress Notes (Signed)
To Pacu, VSS. Report to [email or web address] 

## 2019-09-07 NOTE — Op Note (Signed)
[facility] Patient Name: Amber Bennett Procedure Date: 09/07/2019 3:22 PM MRN: [id number] Endoscopist: Jerene Bears , MD Age: 69 Referring MD:  Date of Birth: [birth year] Gender: Female Account #: [id number] Procedure:                Colonoscopy Indications:              Clinically significant diarrhea of unexplained                            origin (though improved with removing magnesium                            supplementation), Rectal bleeding, last colonoscopy                            10 years ago ([place]) Medicines:                Monitored Anesthesia Care Procedure:                Pre-Anesthesia Assessment:                           - Prior to the procedure, a History and Physical                            was performed, and patient medications and                            allergies were reviewed. The patient's tolerance of                            previous anesthesia was also reviewed. The risks                            and benefits of the procedure and the sedation                            options and risks were discussed with the patient.                            All questions were answered, and informed consent                            was obtained. Prior Anticoagulants: The patient has                            taken no previous anticoagulant or antiplatelet                            agents. ASA Grade Assessment: II - A patient with                            mild systemic disease. After reviewing the risks  and benefits, the patient was deemed in                            satisfactory condition to undergo the procedure.                           After obtaining informed consent, the colonoscope                            was passed under direct vision. Throughout the                            procedure, the patient's blood pressure, pulse, and                            oxygen saturations were monitored continuously.  The                            Colonoscope was introduced through the anus and                            advanced to the cecum, identified by appendiceal                            orifice and ileocecal valve. The colonoscopy was                            performed without difficulty. The patient tolerated                            the procedure well. The quality of the bowel                            preparation was good. The ileocecal valve,                            appendiceal orifice, and rectum were photographed. Scope In: 3:33:40 PM Scope Out: 3:55:32 PM Scope Withdrawal Time: 0 hours 14 minutes 0 seconds  Total Procedure Duration: 0 hours 21 minutes 52 seconds  Findings:                 Hemorrhoids were found on perianal exam.                           A 3 mm polyp was found in the descending colon. The                            polyp was sessile. The polyp was removed with a                            cold snare. Resection and retrieval were complete.                           A 3 mm polyp was found  in the sigmoid colon. The                            polyp was sessile. The polyp was removed with a                            cold snare. Resection and retrieval were complete.                           The exam was otherwise normal throughout the                            examined colon.                           Biopsies for histology were taken with a cold                            forceps from the right colon and left colon for                            evaluation of microscopic colitis.                           External and internal hemorrhoids were found during                            retroflexion, during perianal exam and during                            digital exam. The hemorrhoids were medium-sized. Complications:            No immediate complications. Estimated Blood Loss:     Estimated blood loss was minimal. Impression:               - One 3 mm polyp in  the descending colon, removed                            with a cold snare. Resected and retrieved.                           - One 3 mm polyp in the sigmoid colon, removed with                            a cold snare. Resected and retrieved.                           - External and internal hemorrhoids (predominantly                            internal hemorrhoids).                           - Biopsies were taken with a cold forceps from the  right colon and left colon for exclusion of                            microscopic colitis. Recommendation:           - Patient has a contact number available for                            emergencies. The signs and symptoms of potential                            delayed complications were discussed with the                            patient. Return to normal activities tomorrow.                            Written discharge instructions were provided to the                            patient.                           - Resume previous diet.                           - Continue present medications.                           - Await pathology results.                           - If persistent rectal bleeding hemorrhoidal                            banding performed in GI office would be a good                            option. Please let me know if you are interested or                            would like to further discuss hemorrhoidal banding.                           - Repeat colonoscopy may be recommended. The                            colonoscopy date will be determined after pathology                            results from today's exam become available for                            review. Jerene Bears, MD 09/07/2019 4:01:58 PM This report has been signed electronically.

## 2019-09-07 NOTE — Progress Notes (Signed)
Temp by AB Vitals by DT

## 2019-09-07 NOTE — Patient Instructions (Signed)
Read all of the handouts given to you by your recovery room nurse.  You will be given a pamphlet regarding the banding procedure.  YOU HAD AN ENDOSCOPIC PROCEDURE TODAY AT [facility]:   Refer to the procedure report that was given to you for any specific questions about what was found during the examination.  If the procedure report does not answer your questions, please call your gastroenterologist to clarify.  If you requested that your care partner not be given the details of your procedure findings, then the procedure report has been included in a sealed envelope for you to review at your convenience later.  YOU SHOULD EXPECT: Some feelings of bloating in the abdomen. Passage of more gas than usual.  Walking can help get rid of the air that was put into your GI tract during the procedure and reduce the bloating. If you had a lower endoscopy (such as a colonoscopy or flexible sigmoidoscopy) you may notice spotting of blood in your stool or on the toilet paper. If you underwent a bowel prep for your procedure, you may not have a normal bowel movement for a few days.  Please Note:  You might notice some irritation and congestion in your nose or some drainage.  This is from the oxygen used during your procedure.  There is no need for concern and it should clear up in a day or so.  SYMPTOMS TO REPORT IMMEDIATELY:   Following lower endoscopy (colonoscopy or flexible sigmoidoscopy):  Excessive amounts of blood in the stool  Significant tenderness or worsening of abdominal pains  Swelling of the abdomen that is new, acute  Fever of 100F or higher   For urgent or emergent issues, a gastroenterologist can be reached at any hour by calling [phone]. Do not use MyChart messaging for urgent concerns.    DIET:  We do recommend a small meal at first, but then you may proceed to your regular diet.  Drink plenty of fluids but you should avoid alcoholic beverages for 24 hours. Be  sure to drink plenty of water to prevent constipation.  ACTIVITY:  You should plan to take it easy for the rest of today and you should NOT DRIVE or use heavy machinery until tomorrow (because of the sedation medicines used during the test).    FOLLOW UP: Our staff will call the number listed on your records 48-72 hours following your procedure to check on you and address any questions or concerns that you may have regarding the information given to you following your procedure. If we do not reach you, we will leave a message.  We will attempt to reach you two times.  During this call, we will ask if you have developed any symptoms of COVID 19. If you develop any symptoms (ie: fever, flu-like symptoms, shortness of breath, cough etc.) before then, please call [phone].  If you test positive for Covid 19 in the 2 weeks post procedure, please call and report this information to [place].    If any biopsies were taken you will be contacted by phone or by letter within the next 1-3 weeks.  Please call us at [phone] if you have not heard about the biopsies in 3 weeks.    SIGNATURES/CONFIDENTIALITY: You and/or your care partner have signed paperwork which will be entered into your electronic medical record.  These signatures attest to the fact that that the information above on your After Visit Summary has been reviewed and is  understood.  Full responsibility of the confidentiality of this discharge information lies with you and/or your care-partner. 

## 2019-09-08 ENCOUNTER — Telehealth: Payer: Self-pay | Admitting: Internal Medicine

## 2019-09-08 ENCOUNTER — Other Ambulatory Visit: Payer: Self-pay

## 2019-09-08 ENCOUNTER — Other Ambulatory Visit: Payer: Medicare Other

## 2019-09-08 DIAGNOSIS — K625 Hemorrhage of anus and rectum: Secondary | ICD-10-CM | POA: Diagnosis not present

## 2019-09-08 DIAGNOSIS — M6281 Muscle weakness (generalized): Secondary | ICD-10-CM | POA: Diagnosis not present

## 2019-09-08 DIAGNOSIS — K529 Noninfective gastroenteritis and colitis, unspecified: Secondary | ICD-10-CM | POA: Diagnosis not present

## 2019-09-08 DIAGNOSIS — M25611 Stiffness of right shoulder, not elsewhere classified: Secondary | ICD-10-CM | POA: Diagnosis not present

## 2019-09-08 DIAGNOSIS — M19011 Primary osteoarthritis, right shoulder: Secondary | ICD-10-CM | POA: Diagnosis not present

## 2019-09-08 NOTE — Telephone Encounter (Signed)
Supp cancelled per pt request.

## 2019-09-08 NOTE — Telephone Encounter (Signed)
Patient called stated that Dr. Hilarie Fredrickson spoke with her about Hem Banding and\or suppository and after speaking with her husband they decided to proceed with the banding. I scheduled her for it but she wanted to know if you can cancel the order for the suppository.

## 2019-09-09 ENCOUNTER — Telehealth: Payer: Self-pay

## 2019-09-09 ENCOUNTER — Telehealth: Payer: Self-pay | Admitting: *Deleted

## 2019-09-09 NOTE — Telephone Encounter (Signed)
No answer for post procedure call back. Will call later today.

## 2019-09-09 NOTE — Telephone Encounter (Signed)
Covid-19 screening questions   Do you now or have you had a fever in the last 14 days? No.  Do you have any respiratory symptoms of shortness of breath or cough now or in the last 14 days? No.  Do you have any family members or close contacts with diagnosed or suspected Covid-19 in the past 14 days? No.  Have you been tested for Covid-19 and found to be positive? No.       Follow up Call-  Call back number 09/07/2019  Post procedure Call Back phone  # [phone]  Permission to leave phone message Yes  Some recent data might be hidden     Patient questions:  Do you have a fever, pain , or abdominal swelling? No. Pain Score  0 *  Have you tolerated food without any problems? Yes.    Have you been able to return to your normal activities? Yes.    Do you have any questions about your discharge instructions: Diet   No. Medications  No. Follow up visit  No.  Do you have questions or concerns about your Care? No.  Actions: * If pain score is 4 or above: No action needed, pain <4.

## 2019-09-10 LAB — FECAL LACTOFERRIN, QUANT
Fecal Lactoferrin: NEGATIVE
MICRO NUMBER:: 10235852
SPECIMEN QUALITY:: ADEQUATE

## 2019-09-10 LAB — GASTROINTESTINAL PATHOGEN PANEL PCR
C. difficile Tox A/B, PCR: NOT DETECTED
Campylobacter, PCR: NOT DETECTED
Cryptosporidium, PCR: NOT DETECTED
E coli (ETEC) LT/ST PCR: NOT DETECTED
E coli (STEC) stx1/stx2, PCR: NOT DETECTED
E coli 0157, PCR: NOT DETECTED
Giardia lamblia, PCR: NOT DETECTED
Norovirus, PCR: NOT DETECTED
Rotavirus A, PCR: NOT DETECTED
Salmonella, PCR: NOT DETECTED
Shigella, PCR: NOT DETECTED

## 2019-09-14 DIAGNOSIS — M25611 Stiffness of right shoulder, not elsewhere classified: Secondary | ICD-10-CM | POA: Diagnosis not present

## 2019-09-14 DIAGNOSIS — M6281 Muscle weakness (generalized): Secondary | ICD-10-CM | POA: Diagnosis not present

## 2019-09-14 DIAGNOSIS — M19011 Primary osteoarthritis, right shoulder: Secondary | ICD-10-CM | POA: Diagnosis not present

## 2019-09-17 DIAGNOSIS — M25611 Stiffness of right shoulder, not elsewhere classified: Secondary | ICD-10-CM | POA: Diagnosis not present

## 2019-09-17 DIAGNOSIS — M19011 Primary osteoarthritis, right shoulder: Secondary | ICD-10-CM | POA: Diagnosis not present

## 2019-09-17 DIAGNOSIS — M6281 Muscle weakness (generalized): Secondary | ICD-10-CM | POA: Diagnosis not present

## 2019-09-21 DIAGNOSIS — M25611 Stiffness of right shoulder, not elsewhere classified: Secondary | ICD-10-CM | POA: Diagnosis not present

## 2019-09-21 DIAGNOSIS — M19011 Primary osteoarthritis, right shoulder: Secondary | ICD-10-CM | POA: Diagnosis not present

## 2019-09-21 DIAGNOSIS — M6281 Muscle weakness (generalized): Secondary | ICD-10-CM | POA: Diagnosis not present

## 2019-09-23 DIAGNOSIS — M19011 Primary osteoarthritis, right shoulder: Secondary | ICD-10-CM | POA: Diagnosis not present

## 2019-09-23 DIAGNOSIS — M25611 Stiffness of right shoulder, not elsewhere classified: Secondary | ICD-10-CM | POA: Diagnosis not present

## 2019-09-23 DIAGNOSIS — M6281 Muscle weakness (generalized): Secondary | ICD-10-CM | POA: Diagnosis not present

## 2019-09-27 ENCOUNTER — Other Ambulatory Visit: Payer: Self-pay | Admitting: Neurology

## 2019-09-28 DIAGNOSIS — M19011 Primary osteoarthritis, right shoulder: Secondary | ICD-10-CM | POA: Diagnosis not present

## 2019-09-28 DIAGNOSIS — M25611 Stiffness of right shoulder, not elsewhere classified: Secondary | ICD-10-CM | POA: Diagnosis not present

## 2019-09-28 DIAGNOSIS — M6281 Muscle weakness (generalized): Secondary | ICD-10-CM | POA: Diagnosis not present

## 2019-09-30 ENCOUNTER — Encounter: Payer: Self-pay | Admitting: *Deleted

## 2019-09-30 DIAGNOSIS — M25611 Stiffness of right shoulder, not elsewhere classified: Secondary | ICD-10-CM | POA: Diagnosis not present

## 2019-09-30 DIAGNOSIS — M19011 Primary osteoarthritis, right shoulder: Secondary | ICD-10-CM | POA: Diagnosis not present

## 2019-09-30 DIAGNOSIS — M6281 Muscle weakness (generalized): Secondary | ICD-10-CM | POA: Diagnosis not present

## 2019-10-04 DIAGNOSIS — W010XXA Fall on same level from slipping, tripping and stumbling without subsequent striking against object, initial encounter: Secondary | ICD-10-CM | POA: Diagnosis not present

## 2019-10-04 DIAGNOSIS — S2242XA Multiple fractures of ribs, left side, initial encounter for closed fracture: Secondary | ICD-10-CM | POA: Diagnosis not present

## 2019-10-04 DIAGNOSIS — R0781 Pleurodynia: Secondary | ICD-10-CM | POA: Diagnosis not present

## 2019-10-11 ENCOUNTER — Ambulatory Visit: Payer: Medicare Other | Admitting: Internal Medicine

## 2019-10-27 DIAGNOSIS — M67911 Unspecified disorder of synovium and tendon, right shoulder: Secondary | ICD-10-CM | POA: Diagnosis not present

## 2019-12-02 DIAGNOSIS — K760 Fatty (change of) liver, not elsewhere classified: Secondary | ICD-10-CM | POA: Diagnosis not present

## 2019-12-07 ENCOUNTER — Encounter: Payer: Self-pay | Admitting: Internal Medicine

## 2019-12-07 ENCOUNTER — Ambulatory Visit (INDEPENDENT_AMBULATORY_CARE_PROVIDER_SITE_OTHER): Payer: Medicare Other | Admitting: Internal Medicine

## 2019-12-07 VITALS — BP 118/68 | HR 75 | Ht <= 58 in | Wt 150.0 lb

## 2019-12-07 DIAGNOSIS — R151 Fecal smearing: Secondary | ICD-10-CM

## 2019-12-07 DIAGNOSIS — K219 Gastro-esophageal reflux disease without esophagitis: Secondary | ICD-10-CM

## 2019-12-07 DIAGNOSIS — K648 Other hemorrhoids: Secondary | ICD-10-CM

## 2019-12-07 DIAGNOSIS — R195 Other fecal abnormalities: Secondary | ICD-10-CM

## 2019-12-07 NOTE — Patient Instructions (Addendum)
Please purchase the following medications over the counter and take as directed: Metamucil 1 tablespoon daily. ______________________________________ Amber Bennett have been scheduled for your 2nd hemorrhoidal banding on 12/28/19 at 4:00 pm. ______________________________________ Amber Bennett have been scheduled for your 3rd hemorrhoidal banding on 01/25/20 at 4:00 pm.  ______________________________________  HEMORRHOID BANDING PROCEDURE    FOLLOW-UP CARE   1. The procedure you have had should have been relatively painless since the banding of the area involved does not have nerve endings and there is no pain sensation.  The rubber band cuts off the blood supply to the hemorrhoid and the band may fall off as soon as 48 hours after the banding (the band may occasionally be seen in the toilet bowl following a bowel movement). You may notice a temporary feeling of fullness in the rectum which should respond adequately to plain Tylenol or Motrin.  2. Following the banding, avoid strenuous exercise that evening and resume full activity the next day.  A sitz bath (soaking in a warm tub) or bidet is soothing, and can be useful for cleansing the area after bowel movements.     3. To avoid constipation, take two tablespoons of natural wheat bran, natural oat bran, flax, Benefiber or any over the counter fiber supplement and increase your water intake to 7-8 glasses daily.    4. Unless you have been prescribed anorectal medication, do not put anything inside your rectum for two weeks: No suppositories, enemas, fingers, etc.  5. Occasionally, you may have more bleeding than usual after the banding procedure.  This is often from the untreated hemorrhoids rather than the treated one.  Don't be concerned if there is a tablespoon or so of blood.  If there is more blood than this, lie flat with your bottom higher than your head and apply an ice pack to the area. If the bleeding does not stop within a half an hour or if you  feel faint, call our office at [phone] or go to the emergency room.  6. Problems are not common; however, if there is a substantial amount of bleeding, severe pain, chills, fever or difficulty passing urine (very rare) or other problems, you should call us at [phone] or report to the nearest emergency room.  7. Do not stay seated continuously for more than 2-3 hours for a day or two after the procedure.  Tighten your buttock muscles 10-15 times every two hours and take 10-15 deep breaths every 1-2 hours.  Do not spend more than a few minutes on the toilet if you cannot empty your bowel; instead re-visit the toilet at a later time.   ____________________________________ If you are age 54 or older, your body mass index should be between 23-30. Your Body mass index is 31.35 kg/m. If this is out of the aforementioned range listed, please consider follow up with your Primary Care Provider.  If you are age 44 or younger, your body mass index should be between 19-25. Your Body mass index is 31.35 kg/m. If this is out of the aformentioned range listed, please consider follow up with your Primary Care Provider.  _____________________________________ Due to recent changes in healthcare laws, you may see the results of your imaging and laboratory studies on MyChart before your provider has had a chance to review them.  We understand that in some cases there may be results that are confusing or concerning to you. Not all laboratory results come back in the same time frame and the provider  may be waiting for multiple results in order to interpret others.  Please give [place] 48 hours in order for your provider to thoroughly review all the results before contacting the office for clarification of your results.

## 2019-12-07 NOTE — Progress Notes (Signed)
Amber Bennett is a 69 year old female with a history of symptomatic hemorrhoids, diarrhea, adenomatous colon polyps who is here for follow-up to consider hemorrhoidal banding.  She is here today with her husband.  I saw her in the office in February 2021 to evaluate chronic diarrhea with rectal bleeding.  This ended up being related to her magnesium and has improved tremendously since stopping magnesium.  She had colonoscopy on 09/07/2019 which showed hemorrhoids.  There were 2 polyps removed the largest being 3 mm.  Random biopsies were negative for microscopic colitis.  One of the 2 polyps was adenomatous.  She reports that she will occasionally have loose stools but by enlarge the diarrhea has resolved.  She does have continued symptomatic hemorrhoids  Internal hemorrhoid symptoms consist of: Hemorrhoidal bleeding, fecal smearing and discharge requiring her to wear a pad and occasional prolapse/irritation No prior treatment   PROCEDURE NOTE:  The patient presents with symptomatic grade 2 internal hemorrhoids, requesting rubber band ligation of her hemorrhoidal disease.  All risks, benefits and alternative forms of therapy were described and informed consent was obtained.   The anorectum was pre-medicated with 0.125% nitroglycerin ointment The decision was made to band the RP internal hemorrhoid, and the [place] was used to perform band ligation without complication.   Digital anorectal examination was then performed to assure proper positioning of the band, and to adjust the banded tissue as required.  The patient was discharged home without pain or other issues.  Dietary and behavioral recommendations were given and along with follow-up instructions.     The following adjunctive treatments were recommended: Begin Metamucil 1 heaping tablespoon daily which would be expected to help normalize bowel movements and help control loose stools as well as fecal smearing.  For GERD  continue Prilosec  The patient will return as scheduled for follow-up and possible additional banding as required. No complications were encountered and the patient tolerated the procedure well.

## 2019-12-15 DIAGNOSIS — M67911 Unspecified disorder of synovium and tendon, right shoulder: Secondary | ICD-10-CM | POA: Diagnosis not present

## 2019-12-28 ENCOUNTER — Encounter: Payer: Self-pay | Admitting: Internal Medicine

## 2019-12-28 ENCOUNTER — Ambulatory Visit (INDEPENDENT_AMBULATORY_CARE_PROVIDER_SITE_OTHER): Payer: Medicare Other | Admitting: Internal Medicine

## 2019-12-28 VITALS — BP 140/80 | HR 75 | Wt 151.4 lb

## 2019-12-28 DIAGNOSIS — K648 Other hemorrhoids: Secondary | ICD-10-CM

## 2019-12-28 DIAGNOSIS — R151 Fecal smearing: Secondary | ICD-10-CM

## 2019-12-28 NOTE — Patient Instructions (Signed)
You are scheduled for your 3rd hemorrhoidal banding on 01/25/20 at 4:00 pm.  HEMORRHOID BANDING PROCEDURE    FOLLOW-UP CARE   1. The procedure you have had should have been relatively painless since the banding of the area involved does not have nerve endings and there is no pain sensation.  The rubber band cuts off the blood supply to the hemorrhoid and the band may fall off as soon as 48 hours after the banding (the band may occasionally be seen in the toilet bowl following a bowel movement). You may notice a temporary feeling of fullness in the rectum which should respond adequately to plain Tylenol or Motrin.  2. Following the banding, avoid strenuous exercise that evening and resume full activity the next day.  A sitz bath (soaking in a warm tub) or bidet is soothing, and can be useful for cleansing the area after bowel movements.     3. To avoid constipation, take two tablespoons of natural wheat bran, natural oat bran, flax, Benefiber or any over the counter fiber supplement and increase your water intake to 7-8 glasses daily.    4. Unless you have been prescribed anorectal medication, do not put anything inside your rectum for two weeks: No suppositories, enemas, fingers, etc.  5. Occasionally, you may have more bleeding than usual after the banding procedure.  This is often from the untreated hemorrhoids rather than the treated one.  Don't be concerned if there is a tablespoon or so of blood.  If there is more blood than this, lie flat with your bottom higher than your head and apply an ice pack to the area. If the bleeding does not stop within a half an hour or if you feel faint, call our office at [phone] or go to the emergency room.  6. Problems are not common; however, if there is a substantial amount of bleeding, severe pain, chills, fever or difficulty passing urine (very rare) or other problems, you should call us at [phone] or report to the nearest emergency  room.  7. Do not stay seated continuously for more than 2-3 hours for a day or two after the procedure.  Tighten your buttock muscles 10-15 times every two hours and take 10-15 deep breaths every 1-2 hours.  Do not spend more than a few minutes on the toilet if you cannot empty your bowel; instead re-visit the toilet at a later time.   _________________________________________________________ If you are age 45 or older, your body mass index should be between 23-30. Your Body mass index is 31.64 kg/m. If this is out of the aforementioned range listed, please consider follow up with your Primary Care Provider.  If you are age 82 or younger, your body mass index should be between 19-25. Your Body mass index is 31.64 kg/m. If this is out of the aformentioned range listed, please consider follow up with your Primary Care Provider.  _________________________________________________ Due to recent changes in healthcare laws, you may see the results of your imaging and laboratory studies on MyChart before your provider has had a chance to review them.  We understand that in some cases there may be results that are confusing or concerning to you. Not all laboratory results come back in the same time frame and the provider may be waiting for multiple results in order to interpret others.  Please give [place] 48 hours in order for your provider to thoroughly review all the results before contacting the office for clarification of  your results.   

## 2019-12-28 NOTE — Progress Notes (Signed)
Amber Bennett is a 69 year old female with a history of symptomatic hemorrhoids, diarrhea, adenomatous colon polyps here for repeat hemorrhoidal banding.  She initially had her first hemorrhoidal banding on 12/07/2019 for symptomatic internal hemorrhoids Hemorrhoid symptoms prior to banding included bleeding, fecal smearing and discharge and occasional prolapse with perianal irritation  She reports no significant change in her hemorrhoidal symptoms after the initial band placement.  She is still having intermittent bleeding and prolapse symptoms as well as fecal smearing. At initial rubber band therapy on 12/07/2019 the right posterior internal hemorrhoid was banded   PROCEDURE NOTE:  The patient presents with symptomatic grade 2 internal hemorrhoids, requesting rubber band ligation of her hemorrhoidal disease.  All risks, benefits and alternative forms of therapy were described and informed consent was obtained.   The anorectum was pre-medicated with 0.125% nitroglycerin ointment The decision was made to band the LL internal hemorrhoid, and the [place] was used to perform band ligation without complication.   Digital anorectal examination was then performed to assure proper positioning of the band, and to adjust the banded tissue as required.  The patient was discharged home without pain or other issues.  Dietary and behavioral recommendations were given and along with follow-up instructions.     The following adjunctive treatments were recommended: --Continue your fiber supplementation with Metamucil  The patient will return as scheduled for  follow-up and possible additional banding as required. No complications were encountered and the patient tolerated the procedure well.

## 2020-01-25 ENCOUNTER — Ambulatory Visit (INDEPENDENT_AMBULATORY_CARE_PROVIDER_SITE_OTHER): Payer: Medicare Other | Admitting: Internal Medicine

## 2020-01-25 ENCOUNTER — Encounter: Payer: Self-pay | Admitting: Internal Medicine

## 2020-01-25 VITALS — BP 130/74 | HR 79 | Ht <= 58 in | Wt 152.0 lb

## 2020-01-25 DIAGNOSIS — K648 Other hemorrhoids: Secondary | ICD-10-CM | POA: Diagnosis not present

## 2020-01-25 NOTE — Progress Notes (Signed)
Amber Bennett is a 69 year old female with a history of symptomatic hemorrhoids, intermittent loose stool/diarrhea and adenomatous colon polyps  Here for third hemorrhoidal banding.  Hemorrhoidal banding performed on 12/07/2019 and 12/28/2019. Hemorrhoidal symptoms prior to treatment included rectal bleeding, fecal smearing and discharge and occasional prolapse with perianal irritation  Symptoms have improved though still some fecal smearing and minor bleeding   PROCEDURE NOTE:  The patient presents with symptomatic grade 2 internal hemorrhoids, requesting rubber band ligation of her hemorrhoidal disease.  All risks, benefits and alternative forms of therapy were described and informed consent was obtained.   The anorectum was pre-medicated with 0.125% nitroglycerin ointment The decision was made to band the RA (left lateral and right posterior banded previously) internal hemorrhoid, and the [name] was used to perform band ligation without complication.   Digital anorectal examination was then performed to assure proper positioning of the band, and to adjust the banded tissue as required.  The patient was discharged home without pain or other issues.  Dietary and behavioral recommendations were given and along with follow-up instructions.     The following adjunctive treatments were recommended: --Add Metamucil heaping tablespoon 1-2 times daily.  She reported not having done this consistently.  I do think this will help with loose stools and overall bowel consistency  The patient will return as needed for follow-up and possible additional banding as required. No complications were encountered and the patient tolerated the procedure well.

## 2020-01-25 NOTE — Patient Instructions (Signed)
Follow up as needed.  HEMORRHOID BANDING PROCEDURE    FOLLOW-UP CARE   1. The procedure you have had should have been relatively painless since the banding of the area involved does not have nerve endings and there is no pain sensation.  The rubber band cuts off the blood supply to the hemorrhoid and the band may fall off as soon as 48 hours after the banding (the band may occasionally be seen in the toilet bowl following a bowel movement). You may notice a temporary feeling of fullness in the rectum which should respond adequately to plain Tylenol or Motrin.  2. Following the banding, avoid strenuous exercise that evening and resume full activity the next day.  A sitz bath (soaking in a warm tub) or bidet is soothing, and can be useful for cleansing the area after bowel movements.     3. To avoid constipation, take two tablespoons of natural wheat bran, natural oat bran, flax, Benefiber or any over the counter fiber supplement and increase your water intake to 7-8 glasses daily.    4. Unless you have been prescribed anorectal medication, do not put anything inside your rectum for two weeks: No suppositories, enemas, fingers, etc.  5. Occasionally, you may have more bleeding than usual after the banding procedure.  This is often from the untreated hemorrhoids rather than the treated one.  Don't be concerned if there is a tablespoon or so of blood.  If there is more blood than this, lie flat with your bottom higher than your head and apply an ice pack to the area. If the bleeding does not stop within a half an hour or if you feel faint, call our office at [phone] or go to the emergency room.  6. Problems are not common; however, if there is a substantial amount of bleeding, severe pain, chills, fever or difficulty passing urine (very rare) or other problems, you should call us at [phone] or report to the nearest emergency room.  7. Do not stay seated continuously for more than  2-3 hours for a day or two after the procedure.  Tighten your buttock muscles 10-15 times every two hours and take 10-15 deep breaths every 1-2 hours.  Do not spend more than a few minutes on the toilet if you cannot empty your bowel; instead re-visit the toilet at a later time.  _________________________________________________________  If you are age 71 or older, your body mass index should be between 23-30. Your Body mass index is 31.77 kg/m. If this is out of the aforementioned range listed, please consider follow up with your Primary Care Provider.  If you are age 71 or younger, your body mass index should be between 19-25. Your Body mass index is 31.77 kg/m. If this is out of the aformentioned range listed, please consider follow up with your Primary Care Provider.   ________________________________________________________ Due to recent changes in healthcare laws, you may see the results of your imaging and laboratory studies on MyChart before your provider has had a chance to review them.  We understand that in some cases there may be results that are confusing or concerning to you. Not all laboratory results come back in the same time frame and the provider may be waiting for multiple results in order to interpret others.  Please give [place] 48 hours in order for your provider to thoroughly review all the results before contacting the office for clarification of your results.

## 2020-01-26 DIAGNOSIS — M25511 Pain in right shoulder: Secondary | ICD-10-CM | POA: Diagnosis not present

## 2020-01-26 DIAGNOSIS — M24811 Other specific joint derangements of right shoulder, not elsewhere classified: Secondary | ICD-10-CM | POA: Diagnosis not present

## 2020-01-30 ENCOUNTER — Other Ambulatory Visit: Payer: Self-pay | Admitting: Neurology

## 2020-02-02 ENCOUNTER — Encounter: Payer: Self-pay | Admitting: Adult Health

## 2020-02-02 ENCOUNTER — Ambulatory Visit (INDEPENDENT_AMBULATORY_CARE_PROVIDER_SITE_OTHER): Payer: Medicare Other | Admitting: Adult Health

## 2020-02-02 VITALS — BP 130/88 | HR 80 | Ht <= 58 in | Wt 153.0 lb

## 2020-02-02 DIAGNOSIS — R569 Unspecified convulsions: Secondary | ICD-10-CM

## 2020-02-02 MED ORDER — PHENOBARBITAL 97.2 MG PO TABS: ORAL_TABLET | ORAL | 1 refills | Status: DC

## 2020-02-02 MED ORDER — GABAPENTIN 800 MG PO TABS: ORAL_TABLET | ORAL | 3 refills | Status: DC

## 2020-02-02 MED ORDER — TOPIRAMATE 50 MG PO TABS: ORAL_TABLET | ORAL | 3 refills | Status: DC

## 2020-02-02 NOTE — Progress Notes (Signed)
I have read the note, and I agree with the clinical assessment and plan.  Mildred Tuccillo K Bannie Lobban   

## 2020-02-02 NOTE — Progress Notes (Signed)
PATIENT: Amber Bennett DOB: January 06, 1951  REASON FOR VISIT: follow up HISTORY FROM: patient  HISTORY OF PRESENT ILLNESS: Today 02/02/20:  Amber Bennett is a 69 year old female with a history of seizures.  She returns today for follow-up.  She remains on Topamax and phenobarbital.  Denies any seizure events.  No changes with her gait or balance.  She also has a history of low back pain.  She continues to take gabapentin and Cymbalta.  Reports that this continues to work well for her.  She returns today for an evaluation.  HISTORY Amber Bennett is a 69 year old right-handed white female with history of seizures that have been well controlled on Topamax and phenobarbital.  The patient has not had any seizures since last seen.  She does have a history of low back pain that has worsened slightly as she is packing boxes to move.  She takes gabapentin as well for her low back and is on Cymbalta.  She returns to this office for an evaluation.  On an annual basis, her primary care physician checks the phenobarbital level.  The last level 1 year ago was 23  REVIEW OF SYSTEMS: Out of a complete 14 system review of symptoms, the patient complains only of the following symptoms, and all other reviewed systems are negative.  ALLERGIES: Allergies  Allergen Reactions  . Tylenol [Acetaminophen] Swelling and Other (See Comments)    Throat swelling, anaphylaxis  . Latex Itching    Rash     HOME MEDICATIONS: Outpatient Medications Prior to Visit  Medication Sig Dispense Refill  . amLODipine (NORVASC) 2.5 MG tablet Take 2.5 mg by mouth daily.    Marland Kitchen BIOTIN PO Take by mouth.    . Calcium Carbonate-Vitamin D (CALCIUM + D PO) Take by mouth daily.    . Coenzyme Q10 (CO Q 10 PO) Take 100 mg by mouth daily.    . Cyanocobalamin (VITAMIN B 12 PO) Take 1,000 mg by mouth daily.    . diphenoxylate-atropine (LOMOTIL) 2.5-0.025 MG tablet Take 1 tablet by mouth 4 (four) times daily as needed for diarrhea or loose stools. 120  tablet 2  . DULoxetine (CYMBALTA) 30 MG capsule TK 1 C PO QPM  5  . ezetimibe (ZETIA) 10 MG tablet Take 10 mg by mouth daily.    . fluticasone (FLONASE) 50 MCG/ACT nasal spray Place 2 sprays into both nostrils daily.     Marland Kitchen gabapentin (NEURONTIN) 800 MG tablet TAKE 1 TABLET(800 MG) BY MOUTH TWICE DAILY 180 tablet 1  . loratadine (CLARITIN) 10 MG tablet Take 10 mg by mouth daily.    Marland Kitchen MAGNESIUM PO Take by mouth.    . montelukast (SINGULAIR) 10 MG tablet Take 10 mg by mouth daily.    . Multiple Vitamins-Minerals (MULTIVITAMIN PO) Take by mouth daily.    . Multiple Vitamins-Minerals (PRESERVISION AREDS PO) Take by mouth.    . omega-3 acid ethyl esters (LOVAZA) 1 G capsule Take 1 capsule by mouth 4 (four) times daily.     Marland Kitchen omeprazole (PRILOSEC) 20 MG capsule TK 1 T PO QD  3  . OZEMPIC 1 MG/DOSE SOPN INJECT 1 MG INTO THE SKIN ONCE A WEEK  6  . PHENobarbital (LUMINAL) 97.2 MG tablet TAKE 1 AND 1/2 TABLET BY MOUTH EVERY DAY 135 tablet 1  . potassium chloride (KLOR-CON) 20 MEQ packet Take by mouth 2 (two) times daily.    . Probiotic Product (PROBIOTIC PO) Take 1 Dose by mouth daily.    Marland Kitchen  telmisartan (MICARDIS) 40 MG tablet Take 40 mg by mouth daily.     Marland Kitchen topiramate (TOPAMAX) 50 MG tablet TAKE 1 TABLET BY MOUTH IN THE MORNING AND 2 TABLETS BY MOUTH IN THE EVENING 270 tablet 3  . vitamin E 400 UNIT capsule Take 400 Units by mouth daily.     No facility-administered medications prior to visit.    PAST MEDICAL HISTORY: Past Medical History:  Diagnosis Date  . Atherosclerosis of aorta ([place])   . 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 Carpal tunnel syndrome   . Cervical radiculitis   . Dermatomyositis ([place])   . Elevated liver enzymes   . External hemorrhoids   . Fatty liver   . Hematuria    negative evaluation  . High cholesterol   . HTN (hypertension)   . Internal hemorrhoids   . Lower back pain   . Macular degeneration   . Osteopenia    Left side hip  . Seizures ([place])    09/07/2019 pt  reports last seizure in 2014  . Tubular adenoma of colon     PAST SURGICAL HISTORY: Past Surgical History:  Procedure Laterality Date  . ABDOMINAL HYSTERECTOMY  1982  . APPENDECTOMY    . BACK SURGERY    . BACK SURGERY  07/2015  . CARPAL TUNNEL RELEASE    . CATARACT EXTRACTION, BILATERAL    . CHOLECYSTECTOMY  2003  . COLONOSCOPY    . EXPLORATORY LAPAROTOMY    . LAMINECTOMY    . SHOULDER SURGERY Right 2000  . TONSILLECTOMY      FAMILY HISTORY: Family History  Problem Relation Age of Onset  . Cancer Mother        Cancer of the bone marrow  . Heart attack Mother   . Stroke Sister   . Hypertension Sister   . Diabetes Sister   . Colon cancer Maternal Aunt     SOCIAL HISTORY: Social History   Socioeconomic History  . Marital status: Married    Spouse name: Not on file  . Number of children: 2  . Years of education: 62  . Highest education level: Not on file  Occupational History  . Occupation: Employed    Comment: Works for [employer], [employer] and [employer]  Tobacco Use  . Smoking status: Former Smoker    Quit date: 07/01/1978    Years since quitting: 41.6  . Smokeless tobacco: Never Used  Vaping Use  . Vaping Use: Never used  Substance and Sexual Activity  . Alcohol use: Yes    Alcohol/week: 7.0 standard drinks    Types: 7 Glasses of wine per week    Comment: Consumes wine with dinner/socially  . Drug use: No  . Sexual activity: Yes    Partners: Male    Birth control/protection: Surgical    Comment: TAH  Other Topics Concern  . Not on file  Social History Narrative   Patient lives at home with her husband Zada Finders).   Retired.   Education two years of business college.   Right handed.   Caffeine     Social Determinants of Health   Financial Resource Strain:   . Difficulty of Paying Living Expenses:   Food Insecurity:   . Worried About Charity fundraiser in the Last Year:   . Arboriculturist in the Last Year:   Transportation Needs:   . Lexicographer (Medical):   Marland Kitchen Lack of Transportation (Non-Medical):   Physical Activity:   .  Days of Exercise per Week:   . Minutes of Exercise per Session:   Stress:   . Feeling of Stress :   Social Connections:   . Frequency of Communication with Friends and Family:   . Frequency of Social Gatherings with Friends and Family:   . Attends Religious Services:   . Active Member of Clubs or Organizations:   . Attends Archivist Meetings:   Marland Kitchen Marital Status:   Intimate Partner Violence:   . Fear of Current or Ex-Partner:   . Emotionally Abused:   Marland Kitchen Physically Abused:   . Sexually Abused:       PHYSICAL EXAM  There were no vitals filed for this visit. There is no height or weight on file to calculate BMI.  Generalized: Well developed, in no acute distress   Neurological examination  Mentation: Alert oriented to time, place, history taking. Follows all commands speech and language fluent Cranial nerve II-XII: Pupils were equal round reactive to light. Extraocular movements were full, visual field were full on confrontational test. Facial sensation and strength were normal. Uvula tongue midline. Head turning and shoulder shrug  were normal and symmetric. Motor: The motor testing reveals 5 over 5 strength of all 4 extremities. Good symmetric motor tone is noted throughout.  Sensory: Sensory testing is intact to soft touch on all 4 extremities. No evidence of extinction is noted.  Coordination: Cerebellar testing reveals good finger-nose-finger and heel-to-shin bilaterally.  Gait and station: Gait is normal. Tandem gait is normal. Romberg is negative. No drift is seen.  Reflexes: Deep tendon reflexes are symmetric and normal bilaterally.   DIAGNOSTIC DATA (LABS, IMAGING, TESTING) - I reviewed patient records, labs, notes, testing and imaging myself where available.     ASSESSMENT AND PLAN 69 y.o. year old female  has a past medical history of Atherosclerosis of aorta  ([place]), Bertolotti's syndrome, Carpal tunnel syndrome, Cervical radiculitis, Dermatomyositis ([place]), Elevated liver enzymes, External hemorrhoids, Fatty liver, Hematuria, High cholesterol, HTN (hypertension), Internal hemorrhoids, Lower back pain, Macular degeneration, Osteopenia, Seizures ([place]), and Tubular adenoma of colon. here with:  Seizures   Continue phenobarbital and Topamax  Levels are checked by her PCP  Low back pain   Continue Cymbalta and gabapentin  Advised if symptoms worsen or she develops new symptoms she should let us know follow-up in 1 year or sooner if needed  I spent 20 minutes of face-to-face and non-face-to-face time with patient.  This included previsit chart review, lab review, study review, order entry, electronic health record documentation, patient education.  Ward Givens, MSN, NP-C 02/02/2020, 11:25 AM [facility] [address] [place], [place] [address] [phone]

## 2020-02-02 NOTE — Patient Instructions (Signed)
Your Plan:  Continue Phenobarbital and Topamax for seizures Continue Cymbalta and gabapentin for low back pain    Thank you for coming to see [place] at [facility]. I hope we have been able to provide you high quality care today.  You may receive a patient satisfaction survey over the next few weeks. We would appreciate your feedback and comments so that we may continue to improve ourselves and the health of our patients.

## 2020-03-01 DIAGNOSIS — Z23 Encounter for immunization: Secondary | ICD-10-CM | POA: Diagnosis not present

## 2020-04-11 ENCOUNTER — Telehealth: Payer: Self-pay | Admitting: Adult Health

## 2020-04-11 ENCOUNTER — Other Ambulatory Visit: Payer: Self-pay | Admitting: Neurology

## 2020-04-11 NOTE — Telephone Encounter (Signed)
Pt states the pharmacy is denying her PHENobarbital (LUMINAL) 97.2 MG tablet , she said she is normally given enough refills until next appointment date, please call re: the need of a refill

## 2020-04-11 NOTE — Addendum Note (Signed)
Addended by: Brandon Melnick on: 04/11/2020 04:41 PM   Modules accepted: Orders

## 2020-04-12 DIAGNOSIS — Z23 Encounter for immunization: Secondary | ICD-10-CM | POA: Diagnosis not present

## 2020-04-12 MED ORDER — PHENOBARBITAL 97.2 MG PO TABS: ORAL_TABLET | ORAL | 1 refills | Status: DC

## 2020-04-12 NOTE — Addendum Note (Signed)
Addended by: Trudie Buckler on: 04/12/2020 05:06 PM   Modules accepted: Orders

## 2020-04-25 ENCOUNTER — Other Ambulatory Visit: Payer: Self-pay | Admitting: Neurology

## 2020-05-04 ENCOUNTER — Telehealth: Payer: Self-pay | Admitting: Adult Health

## 2020-05-04 NOTE — Telephone Encounter (Signed)
Pt is requesting a refill for topiramate (TOPAMAX) 50 MG tablet 90 day auto refill  to [facility][id number] .

## 2020-05-04 NOTE — Telephone Encounter (Signed)
Noted that pt has 2 refills 90 day supply at the [facility] [address] from 01-2020. Spoke to pharmacist and they do have refills.  (question of transfer to market previously but refill available).  She will get ready and call pt.

## 2020-05-11 ENCOUNTER — Other Ambulatory Visit: Payer: Self-pay | Admitting: *Deleted

## 2020-05-11 MED ORDER — DIPHENOXYLATE-ATROPINE 2.5-0.025 MG PO TABS: 1.0000 | ORAL_TABLET | Freq: Four times a day (QID) | ORAL | 2 refills | Status: DC | PRN

## 2020-06-06 ENCOUNTER — Other Ambulatory Visit: Payer: Self-pay | Admitting: Internal Medicine

## 2020-06-06 DIAGNOSIS — Z1231 Encounter for screening mammogram for malignant neoplasm of breast: Secondary | ICD-10-CM

## 2020-07-05 ENCOUNTER — Other Ambulatory Visit: Payer: Self-pay | Admitting: Adult Health

## 2020-07-05 ENCOUNTER — Other Ambulatory Visit: Payer: Self-pay | Admitting: Neurology

## 2020-07-05 NOTE — Telephone Encounter (Signed)
Pt. is requesting refills for gabapentin (NEURONTIN) 800 MG tablet & PHENobarbital (LUMINAL) 97.2 MG tablet.  Pharmacy: [facility] [phone]

## 2020-07-05 NOTE — Telephone Encounter (Addendum)
Called pharmacy, long wait time. Will -try later. Calle and still waittime is long.  Pt has appt 01-2021.

## 2020-07-06 MED ORDER — PHENOBARBITAL 97.2 MG PO TABS: ORAL_TABLET | ORAL | 1 refills | Status: DC

## 2020-07-06 MED ORDER — GABAPENTIN 800 MG PO TABS: ORAL_TABLET | ORAL | 1 refills | Status: DC

## 2020-07-06 NOTE — Addendum Note (Signed)
Addended by: Guy Begin on: 07/06/2020 01:39 PM   Modules accepted: Orders

## 2020-07-19 ENCOUNTER — Other Ambulatory Visit: Payer: Self-pay

## 2020-07-19 ENCOUNTER — Ambulatory Visit
Admission: RE | Admit: 2020-07-19 | Discharge: 2020-07-19 | Disposition: A | Discharge status: Home / Self Care | Payer: Medicare Other | Source: Ambulatory Visit | Attending: Internal Medicine | Admitting: Internal Medicine

## 2020-07-19 DIAGNOSIS — Z1231 Encounter for screening mammogram for malignant neoplasm of breast: Secondary | ICD-10-CM | POA: Diagnosis not present

## 2020-08-04 DIAGNOSIS — E119 Type 2 diabetes mellitus without complications: Secondary | ICD-10-CM | POA: Diagnosis not present

## 2020-08-04 DIAGNOSIS — H5211 Myopia, right eye: Secondary | ICD-10-CM | POA: Diagnosis not present

## 2020-08-04 DIAGNOSIS — Z961 Presence of intraocular lens: Secondary | ICD-10-CM | POA: Diagnosis not present

## 2020-08-04 DIAGNOSIS — H353122 Nonexudative age-related macular degeneration, left eye, intermediate dry stage: Secondary | ICD-10-CM | POA: Diagnosis not present

## 2020-08-17 DIAGNOSIS — E785 Hyperlipidemia, unspecified: Secondary | ICD-10-CM | POA: Diagnosis not present

## 2020-08-17 DIAGNOSIS — M8589 Other specified disorders of bone density and structure, multiple sites: Secondary | ICD-10-CM | POA: Diagnosis not present

## 2020-08-17 DIAGNOSIS — R7301 Impaired fasting glucose: Secondary | ICD-10-CM | POA: Diagnosis not present

## 2020-08-17 DIAGNOSIS — M859 Disorder of bone density and structure, unspecified: Secondary | ICD-10-CM | POA: Diagnosis not present

## 2020-08-17 DIAGNOSIS — R569 Unspecified convulsions: Secondary | ICD-10-CM | POA: Diagnosis not present

## 2020-08-23 DIAGNOSIS — I700 Atherosclerosis of aorta: Secondary | ICD-10-CM | POA: Diagnosis not present

## 2020-08-23 DIAGNOSIS — M8589 Other specified disorders of bone density and structure, multiple sites: Secondary | ICD-10-CM | POA: Diagnosis not present

## 2020-08-23 DIAGNOSIS — M2550 Pain in unspecified joint: Secondary | ICD-10-CM | POA: Diagnosis not present

## 2020-08-23 DIAGNOSIS — E785 Hyperlipidemia, unspecified: Secondary | ICD-10-CM | POA: Diagnosis not present

## 2020-08-23 DIAGNOSIS — R945 Abnormal results of liver function studies: Secondary | ICD-10-CM | POA: Diagnosis not present

## 2020-08-23 DIAGNOSIS — R82998 Other abnormal findings in urine: Secondary | ICD-10-CM | POA: Diagnosis not present

## 2020-08-23 DIAGNOSIS — J309 Allergic rhinitis, unspecified: Secondary | ICD-10-CM | POA: Diagnosis not present

## 2020-08-23 DIAGNOSIS — H3530 Unspecified macular degeneration: Secondary | ICD-10-CM | POA: Diagnosis not present

## 2020-08-23 DIAGNOSIS — R7301 Impaired fasting glucose: Secondary | ICD-10-CM | POA: Diagnosis not present

## 2020-08-23 DIAGNOSIS — M3390 Dermatopolymyositis, unspecified, organ involvement unspecified: Secondary | ICD-10-CM | POA: Diagnosis not present

## 2020-08-23 DIAGNOSIS — K760 Fatty (change of) liver, not elsewhere classified: Secondary | ICD-10-CM | POA: Diagnosis not present

## 2020-08-23 DIAGNOSIS — Z0000 Encounter for general adult medical examination without abnormal findings: Secondary | ICD-10-CM | POA: Diagnosis not present

## 2020-08-23 DIAGNOSIS — I10 Essential (primary) hypertension: Secondary | ICD-10-CM | POA: Diagnosis not present

## 2020-08-30 DIAGNOSIS — I788 Other diseases of capillaries: Secondary | ICD-10-CM | POA: Diagnosis not present

## 2020-08-30 DIAGNOSIS — M3390 Dermatopolymyositis, unspecified, organ involvement unspecified: Secondary | ICD-10-CM | POA: Diagnosis not present

## 2020-09-01 DIAGNOSIS — R3121 Asymptomatic microscopic hematuria: Secondary | ICD-10-CM | POA: Diagnosis not present

## 2020-09-14 DIAGNOSIS — R3121 Asymptomatic microscopic hematuria: Secondary | ICD-10-CM | POA: Diagnosis not present

## 2020-09-14 DIAGNOSIS — R3915 Urgency of urination: Secondary | ICD-10-CM | POA: Diagnosis not present

## 2020-09-14 DIAGNOSIS — N3943 Post-void dribbling: Secondary | ICD-10-CM | POA: Diagnosis not present

## 2020-09-14 DIAGNOSIS — R350 Frequency of micturition: Secondary | ICD-10-CM | POA: Diagnosis not present

## 2020-09-25 DIAGNOSIS — R3121 Asymptomatic microscopic hematuria: Secondary | ICD-10-CM | POA: Diagnosis not present

## 2020-09-25 DIAGNOSIS — R3129 Other microscopic hematuria: Secondary | ICD-10-CM | POA: Diagnosis not present

## 2020-09-25 DIAGNOSIS — K439 Ventral hernia without obstruction or gangrene: Secondary | ICD-10-CM | POA: Diagnosis not present

## 2020-09-25 DIAGNOSIS — S32009A Unspecified fracture of unspecified lumbar vertebra, initial encounter for closed fracture: Secondary | ICD-10-CM | POA: Diagnosis not present

## 2020-09-25 DIAGNOSIS — N281 Cyst of kidney, acquired: Secondary | ICD-10-CM | POA: Diagnosis not present

## 2020-10-03 DIAGNOSIS — R3121 Asymptomatic microscopic hematuria: Secondary | ICD-10-CM | POA: Diagnosis not present

## 2020-10-03 DIAGNOSIS — N3943 Post-void dribbling: Secondary | ICD-10-CM | POA: Diagnosis not present

## 2020-10-07 DIAGNOSIS — Z23 Encounter for immunization: Secondary | ICD-10-CM | POA: Diagnosis not present

## 2020-10-07 DIAGNOSIS — Z20822 Contact with and (suspected) exposure to covid-19: Secondary | ICD-10-CM | POA: Diagnosis not present

## 2020-10-14 ENCOUNTER — Other Ambulatory Visit: Payer: Self-pay | Admitting: Adult Health

## 2020-10-16 ENCOUNTER — Telehealth: Payer: Self-pay | Admitting: Orthopaedic Surgery

## 2020-10-16 NOTE — Telephone Encounter (Signed)
Pt wanted to make ashley aware she got an appt scheduled for 10/26/20

## 2020-10-18 ENCOUNTER — Telehealth: Payer: Self-pay | Admitting: Adult Health

## 2020-10-18 MED ORDER — GABAPENTIN 800 MG PO TABS: ORAL_TABLET | ORAL | 1 refills | Status: DC

## 2020-10-18 NOTE — Addendum Note (Signed)
Addended by: Oliver Hum S on: 10/18/2020 04:00 PM   Modules accepted: Orders

## 2020-10-18 NOTE — Telephone Encounter (Signed)
Pt is needing a refill on her gabapentin (NEURONTIN) 800 MG tablet sent in to the [facility] on [address]

## 2020-10-26 ENCOUNTER — Ambulatory Visit: Payer: Medicare Other | Admitting: Orthopaedic Surgery

## 2020-11-01 ENCOUNTER — Other Ambulatory Visit: Payer: Self-pay

## 2020-11-01 ENCOUNTER — Ambulatory Visit (INDEPENDENT_AMBULATORY_CARE_PROVIDER_SITE_OTHER): Payer: Medicare Other

## 2020-11-01 ENCOUNTER — Encounter: Payer: Self-pay | Admitting: Orthopaedic Surgery

## 2020-11-01 ENCOUNTER — Ambulatory Visit (INDEPENDENT_AMBULATORY_CARE_PROVIDER_SITE_OTHER): Payer: Medicare Other | Admitting: Orthopaedic Surgery

## 2020-11-01 VITALS — Ht <= 58 in | Wt 154.6 lb

## 2020-11-01 DIAGNOSIS — M545 Low back pain, unspecified: Secondary | ICD-10-CM

## 2020-11-01 DIAGNOSIS — M25552 Pain in left hip: Secondary | ICD-10-CM | POA: Diagnosis not present

## 2020-11-01 DIAGNOSIS — M25551 Pain in right hip: Secondary | ICD-10-CM

## 2020-11-01 DIAGNOSIS — G8929 Other chronic pain: Secondary | ICD-10-CM

## 2020-11-01 DIAGNOSIS — M7061 Trochanteric bursitis, right hip: Secondary | ICD-10-CM

## 2020-11-01 MED ORDER — METHYLPREDNISOLONE ACETATE 40 MG/ML IJ SUSP
40.0000 mg | INTRAMUSCULAR | Status: AC | PRN
  Administered 2020-11-01: 40 mg via INTRA_ARTICULAR

## 2020-11-01 MED ORDER — LIDOCAINE HCL 1 % IJ SOLN
3.0000 mL | INTRAMUSCULAR | Status: AC | PRN
  Administered 2020-11-01: 3 mL

## 2020-11-01 NOTE — Progress Notes (Signed)
Office Visit Note   Patient: Amber Bennett           Date of Birth: October 25, 1950           MRN: [id number] Visit Date: 11/01/2020              Requested by: Crist Infante, MD [address] [place],  [place] [address] PCP: Crist Infante, MD   Assessment & Plan: Visit Diagnoses:  1. Chronic bilateral low back pain without sciatica   2. Pain in right hip   3. Pain in left hip   4. Trochanteric bursitis, right hip     Plan: Fortunately this does not seem to be a lot of arthritis in the right hip itself.  I think that she does have a combination of just some mild arthritis but more so trochanteric bursitis of the right hip.  I did recommend a steroid injection of the right hip trochanteric area and she agreed to this and tolerated it well.  I would like to schedule her to have a fluoroscopic guided right hip intra-articular injection in the next 2 weeks to see if these combination injections can help with her symptoms.  She may end up benefiting from outpatient physical therapy but I want to see how she responds to these injections.  She does have a lot of different trips coming up as well and it may be difficult to get into therapy.  All questions and concerns were answered and addressed.  Follow-Up Instructions: Return in about 2 months (around 01/01/2021).   Orders:  Orders Placed This Encounter  Procedures  . Large Joint Inj: R greater trochanter  . XR Pelvis 1-2 Views  . XR Lumbar Spine 2-3 Views   No orders of the defined types were placed in this encounter.     Procedures: Large Joint Inj: R greater trochanter on 11/01/2020 3:05 PM Indications: pain and diagnostic evaluation Details: 22 G 1.5 in needle, lateral approach  Arthrogram: No  Medications: 3 mL lidocaine 1 %; 40 mg methylPREDNISolone acetate 40 MG/ML Outcome: tolerated well, no immediate complications Procedure, treatment alternatives, risks and benefits explained, specific risks discussed. Consent was given by the  patient. Immediately prior to procedure a time out was called to verify the correct patient, procedure, equipment, support staff and site/side marked as required. Patient was prepped and draped in the usual sterile fashion.       Clinical Data: No additional findings.   Subjective: Chief Complaint  Patient presents with  . Lower Back - Pain  . Right Hip - Pain  . Left Hip - Pain  The patient is a very pleasant 70 year old female who comes in today for evaluation treatment of mainly right hip pain.  Both her hips have been hurting and her back hurts quite a bit.  She has had extensive spine surgery over the years and at least 4 different spine surgeries.  I believe there is a fusion between L3-L5.  Her right hip is been hurting more in the groin area and on the side of the hip.  He does now definitely affecting her gait and her mobility.  She is a diabetic but has good control of her blood glucose levels.  Her son is a friend of mine who is a Consulting civil engineer in town.  She has never had surgery on her hips.  She said at this point her mobility is significant limited due to this pain.  HPI  Review of Systems She currently  denies a headache, chest pain, shortness of breath, fever, chills, nausea, vomiting  Objective: Vital Signs: Ht 4\' 10"  (1.473 m)   Wt 154 lb 9.6 oz (70.1 kg)   LMP 07/01/1980   BMI 32.31 kg/m   Physical Exam She is alert and orient x3 and in no acute distress Ortho Exam  Examination of both hips shows full and fluid range of motion with no blocks to rotation.  There is just a slight amount of pain in the groin on the right hip but not the left.  When I have her lay in the lateral position with the right hip up there is significant mount of pain over the proximal trochanteric area and the IT band.  There is some on the left side as well but is worse on the right side.  Both knees have slight valgus malalignment.  Again both hips move smoothly. Specialty  Comments:  No specialty comments available.  Imaging: XR Lumbar Spine 2-3 Views  Result Date: 11/01/2020 2 views lumbar spine show previous instrumentation from a lower lumbar fusion.  There is no complicating features of the hardware and there is no acute changes that can be seen.  XR Pelvis 1-2 Views  Result Date: 11/01/2020 An AP pelvis shows bilateral hips that have congruent and well-maintained joint spaces.    PMFS History: Patient Active Problem List   Diagnosis Date Noted  . Bertolotti's syndrome   . Convulsions/seizures ([place]) 12/01/2013  . Other convulsions 06/02/2012  . Unspecified essential hypertension 06/02/2012  . Macular degeneration ([place]) of retina, unspecified 06/02/2012  . Dermatomyositis ([place]) 06/02/2012  . Thoracic or lumbosacral neuritis or radiculitis, unspecified 06/02/2012  . Other and unspecified hyperlipidemia 06/02/2012  . Other drug allergy(995.27) 06/02/2012  . Cervical root lesions, not elsewhere classified 06/02/2012   Past Medical History:  Diagnosis Date  . Atherosclerosis of aorta ([place])   . 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 Carpal tunnel syndrome   . Cervical radiculitis   . Dermatomyositis ([place])   . Elevated liver enzymes   . External hemorrhoids   . Fatty liver   . Hematuria    negative evaluation  . High cholesterol   . HTN (hypertension)   . Internal hemorrhoids   . Lower back pain   . Macular degeneration   . Osteopenia    Left side hip  . Seizures ([place])    09/07/2019 pt reports last seizure in 2014  . Tubular adenoma of colon     Family History  Problem Relation Age of Onset  . Cancer Mother        Cancer of the bone marrow  . Heart attack Mother   . Stroke Sister   . Hypertension Sister   . Diabetes Sister   . Colon cancer Maternal Aunt     Past Surgical History:  Procedure Laterality Date  . ABDOMINAL HYSTERECTOMY  1982  . APPENDECTOMY    . BACK SURGERY    . BACK SURGERY  07/2015  . CARPAL TUNNEL RELEASE    .  CATARACT EXTRACTION, BILATERAL    . CHOLECYSTECTOMY  2003  . COLONOSCOPY    . EXPLORATORY LAPAROTOMY    . LAMINECTOMY    . SHOULDER SURGERY Right 2000  . TONSILLECTOMY     Social History   Occupational History  . Occupation: Employed    Comment: Works for [employer], [employer] and [employer]  Tobacco Use  . Smoking status: Former Smoker    Quit date: 07/01/1978  Years since quitting: 42.3  . Smokeless tobacco: Never Used  Vaping Use  . Vaping Use: Never used  Substance and Sexual Activity  . Alcohol use: Yes    Alcohol/week: 7.0 standard drinks    Types: 7 Glasses of wine per week    Comment: Consumes wine with dinner/socially  . Drug use: No  . Sexual activity: Yes    Partners: Male    Birth control/protection: Surgical    Comment: TAH

## 2020-11-15 ENCOUNTER — Ambulatory Visit: Payer: Self-pay

## 2020-11-15 ENCOUNTER — Encounter: Payer: Self-pay | Admitting: Physical Medicine and Rehabilitation

## 2020-11-15 ENCOUNTER — Ambulatory Visit (INDEPENDENT_AMBULATORY_CARE_PROVIDER_SITE_OTHER): Payer: Medicare Other | Admitting: Physical Medicine and Rehabilitation

## 2020-11-15 ENCOUNTER — Other Ambulatory Visit: Payer: Self-pay

## 2020-11-15 VITALS — BP 132/84 | HR 97

## 2020-11-15 DIAGNOSIS — M25551 Pain in right hip: Secondary | ICD-10-CM | POA: Diagnosis not present

## 2020-11-15 MED ORDER — TRIAMCINOLONE ACETONIDE 40 MG/ML IJ SUSP
60.0000 mg | INTRAMUSCULAR | Status: AC | PRN
Start: 2020-11-15 — End: 2020-11-15
  Administered 2020-11-15: 60 mg via INTRA_ARTICULAR

## 2020-11-15 MED ORDER — BUPIVACAINE HCL 0.25 % IJ SOLN
4.0000 mL | INTRAMUSCULAR | Status: AC | PRN
  Administered 2020-11-15: 4 mL via INTRA_ARTICULAR

## 2020-11-15 NOTE — Progress Notes (Signed)
   Amber Bennett - 70 y.o. female MRN [id number]  Date of birth: Jun 16, 1951  Office Visit Note: Visit Date: 11/15/2020 PCP: Crist Infante, MD Referred by: Crist Infante, MD  Subjective: No chief complaint on file.  HPI:  Amber Bennett is a 70 y.o. female who comes in today at the request of Dr. Jean Rosenthal for planned Right anesthetic hip arthrogram with fluoroscopic guidance.  The patient has failed conservative care including home exercise, medications, time and activity modification.  This injection will be diagnostic and hopefully therapeutic.  Please see requesting physician notes for further details and justification.   ROS Otherwise per HPI.  Assessment & Plan: Visit Diagnoses:    ICD-10-CM   1. Pain in right hip  M25.551 XR C-ARM NO REPORT    Plan: No additional findings.   Meds & Orders: No orders of the defined types were placed in this encounter.   Orders Placed This Encounter  Procedures  . Large Joint Inj  . XR C-ARM NO REPORT    Follow-up: No follow-ups on file.   Procedures: Large Joint Inj: R hip joint on 11/15/2020 2:09 PM Indications: diagnostic evaluation and pain Details: 22 G 3.5 in needle, fluoroscopy-guided anterior approach  Arthrogram: No  Medications: 4 mL bupivacaine 0.25 %; 60 mg triamcinolone acetonide 40 MG/ML Outcome: tolerated well, no immediate complications  There was excellent flow of contrast producing a partial arthrogram of the hip. The patient did have relief of symptoms during the anesthetic phase of the injection. Procedure, treatment alternatives, risks and benefits explained, specific risks discussed. Consent was given by the patient. Immediately prior to procedure a time out was called to verify the correct patient, procedure, equipment, support staff and site/side marked as required. Patient was prepped and draped in the usual sterile fashion.          Clinical History: No specialty comments available.      Objective:  VS:  HT:    WT:   BMI:     BP:   HR: bpm  TEMP: ( )  RESP:  Physical Exam   Imaging: No results found.

## 2020-11-15 NOTE — Progress Notes (Signed)
Pt state lower back pain that travels to her right hip and her groin area. Pt state she feels a burning pain when she walking and shopping. Pt state take over the counter pin meds to help ease her pain.   Numeric Pain Rating Scale and Functional Assessment Average Pain 5   In the last MONTH (on 0-10 scale) has pain interfered with the following?  1. General activity like being  able to carry out your everyday physical activities such as walking, climbing stairs, carrying groceries, or moving a chair?  Rating(8)   +Driver, -BT, -Dye Allergies.

## 2021-01-03 ENCOUNTER — Ambulatory Visit (INDEPENDENT_AMBULATORY_CARE_PROVIDER_SITE_OTHER): Payer: Medicare Other | Admitting: Orthopaedic Surgery

## 2021-01-03 ENCOUNTER — Encounter: Payer: Self-pay | Admitting: Orthopaedic Surgery

## 2021-01-03 DIAGNOSIS — M25551 Pain in right hip: Secondary | ICD-10-CM | POA: Diagnosis not present

## 2021-01-03 NOTE — Progress Notes (Signed)
Amber Bennett comes in today after having an intra-articular steroid injection in her right hip joint.  This was done under fluoroscopy by Dr. Ernestina Patches.  She says that is really helped quite a bit.  She is still having debilitating right-sided low back pain.  She has had at least 4 lumbar spine surgeries in the past.  Her last was in 2017.  She has not had any intervention in her spine since then.  She said she has had epidurals in the past that did help.  She has been seen by Dr. Sherley Bounds with neurosurgery here in town in the past and would like a referral back to him which I think is reasonable.  Her right hip is moving more smoothly than he did before.  There is some slight stiffness at the extremes of rotation but her pain is now minimal.  Her x-rays did not show significant arthritis in that hip.  From a hip standpoint, she can follow-up as needed.  If her pain continues we would consider an MRI of the right hip.  We will make a referral to Dr. Sherley Bounds for her for her back.  All question concerns were answered and addressed.

## 2021-01-04 ENCOUNTER — Other Ambulatory Visit: Payer: Self-pay

## 2021-01-04 DIAGNOSIS — G8929 Other chronic pain: Secondary | ICD-10-CM

## 2021-01-05 ENCOUNTER — Other Ambulatory Visit: Payer: Self-pay | Admitting: Adult Health

## 2021-01-08 NOTE — Telephone Encounter (Signed)
Per [place] registry, last filled on 10/26/2020  Phenobarbital 97.2 Mg Tablet #127/84.   Pt due for refill on or around 01/19/21. Last appt 02/02/20. Next appointment is 02/07/21. Will send Rx refill x 1 to [facility] NP to e-scribe.

## 2021-01-09 ENCOUNTER — Other Ambulatory Visit: Payer: Self-pay | Admitting: Adult Health

## 2021-01-23 DIAGNOSIS — M545 Low back pain, unspecified: Secondary | ICD-10-CM | POA: Diagnosis not present

## 2021-01-23 DIAGNOSIS — I10 Essential (primary) hypertension: Secondary | ICD-10-CM | POA: Diagnosis not present

## 2021-01-23 DIAGNOSIS — Z6833 Body mass index (BMI) 33.0-33.9, adult: Secondary | ICD-10-CM | POA: Diagnosis not present

## 2021-01-30 DIAGNOSIS — M545 Low back pain, unspecified: Secondary | ICD-10-CM | POA: Diagnosis not present

## 2021-02-07 ENCOUNTER — Ambulatory Visit: Payer: Medicare Other | Admitting: Adult Health

## 2021-02-07 ENCOUNTER — Encounter: Payer: Self-pay | Admitting: Adult Health

## 2021-02-07 ENCOUNTER — Ambulatory Visit (INDEPENDENT_AMBULATORY_CARE_PROVIDER_SITE_OTHER): Payer: Medicare Other | Admitting: Adult Health

## 2021-02-07 ENCOUNTER — Other Ambulatory Visit: Payer: Self-pay

## 2021-02-07 VITALS — BP 178/86 | HR 81 | Ht <= 58 in | Wt 153.8 lb

## 2021-02-07 DIAGNOSIS — M545 Low back pain, unspecified: Secondary | ICD-10-CM | POA: Diagnosis not present

## 2021-02-07 DIAGNOSIS — R569 Unspecified convulsions: Secondary | ICD-10-CM | POA: Diagnosis not present

## 2021-02-07 DIAGNOSIS — G8929 Other chronic pain: Secondary | ICD-10-CM

## 2021-02-07 NOTE — Progress Notes (Signed)
PATIENT: Amber Bennett DOB: [birth year]  REASON FOR VISIT: follow up HISTORY FROM: patient Primary neurologist: Dr. Jannifer Franklin  HISTORY OF PRESENT ILLNESS: Today 02/07/21: Amber Bennett is a 70 year old female with a history of seizures. She returns today for follow-up. She remains on topmax and phenobarbital. Denies any siezure events. Operates a motor vehicle without difficulty. Completes all ADLS independently. Continues on gabapentin and Cymbalta for back pain. Returns today for follow-up.   02/02/20: Amber Bennett is a 70 year old female with a history of seizures.  She returns today for follow-up.  She remains on Topamax and phenobarbital.  Denies any seizure events.  No changes with her gait or balance.  She also has a history of low back pain.  She continues to take gabapentin and Cymbalta.  Reports that this continues to work well for her.  She returns today for an evaluation.  HISTORY Amber Bennett is a 70 year old right-handed white female with history of seizures that have been well controlled on Topamax and phenobarbital.  The patient has not had any seizures since last seen.  She does have a history of low back pain that has worsened slightly as she is packing boxes to move.  She takes gabapentin as well for her low back and is on Cymbalta.  She returns to this office for an evaluation.  On an annual basis, her primary care physician checks the phenobarbital level.  The last level 1 year ago was 23  REVIEW OF SYSTEMS: Out of a complete 14 system review of symptoms, the patient complains only of the following symptoms, and all other reviewed systems are negative.  See HPI  ALLERGIES: Allergies  Allergen Reactions   Tylenol [Acetaminophen] Swelling and Other (See Comments)    Throat swelling, anaphylaxis   Latex Itching    Rash     HOME MEDICATIONS: Outpatient Medications Prior to Visit  Medication Sig Dispense Refill   amLODipine (NORVASC) 2.5 MG tablet Take 2.5 mg by mouth daily.      BIOTIN PO Take by mouth.     Calcium Carbonate-Vitamin D (CALCIUM + D PO) Take by mouth daily.     Coenzyme Q10 (CO Q 10 PO) Take 100 mg by mouth daily.     Cyanocobalamin (VITAMIN B 12 PO) Take 1,000 mg by mouth daily.     diphenoxylate-atropine (LOMOTIL) 2.5-0.025 MG tablet Take 1 tablet by mouth 4 (four) times daily as needed for diarrhea or loose stools. 120 tablet 2   DULoxetine (CYMBALTA) 30 MG capsule TK 1 C PO QPM  5   ezetimibe (ZETIA) 10 MG tablet Take 10 mg by mouth daily.     fluticasone (FLONASE) 50 MCG/ACT nasal spray Place 2 sprays into both nostrils daily.      gabapentin (NEURONTIN) 800 MG tablet TAKE 1 TABLET(800 MG) BY MOUTH TWICE DAILY 180 tablet 1   loratadine (CLARITIN) 10 MG tablet Take 10 mg by mouth daily.     montelukast (SINGULAIR) 10 MG tablet Take 10 mg by mouth daily.     Multiple Vitamins-Minerals (MULTIVITAMIN PO) Take by mouth daily.     Multiple Vitamins-Minerals (PRESERVISION AREDS PO) Take by mouth.     omega-3 acid ethyl esters (LOVAZA) 1 G capsule Take 1 capsule by mouth 4 (four) times daily.      omeprazole (PRILOSEC) 20 MG capsule TK 1 T PO QD  3   OZEMPIC 1 MG/DOSE SOPN INJECT 1 MG INTO THE SKIN ONCE A WEEK  6  PHENobarbital (LUMINAL) 97.2 MG tablet TAKE 1 AND 1/2 TABLETS BY MOUTH EVERY DAY 135 tablet 0   potassium chloride (KLOR-CON) 20 MEQ packet Take by mouth 2 (two) times daily.     Probiotic Product (PROBIOTIC PO) Take 1 Dose by mouth daily.     telmisartan (MICARDIS) 40 MG tablet Take 40 mg by mouth daily.      topiramate (TOPAMAX) 50 MG tablet TAKE 1 TABLET BY MOUTH EVERY MORNING AND 2 TABLET BY MOUTH EVERY EVENING. 270 tablet 3   vitamin E 400 UNIT capsule Take 400 Units by mouth daily.     No facility-administered medications prior to visit.    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   Carpal tunnel syndrome    Cervical radiculitis     Dermatomyositis (HCC)    Elevated liver enzymes    External hemorrhoids    Fatty liver    Hematuria    negative evaluation   High cholesterol    HTN (hypertension)    Internal hemorrhoids    Lower back pain    Macular degeneration    Osteopenia    Left side hip   Seizures ([place])    09/07/2019 pt reports last seizure in 2014   Tubular adenoma of colon     PAST SURGICAL HISTORY: Past Surgical History:  Procedure Laterality Date   ABDOMINAL HYSTERECTOMY  [place] SURGERY  07/2015   CARPAL TUNNEL RELEASE     CATARACT EXTRACTION, BILATERAL     CHOLECYSTECTOMY  2003   COLONOSCOPY     EXPLORATORY LAPAROTOMY     LAMINECTOMY     SHOULDER SURGERY Right 2000   TONSILLECTOMY      FAMILY HISTORY: Family History  Problem Relation Age of Onset   Cancer Mother        Cancer of the bone marrow   Heart attack Mother    Stroke Sister    Hypertension Sister    Diabetes Sister    Colon cancer Maternal Aunt     SOCIAL HISTORY: Social History   Socioeconomic History   Marital status: Married    Spouse name: Not on file   Number of children: 2   Years of education: 14   Highest education level: Not on file  Occupational History   Occupation: Employed    Comment: Works for [employer], [employer] and [employer]  Tobacco Use   Smoking status: Former    Types: Cigarettes    Quit date: 07/01/1978    Years since quitting: 42.6   Smokeless tobacco: Never  Vaping Use   Vaping Use: Never used  Substance and Sexual Activity   Alcohol use: Yes    Alcohol/week: 7.0 standard drinks    Types: 7 Glasses of wine per week    Comment: Consumes wine with dinner/socially   Drug use: No   Sexual activity: Yes    Partners: Male    Birth control/protection: Surgical    Comment: TAH  Other Topics Concern   Not on file  Social History Narrative   Patient lives at home with her husband Zada Finders).   Retired.   Education two years of business college.   Right  handed.   Caffeine     Social Determinants of Health   Financial Resource Strain: Not on file  Food Insecurity: Not on file  Transportation Needs: Not on file  Physical Activity: Not on file  Stress: Not on file  Social Connections: Not on file  Intimate Partner Violence: Not on file      PHYSICAL EXAM  Vitals:   02/07/21 1536  BP: (!) 178/86  Pulse: 81  Weight: 153 lb 12.8 oz (69.8 kg)  Height: '4\' 10"'$  (1.473 m)   Body mass index is 32.14 kg/m.  Generalized: Well developed, in no acute distress   Neurological examination  Mentation: Alert oriented to time, place, history taking. Follows all commands speech and language fluent Cranial nerve II-XII: Pupils were equal round reactive to light. Extraocular movements were full, visual field were full on confrontational test. Head turning and shoulder shrug  were normal and symmetric. Motor: The motor testing reveals 5 over 5 strength of all 4 extremities. Good symmetric motor tone is noted throughout.  Sensory: Sensory testing is intact to soft touch on all 4 extremities. No evidence of extinction is noted.  Coordination: Cerebellar testing reveals good finger-nose-finger and heel-to-shin bilaterally.  Gait and station: Gait is normal.  Reflexes: Deep tendon reflexes are symmetric and normal bilaterally.   DIAGNOSTIC DATA (LABS, IMAGING, TESTING) - I reviewed patient records, labs, notes, testing and imaging myself where available.     ASSESSMENT AND PLAN 70 y.o. year old female  has a past medical history of Atherosclerosis of aorta ([place]), Bertolotti's syndrome, Carpal tunnel syndrome, Cervical radiculitis, Dermatomyositis ([place]), Elevated liver enzymes, External hemorrhoids, Fatty liver, Hematuria, High cholesterol, HTN (hypertension), Internal hemorrhoids, Lower back pain, Macular degeneration, Osteopenia, Seizures ([place]), and Tubular adenoma of colon. here with:  Seizures  Continue phenobarbital 97.2 mg 1 1/2 tablets  daily  Continue Topamax 50 mg in the AM, 100 mg in the PM Levels are checked by her PCP  Low back pain  Continue Cymbalta 30 mg daily  Continue  gabapentin 800 mg BID   ** patient request to make Dr. Brett Fairy her primary neurologist once Dr. Jamey Ripa  Advised if symptoms worsen or she develops new symptoms she should let us know follow-up in 1 year or sooner if needed   Ward Givens, MSN, NP-C 02/07/2021, 3:43 PM [facility] [address] [place], [place] [address] [phone]

## 2021-02-07 NOTE — Patient Instructions (Addendum)
Your Plan:  Continue Phenobarbital and Topamax Continue gabapentin and Cymbalta  If your symptoms worsen or you develop new symptoms please let us know.  0 Thank you for coming to see [place] at [facility]. I hope we have been able to provide you high quality care today.  You may receive a patient satisfaction survey over the next few weeks. We would appreciate your feedback and comments so that we may continue to improve ourselves and the health of our patients.

## 2021-02-21 DIAGNOSIS — Z20822 Contact with and (suspected) exposure to covid-19: Secondary | ICD-10-CM | POA: Diagnosis not present

## 2021-03-01 DIAGNOSIS — Z6832 Body mass index (BMI) 32.0-32.9, adult: Secondary | ICD-10-CM | POA: Diagnosis not present

## 2021-03-01 DIAGNOSIS — M545 Low back pain, unspecified: Secondary | ICD-10-CM | POA: Diagnosis not present

## 2021-03-01 DIAGNOSIS — I10 Essential (primary) hypertension: Secondary | ICD-10-CM | POA: Diagnosis not present

## 2021-03-02 ENCOUNTER — Other Ambulatory Visit: Payer: Self-pay | Admitting: Neurological Surgery

## 2021-03-02 DIAGNOSIS — M545 Low back pain, unspecified: Secondary | ICD-10-CM

## 2021-03-06 DIAGNOSIS — I440 Atrioventricular block, first degree: Secondary | ICD-10-CM | POA: Diagnosis not present

## 2021-03-06 DIAGNOSIS — R569 Unspecified convulsions: Secondary | ICD-10-CM | POA: Diagnosis not present

## 2021-03-06 DIAGNOSIS — M3390 Dermatopolymyositis, unspecified, organ involvement unspecified: Secondary | ICD-10-CM | POA: Diagnosis not present

## 2021-03-06 DIAGNOSIS — R7301 Impaired fasting glucose: Secondary | ICD-10-CM | POA: Diagnosis not present

## 2021-03-06 DIAGNOSIS — E785 Hyperlipidemia, unspecified: Secondary | ICD-10-CM | POA: Diagnosis not present

## 2021-03-06 DIAGNOSIS — K219 Gastro-esophageal reflux disease without esophagitis: Secondary | ICD-10-CM | POA: Diagnosis not present

## 2021-03-06 DIAGNOSIS — I10 Essential (primary) hypertension: Secondary | ICD-10-CM | POA: Diagnosis not present

## 2021-03-06 DIAGNOSIS — E876 Hypokalemia: Secondary | ICD-10-CM | POA: Diagnosis not present

## 2021-03-06 DIAGNOSIS — K760 Fatty (change of) liver, not elsewhere classified: Secondary | ICD-10-CM | POA: Diagnosis not present

## 2021-04-02 ENCOUNTER — Ambulatory Visit
Admission: RE | Admit: 2021-04-02 | Discharge: 2021-04-02 | Disposition: A | Discharge status: Home / Self Care | Payer: Medicare Other | Source: Ambulatory Visit | Attending: Neurological Surgery | Admitting: Neurological Surgery

## 2021-04-02 DIAGNOSIS — M545 Low back pain, unspecified: Secondary | ICD-10-CM

## 2021-04-05 DIAGNOSIS — M533 Sacrococcygeal disorders, not elsewhere classified: Secondary | ICD-10-CM | POA: Diagnosis not present

## 2021-04-05 DIAGNOSIS — Z6832 Body mass index (BMI) 32.0-32.9, adult: Secondary | ICD-10-CM | POA: Diagnosis not present

## 2021-04-05 DIAGNOSIS — I10 Essential (primary) hypertension: Secondary | ICD-10-CM | POA: Diagnosis not present

## 2021-04-06 ENCOUNTER — Other Ambulatory Visit: Payer: Self-pay | Admitting: Neurological Surgery

## 2021-04-06 DIAGNOSIS — M533 Sacrococcygeal disorders, not elsewhere classified: Secondary | ICD-10-CM

## 2021-04-07 DIAGNOSIS — Z23 Encounter for immunization: Secondary | ICD-10-CM | POA: Diagnosis not present

## 2021-04-09 ENCOUNTER — Other Ambulatory Visit: Payer: Self-pay | Admitting: Adult Health

## 2021-04-10 ENCOUNTER — Telehealth: Payer: Self-pay | Admitting: Adult Health

## 2021-04-10 NOTE — Telephone Encounter (Signed)
Pt called needing a refill request for her gabapentin (NEURONTIN) 800 MG tablet sent in to the [facility] on [address]

## 2021-04-10 NOTE — Telephone Encounter (Signed)
Refill sent.

## 2021-04-16 ENCOUNTER — Other Ambulatory Visit: Payer: Self-pay | Admitting: Adult Health

## 2021-04-16 MED ORDER — PHENOBARBITAL 97.2 MG PO TABS: ORAL_TABLET | ORAL | 1 refills | Status: DC

## 2021-04-16 NOTE — Telephone Encounter (Signed)
Pt called requesting refill for PHENobarbital (LUMINAL) 97.2 MG tablet. Pt says she needs a 90 day supply. [place], [address] AT [address].

## 2021-05-10 DIAGNOSIS — Z23 Encounter for immunization: Secondary | ICD-10-CM | POA: Diagnosis not present

## 2021-05-10 DIAGNOSIS — Z20822 Contact with and (suspected) exposure to covid-19: Secondary | ICD-10-CM | POA: Diagnosis not present

## 2021-05-18 ENCOUNTER — Ambulatory Visit
Admission: RE | Admit: 2021-05-18 | Discharge: 2021-05-18 | Disposition: A | Discharge status: Home / Self Care | Payer: Medicare Other | Source: Ambulatory Visit | Attending: Neurological Surgery | Admitting: Neurological Surgery

## 2021-05-18 DIAGNOSIS — M533 Sacrococcygeal disorders, not elsewhere classified: Secondary | ICD-10-CM | POA: Diagnosis not present

## 2021-05-18 MED ORDER — METHYLPREDNISOLONE ACETATE 40 MG/ML INJ SUSP (RADIOLOG: 80.0000 mg | Freq: Once | INTRAMUSCULAR | Status: DC

## 2021-06-07 DIAGNOSIS — M545 Low back pain, unspecified: Secondary | ICD-10-CM | POA: Diagnosis not present

## 2021-06-19 DIAGNOSIS — M625A9 Muscle wasting and atrophy, not elsewhere classified, back, unspecified level: Secondary | ICD-10-CM | POA: Diagnosis not present

## 2021-06-19 DIAGNOSIS — M545 Low back pain, unspecified: Secondary | ICD-10-CM | POA: Diagnosis not present

## 2021-06-19 DIAGNOSIS — M2569 Stiffness of other specified joint, not elsewhere classified: Secondary | ICD-10-CM | POA: Diagnosis not present

## 2021-06-19 DIAGNOSIS — R293 Abnormal posture: Secondary | ICD-10-CM | POA: Diagnosis not present

## 2021-07-10 ENCOUNTER — Telehealth: Payer: Self-pay | Admitting: Adult Health

## 2021-07-10 NOTE — Telephone Encounter (Signed)
I called pharmacy [facility] [address].  Has refill available for gabapentin and phenobarbital.  I see that you have refill left.  I called pharmacy and was on hold for 30 min, and still holding had to hang up.

## 2021-07-10 NOTE — Telephone Encounter (Signed)
Pt called requesting refill for gabapentin (NEURONTIN) 800 MG tablet. Pt also asking of we could go ahead and call in the PHENobarbital (LUMINAL) 97.2 MG tablet as well. Pharmacy [place], [address] AT [address]

## 2021-07-12 DIAGNOSIS — M545 Low back pain, unspecified: Secondary | ICD-10-CM | POA: Diagnosis not present

## 2021-07-12 DIAGNOSIS — M2569 Stiffness of other specified joint, not elsewhere classified: Secondary | ICD-10-CM | POA: Diagnosis not present

## 2021-07-12 DIAGNOSIS — M625A9 Muscle wasting and atrophy, not elsewhere classified, back, unspecified level: Secondary | ICD-10-CM | POA: Diagnosis not present

## 2021-07-12 DIAGNOSIS — R293 Abnormal posture: Secondary | ICD-10-CM | POA: Diagnosis not present

## 2021-07-16 DIAGNOSIS — M625A9 Muscle wasting and atrophy, not elsewhere classified, back, unspecified level: Secondary | ICD-10-CM | POA: Diagnosis not present

## 2021-07-16 DIAGNOSIS — R293 Abnormal posture: Secondary | ICD-10-CM | POA: Diagnosis not present

## 2021-07-16 DIAGNOSIS — M2569 Stiffness of other specified joint, not elsewhere classified: Secondary | ICD-10-CM | POA: Diagnosis not present

## 2021-07-16 DIAGNOSIS — M545 Low back pain, unspecified: Secondary | ICD-10-CM | POA: Diagnosis not present

## 2021-07-17 DIAGNOSIS — M625A9 Muscle wasting and atrophy, not elsewhere classified, back, unspecified level: Secondary | ICD-10-CM | POA: Diagnosis not present

## 2021-07-17 DIAGNOSIS — R293 Abnormal posture: Secondary | ICD-10-CM | POA: Diagnosis not present

## 2021-07-17 DIAGNOSIS — M545 Low back pain, unspecified: Secondary | ICD-10-CM | POA: Diagnosis not present

## 2021-07-17 DIAGNOSIS — M2569 Stiffness of other specified joint, not elsewhere classified: Secondary | ICD-10-CM | POA: Diagnosis not present

## 2021-07-23 DIAGNOSIS — R293 Abnormal posture: Secondary | ICD-10-CM | POA: Diagnosis not present

## 2021-07-23 DIAGNOSIS — M545 Low back pain, unspecified: Secondary | ICD-10-CM | POA: Diagnosis not present

## 2021-07-23 DIAGNOSIS — M625A9 Muscle wasting and atrophy, not elsewhere classified, back, unspecified level: Secondary | ICD-10-CM | POA: Diagnosis not present

## 2021-07-23 DIAGNOSIS — M2569 Stiffness of other specified joint, not elsewhere classified: Secondary | ICD-10-CM | POA: Diagnosis not present

## 2021-07-25 NOTE — Telephone Encounter (Signed)
Spoke to pt and she did get her phenobarb and gabapentin refills.  She appreciated call back.

## 2021-07-26 DIAGNOSIS — M545 Low back pain, unspecified: Secondary | ICD-10-CM | POA: Diagnosis not present

## 2021-07-26 DIAGNOSIS — M2569 Stiffness of other specified joint, not elsewhere classified: Secondary | ICD-10-CM | POA: Diagnosis not present

## 2021-07-26 DIAGNOSIS — M625A9 Muscle wasting and atrophy, not elsewhere classified, back, unspecified level: Secondary | ICD-10-CM | POA: Diagnosis not present

## 2021-07-26 DIAGNOSIS — R293 Abnormal posture: Secondary | ICD-10-CM | POA: Diagnosis not present

## 2021-07-31 DIAGNOSIS — M545 Low back pain, unspecified: Secondary | ICD-10-CM | POA: Diagnosis not present

## 2021-07-31 DIAGNOSIS — R293 Abnormal posture: Secondary | ICD-10-CM | POA: Diagnosis not present

## 2021-07-31 DIAGNOSIS — M625A9 Muscle wasting and atrophy, not elsewhere classified, back, unspecified level: Secondary | ICD-10-CM | POA: Diagnosis not present

## 2021-07-31 DIAGNOSIS — M2569 Stiffness of other specified joint, not elsewhere classified: Secondary | ICD-10-CM | POA: Diagnosis not present

## 2021-08-02 DIAGNOSIS — M2569 Stiffness of other specified joint, not elsewhere classified: Secondary | ICD-10-CM | POA: Diagnosis not present

## 2021-08-02 DIAGNOSIS — R293 Abnormal posture: Secondary | ICD-10-CM | POA: Diagnosis not present

## 2021-08-02 DIAGNOSIS — M625A9 Muscle wasting and atrophy, not elsewhere classified, back, unspecified level: Secondary | ICD-10-CM | POA: Diagnosis not present

## 2021-08-02 DIAGNOSIS — M545 Low back pain, unspecified: Secondary | ICD-10-CM | POA: Diagnosis not present

## 2021-08-06 DIAGNOSIS — E119 Type 2 diabetes mellitus without complications: Secondary | ICD-10-CM | POA: Diagnosis not present

## 2021-08-06 DIAGNOSIS — H353122 Nonexudative age-related macular degeneration, left eye, intermediate dry stage: Secondary | ICD-10-CM | POA: Diagnosis not present

## 2021-08-06 DIAGNOSIS — H52203 Unspecified astigmatism, bilateral: Secondary | ICD-10-CM | POA: Diagnosis not present

## 2021-08-06 DIAGNOSIS — H26493 Other secondary cataract, bilateral: Secondary | ICD-10-CM | POA: Diagnosis not present

## 2021-08-07 DIAGNOSIS — R293 Abnormal posture: Secondary | ICD-10-CM | POA: Diagnosis not present

## 2021-08-07 DIAGNOSIS — M625A9 Muscle wasting and atrophy, not elsewhere classified, back, unspecified level: Secondary | ICD-10-CM | POA: Diagnosis not present

## 2021-08-07 DIAGNOSIS — M545 Low back pain, unspecified: Secondary | ICD-10-CM | POA: Diagnosis not present

## 2021-08-07 DIAGNOSIS — M2569 Stiffness of other specified joint, not elsewhere classified: Secondary | ICD-10-CM | POA: Diagnosis not present

## 2021-08-09 DIAGNOSIS — M2569 Stiffness of other specified joint, not elsewhere classified: Secondary | ICD-10-CM | POA: Diagnosis not present

## 2021-08-09 DIAGNOSIS — M625A9 Muscle wasting and atrophy, not elsewhere classified, back, unspecified level: Secondary | ICD-10-CM | POA: Diagnosis not present

## 2021-08-09 DIAGNOSIS — M533 Sacrococcygeal disorders, not elsewhere classified: Secondary | ICD-10-CM | POA: Diagnosis not present

## 2021-08-09 DIAGNOSIS — I10 Essential (primary) hypertension: Secondary | ICD-10-CM | POA: Diagnosis not present

## 2021-08-09 DIAGNOSIS — R293 Abnormal posture: Secondary | ICD-10-CM | POA: Diagnosis not present

## 2021-08-09 DIAGNOSIS — Z6833 Body mass index (BMI) 33.0-33.9, adult: Secondary | ICD-10-CM | POA: Diagnosis not present

## 2021-08-09 DIAGNOSIS — M545 Low back pain, unspecified: Secondary | ICD-10-CM | POA: Diagnosis not present

## 2021-08-10 ENCOUNTER — Other Ambulatory Visit: Payer: Self-pay | Admitting: Neurological Surgery

## 2021-08-10 DIAGNOSIS — M533 Sacrococcygeal disorders, not elsewhere classified: Secondary | ICD-10-CM

## 2021-08-13 DIAGNOSIS — M2569 Stiffness of other specified joint, not elsewhere classified: Secondary | ICD-10-CM | POA: Diagnosis not present

## 2021-08-13 DIAGNOSIS — M625A9 Muscle wasting and atrophy, not elsewhere classified, back, unspecified level: Secondary | ICD-10-CM | POA: Diagnosis not present

## 2021-08-13 DIAGNOSIS — M545 Low back pain, unspecified: Secondary | ICD-10-CM | POA: Diagnosis not present

## 2021-08-13 DIAGNOSIS — R293 Abnormal posture: Secondary | ICD-10-CM | POA: Diagnosis not present

## 2021-08-20 ENCOUNTER — Other Ambulatory Visit: Payer: Self-pay | Admitting: Adult Health

## 2021-08-20 DIAGNOSIS — M545 Low back pain, unspecified: Secondary | ICD-10-CM | POA: Diagnosis not present

## 2021-08-20 DIAGNOSIS — R293 Abnormal posture: Secondary | ICD-10-CM | POA: Diagnosis not present

## 2021-08-20 DIAGNOSIS — M2569 Stiffness of other specified joint, not elsewhere classified: Secondary | ICD-10-CM | POA: Diagnosis not present

## 2021-08-20 DIAGNOSIS — M625A9 Muscle wasting and atrophy, not elsewhere classified, back, unspecified level: Secondary | ICD-10-CM | POA: Diagnosis not present

## 2021-08-21 NOTE — Telephone Encounter (Signed)
Per [place] registry, last filled on 07/23/2021 Phenobarbital 97.2 Mg Tablet #127.00/84 days.   Last visit 02/07/2021  Next visit 02/13/2022   Rx refill sent to [facility].

## 2021-08-28 ENCOUNTER — Other Ambulatory Visit: Payer: Self-pay

## 2021-08-28 ENCOUNTER — Ambulatory Visit
Admission: RE | Admit: 2021-08-28 | Discharge: 2021-08-28 | Disposition: A | Discharge status: Home / Self Care | Payer: Medicare Other | Source: Ambulatory Visit | Attending: Neurological Surgery | Admitting: Neurological Surgery

## 2021-08-28 DIAGNOSIS — M533 Sacrococcygeal disorders, not elsewhere classified: Secondary | ICD-10-CM

## 2021-09-06 DIAGNOSIS — I10 Essential (primary) hypertension: Secondary | ICD-10-CM | POA: Diagnosis not present

## 2021-09-06 DIAGNOSIS — Z6834 Body mass index (BMI) 34.0-34.9, adult: Secondary | ICD-10-CM | POA: Diagnosis not present

## 2021-09-06 DIAGNOSIS — M533 Sacrococcygeal disorders, not elsewhere classified: Secondary | ICD-10-CM | POA: Diagnosis not present

## 2021-09-19 DIAGNOSIS — M533 Sacrococcygeal disorders, not elsewhere classified: Secondary | ICD-10-CM | POA: Diagnosis not present

## 2021-09-19 DIAGNOSIS — Z6834 Body mass index (BMI) 34.0-34.9, adult: Secondary | ICD-10-CM | POA: Diagnosis not present

## 2021-09-19 DIAGNOSIS — I10 Essential (primary) hypertension: Secondary | ICD-10-CM | POA: Diagnosis not present

## 2021-09-20 ENCOUNTER — Other Ambulatory Visit: Payer: Self-pay | Admitting: Neurological Surgery

## 2021-09-20 DIAGNOSIS — M533 Sacrococcygeal disorders, not elsewhere classified: Secondary | ICD-10-CM

## 2021-09-21 DIAGNOSIS — E785 Hyperlipidemia, unspecified: Secondary | ICD-10-CM | POA: Diagnosis not present

## 2021-09-21 DIAGNOSIS — R569 Unspecified convulsions: Secondary | ICD-10-CM | POA: Diagnosis not present

## 2021-09-21 DIAGNOSIS — M8580 Other specified disorders of bone density and structure, unspecified site: Secondary | ICD-10-CM | POA: Diagnosis not present

## 2021-09-21 DIAGNOSIS — I10 Essential (primary) hypertension: Secondary | ICD-10-CM | POA: Diagnosis not present

## 2021-09-21 DIAGNOSIS — R7301 Impaired fasting glucose: Secondary | ICD-10-CM | POA: Diagnosis not present

## 2021-09-24 DIAGNOSIS — Z1212 Encounter for screening for malignant neoplasm of rectum: Secondary | ICD-10-CM | POA: Diagnosis not present

## 2021-09-25 ENCOUNTER — Other Ambulatory Visit: Payer: Medicare Other

## 2021-09-28 DIAGNOSIS — R9431 Abnormal electrocardiogram [ECG] [EKG]: Secondary | ICD-10-CM | POA: Diagnosis not present

## 2021-09-28 DIAGNOSIS — R82998 Other abnormal findings in urine: Secondary | ICD-10-CM | POA: Diagnosis not present

## 2021-09-28 DIAGNOSIS — Z0000 Encounter for general adult medical examination without abnormal findings: Secondary | ICD-10-CM | POA: Diagnosis not present

## 2021-09-28 DIAGNOSIS — Z23 Encounter for immunization: Secondary | ICD-10-CM | POA: Diagnosis not present

## 2021-09-28 DIAGNOSIS — I700 Atherosclerosis of aorta: Secondary | ICD-10-CM | POA: Diagnosis not present

## 2021-09-28 DIAGNOSIS — Z1331 Encounter for screening for depression: Secondary | ICD-10-CM | POA: Diagnosis not present

## 2021-09-28 DIAGNOSIS — R350 Frequency of micturition: Secondary | ICD-10-CM | POA: Diagnosis not present

## 2021-09-28 DIAGNOSIS — E785 Hyperlipidemia, unspecified: Secondary | ICD-10-CM | POA: Diagnosis not present

## 2021-09-28 DIAGNOSIS — R945 Abnormal results of liver function studies: Secondary | ICD-10-CM | POA: Diagnosis not present

## 2021-09-28 DIAGNOSIS — R809 Proteinuria, unspecified: Secondary | ICD-10-CM | POA: Diagnosis not present

## 2021-09-28 DIAGNOSIS — K760 Fatty (change of) liver, not elsewhere classified: Secondary | ICD-10-CM | POA: Diagnosis not present

## 2021-09-28 DIAGNOSIS — Z1389 Encounter for screening for other disorder: Secondary | ICD-10-CM | POA: Diagnosis not present

## 2021-09-28 DIAGNOSIS — R569 Unspecified convulsions: Secondary | ICD-10-CM | POA: Diagnosis not present

## 2021-09-28 DIAGNOSIS — R7301 Impaired fasting glucose: Secondary | ICD-10-CM | POA: Diagnosis not present

## 2021-09-28 DIAGNOSIS — I10 Essential (primary) hypertension: Secondary | ICD-10-CM | POA: Diagnosis not present

## 2021-09-28 DIAGNOSIS — M8580 Other specified disorders of bone density and structure, unspecified site: Secondary | ICD-10-CM | POA: Diagnosis not present

## 2021-10-08 ENCOUNTER — Ambulatory Visit
Admission: RE | Admit: 2021-10-08 | Discharge: 2021-10-08 | Disposition: A | Discharge status: Home / Self Care | Payer: Medicare Other | Source: Ambulatory Visit | Attending: Neurological Surgery | Admitting: Neurological Surgery

## 2021-10-08 DIAGNOSIS — M533 Sacrococcygeal disorders, not elsewhere classified: Secondary | ICD-10-CM

## 2021-10-08 MED ORDER — METHYLPREDNISOLONE ACETATE 80 MG/ML IJ SUSP
80.0000 mg | Freq: Once | INTRAMUSCULAR | Status: AC
  Administered 2021-10-08: 80 mg via INTRA_ARTICULAR

## 2021-10-09 ENCOUNTER — Other Ambulatory Visit: Payer: Self-pay | Admitting: Adult Health

## 2021-10-14 DIAGNOSIS — Z20822 Contact with and (suspected) exposure to covid-19: Secondary | ICD-10-CM | POA: Diagnosis not present

## 2021-10-24 DIAGNOSIS — Z6834 Body mass index (BMI) 34.0-34.9, adult: Secondary | ICD-10-CM | POA: Diagnosis not present

## 2021-10-24 DIAGNOSIS — M533 Sacrococcygeal disorders, not elsewhere classified: Secondary | ICD-10-CM | POA: Diagnosis not present

## 2021-10-24 DIAGNOSIS — M7061 Trochanteric bursitis, right hip: Secondary | ICD-10-CM | POA: Diagnosis not present

## 2021-10-25 ENCOUNTER — Other Ambulatory Visit: Payer: Self-pay | Admitting: Neurological Surgery

## 2021-10-25 DIAGNOSIS — M533 Sacrococcygeal disorders, not elsewhere classified: Secondary | ICD-10-CM

## 2021-10-25 DIAGNOSIS — M7061 Trochanteric bursitis, right hip: Secondary | ICD-10-CM

## 2021-10-29 DIAGNOSIS — Z20822 Contact with and (suspected) exposure to covid-19: Secondary | ICD-10-CM | POA: Diagnosis not present

## 2021-11-02 ENCOUNTER — Ambulatory Visit
Admission: RE | Admit: 2021-11-02 | Discharge: 2021-11-02 | Disposition: A | Discharge status: Home / Self Care | Payer: Medicare Other | Source: Ambulatory Visit | Attending: Neurological Surgery | Admitting: Neurological Surgery

## 2021-11-02 DIAGNOSIS — R600 Localized edema: Secondary | ICD-10-CM | POA: Diagnosis not present

## 2021-11-02 DIAGNOSIS — M7061 Trochanteric bursitis, right hip: Secondary | ICD-10-CM

## 2021-11-02 DIAGNOSIS — Z981 Arthrodesis status: Secondary | ICD-10-CM | POA: Diagnosis not present

## 2021-11-02 DIAGNOSIS — Z90710 Acquired absence of both cervix and uterus: Secondary | ICD-10-CM | POA: Diagnosis not present

## 2021-11-02 DIAGNOSIS — M1611 Unilateral primary osteoarthritis, right hip: Secondary | ICD-10-CM | POA: Diagnosis not present

## 2021-11-09 DIAGNOSIS — M533 Sacrococcygeal disorders, not elsewhere classified: Secondary | ICD-10-CM | POA: Diagnosis not present

## 2021-11-09 DIAGNOSIS — M1611 Unilateral primary osteoarthritis, right hip: Secondary | ICD-10-CM | POA: Diagnosis not present

## 2021-11-21 ENCOUNTER — Ambulatory Visit (INDEPENDENT_AMBULATORY_CARE_PROVIDER_SITE_OTHER): Payer: Medicare Other

## 2021-11-21 ENCOUNTER — Other Ambulatory Visit: Payer: Self-pay

## 2021-11-21 ENCOUNTER — Encounter: Payer: Self-pay | Admitting: Orthopaedic Surgery

## 2021-11-21 ENCOUNTER — Ambulatory Visit (INDEPENDENT_AMBULATORY_CARE_PROVIDER_SITE_OTHER): Payer: Medicare Other | Admitting: Orthopaedic Surgery

## 2021-11-21 ENCOUNTER — Other Ambulatory Visit: Payer: Medicare Other

## 2021-11-21 VITALS — Ht <= 58 in | Wt 164.0 lb

## 2021-11-21 DIAGNOSIS — M25551 Pain in right hip: Secondary | ICD-10-CM

## 2021-11-21 DIAGNOSIS — M1611 Unilateral primary osteoarthritis, right hip: Secondary | ICD-10-CM | POA: Diagnosis not present

## 2021-11-21 NOTE — Progress Notes (Signed)
The patient is very well-known to me.  She is 71 year old female and reportedly has some chronic pain as it relates to her low back but also her right hip.  She has had several back surgeries in the past and has a spinal fusion of the lower lumbar spine.  She has been seen by the neurosurgeons in the closely assessed her SI joints to see if it was an SI joint issue.  She has had 2 injections in her SI joint and this did not help her at all.  Just over a year ago Dr. Ernestina Patches did provide a steroid injection in her right hip joint and that helped significantly.  A recent MRI of her right hip shows moderate degenerative changes and a degenerative labral tear.  She says her pain is present all the time whether with rest or without.  It is worse obviously with weightbearing activities.  Most of her pain seems to be in the low back region but it does radiate into the groin.  On exam today she does not walk with a significant limp.  Both her hips move smoothly and fluidly with no significant pain in the groin on my exam today.  I did go over plain films today of her pelvis and right hip and the plain films show a well-maintained hip joint.  I did look at the MRI of her right hip.  There is no effusion.  There is no significant joint space narrowing and mild to moderate arthritic changes in the hip femoral head and degenerative labral tear.  Again, she does not manifest this on her clinical exam today.  I would like to send her 1 more time to Dr. Ernestina Patches since it has been a year.  I would like him to try 1 more intra-articular steroid injection in her right hip joint under fluoroscopy.  I would then like to see her back in 2 to 3 weeks after that injection to come up with a final treatment plan of whether or not we feel that she would benefit from a hip replacement on the right side.

## 2021-11-27 ENCOUNTER — Ambulatory Visit: Payer: Self-pay

## 2021-11-27 ENCOUNTER — Encounter: Payer: Self-pay | Admitting: Physical Medicine and Rehabilitation

## 2021-11-27 ENCOUNTER — Ambulatory Visit (INDEPENDENT_AMBULATORY_CARE_PROVIDER_SITE_OTHER): Payer: Medicare Other | Admitting: Physical Medicine and Rehabilitation

## 2021-11-27 DIAGNOSIS — M25551 Pain in right hip: Secondary | ICD-10-CM | POA: Diagnosis not present

## 2021-11-27 NOTE — Progress Notes (Unsigned)
   JARIELYS GIRARDOT - 71 y.o. female MRN [id number]  Date of birth: [birth year]  Office Visit Note: Visit Date: 11/27/2021 PCP: Crist Infante, MD Referred by: Crist Infante, MD  Subjective: Chief Complaint  Patient presents with   Right Hip - Pain   HPI:  MARCA GADSBY is a 71 y.o. female who comes in todayHPI ROS Otherwise per HPI.  Assessment & Plan: Visit Diagnoses:    ICD-10-CM   1. Pain in right hip  M25.551 XR C-ARM NO REPORT      Plan: No additional findings.   Meds & Orders: No orders of the defined types were placed in this encounter.   Orders Placed This Encounter  Procedures   Large Joint Inj   XR C-ARM NO REPORT    Follow-up: No follow-ups on file.   Procedures: Large Joint Inj: R hip joint on 11/27/2021 10:36 AM Indications: diagnostic evaluation and pain Details: 22 G 3.5 in needle, fluoroscopy-guided anterior approach  Arthrogram: No  Medications: 4 mL bupivacaine 0.25 %; 60 mg triamcinolone acetonide 40 MG/ML Outcome: tolerated well, no immediate complications  There was excellent flow of contrast producing a partial arthrogram of the hip. The patient did have relief of symptoms during the anesthetic phase of the injection. Procedure, treatment alternatives, risks and benefits explained, specific risks discussed. Consent was given by the patient. Immediately prior to procedure a time out was called to verify the correct patient, procedure, equipment, support staff and site/side marked as required. Patient was prepped and draped in the usual sterile fashion.         Clinical History: No specialty comments available.     Objective:  VS:  HT:    WT:   BMI:     BP:   HR: bpm  TEMP: ( )  RESP:  Physical Exam   Imaging: No results found.

## 2021-11-27 NOTE — Progress Notes (Unsigned)
Pt state right hip pain. Pt state walking and standing makes the pain worse. Pt state she takes over the counter pain meds to help ease her pain.  Numeric Pain Rating Scale and Functional Assessment Average Pain 8   In the last MONTH (on 0-10 scale) has pain interfered with the following?  1. General activity like being  able to carry out your everyday physical activities such as walking, climbing stairs, carrying groceries, or moving a chair?  Rating(10)   -BT, -Dye Allergies.  

## 2021-11-28 MED ORDER — TRIAMCINOLONE ACETONIDE 40 MG/ML IJ SUSP
60.0000 mg | INTRAMUSCULAR | Status: AC | PRN
  Administered 2021-11-27: 60 mg via INTRA_ARTICULAR

## 2021-11-28 MED ORDER — BUPIVACAINE HCL 0.25 % IJ SOLN
4.0000 mL | INTRAMUSCULAR | Status: AC | PRN
  Administered 2021-11-27: 4 mL via INTRA_ARTICULAR

## 2021-12-11 ENCOUNTER — Other Ambulatory Visit: Payer: Self-pay | Admitting: Adult Health

## 2021-12-12 ENCOUNTER — Ambulatory Visit (INDEPENDENT_AMBULATORY_CARE_PROVIDER_SITE_OTHER): Payer: Medicare Other | Admitting: Orthopaedic Surgery

## 2021-12-12 ENCOUNTER — Encounter: Payer: Self-pay | Admitting: Orthopaedic Surgery

## 2021-12-12 DIAGNOSIS — M25551 Pain in right hip: Secondary | ICD-10-CM | POA: Diagnosis not present

## 2021-12-12 NOTE — Progress Notes (Signed)
Amber Bennett is in today at least every 2 weeks after having a steroid injection in her right hip joint under fluoroscopy.  She says her hip feels great.  She does have significant moderate arthritis in that right hip and this is affecting her back which has had chronic issues.  She said that injections helped greatly and she is walking without a limp and has really no significant pain other than her still chronic back pain which is manageable.  We have talked about the possibility at some point of proceeding with hip replacement surgery.  She is does state if her pain gets bad enough that she would consider that.  On exam her right hip moves smoothly and fluidly as is her left hip.  She is walking without a limp or an assistive device.  I will see her back in 2 months to see how she is doing overall but no x-rays are needed.  We will still consider hip replacement surgery in the future if her pain gets severe again.

## 2021-12-17 ENCOUNTER — Telehealth: Payer: Self-pay | Admitting: Orthopaedic Surgery

## 2021-12-17 NOTE — Telephone Encounter (Signed)
Patient called asked if Dr. Ninfa Linden would call her. Patient said she would like to go ahead an schedule surgery for February 04, 2022. The number to contact patient is [phone]

## 2021-12-20 ENCOUNTER — Encounter: Payer: Self-pay | Admitting: Adult Health

## 2021-12-20 ENCOUNTER — Ambulatory Visit (INDEPENDENT_AMBULATORY_CARE_PROVIDER_SITE_OTHER): Payer: Medicare Other | Admitting: Adult Health

## 2021-12-20 VITALS — BP 150/85 | HR 85 | Ht 59.0 in | Wt 163.0 lb

## 2021-12-20 DIAGNOSIS — M545 Low back pain, unspecified: Secondary | ICD-10-CM

## 2021-12-20 DIAGNOSIS — R569 Unspecified convulsions: Secondary | ICD-10-CM | POA: Diagnosis not present

## 2021-12-20 DIAGNOSIS — G8929 Other chronic pain: Secondary | ICD-10-CM

## 2021-12-20 MED ORDER — GABAPENTIN 800 MG PO TABS: 800.0000 mg | ORAL_TABLET | Freq: Two times a day (BID) | ORAL | 3 refills | Status: DC

## 2021-12-20 MED ORDER — TOPIRAMATE 50 MG PO TABS: ORAL_TABLET | ORAL | 3 refills | Status: DC

## 2021-12-20 MED ORDER — PHENOBARBITAL 97.2 MG PO TABS: 145.8000 mg | ORAL_TABLET | Freq: Every day | ORAL | 1 refills | Status: DC

## 2021-12-20 NOTE — Progress Notes (Signed)
PATIENT: Amber Bennett DOB: 09/29/1950  REASON FOR VISIT: follow up HISTORY FROM: patient Primary neurologist: Dr. Jannifer Franklin  Chief Complaint  Patient presents with   Follow-up    Pt in 4 with husband  pt is here for seizures follow up pt as no questions or concerns for todays visit      HISTORY OF PRESENT ILLNESS: Today 12/20/21:  Amber Bennett is a 71 year old female with a history of seizures.  She returns today for follow-up.  Overall she feels that she is doing well.  Denies any seizure events.  Continues on Topamax and phenobarbital.  She operates a motor vehicle without difficulty.  Able to complete all ADLs independently.  She has chronic history of low back pain.  Continues on gabapentin and Cymbalta.  Reports that this continues to work well for her.  Have found that she had two tears in the right hip which could be the cause of some of back pain. 8/15 will have a total hip replacement.    02/07/21 Amber Bennett is a 71 year old female with a history of seizures. She returns today for follow-up. She remains on topmax and phenobarbital. Denies any siezure events. Operates a motor vehicle without difficulty. Completes all ADLS independently. Continues on gabapentin and Cymbalta for back pain. Returns today for follow-up.   02/02/20: Amber Bennett is a 71 year old female with a history of seizures.  She returns today for follow-up.  She remains on Topamax and phenobarbital.  Denies any seizure events.  No changes with her gait or balance.  She also has a history of low back pain.  She continues to take gabapentin and Cymbalta.  Reports that this continues to work well for her.  She returns today for an evaluation.  HISTORY Amber Bennett is a 71 year old right-handed white female with history of seizures that have been well controlled on Topamax and phenobarbital.  The patient has not had any seizures since last seen.  She does have a history of low back pain that has worsened slightly as she is  packing boxes to move.  She takes gabapentin as well for her low back and is on Cymbalta.  She returns to this office for an evaluation.  On an annual basis, her primary care physician checks the phenobarbital level.  The last level 1 year ago was 23  REVIEW OF SYSTEMS: Out of a complete 14 system review of symptoms, the patient complains only of the following symptoms, and all other reviewed systems are negative.  See HPI  ALLERGIES: Allergies  Allergen Reactions   Tylenol [Acetaminophen] Swelling and Other (See Comments)    Throat swelling, anaphylaxis   Latex Itching    Rash     HOME MEDICATIONS: Outpatient Medications Prior to Visit  Medication Sig Dispense Refill   amLODipine (NORVASC) 2.5 MG tablet Take 2.5 mg by mouth daily.     BIOTIN PO Take by mouth.     Calcium Carbonate-Vitamin D (CALCIUM + D PO) Take by mouth daily.     Coenzyme Q10 (CO Q 10 PO) Take 100 mg by mouth daily.     Cyanocobalamin (VITAMIN B 12 PO) Take 1,000 mg by mouth daily.     diphenoxylate-atropine (LOMOTIL) 2.5-0.025 MG tablet Take 1 tablet by mouth 4 (four) times daily as needed for diarrhea or loose stools. 120 tablet 2   DULoxetine (CYMBALTA) 30 MG capsule TK 1 C PO QPM  5   ezetimibe (ZETIA) 10 MG tablet Take 10 mg  by mouth daily.     fluticasone (FLONASE) 50 MCG/ACT nasal spray Place 2 sprays into both nostrils daily.      gabapentin (NEURONTIN) 800 MG tablet TAKE 1 TABLET(800 MG) BY MOUTH TWICE DAILY 180 tablet 1   loratadine (CLARITIN) 10 MG tablet Take 10 mg by mouth daily.     montelukast (SINGULAIR) 10 MG tablet Take 10 mg by mouth daily.     Multiple Vitamins-Minerals (MULTIVITAMIN PO) Take by mouth daily.     Multiple Vitamins-Minerals (PRESERVISION AREDS PO) Take by mouth.     omega-3 acid ethyl esters (LOVAZA) 1 G capsule Take 1 capsule by mouth 4 (four) times daily.      omeprazole (PRILOSEC) 20 MG capsule TK 1 T PO QD  3   OZEMPIC 1 MG/DOSE SOPN INJECT 1 MG INTO THE SKIN ONCE A WEEK  6    PHENobarbital (LUMINAL) 97.2 MG tablet TAKE 1 AND 1/2 TABLETS BY MOUTH EVERY DAY 135 tablet 1   potassium chloride (KLOR-CON) 20 MEQ packet Take by mouth 2 (two) times daily.     PRALUENT 150 MG/ML SOAJ Inject 1 mL into the skin every 14 (fourteen) days.     Probiotic Product (PROBIOTIC PO) Take 1 Dose by mouth daily.     telmisartan (MICARDIS) 40 MG tablet Take 40 mg by mouth daily.      topiramate (TOPAMAX) 50 MG tablet TAKE 1 TABLET BY MOUTH EVERY MORNING AND 2 TABLETS EVERY EVENING 270 tablet 0   vitamin E 400 UNIT capsule Take 400 Units by mouth daily.     No facility-administered medications prior to visit.    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   Carpal tunnel syndrome    Cervical radiculitis    Dermatomyositis (HCC)    Elevated liver enzymes    External hemorrhoids    Fatty liver    Hematuria    negative evaluation   High cholesterol    HTN (hypertension)    Internal hemorrhoids    Lower back pain    Macular degeneration    Osteopenia    Left side hip   Seizures ([place])    09/07/2019 pt reports last seizure in 2014   Tubular adenoma of colon     PAST SURGICAL HISTORY: Past Surgical History:  Procedure Laterality Date   ABDOMINAL HYSTERECTOMY  [place] SURGERY  07/2015   CARPAL TUNNEL RELEASE     CATARACT EXTRACTION, BILATERAL     CHOLECYSTECTOMY  2003   COLONOSCOPY     EXPLORATORY LAPAROTOMY     LAMINECTOMY     SHOULDER SURGERY Right 2000   TONSILLECTOMY      FAMILY HISTORY: Family History  Problem Relation Age of Onset   Cancer Mother        Cancer of the bone marrow   Heart attack Mother    Stroke Sister    Hypertension Sister    Diabetes Sister    Colon cancer Maternal Aunt    Seizures Neg Hx     SOCIAL HISTORY: Social History   Socioeconomic History   Marital status: Married    Spouse name: Not on file    Number of children: 2   Years of education: 14   Highest education level: Not on file  Occupational History   Occupation: Employed  Comment: Works for [employer], [employer] and [employer]  Tobacco Use   Smoking status: Former    Types: Cigarettes    Quit date: 07/01/1978    Years since quitting: 43.5   Smokeless tobacco: Never  Vaping Use   Vaping Use: Never used  Substance and Sexual Activity   Alcohol use: Yes    Alcohol/week: 7.0 standard drinks of alcohol    Types: 7 Glasses of wine per week    Comment: Consumes wine with dinner/socially   Drug use: No   Sexual activity: Yes    Partners: Male    Birth control/protection: Surgical    Comment: TAH  Other Topics Concern   Not on file  Social History Narrative   Patient lives at home with her husband Zada Finders).   Retired.   Education two years of business college.   Right handed.   Caffeine     Social Determinants of Health   Financial Resource Strain: Not on file  Food Insecurity: Not on file  Transportation Needs: Not on file  Physical Activity: Not on file  Stress: Not on file  Social Connections: Not on file  Intimate Partner Violence: Not on file      PHYSICAL EXAM  Vitals:   12/20/21 1119  BP: (!) 150/85  Pulse: 85  Weight: 163 lb (73.9 kg)  Height: '4\' 11"'$  (1.499 m)   Body mass index is 32.92 kg/m.  Generalized: Well developed, in no acute distress   Neurological examination  Mentation: Alert oriented to time, place, history taking. Follows all commands speech and language fluent Cranial nerve II-XII: Pupils were equal round reactive to light. Extraocular movements were full, visual field were full on confrontational test. Head turning and shoulder shrug  were normal and symmetric. Motor: The motor testing reveals 5 over 5 strength of all 4 extremities. Good symmetric motor tone is noted throughout.  Sensory: Sensory testing is intact to soft touch on all 4 extremities. No evidence of extinction is  noted.  Coordination: Cerebellar testing reveals good finger-nose-finger and heel-to-shin bilaterally.  Gait and station: Gait is normal.  Reflexes: Deep tendon reflexes are symmetric and normal bilaterally.   DIAGNOSTIC DATA (LABS, IMAGING, TESTING) - I reviewed patient records, labs, notes, testing and imaging myself where available.     ASSESSMENT AND PLAN 71 y.o. year old female  has a past medical history of Atherosclerosis of aorta ([place]), Bertolotti's syndrome, Carpal tunnel syndrome, Cervical radiculitis, Dermatomyositis ([place]), Elevated liver enzymes, External hemorrhoids, Fatty liver, Hematuria, High cholesterol, HTN (hypertension), Internal hemorrhoids, Lower back pain, Macular degeneration, Osteopenia, Seizures ([place]), and Tubular adenoma of colon. here with:  Seizures  Continue phenobarbital 97.2 mg 1 1/2 tablets daily  Continue Topamax 50 mg in the AM, 100 mg in the PM Levels are checked by her PCP  Low back pain  Continue  gabapentin 800 mg BID. May be able to reduce the in the future if she gets relief after her hip surgery  FU 1 year      Advised if symptoms worsen or she develops new symptoms she should let us know follow-up in 1 year or sooner if needed   Ward Givens, MSN, NP-C 12/20/2021, 11:25 AM [facility] [address] [place], [place] [address] [phone]

## 2021-12-20 NOTE — Patient Instructions (Signed)
Seizures  Continue phenobarbital 97.2 mg 1 1/2 tablets daily  Continue Topamax 50 mg in the AM, 100 mg in the PM   Low back pain  Continue  gabapentin 800 mg BID. May be able to reduce the in the future if you gets relief after hip surgery

## 2021-12-25 ENCOUNTER — Other Ambulatory Visit: Payer: Self-pay

## 2022-01-25 ENCOUNTER — Other Ambulatory Visit: Payer: Self-pay | Admitting: Physician Assistant

## 2022-01-25 DIAGNOSIS — M1611 Unilateral primary osteoarthritis, right hip: Secondary | ICD-10-CM

## 2022-01-30 ENCOUNTER — Encounter: Payer: Self-pay | Admitting: Orthopaedic Surgery

## 2022-01-30 ENCOUNTER — Telehealth: Payer: Self-pay | Admitting: Physical Medicine and Rehabilitation

## 2022-01-30 ENCOUNTER — Ambulatory Visit (INDEPENDENT_AMBULATORY_CARE_PROVIDER_SITE_OTHER): Payer: Medicare Other | Admitting: Orthopaedic Surgery

## 2022-01-30 DIAGNOSIS — M25551 Pain in right hip: Secondary | ICD-10-CM | POA: Diagnosis not present

## 2022-01-30 DIAGNOSIS — M1611 Unilateral primary osteoarthritis, right hip: Secondary | ICD-10-CM | POA: Diagnosis not present

## 2022-01-30 NOTE — Telephone Encounter (Signed)
Amber Bennett would like to pass a message on to Dr. Ernestina Patches thanking him for the hip injections. She was able to enjoy 2 vacations due to the injection and she is very grateful!

## 2022-01-30 NOTE — Progress Notes (Signed)
Amber Bennett comes in today to talk again about her right hip replacement surgery that we are performing this month on August 15.  She has appropriate questions about what is involved with the surgery as well as her intraoperative and postoperative course.  We discussed the risk and benefits of the surgery.  She has well-documented severe arthritis in her right hip.  She has worked on weight loss and activity modification.  She is on medications for her blood glucose and she has excellent control of her blood sugars.  She is working on her blood pressure control.  She has been taking Aleve for pain and is going to stop this in the next week.  Showed her hip replacement model again and described in detail what the surgery involves from beginning to end.  We talked about her intraoperative and postoperative course and what to expect.  All questions and concerns were answered and addressed.  We will see her in 2 weeks for her right hip replacement.

## 2022-02-04 NOTE — Pre-Procedure Instructions (Signed)
Surgical Instructions    Your procedure is scheduled on Tuesday, August 15th.  Report to [facility] Main Entrance "A" at 10:00 A.M., then check in with the Admitting office.  Call this number if you have problems the morning of surgery:  [phone]   If you have any questions prior to your surgery date call [phone]: Open Monday-Friday 8am-4pm    Remember:  Do not eat after midnight the night before your surgery  You may drink clear liquids until 9:00 a.m. the morning of your surgery.   Clear liquids allowed are: Water, Non-Citrus Juices (without pulp), Carbonated Beverages, Clear Tea, Black Coffee Only (NO MILK, CREAM OR POWDERED CREAMER of any kind), and Gatorade.   Enhanced Recovery after Surgery for Orthopedics Enhanced Recovery after Surgery is a protocol used to improve the stress on your body and your recovery after surgery.  Patient Instructions  The day of surgery (if you do NOT have diabetes):  Drink ONE (1) Pre-Surgery Clear Ensure by 9:00 am the morning of surgery   This drink was given to you during your hospital  pre-op appointment visit. Nothing else to drink after completing the  Pre-Surgery Clear Ensure.         If you have questions, please contact your surgeon's office.     Take these medicines the morning of surgery with A SIP OF WATER  gabapentin (NEURONTIN) loratadine (CLARITIN)   Take these medications as needed: diphenoxylate-atropine (LOMOTIL) fluticasone (FLONASE)  Nasal spray  As of today, STOP taking any Aspirin (unless otherwise instructed by your surgeon) Aleve, Naproxen, Ibuprofen, Motrin, Advil, Goody's, BC's, all herbal medications, fish oil, and all vitamins.  The day of surgery, do not take other diabetes injectables, including OZEMPIC.             Do NOT Smoke (Tobacco/Vaping) for 24 hours prior to your procedure.  If you use a CPAP at night, you may bring your mask/headgear for your overnight stay.   Contacts, glasses,  piercing's, hearing aid's, dentures or partials may not be worn into surgery, please bring cases for these belongings.    For patients admitted to the hospital, discharge time will be determined by your treatment team.   Patients discharged the day of surgery will not be allowed to drive home, and someone needs to stay with them for 24 hours.  SURGICAL WAITING ROOM VISITATION Patients having surgery or a procedure may have no more than 2 support people in the waiting area - these visitors may rotate.   Children under the age of 67 must have an adult with them who is not the patient. If the patient needs to stay at the hospital during part of their recovery, the visitor guidelines for inpatient rooms apply. Pre-op nurse will coordinate an appropriate time for 1 support person to accompany patient in pre-op.  This support person may not rotate.   Please refer to the [facility] website for the visitor guidelines for Inpatients (after your surgery is over and you are in a regular room).    Special instructions:   [facility]- Preparing For Surgery  Before surgery, you can play an important role. Because skin is not sterile, your skin needs to be as free of germs as possible. You can reduce the number of germs on your skin by washing with CHG (chlorahexidine gluconate) Soap before surgery.  CHG is an antiseptic cleaner which kills germs and bonds with the skin to continue killing germs even after washing.    Oral  Hygiene is also important to reduce your risk of infection.  Remember - BRUSH YOUR TEETH THE MORNING OF SURGERY WITH YOUR REGULAR TOOTHPASTE  Please do not use if you have an allergy to CHG or antibacterial soaps. If your skin becomes reddened/irritated stop using the CHG.  Do not shave (including legs and underarms) for at least 48 hours prior to first CHG shower. It is OK to shave your face.  Please follow these instructions carefully.   Shower the NIGHT BEFORE SURGERY and the  MORNING OF SURGERY  If you chose to wash your hair, wash your hair first as usual with your normal shampoo.  After you shampoo, rinse your hair and body thoroughly to remove the shampoo.  Use CHG Soap as you would any other liquid soap. You can apply CHG directly to the skin and wash gently with a scrungie or a clean washcloth.   Apply the CHG Soap to your body ONLY FROM THE NECK DOWN.  Do not use on open wounds or open sores. Avoid contact with your eyes, ears, mouth and genitals (private parts). Wash Face and genitals (private parts)  with your normal soap.   Wash thoroughly, paying special attention to the area where your surgery will be performed.  Thoroughly rinse your body with warm water from the neck down.  DO NOT shower/wash with your normal soap after using and rinsing off the CHG Soap.  Pat yourself dry with a CLEAN TOWEL.  Wear CLEAN PAJAMAS to bed the night before surgery  Place CLEAN SHEETS on your bed the night before your surgery  DO NOT SLEEP WITH PETS.   Day of Surgery: Take a shower with CHG soap. Do not wear jewelry or makeup Do not wear lotions, powders, perfumes, or deodorant. Do not shave 48 hours prior to surgery.   Do not bring valuables to the hospital.  [facility] is not responsible for any belongings or valuables. Do not wear nail polish, gel polish, artificial nails, or any other type of covering on natural nails (fingers and toes) If you have artificial nails or gel coating that need to be removed by a nail salon, please have this removed prior to surgery. Artificial nails or gel coating may interfere with anesthesia's ability to adequately monitor your vital signs. Wear Clean/Comfortable clothing the morning of surgery Remember to brush your teeth WITH YOUR REGULAR TOOTHPASTE.   Please read over the following fact sheets that you were given.    If you received a COVID test during your pre-op visit  it is requested that you wear a mask when out  in public, stay away from anyone that may not be feeling well and notify your surgeon if you develop symptoms. If you have been in contact with anyone that has tested positive in the last 10 days please notify you surgeon.

## 2022-02-05 ENCOUNTER — Other Ambulatory Visit: Payer: Self-pay

## 2022-02-05 ENCOUNTER — Encounter (HOSPITAL_COMMUNITY)
Admission: RE | Admit: 2022-02-05 | Discharge: 2022-02-05 | Disposition: A | Discharge status: Home / Self Care | Payer: Medicare Other | Source: Ambulatory Visit | Attending: Orthopaedic Surgery | Admitting: Orthopaedic Surgery

## 2022-02-05 ENCOUNTER — Encounter (HOSPITAL_COMMUNITY): Payer: Self-pay

## 2022-02-05 VITALS — BP 142/58 | HR 87 | Temp 98.2°F | Resp 17 | Ht <= 58 in | Wt 163.0 lb

## 2022-02-05 DIAGNOSIS — Z01818 Encounter for other preprocedural examination: Secondary | ICD-10-CM | POA: Diagnosis not present

## 2022-02-05 DIAGNOSIS — M1611 Unilateral primary osteoarthritis, right hip: Secondary | ICD-10-CM | POA: Diagnosis not present

## 2022-02-05 DIAGNOSIS — K769 Liver disease, unspecified: Secondary | ICD-10-CM | POA: Insufficient documentation

## 2022-02-05 DIAGNOSIS — E119 Type 2 diabetes mellitus without complications: Secondary | ICD-10-CM | POA: Insufficient documentation

## 2022-02-05 HISTORY — DX: Depression, unspecified: F32.A

## 2022-02-05 HISTORY — DX: Type 2 diabetes mellitus without complications: E11.9

## 2022-02-05 LAB — CBC
HCT: 39 % (ref 36.0–46.0)
Hemoglobin: 13.4 g/dL (ref 12.0–15.0)
MCH: 34.3 pg — ABNORMAL HIGH (ref 26.0–34.0)
MCHC: 34.4 g/dL (ref 30.0–36.0)
MCV: 99.7 fL (ref 80.0–100.0)
Platelets: 187 10*3/uL (ref 150–400)
RBC: 3.91 MIL/uL (ref 3.87–5.11)
RDW: 12 % (ref 11.5–15.5)
WBC: 7.3 10*3/uL (ref 4.0–10.5)
nRBC: 0 % (ref 0.0–0.2)

## 2022-02-05 LAB — TYPE AND SCREEN
ABO/RH(D): O POS
Antibody Screen: NEGATIVE

## 2022-02-05 LAB — HEMOGLOBIN A1C
Hgb A1c MFr Bld: 4.9 % (ref 4.8–5.6)
Mean Plasma Glucose: 93.93 mg/dL

## 2022-02-05 LAB — COMPREHENSIVE METABOLIC PANEL
ALT: 82 U/L — ABNORMAL HIGH (ref 0–44)
AST: 106 U/L — ABNORMAL HIGH (ref 15–41)
Albumin: 3.8 g/dL (ref 3.5–5.0)
Alkaline Phosphatase: 95 U/L (ref 38–126)
Anion gap: 6 (ref 5–15)
BUN: 10 mg/dL (ref 8–23)
CO2: 25 mmol/L (ref 22–32)
Calcium: 9.4 mg/dL (ref 8.9–10.3)
Chloride: 103 mmol/L (ref 98–111)
Creatinine, Ser: 0.49 mg/dL (ref 0.44–1.00)
GFR, Estimated: >60 mL/min (ref >60)
Glucose, Bld: 108 mg/dL — ABNORMAL HIGH (ref 70–99)
Potassium: 4.5 mmol/L (ref 3.5–5.1)
Sodium: 134 mmol/L — ABNORMAL LOW (ref 135–145)
Total Bilirubin: 0.5 mg/dL (ref 0.3–1.2)
Total Protein: 7.4 g/dL (ref 6.5–8.1)

## 2022-02-05 LAB — SURGICAL PCR SCREEN
MRSA, PCR: NEGATIVE
Staphylococcus aureus: POSITIVE — AB

## 2022-02-05 LAB — GLUCOSE, CAPILLARY: Glucose-Capillary: 153 mg/dL — ABNORMAL HIGH (ref 70–99)

## 2022-02-05 NOTE — Progress Notes (Signed)
PCP - Dr. Crist Infante Cardiologist - Dr. Bertrum Sol  PPM/ICD - n/a  Chest x-ray - n/a EKG - 02/05/22 Stress Test - denies ECHO - denies Cardiac Cath - denies  Sleep Study - denies CPAP - denies  Fasting Blood Sugar - 111-130 Checks Blood Sugar every 3-4 days. CBG 153 at PAT. Will obtain A1C.  Blood Thinner Instructions: n/a Aspirin Instructions: n/a  ERAS Protcol -Clear liquids until 0900 DOS PRE-SURGERY Ensure or G2- G2 provided.  COVID TEST- n/a  Anesthesia review: Yes, EKG review.  Patient denies shortness of breath, fever, cough and chest pain at PAT appointment   All instructions explained to the patient, with a verbal understanding of the material. Patient agrees to go over the instructions while at home for a better understanding. Patient also instructed to self quarantine after being tested for COVID-19. The opportunity to ask questions was provided.

## 2022-02-06 NOTE — Progress Notes (Addendum)
Anesthesia Chart Review:  Case: [id number] Date/Time: 02/12/22 1145   Procedure: RIGHT TOTAL HIP ARTHROPLASTY ANTERIOR APPROACH (Right: Hip)   Anesthesia type: Spinal   Pre-op diagnosis: osteoarthritis / degenerative joint disease right hip   Location: [facility] OR ROOM 07 / [place] [facility]   Surgeons: Mcarthur Rossetti, MD       DISCUSSION: Patient is a 71 year old female scheduled for the above procedure.  History includes former smoker (quit 07/01/78), HTN, hypercholesterolemia, aortic atherosclerosis (mild of descending aorta 2019), DM2, dermatomyositis, seizures (last 2014), fatty liver, cholecystectomy (05/16/02), spinal surgery (L4-5 right laminectomy 02/20/15; bilateral pedicle screw rod instrumented fusion L4-S1 07/27/15). 2019 Coronary calcium score was 0, no coronary artery calcifications with mild atherosclerosis of the descending aorta. BMI is consistent with obesity.  She denied SOB and chest pain at PAT RN visit. Calcium Score of 0 in 2019. A1c 4.9%. Labs did show elevated LFTs which appears at least somewhat chronic when compared to labs from [facility]). She does have known fatty liver disease. Most recent results include: 02/05/22 [facility]): AST 106, ALT 82. Normal alk phos, total bili, platelet count 09/24/21 ([facility]): AST 70, ALT 80 03/09/21 ([facility]): AST 91, ALT 122 - She had follow-up evaluation at [facility] on 12/02/19 by Bonita Quin, PA for "presumptive nonalcoholic serohepatitis." By notes, patient had abnormal LFTs dating back to early 2018 with AST 83 and ALT 112. 10/04/16 US showed increased echogenicity consistent with fatty infiltration. Alcohol intake at that time was 1-2 glassed of white wine per night. Had blood transfusion in 1979 during childbirth, but had had a negative hepatitis C test. Transelastography 8.9 kPa on 01/02/2017 with an IQ R median of 25%. Liver biopsy was considered but there was some improvement in her LFTs after weight loss, so biopsy was deferred.  One year follow-up had been planned but appears she has been followed by primary care.  Will request last office notes from Dr. Joylene Draft, but overall LFTs appears stable over the past year. No thrombocytopenia.   UPDATE 02/11/22 3:45 PM:  Re-requested PCP records this morning and have now received office visit and labs from March 2023.  Last office visit 09/28/21 with labs on 09/21/21. Dr. Pryor Curia notes history of fatty liver disease and elevated LFTs. AST 70 and ALT 80 on 09/21/21, A1c 4.9%, H/H 14.5/41.6, PLT 164K, TSH 2.58. Total cholesterol was high at 269 (LDL 146, HDL 64), so he prescribed Praluent since she was intolerant to statins and Welchol. He recommended continued monitoring of fatty liver disease/elevated LFTs with recommendation to work on low carb diet, weight loss, and exercise, continue GLP-1 drug (on Ozempic 1 mg Q Friday).  One year follow-up planned.   Anesthesia team to evaluate on the day of surgery.    VS: BP (!) 142/58   Pulse 87   Temp 36.8 C (Oral)   Resp 17   Ht _0  (1.473 m)   Wt 73.9 kg   LMP 07/01/1980   SpO2 99%   BMI 34.07 kg/m    PROVIDERS: Crist Infante, MD is PCP  Ernestine Mcmurray, NP is neurology provider Leigh Aurora, MD is rheumatologist Jarome Matin, MD is dermatologist Richmond Campbell, MD is GI; Charlean Sanfilippo, MD is hepatologist (last encounters seen were in 2021) She is not routinely followed by a cardiologist.   LABS: Preoperative labs noted. See DISCUSSION. (all labs ordered are listed, but only abnormal results are displayed)  Labs Reviewed  SURGICAL PCR SCREEN - Abnormal; Notable for the following components:  Result Value   Staphylococcus aureus POSITIVE (*)    All other components within normal limits  GLUCOSE, CAPILLARY - Abnormal; Notable for the following components:   Glucose-Capillary 153 (*)    All other components within normal limits  COMPREHENSIVE METABOLIC PANEL - Abnormal; Notable for the following components:    Sodium 134 (*)    Glucose, Bld 108 (*)    AST 106 (*)    ALT 82 (*)    All other components within normal limits  CBC - Abnormal; Notable for the following components:   MCH 34.3 (*)    All other components within normal limits  HEMOGLOBIN A1C  TYPE AND SCREEN   A1c 4.9% 09/21/21.   IMAGES: MRI Right hip 11/02/21: IMPRESSION: Moderate right hip osteoarthritis with degenerative superior labral tearing anteriorly and posteriorly.  CT L-spine 04/02/21: IMPRESSION: 1. Posterior fusion hardware from L4 through the sacrum is new since 2016 with no adverse hardware features. Evidence of some posterior element arthrodesis at L4-L5. 2. But absent fusion at L5-S1 and progressed chronic facet arthropathy there and new bilateral vacuum facet. This contributes to at least moderate chronic left L5 neural foraminal stenosis which might have progressed. 3. Mild to moderate adjacent segment disease at L3-L4 with facet degeneration greater on the left. Mild new spinal and bilateral L3 foraminal stenosis since 2016. 4. Chronic L5 compression fracture with mild retropulsion appears stable since 2016. 5. Aortic Atherosclerosis (ICD10-I70.0).    EKG: 02/05/22: Sinus rhythm with 1st degree A-V block Septal infarct , age undetermined Abnormal ECG - Comparison EKG in [facility] from 03/11/02 showed NSR, occasional PVC, PR 198 ms. Low r wave in V2. Copy on shadow chart.    CV:  CT Coronary Calcium Score 07/16/17: IMPRESSION: 1. No coronary artery calcifications. 2. Total Agatston Score: 0 3. Mild atherosclerotic calcification of the descending aorta.   Per 09/28/21 office note by Crist Infante, MD: "Neg stress echo 9/07 ef 55% all okay then. 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   Carpal tunnel  syndrome    Cervical radiculitis    Depression    Dermatomyositis ([place])    Diabetes mellitus without complication (HCC)    Elevated liver enzymes    External hemorrhoids    Fatty liver    Hematuria    negative evaluation   High cholesterol    HTN (hypertension)    Internal hemorrhoids    Lower back pain    Macular degeneration    Osteopenia    Left side hip   Seizures ([place])    09/07/2019 pt reports last seizure in 2014   Tubular adenoma of colon     Past Surgical History:  Procedure Laterality Date   ABDOMINAL HYSTERECTOMY  1982   APPENDECTOMY     BACK SURGERY     BACK SURGERY  07/2015   CARPAL TUNNEL RELEASE     CATARACT EXTRACTION, BILATERAL     CHOLECYSTECTOMY  2003   COLONOSCOPY     EXPLORATORY LAPAROTOMY     LAMINECTOMY     SHOULDER SURGERY Right 2000   TONSILLECTOMY      MEDICATIONS:  amLODipine (NORVASC) 2.5 MG tablet   BIOTIN PO   Calcium Carb-Cholecalciferol (CALTRATE 600+D3 PO)   Cholecalciferol (VITAMIN D) 50 MCG (2000  UT) tablet   Coenzyme Q10 (CO Q 10 PO)   Cyanocobalamin (VITAMIN B 12 PO)   diphenoxylate-atropine (LOMOTIL) 2.5-0.025 MG tablet   DULoxetine (CYMBALTA) 30 MG capsule   ezetimibe (ZETIA) 10 MG tablet   fluticasone (FLONASE) 50 MCG/ACT nasal spray   gabapentin (NEURONTIN) 800 MG tablet   loratadine (CLARITIN) 10 MG tablet   montelukast (SINGULAIR) 10 MG tablet   Multiple Vitamins-Minerals (MULTIVITAMIN PO)   Multiple Vitamins-Minerals (PRESERVISION AREDS PO)   omega-3 acid ethyl esters (LOVAZA) 1 G capsule   omeprazole (PRILOSEC) 20 MG capsule   OZEMPIC 1 MG/DOSE SOPN   PHENobarbital (LUMINAL) 97.2 MG tablet   potassium chloride (KLOR-CON) 20 MEQ packet   PRALUENT 150 MG/ML SOAJ   Probiotic Product (PROBIOTIC PO)   pyridoxine (B-6) 100 MG tablet   sodium chloride (OCEAN) 0.65 % SOLN nasal spray   telmisartan (MICARDIS) 40 MG tablet   topiramate (TOPAMAX) 50 MG tablet   vitamin E 400 UNIT capsule   No current  facility-administered medications for this encounter.    Myra Gianotti, PA-C Surgical Short Stay/Anesthesiology [facility] Phone [phone] [facility] Phone [phone] 02/06/2022 3:57 PM

## 2022-02-11 ENCOUNTER — Telehealth: Payer: Self-pay | Admitting: *Deleted

## 2022-02-11 ENCOUNTER — Ambulatory Visit: Payer: Medicare Other | Admitting: Orthopaedic Surgery

## 2022-02-11 NOTE — Progress Notes (Signed)
Left message on home phone and cell phone with new arrival time of 0900

## 2022-02-11 NOTE — Anesthesia Preprocedure Evaluation (Addendum)
Anesthesia Evaluation  Patient identified by MRN, date of birth, ID band Patient awake    Reviewed: Allergy & Precautions, NPO status , Patient's Chart, lab work & pertinent test results  Airway Mallampati: III  TM Distance: >3 FB Neck ROM: Full    Dental  (+) Caps, Dental Advisory Given,    Pulmonary neg pulmonary ROS, former smoker,    Pulmonary exam normal breath sounds clear to auscultation       Cardiovascular hypertension, + CAD  Normal cardiovascular exam Rhythm:Regular Rate:Normal     Neuro/Psych Seizures -, Well Controlled,  PSYCHIATRIC DISORDERS Depression    GI/Hepatic negative GI ROS, Neg liver ROS,   Endo/Other  diabetes, Type 2  Renal/GU negative Renal ROS  negative genitourinary   Musculoskeletal  (+) Arthritis ,   Abdominal   Peds  Hematology negative hematology ROS (+)   Anesthesia Other Findings History includes former smoker (quit 07/01/78), HTN, hypercholesterolemia, aortic atherosclerosis (mild of descending aorta 2019), DM2, dermatomyositis, seizures (last 2014), fatty liver, cholecystectomy (05/16/02), spinal surgery (L4-5 right laminectomy 02/20/15; bilateral pedicle screw rod instrumented fusion L4-S1 07/27/15). 2019 Coronary calcium score was 0, no coronary artery calcifications with mild atherosclerosis of the descending aorta. BMI is consistent with obesity  Reproductive/Obstetrics                           Anesthesia Physical Anesthesia Plan  ASA: 2  Anesthesia Plan: Spinal   Post-op Pain Management:    Induction:   PONV Risk Score and Plan: 2 and Treatment may vary due to age or medical condition  Airway Management Planned: Natural Airway  Additional Equipment:   Intra-op Plan:   Post-operative Plan:   Informed Consent: I have reviewed the patients History and Physical, chart, labs and discussed the procedure including the risks, benefits and alternatives  for the proposed anesthesia with the patient or authorized representative who has indicated his/her understanding and acceptance.     Dental advisory given  Plan Discussed with: CRNA  Anesthesia Plan Comments: (PAT note written by Myra Gianotti, PA-C. )      Anesthesia Quick Evaluation

## 2022-02-11 NOTE — Care Plan (Signed)
[facility] RNCM call to patient prior to her upcoming Right total hip arthroplasty with Dr. Ninfa Linden on 02/12/22. She is an Ortho bundle patient through [facility] and is agreeable to case management. She lives with her spouse, who will be assisting at home after discharge. She has a RW already and will need a 3in1/BSC she requested. Will order this from hospital. Anticipate HHPT will be needed after a short hospital stay. Referral made to [facility] after choice provided. Reviewed all post op care instructions. Will continue to follow for needs.

## 2022-02-11 NOTE — Telephone Encounter (Signed)
Ortho bundle pre-op call completed. 

## 2022-02-12 ENCOUNTER — Ambulatory Visit (HOSPITAL_COMMUNITY): Payer: Medicare Other

## 2022-02-12 ENCOUNTER — Observation Stay (HOSPITAL_COMMUNITY): Payer: Medicare Other

## 2022-02-12 ENCOUNTER — Other Ambulatory Visit: Payer: Self-pay

## 2022-02-12 ENCOUNTER — Encounter (HOSPITAL_COMMUNITY)
Admission: RE | Disposition: A | Discharge status: Home / Self Care | Payer: Self-pay | Source: Home / Self Care | Attending: Orthopaedic Surgery

## 2022-02-12 ENCOUNTER — Observation Stay (HOSPITAL_COMMUNITY)
Admission: RE | Admit: 2022-02-12 | Discharge: 2022-02-14 | Disposition: A | Discharge status: Home / Self Care | Payer: Medicare Other | Attending: Orthopaedic Surgery | Admitting: Orthopaedic Surgery

## 2022-02-12 ENCOUNTER — Encounter (HOSPITAL_COMMUNITY): Payer: Self-pay | Admitting: Orthopaedic Surgery

## 2022-02-12 ENCOUNTER — Ambulatory Visit (HOSPITAL_BASED_OUTPATIENT_CLINIC_OR_DEPARTMENT_OTHER): Payer: Medicare Other | Admitting: Anesthesiology

## 2022-02-12 ENCOUNTER — Ambulatory Visit (HOSPITAL_COMMUNITY): Payer: Medicare Other | Admitting: Vascular Surgery

## 2022-02-12 DIAGNOSIS — I10 Essential (primary) hypertension: Secondary | ICD-10-CM | POA: Insufficient documentation

## 2022-02-12 DIAGNOSIS — Z96641 Presence of right artificial hip joint: Secondary | ICD-10-CM | POA: Diagnosis not present

## 2022-02-12 DIAGNOSIS — M1612 Unilateral primary osteoarthritis, left hip: Secondary | ICD-10-CM

## 2022-02-12 DIAGNOSIS — Z87891 Personal history of nicotine dependence: Secondary | ICD-10-CM | POA: Diagnosis not present

## 2022-02-12 DIAGNOSIS — Z91040 Latex allergy status: Secondary | ICD-10-CM | POA: Diagnosis not present

## 2022-02-12 DIAGNOSIS — Z471 Aftercare following joint replacement surgery: Secondary | ICD-10-CM | POA: Diagnosis not present

## 2022-02-12 DIAGNOSIS — M1611 Unilateral primary osteoarthritis, right hip: Principal | ICD-10-CM

## 2022-02-12 DIAGNOSIS — E119 Type 2 diabetes mellitus without complications: Secondary | ICD-10-CM | POA: Insufficient documentation

## 2022-02-12 DIAGNOSIS — M169 Osteoarthritis of hip, unspecified: Secondary | ICD-10-CM | POA: Diagnosis present

## 2022-02-12 HISTORY — PX: TOTAL HIP ARTHROPLASTY: SHX124

## 2022-02-12 LAB — GLUCOSE, CAPILLARY
Glucose-Capillary: 127 mg/dL — ABNORMAL HIGH (ref 70–99)
Glucose-Capillary: 114 mg/dL — ABNORMAL HIGH (ref 70–99)
Glucose-Capillary: 114 mg/dL — ABNORMAL HIGH (ref 70–99)
Glucose-Capillary: 120 mg/dL — ABNORMAL HIGH (ref 70–99)

## 2022-02-12 LAB — ABO/RH: ABO/RH(D): O POS

## 2022-02-12 SURGERY — ARTHROPLASTY, HIP, TOTAL, ANTERIOR APPROACH
Anesthesia: Spinal | Site: Hip | Laterality: Right

## 2022-02-12 MED ORDER — ONDANSETRON HCL 4 MG/2ML IJ SOLN
INTRAMUSCULAR | Status: AC
  Filled 2022-02-12: qty 2

## 2022-02-12 MED ORDER — FENTANYL CITRATE (PF) 100 MCG/2ML IJ SOLN
INTRAMUSCULAR | Status: AC
  Filled 2022-02-12: qty 2

## 2022-02-12 MED ORDER — POVIDONE-IODINE 10 % EX SWAB
2.0000 | Freq: Once | CUTANEOUS | Status: AC
  Administered 2022-02-12: 2 via TOPICAL

## 2022-02-12 MED ORDER — LACTATED RINGERS IV SOLN: INTRAVENOUS | Status: DC

## 2022-02-12 MED ORDER — TRANEXAMIC ACID-NACL 1000-0.7 MG/100ML-% IV SOLN
1000.0000 mg | INTRAVENOUS | Status: AC
  Administered 2022-02-12: 1000 mg via INTRAVENOUS
  Filled 2022-02-12: qty 100

## 2022-02-12 MED ORDER — FENTANYL CITRATE (PF) 100 MCG/2ML IJ SOLN
25.0000 ug | INTRAMUSCULAR | Status: DC | PRN
  Administered 2022-02-12 (×3): 50 ug via INTRAVENOUS

## 2022-02-12 MED ORDER — DOCUSATE SODIUM 100 MG PO CAPS
100.0000 mg | ORAL_CAPSULE | Freq: Two times a day (BID) | ORAL | Status: DC
  Administered 2022-02-12 – 2022-02-14 (×4): 100 mg via ORAL
  Filled 2022-02-12 (×4): qty 1

## 2022-02-12 MED ORDER — CEFAZOLIN SODIUM-DEXTROSE 1-4 GM/50ML-% IV SOLN
1.0000 g | Freq: Four times a day (QID) | INTRAVENOUS | Status: AC
  Administered 2022-02-12 (×2): 1 g via INTRAVENOUS
  Filled 2022-02-12 (×2): qty 50

## 2022-02-12 MED ORDER — METHOCARBAMOL 1000 MG/10ML IJ SOLN: 500.0000 mg | Freq: Four times a day (QID) | INTRAVENOUS | Status: DC | PRN

## 2022-02-12 MED ORDER — ASPIRIN 81 MG PO CHEW
81.0000 mg | CHEWABLE_TABLET | Freq: Two times a day (BID) | ORAL | Status: DC
  Administered 2022-02-12 – 2022-02-14 (×4): 81 mg via ORAL
  Filled 2022-02-12 (×4): qty 1

## 2022-02-12 MED ORDER — PROPOFOL 10 MG/ML IV BOLUS
INTRAVENOUS | Status: AC
  Filled 2022-02-12: qty 20

## 2022-02-12 MED ORDER — POLYETHYLENE GLYCOL 3350 17 G PO PACK: 17.0000 g | PACK | Freq: Every day | ORAL | Status: DC | PRN

## 2022-02-12 MED ORDER — HYDROMORPHONE HCL 1 MG/ML IJ SOLN
0.5000 mg | INTRAMUSCULAR | Status: DC | PRN
  Administered 2022-02-12: 1 mg via INTRAVENOUS
  Filled 2022-02-12: qty 1

## 2022-02-12 MED ORDER — ALUM & MAG HYDROXIDE-SIMETH 200-200-20 MG/5ML PO SUSP: 30.0000 mL | ORAL | Status: DC | PRN

## 2022-02-12 MED ORDER — OXYCODONE HCL 5 MG PO TABS
5.0000 mg | ORAL_TABLET | ORAL | Status: DC | PRN
  Administered 2022-02-12: 10 mg via ORAL
  Administered 2022-02-12: 5 mg via ORAL
  Administered 2022-02-13 (×3): 10 mg via ORAL
  Administered 2022-02-13: 5 mg via ORAL
  Administered 2022-02-13 (×2): 10 mg via ORAL
  Administered 2022-02-14: 5 mg via ORAL
  Administered 2022-02-14: 10 mg via ORAL
  Filled 2022-02-12: qty 2
  Filled 2022-02-12: qty 1
  Filled 2022-02-12 (×3): qty 2
  Filled 2022-02-12: qty 1
  Filled 2022-02-12 (×4): qty 2

## 2022-02-12 MED ORDER — 0.9 % SODIUM CHLORIDE (POUR BTL) OPTIME
TOPICAL | Status: DC | PRN
  Administered 2022-02-12: 1000 mL

## 2022-02-12 MED ORDER — PHENYLEPHRINE 80 MCG/ML (10ML) SYRINGE FOR IV PUSH (FOR BLOOD PRESSURE SUPPORT)
PREFILLED_SYRINGE | INTRAVENOUS | Status: DC | PRN
  Administered 2022-02-12 (×2): 80 ug via INTRAVENOUS

## 2022-02-12 MED ORDER — VITAMIN D 25 MCG (1000 UNIT) PO TABS
2000.0000 [IU] | ORAL_TABLET | Freq: Every day | ORAL | Status: DC
  Administered 2022-02-13 – 2022-02-14 (×2): 2000 [IU] via ORAL
  Filled 2022-02-12 (×3): qty 2

## 2022-02-12 MED ORDER — SODIUM CHLORIDE 0.9 % IV SOLN: INTRAVENOUS | Status: DC

## 2022-02-12 MED ORDER — CHLORHEXIDINE GLUCONATE 0.12 % MT SOLN
15.0000 mL | Freq: Once | OROMUCOSAL | Status: AC
  Administered 2022-02-12: 15 mL via OROMUCOSAL
  Filled 2022-02-12: qty 15

## 2022-02-12 MED ORDER — METOCLOPRAMIDE HCL 5 MG/ML IJ SOLN: 5.0000 mg | Freq: Three times a day (TID) | INTRAMUSCULAR | Status: DC | PRN

## 2022-02-12 MED ORDER — CEFAZOLIN SODIUM-DEXTROSE 2-4 GM/100ML-% IV SOLN
2.0000 g | INTRAVENOUS | Status: AC
  Administered 2022-02-12: 2 g via INTRAVENOUS
  Filled 2022-02-12: qty 100

## 2022-02-12 MED ORDER — GABAPENTIN 400 MG PO CAPS
800.0000 mg | ORAL_CAPSULE | Freq: Two times a day (BID) | ORAL | Status: DC
  Administered 2022-02-12 – 2022-02-14 (×4): 800 mg via ORAL
  Filled 2022-02-12 (×4): qty 2

## 2022-02-12 MED ORDER — EZETIMIBE 10 MG PO TABS
10.0000 mg | ORAL_TABLET | Freq: Every evening | ORAL | Status: DC
  Administered 2022-02-12: 10 mg via ORAL
  Filled 2022-02-12: qty 1

## 2022-02-12 MED ORDER — PHENYLEPHRINE HCL-NACL 20-0.9 MG/250ML-% IV SOLN
INTRAVENOUS | Status: DC | PRN
  Administered 2022-02-12: 70 ug/min via INTRAVENOUS

## 2022-02-12 MED ORDER — PROPOFOL 10 MG/ML IV BOLUS
INTRAVENOUS | Status: DC | PRN
  Administered 2022-02-12 (×2): 20 mg via INTRAVENOUS
  Administered 2022-02-12: 10 mg via INTRAVENOUS
  Administered 2022-02-12: 15 mg via INTRAVENOUS
  Administered 2022-02-12: 20 mg via INTRAVENOUS
  Administered 2022-02-12: 10 mg via INTRAVENOUS
  Administered 2022-02-12: 5 mg via INTRAVENOUS

## 2022-02-12 MED ORDER — ACETAMINOPHEN 325 MG PO TABS: 325.0000 mg | ORAL_TABLET | Freq: Four times a day (QID) | ORAL | Status: DC | PRN

## 2022-02-12 MED ORDER — POTASSIUM CHLORIDE 20 MEQ PO PACK
40.0000 meq | PACK | Freq: Every day | ORAL | Status: DC
  Filled 2022-02-12 (×2): qty 2

## 2022-02-12 MED ORDER — ORAL CARE MOUTH RINSE: 15.0000 mL | Freq: Once | OROMUCOSAL | Status: AC

## 2022-02-12 MED ORDER — EPHEDRINE SULFATE-NACL 50-0.9 MG/10ML-% IV SOSY
PREFILLED_SYRINGE | INTRAVENOUS | Status: DC | PRN
  Administered 2022-02-12 (×2): 5 mg via INTRAVENOUS

## 2022-02-12 MED ORDER — VASOPRESSIN 20 UNIT/ML IV SOLN
INTRAVENOUS | Status: AC
  Filled 2022-02-12: qty 1

## 2022-02-12 MED ORDER — VITAMIN B-6 100 MG PO TABS
100.0000 mg | ORAL_TABLET | Freq: Every day | ORAL | Status: DC
  Administered 2022-02-13: 100 mg via ORAL
  Filled 2022-02-12 (×2): qty 1

## 2022-02-12 MED ORDER — OXYCODONE HCL 5 MG PO TABS: 10.0000 mg | ORAL_TABLET | ORAL | Status: DC | PRN

## 2022-02-12 MED ORDER — ACETAMINOPHEN 500 MG PO TABS: 1000.0000 mg | ORAL_TABLET | Freq: Once | ORAL | Status: DC

## 2022-02-12 MED ORDER — FENTANYL CITRATE (PF) 250 MCG/5ML IJ SOLN
INTRAMUSCULAR | Status: DC | PRN
  Administered 2022-02-12 (×3): 50 ug via INTRAVENOUS
  Administered 2022-02-12: 25 ug via INTRAVENOUS

## 2022-02-12 MED ORDER — PHENOBARBITAL 32.4 MG PO TABS
145.8000 mg | ORAL_TABLET | Freq: Every day | ORAL | Status: DC
  Administered 2022-02-12 – 2022-02-13 (×2): 145.8 mg via ORAL
  Filled 2022-02-12 (×2): qty 1

## 2022-02-12 MED ORDER — LORATADINE 10 MG PO TABS
10.0000 mg | ORAL_TABLET | Freq: Every day | ORAL | Status: DC
  Administered 2022-02-12 – 2022-02-14 (×3): 10 mg via ORAL
  Filled 2022-02-12 (×3): qty 1

## 2022-02-12 MED ORDER — ALBUMIN HUMAN 5 % IV SOLN: INTRAVENOUS | Status: DC | PRN

## 2022-02-12 MED ORDER — DULOXETINE HCL 30 MG PO CPEP
30.0000 mg | ORAL_CAPSULE | Freq: Every evening | ORAL | Status: DC
  Administered 2022-02-12: 30 mg via ORAL
  Filled 2022-02-12: qty 1

## 2022-02-12 MED ORDER — PROPOFOL 500 MG/50ML IV EMUL
INTRAVENOUS | Status: DC | PRN
  Administered 2022-02-12: 85 ug/kg/min via INTRAVENOUS

## 2022-02-12 MED ORDER — ONDANSETRON HCL 4 MG PO TABS: 4.0000 mg | ORAL_TABLET | Freq: Four times a day (QID) | ORAL | Status: DC | PRN

## 2022-02-12 MED ORDER — MENTHOL 3 MG MT LOZG
1.0000 | LOZENGE | OROMUCOSAL | Status: DC | PRN
  Filled 2022-02-12 (×2): qty 9

## 2022-02-12 MED ORDER — DIPHENHYDRAMINE HCL 12.5 MG/5ML PO ELIX: 12.5000 mg | ORAL_SOLUTION | ORAL | Status: DC | PRN

## 2022-02-12 MED ORDER — PHENOL 1.4 % MT LIQD: 1.0000 | OROMUCOSAL | Status: DC | PRN

## 2022-02-12 MED ORDER — VASOPRESSIN 20 UNIT/ML IV SOLN
INTRAVENOUS | Status: DC | PRN
  Administered 2022-02-12: .4 [IU] via INTRAVENOUS
  Administered 2022-02-12: .2 [IU] via INTRAVENOUS
  Administered 2022-02-12: .3 [IU] via INTRAVENOUS
  Administered 2022-02-12: .4 [IU] via INTRAVENOUS
  Administered 2022-02-12: .5 [IU] via INTRAVENOUS
  Administered 2022-02-12: .3 [IU] via INTRAVENOUS
  Administered 2022-02-12: .2 [IU] via INTRAVENOUS
  Administered 2022-02-12: .3 [IU] via INTRAVENOUS
  Administered 2022-02-12: .5 [IU] via INTRAVENOUS
  Administered 2022-02-12: .4 [IU] via INTRAVENOUS

## 2022-02-12 MED ORDER — INSULIN ASPART 100 UNIT/ML IJ SOLN: 0.0000 [IU] | INTRAMUSCULAR | Status: DC | PRN

## 2022-02-12 MED ORDER — METHOCARBAMOL 500 MG PO TABS
500.0000 mg | ORAL_TABLET | Freq: Four times a day (QID) | ORAL | Status: DC | PRN
  Administered 2022-02-12 – 2022-02-13 (×3): 500 mg via ORAL
  Filled 2022-02-12 (×3): qty 1

## 2022-02-12 MED ORDER — ONDANSETRON HCL 4 MG/2ML IJ SOLN: 4.0000 mg | Freq: Four times a day (QID) | INTRAMUSCULAR | Status: DC | PRN

## 2022-02-12 MED ORDER — MONTELUKAST SODIUM 10 MG PO TABS
10.0000 mg | ORAL_TABLET | Freq: Every day | ORAL | Status: DC
  Administered 2022-02-12 – 2022-02-13 (×2): 10 mg via ORAL
  Filled 2022-02-12 (×2): qty 1

## 2022-02-12 MED ORDER — PANTOPRAZOLE SODIUM 40 MG PO TBEC
40.0000 mg | DELAYED_RELEASE_TABLET | Freq: Every day | ORAL | Status: DC
  Administered 2022-02-12 – 2022-02-14 (×3): 40 mg via ORAL
  Filled 2022-02-12 (×3): qty 1

## 2022-02-12 MED ORDER — ONDANSETRON HCL 4 MG/2ML IJ SOLN
INTRAMUSCULAR | Status: DC | PRN
  Administered 2022-02-12: 4 mg via INTRAVENOUS

## 2022-02-12 MED ORDER — METOCLOPRAMIDE HCL 5 MG PO TABS: 5.0000 mg | ORAL_TABLET | Freq: Three times a day (TID) | ORAL | Status: DC | PRN

## 2022-02-12 MED ORDER — FENTANYL CITRATE (PF) 250 MCG/5ML IJ SOLN
INTRAMUSCULAR | Status: AC
  Filled 2022-02-12: qty 5

## 2022-02-12 SURGICAL SUPPLY — 55 items
APL SKNCLS STERI-STRIP NONHPOA (GAUZE/BANDAGES/DRESSINGS) ×1
BAG COUNTER SPONGE SURGICOUNT (BAG) ×3 IMPLANT
BAG SPNG CNTER NS LX DISP (BAG) ×1
BENZOIN TINCTURE PRP APPL 2/3 (GAUZE/BANDAGES/DRESSINGS) ×3 IMPLANT
BLADE CLIPPER SURG (BLADE) IMPLANT
BLADE SAW SGTL 18X1.27X75 (BLADE) ×3 IMPLANT
COVER SURGICAL LIGHT HANDLE (MISCELLANEOUS) ×3 IMPLANT
CUP SECTOR GRIPTON 50MM (Cup) ×1 IMPLANT
DRAPE C-ARM 42X72 X-RAY (DRAPES) ×3 IMPLANT
DRAPE STERI IOBAN 125X83 (DRAPES) ×3 IMPLANT
DRAPE U-SHAPE 47X51 STRL (DRAPES) ×9 IMPLANT
DRSG AQUACEL AG ADV 3.5X10 (GAUZE/BANDAGES/DRESSINGS) ×3 IMPLANT
DURAPREP 26ML APPLICATOR (WOUND CARE) ×3 IMPLANT
ELECT BLADE 4.0 EZ CLEAN MEGAD (MISCELLANEOUS) ×2
ELECT BLADE 6.5 EXT (BLADE) ×1 IMPLANT
ELECT REM PT RETURN 9FT ADLT (ELECTROSURGICAL) ×2
ELECTRODE BLDE 4.0 EZ CLN MEGD (MISCELLANEOUS) ×2 IMPLANT
ELECTRODE REM PT RTRN 9FT ADLT (ELECTROSURGICAL) ×2 IMPLANT
FACESHIELD WRAPAROUND (MASK) ×4 IMPLANT
FACESHIELD WRAPAROUND OR TEAM (MASK) ×4 IMPLANT
GLOVE BIOGEL PI IND STRL 8 (GLOVE) ×4 IMPLANT
GLOVE BIOGEL PI INDICATOR 8 (GLOVE) ×2
GLOVE ECLIPSE 8.0 STRL XLNG CF (GLOVE) ×3 IMPLANT
GLOVE ORTHO TXT STRL SZ7.5 (GLOVE) ×6 IMPLANT
GOWN STRL REUS W/ TWL LRG LVL3 (GOWN DISPOSABLE) ×4 IMPLANT
GOWN STRL REUS W/ TWL XL LVL3 (GOWN DISPOSABLE) ×4 IMPLANT
GOWN STRL REUS W/TWL LRG LVL3 (GOWN DISPOSABLE) ×4
GOWN STRL REUS W/TWL XL LVL3 (GOWN DISPOSABLE) ×4
HANDPIECE INTERPULSE COAX TIP (DISPOSABLE) ×2
HEAD FEMORAL 32 CERAMIC (Hips) ×1 IMPLANT
KIT BASIN OR (CUSTOM PROCEDURE TRAY) ×3 IMPLANT
KIT TURNOVER KIT B (KITS) ×3 IMPLANT
LINER ACETABULAR 32X50 (Liner) ×1 IMPLANT
MANIFOLD NEPTUNE II (INSTRUMENTS) ×3 IMPLANT
NS IRRIG 1000ML POUR BTL (IV SOLUTION) ×3 IMPLANT
PACK TOTAL JOINT (CUSTOM PROCEDURE TRAY) ×3 IMPLANT
PAD ARMBOARD 7.5X6 YLW CONV (MISCELLANEOUS) ×3 IMPLANT
SET HNDPC FAN SPRY TIP SCT (DISPOSABLE) ×2 IMPLANT
STAPLER VISISTAT 35W (STAPLE) IMPLANT
STEM FEM ACTIS STD SZ2 (Stem) ×1 IMPLANT
STRIP CLOSURE SKIN 1/2X4 (GAUZE/BANDAGES/DRESSINGS) ×6 IMPLANT
SUT ETHIBOND NAB CT1 #1 30IN (SUTURE) ×3 IMPLANT
SUT MNCRL AB 4-0 PS2 18 (SUTURE) IMPLANT
SUT VIC AB 0 CT1 27 (SUTURE) ×2
SUT VIC AB 0 CT1 27XBRD ANBCTR (SUTURE) ×2 IMPLANT
SUT VIC AB 1 CT1 27 (SUTURE) ×2
SUT VIC AB 1 CT1 27XBRD ANBCTR (SUTURE) ×2 IMPLANT
SUT VIC AB 2-0 CT1 27 (SUTURE) ×2
SUT VIC AB 2-0 CT1 TAPERPNT 27 (SUTURE) ×2 IMPLANT
TOWEL GREEN STERILE (TOWEL DISPOSABLE) ×3 IMPLANT
TOWEL GREEN STERILE FF (TOWEL DISPOSABLE) ×3 IMPLANT
TRAY CATH 16FR W/PLASTIC CATH (SET/KITS/TRAYS/PACK) IMPLANT
TRAY FOLEY W/BAG SLVR 16FR (SET/KITS/TRAYS/PACK)
TRAY FOLEY W/BAG SLVR 16FR ST (SET/KITS/TRAYS/PACK) IMPLANT
WATER STERILE IRR 1000ML POUR (IV SOLUTION) ×6 IMPLANT

## 2022-02-12 NOTE — H&P (Signed)
TOTAL HIP ADMISSION H&P  Patient is admitted for right total hip arthroplasty.  Subjective:  Chief Complaint: right hip pain  HPI: Amber Bennett, 71 y.o. female, has a history of pain and functional disability in the right hip(s) due to arthritis and patient has failed non-surgical conservative treatments for greater than 12 weeks to include NSAID's and/or analgesics, corticosteriod injections, flexibility and strengthening excercises, weight reduction as appropriate, and activity modification.  Onset of symptoms was gradual starting 2 years ago with gradually worsening course since that time.The patient noted no past surgery on the right hip(s).  Patient currently rates pain in the right hip at 10 out of 10 with activity. Patient has night pain, worsening of pain with activity and weight bearing, pain that interfers with activities of daily living, and pain with passive range of motion. Patient has evidence of subchondral sclerosis, periarticular osteophytes, and joint space narrowing by imaging studies. This condition presents safety issues increasing the risk of falls.  There is no current active infection.  Patient Active Problem List   Diagnosis Date Noted   Unilateral primary osteoarthritis, right hip 11/21/2021   Bertolotti's syndrome    Convulsions/seizures ([place]) 12/01/2013   Other convulsions 06/02/2012   Unspecified essential hypertension 06/02/2012   Macular degeneration (senile) of retina, unspecified 06/02/2012   Dermatomyositis ([place]) 06/02/2012   Thoracic or lumbosacral neuritis or radiculitis, unspecified 06/02/2012   Other and unspecified hyperlipidemia 06/02/2012   Other drug allergy(995.27) 06/02/2012   Cervical root lesions, not elsewhere classified 06/02/2012   Past Medical History:  Diagnosis Date   Atherosclerosis of aorta (HCC)    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   Carpal tunnel syndrome    Cervical radiculitis    Depression    Dermatomyositis ([place])    Diabetes  mellitus without complication (HCC)    Elevated liver enzymes    External hemorrhoids    Fatty liver    Hematuria    negative evaluation   High cholesterol    HTN (hypertension)    Internal hemorrhoids    Lower back pain    Macular degeneration    Osteopenia    Left side hip   Seizures ([place])    09/07/2019 pt reports last seizure in 2014   Tubular adenoma of colon     Past Surgical History:  Procedure Laterality Date   ABDOMINAL HYSTERECTOMY  1982   APPENDECTOMY     BACK SURGERY     BACK SURGERY  07/2015   CARPAL TUNNEL RELEASE     CATARACT EXTRACTION, BILATERAL     CHOLECYSTECTOMY  2003   COLONOSCOPY     EXPLORATORY LAPAROTOMY     LAMINECTOMY     SHOULDER SURGERY Right 2000   TONSILLECTOMY      Current Facility-Administered Medications  Medication Dose Route Frequency Provider Last Rate Last Admin   ceFAZolin (ANCEF) IVPB 2g/100 mL premix  2 g Intravenous On Call to OR Pete Pelt, PA-C       chlorhexidine (PERIDEX) 0.12 % solution 15 mL  15 mL Mouth/Throat Once Hodierne, Quita Skye, MD       Or   Oral care mouth rinse  15 mL Mouth Rinse Once Hodierne, Adam, MD       lactated ringers infusion   Intravenous Continuous Hodierne, Adam, MD       povidone-iodine 10 % swab 2 Application  2 Application Topical Once Pete Pelt, PA-C       tranexamic acid (CYKLOKAPRON) IVPB 1,000 mg  1,000 mg Intravenous To OR Pete Pelt, PA-C       Allergies  Allergen Reactions   Tylenol [Acetaminophen] Swelling and Other (See Comments)    Throat swelling, anaphylaxis   Latex Itching    Rash     Social History   Tobacco Use   Smoking status: Former    Types: Cigarettes    Quit date: 07/01/1978    Years since quitting: 43.6   Smokeless tobacco: Never  Substance Use Topics   Alcohol use: Yes    Alcohol/week: 7.0 standard drinks of alcohol    Types: 7 Glasses of wine per week    Comment: Consumes wine with dinner/socially    Family History  Problem Relation Age of Onset    Cancer Mother        Cancer of the bone marrow   Heart attack Mother    Stroke Sister    Hypertension Sister    Diabetes Sister    Colon cancer Maternal Aunt    Seizures Neg Hx      Review of Systems  Musculoskeletal:  Positive for back pain and gait problem.  All other systems reviewed and are negative.   Objective:  Physical Exam Vitals reviewed.  Constitutional:      Appearance: Normal appearance.  HENT:     Head: Normocephalic and atraumatic.  Eyes:     Extraocular Movements: Extraocular movements intact.     Pupils: Pupils are equal, round, and reactive to light.  Cardiovascular:     Rate and Rhythm: Normal rate and regular rhythm.  Pulmonary:     Effort: Pulmonary effort is normal.     Breath sounds: Normal breath sounds.  Abdominal:     Palpations: Abdomen is soft.  Musculoskeletal:     Cervical back: Normal range of motion and neck supple.     Right hip: Tenderness and bony tenderness present. Decreased strength.  Neurological:     Mental Status: She is alert and oriented to person, place, and time.  Psychiatric:        Behavior: Behavior normal.     Vital signs in last 24 hours:    Labs:   Estimated body mass index is 34.07 kg/m as calculated from the following:   Height as of 02/05/22: '4\' 10"'$  (1.473 m).   Weight as of 02/05/22: 73.9 kg.   Imaging Review Plain radiographs demonstrate severe degenerative joint disease of the right hip(s). The bone quality appears to be excellent for age and reported activity level.      Assessment/Plan:  End stage arthritis, right hip(s)  The patient history, physical examination, clinical judgement of the provider and imaging studies are consistent with end stage degenerative joint disease of the right hip(s) and total hip arthroplasty is deemed medically necessary. The treatment options including medical management, injection therapy, arthroscopy and arthroplasty were discussed at length. The risks and benefits  of total hip arthroplasty were presented and reviewed. The risks due to aseptic loosening, infection, stiffness, dislocation/subluxation,  thromboembolic complications and other imponderables were discussed.  The patient acknowledged the explanation, agreed to proceed with the plan and consent was signed. Patient is being admitted for inpatient treatment for surgery, pain control, PT, OT, prophylactic antibiotics, VTE prophylaxis, progressive ambulation and ADL's and discharge planning.The patient is planning to be discharged home with home health services

## 2022-02-12 NOTE — Transfer of Care (Signed)
Immediate Anesthesia Transfer of Care Note  Patient: Amber Bennett  Procedure(s) Performed: RIGHT TOTAL HIP ARTHROPLASTY ANTERIOR APPROACH (Right: Hip)  Patient Location: PACU  Anesthesia Type:MAC and Spinal  Level of Consciousness: awake, drowsy, patient cooperative and responds to stimulation  Airway & Oxygen Therapy: Patient Spontanous Breathing and Patient connected to nasal cannula oxygen  Post-op Assessment: Report given to RN, airway patent, pt follows commands.  Post vital signs: Reviewed  Last Vitals:  Vitals Value Taken Time  BP    Temp    Pulse    Resp    SpO2      Last Pain:  Vitals:   02/12/22 0940  TempSrc:   PainSc: 0-No pain         Complications: No notable events documented.

## 2022-02-12 NOTE — Op Note (Signed)
Operative Note  Date of surgery: 02/12/2022 Preoperative diagnosis: Left hip osteoarthritis Postoperative diagnosis: Same  Procedure: Left direct anterior total hip arthroplasty  Implants: DePuy sector GRIPTION acetabular opponent size 50, size 32+0 neutral polythene liner, size 2 Actis femoral component with standard offset, size 32+1 ceramic hip ball  Surgeon: Lind Guest. Ninfa Linden, MD Assistant: Benita Stabile, PA-C  Anesthesia: Spinal EBL: 600 cc Antibiotics: 2 g IV Ancef Complications: None  Indications: The patient is a very active and pleasant 71 year old female with debilitating right hip pain.  After the failure of conservative treatment and plain films showing abnormal hip joint, a MRI of the right hip was obtained showing a small area of full-thickness cartilage loss at the weightbearing surface of the right hip.  An intra-articular steroid injection did help her quite a bit but it did wear off.  Her right hip pain is daily and it at this point it is detrimentally affecting her mobility, her quality of life, and her actives daily living to the point she does wish to proceed with a total hip arthroplasty and we agree with this.  We did discuss the risk of acute blood loss anemia, nerve or vessel injury, fracture, infection, DVT, dislocation, implant failure, leg length differences in skin and soft tissue issues.  We talked about her goals being hopefully decrease pain, improve mobility, and improve quality of life.  Procedure description: After informed consent was obtained and the appropriate right hip was marked, the patient was brought to the operating room and set up on her stretcher where spinal anesthesia was obtained.  She was then laid in a supine position on the stretcher and a Foley catheter was placed.  Traction boots were placed on both her feet.  She was then placed supine on the Hana fracture table with a perineal post in place and both legs in inline skeletal traction  devices but no traction applied.  The right operative hip was prepped and draped with DuraPrep and sterile drapes.  A timeout was called and she was identified as the correct patient and the correct right hip.  An incision was then made just inferior and posterior to the ASIS and carried slightly obliquely down the leg.  Dissection was then taken down to the tensor fascia lata muscle and the tensor fascia was divided longitudinally to proceed with direct anterior broach the hip.  Identified and cauterized circumflex vessels then identified the hip capsule above the hip capsule type format.  Cobra retractors were placed around the medial lateral femoral neck and a femoral neck cut was made with an oscillating saw just proximal to the lesser trochanter and completed with an osteotome.  The femoral head was removed in its entirety and there was an area of cartilage loss at the weightbearing surface of the femoral head.  A bent Hohmann was then placed over the medial acetabular rim and the labrum and other debris were removed.  We then began reaming under direct visitation from a size 43 reamer and stepwise increments going up to a size 49 reamer with all reamers placed under regularization and last reamer placed in direct fluoroscopy so we could obtain our depth reaming, the inclination and anteversion.  We then placed the real DePuy sector GRIPTION acetabular opponent size 50 and a 32+0 neutral polythene liner for that size acetabular component.  Attention was then turned to the femur.  With the leg externally rotated to 120 degrees as well as extended and adducted, a Mueller retractor was then  placed medially and a Hohmann retractor behind the greater trochanter.  The lateral joint capsule was released and a box cutting osteotome was used to enter the femoral canal.  We then began broaching using the Actis broaching system from a size 0 going to a size 2.  We then trialed a standard offset femoral neck and a 32+1  trial hip ball.  The leg was brought back over and up and with traction and internal rotation reduced in the pelvis.  On clinical exam we are pleased with stability and assisting the hip radiographically replaced with leg length and stability.  Note she started off just a touch short with her right side compared to the left side.  We then dislocated the hip and remove the trial components.  We placed the real Actis femoral component size 2 with standard offset and the real 32+1 ceramic hip ball and again reduced this into the acetabulum we are pleased with range of motion and stability as well as assessing mechanically and radiographically for offset and leg length.  The soft tissue was then irrigated with normal saline solution.  The joint capsule was closed with interrupted #1 Ethibond suture followed by #1 Vicryl suture to close the tensor fascia.  0 Vicryl was used to close the deep tissue and 2-0 Vicryl is used to close subcutaneous tissue.  Skin was closed with staples.  An Aquacel dressing was applied.  She was taken off the Hana table and taken to recovery room in stable condition with all final counts being correct and no complications noted.  Of note Benita Stabile, PA-C did assist during the entire case from opening and closing and was medically necessary for retracting soft tissues, helping guide implant placement and a layered closure of the wound.

## 2022-02-12 NOTE — Evaluation (Signed)
Physical Therapy Evaluation Patient Details Name: Amber Bennett MRN: [id number] DOB: 04/28/1951 Today's Date: 02/12/2022  History of Present Illness  Pt is 71 yo female s/p R anterior THA on 02/12/22.  Pt with hx including but not limited to Bertolotti's syndrome, depression, DM, HTN, low back pain, macular degeneration, osteopenia, back surgery  Clinical Impression  Pt is s/p THA resulting in the deficits listed below (see PT Problem List). At baseline, pt is ambulatory without AD.  She has support at home.  Pt has an older RW but it may be too tall and would benefit from youth size RW.  Today, pt was limited by pain/severe cramps with movement.  Cramps were relieved with hip flexion and relaxation.  She required min A for transfer to chair.  Expected to progress well as pain is managed. Pt does have a hx of back pain with spasms after an accident and has had back surgeries.  Pt will benefit from skilled PT to increase their independence and safety with mobility to allow discharge to the venue listed below.         Recommendations for follow up therapy are one component of a multi-disciplinary discharge planning process, led by the attending physician.  Recommendations may be updated based on patient status, additional functional criteria and insurance authorization.  Follow Up Recommendations Follow physician's recommendations for discharge plan and follow up therapies      Assistance Recommended at Discharge Frequent or constant Supervision/Assistance  Patient can return home with the following  A little help with walking and/or transfers;A little help with bathing/dressing/bathroom;Help with stairs or ramp for entrance;Assistance with cooking/housework    Equipment Recommendations Rolling walker (2 wheels) (Youth RW)  Recommendations for Other Services       Functional Status Assessment Patient has had a recent decline in their functional status and demonstrates the ability to make  significant improvements in function in a reasonable and predictable amount of time.     Precautions / Restrictions Precautions Precautions: Fall Restrictions Weight Bearing Restrictions: Yes RLE Weight Bearing: Weight bearing as tolerated      Mobility  Bed Mobility Overal bed mobility: Needs Assistance Bed Mobility: Supine to Sit     Supine to sit: Min assist, HOB elevated     General bed mobility comments: assist for R LE    Transfers Overall transfer level: Needs assistance Equipment used: Rolling walker (2 wheels) Transfers: Sit to/from Stand Sit to Stand: Min assist           General transfer comment: cues for hand placement and R LE managment    Ambulation/Gait Ambulation/Gait assistance: Min assist Gait Distance (Feet): 5 Feet Assistive device: Rolling walker (2 wheels) Gait Pattern/deviations: Step-to pattern, Decreased stride length Gait velocity: decreased     General Gait Details: Small steps to chair only; limited due to pain  Stairs            Wheelchair Mobility    Modified Rankin (Stroke Patients Only)       Balance Overall balance assessment: Needs assistance Sitting-balance support: No upper extremity supported Sitting balance-Leahy Scale: Good     Standing balance support: Bilateral upper extremity supported, Reliant on assistive device for balance Standing balance-Leahy Scale: Poor                               Pertinent Vitals/Pain Pain Assessment Pain Assessment: 0-10 Pain Score: 4  (except when cramped 10/10) Pain  Location: R hip Pain Descriptors / Indicators: Discomfort, Cramping Pain Intervention(s): Limited activity within patient's tolerance, Monitored during session, Premedicated before session, Repositioned, Ice applied, Utilized relaxation techniques (Pt's pain mostly 4/10, but had 3 episodes of cramping in groin that caused 10/10 pain, grimacing - improved with therapist flexing hip)    Home  Living Family/patient expects to be discharged to:: Private residence Living Arrangements: Spouse/significant other Available Help at Discharge: Family;Available 24 hours/day Type of Home: House Home Access: Stairs to enter Entrance Stairs-Rails: Left Entrance Stairs-Number of Steps: 4   Home Layout: Multi-level;Able to live on main level with bedroom/bathroom Home Equipment: Rolling Walker (2 wheels);Cane - single point;Grab bars - tub/shower Additional Comments: Has RW but it is too tall    Prior Function Prior Level of Function : Needs assist;Driving             Mobility Comments: Pt ambulated in community without AD ADLs Comments: Reports difficulty with socks but otherwise independent     Hand Dominance        Extremity/Trunk Assessment   Upper Extremity Assessment Upper Extremity Assessment: Overall WFL for tasks assessed    Lower Extremity Assessment Lower Extremity Assessment: RLE deficits/detail RLE Deficits / Details: Expected post op changes; ROM WFL; MMT: ankle 5/5, knee 3/5, hip 1/5    Cervical / Trunk Assessment Cervical / Trunk Assessment: Normal  Communication      Cognition Arousal/Alertness: Awake/alert Behavior During Therapy: WFL for tasks assessed/performed Overall Cognitive Status: Within Functional Limits for tasks assessed                                          General Comments General comments (skin integrity, edema, etc.): Pt with severe cramp with transfer to sitting from supine, again when going to sit, and when readjusting in chair.  Took time but relieved when therapist flexed hip and knee and pt took deep breaths.  Provided pt with gait belt hooked on R foot so she could self assist into flexion during cramp or just to assist moving leg    Exercises     Assessment/Plan    PT Assessment Patient needs continued PT services  PT Problem List Decreased strength;Decreased mobility;Decreased range of motion;Decreased  activity tolerance;Decreased balance;Decreased knowledge of use of DME;Pain       PT Treatment Interventions DME instruction;Therapeutic activities;Modalities;Gait training;Therapeutic exercise;Patient/family education;Stair training;Balance training;Functional mobility training;Manual techniques    PT Goals (Current goals can be found in the Care Plan section)  Acute Rehab PT Goals Patient Stated Goal: return home PT Goal Formulation: With patient/family Time For Goal Achievement: 02/26/22 Potential to Achieve Goals: Good    Frequency 7X/week     Co-evaluation               AM-PAC PT "6 Clicks" Mobility  Outcome Measure Help needed turning from your back to your side while in a flat bed without using bedrails?: A Little Help needed moving from lying on your back to sitting on the side of a flat bed without using bedrails?: A Little Help needed moving to and from a bed to a chair (including a wheelchair)?: A Little Help needed standing up from a chair using your arms (e.g., wheelchair or bedside chair)?: A Little Help needed to walk in hospital room?: Total Help needed climbing 3-5 steps with a railing? : Total 6 Click Score: 14  End of Session Equipment Utilized During Treatment: Gait belt Activity Tolerance: Patient limited by pain Patient left: in chair;with call bell/phone within reach Nurse Communication: Mobility status PT Visit Diagnosis: Other abnormalities of gait and mobility (R26.89);Pain Pain - Right/Left: Right Pain - part of body: Hip    Time: 7841-2820 PT Time Calculation (min) (ACUTE ONLY): 30 min   Charges:   PT Evaluation $PT Eval Low Complexity: 1 Low PT Treatments $Therapeutic Activity: 8-22 mins        Abran Richard, PT Acute Rehab [facility] [phone]   Karlton Lemon 02/12/2022, 5:56 PM

## 2022-02-12 NOTE — Discharge Instructions (Signed)

## 2022-02-13 ENCOUNTER — Encounter (HOSPITAL_COMMUNITY): Payer: Self-pay | Admitting: Orthopaedic Surgery

## 2022-02-13 ENCOUNTER — Ambulatory Visit: Payer: Medicare Other | Admitting: Adult Health

## 2022-02-13 DIAGNOSIS — Z87891 Personal history of nicotine dependence: Secondary | ICD-10-CM | POA: Diagnosis not present

## 2022-02-13 DIAGNOSIS — I10 Essential (primary) hypertension: Secondary | ICD-10-CM | POA: Diagnosis not present

## 2022-02-13 DIAGNOSIS — M1611 Unilateral primary osteoarthritis, right hip: Secondary | ICD-10-CM | POA: Diagnosis not present

## 2022-02-13 DIAGNOSIS — Z91040 Latex allergy status: Secondary | ICD-10-CM | POA: Diagnosis not present

## 2022-02-13 DIAGNOSIS — E119 Type 2 diabetes mellitus without complications: Secondary | ICD-10-CM | POA: Diagnosis not present

## 2022-02-13 LAB — GLUCOSE, CAPILLARY
Glucose-Capillary: 123 mg/dL — ABNORMAL HIGH (ref 70–99)
Glucose-Capillary: 150 mg/dL — ABNORMAL HIGH (ref 70–99)
Glucose-Capillary: 144 mg/dL — ABNORMAL HIGH (ref 70–99)

## 2022-02-13 LAB — CBC
HCT: 28.6 % — ABNORMAL LOW (ref 36.0–46.0)
Hemoglobin: 9.7 g/dL — ABNORMAL LOW (ref 12.0–15.0)
MCH: 34.2 pg — ABNORMAL HIGH (ref 26.0–34.0)
MCHC: 33.9 g/dL (ref 30.0–36.0)
MCV: 100.7 fL — ABNORMAL HIGH (ref 80.0–100.0)
Platelets: 133 10*3/uL — ABNORMAL LOW (ref 150–400)
RBC: 2.84 MIL/uL — ABNORMAL LOW (ref 3.87–5.11)
RDW: 12.2 % (ref 11.5–15.5)
WBC: 10.4 10*3/uL (ref 4.0–10.5)
nRBC: 0 % (ref 0.0–0.2)

## 2022-02-13 LAB — BASIC METABOLIC PANEL
Anion gap: 6 (ref 5–15)
BUN: 8 mg/dL (ref 8–23)
CO2: 23 mmol/L (ref 22–32)
Calcium: 8.5 mg/dL — ABNORMAL LOW (ref 8.9–10.3)
Chloride: 108 mmol/L (ref 98–111)
Creatinine, Ser: 0.54 mg/dL (ref 0.44–1.00)
GFR, Estimated: >60 mL/min (ref >60)
Glucose, Bld: 130 mg/dL — ABNORMAL HIGH (ref 70–99)
Potassium: 4.7 mmol/L (ref 3.5–5.1)
Sodium: 137 mmol/L (ref 135–145)

## 2022-02-13 MED ORDER — BUPIVACAINE IN DEXTROSE 0.75-8.25 % IT SOLN
INTRATHECAL | Status: DC | PRN
  Administered 2022-02-12: 1.8 mL via INTRATHECAL

## 2022-02-13 MED ORDER — TIZANIDINE HCL 4 MG PO TABS
4.0000 mg | ORAL_TABLET | Freq: Three times a day (TID) | ORAL | Status: DC | PRN
  Administered 2022-02-13 – 2022-02-14 (×3): 4 mg via ORAL
  Filled 2022-02-13 (×3): qty 1

## 2022-02-13 NOTE — Progress Notes (Signed)
Physical Therapy Treatment Patient Details Name: Amber Bennett MRN: [id number] DOB: Nov 22, 1950 Today's Date: 02/13/2022   History of Present Illness Pt is 71 yo female s/p R anterior THA on 02/12/22.  Pt with hx including but not limited to Bertolotti's syndrome, depression, DM, HTN, low back pain, macular degeneration, osteopenia, back surgery    PT Comments    Pt admitted with above diagnosis. Pt was able to ambulate with RW into hallway with min guard assist and min cues with RW with max encouragement due to continued spasm right groin. Pt is progressing slowly.  Pt expresses concern of going home with husband today. Discussed how PT didn't really help pt much and that PT would return at 12:30 pm for a second session.  Will follow acutely. Pt currently with functional limitations due to balance and endurance deficits. Pt will benefit from skilled PT to increase their independence and safety with mobility to allow discharge to the venue listed below.      Recommendations for follow up therapy are one component of a multi-disciplinary discharge planning process, led by the attending physician.  Recommendations may be updated based on patient status, additional functional criteria and insurance authorization.  Follow Up Recommendations  Follow physician's recommendations for discharge plan and follow up therapies     Assistance Recommended at Discharge Frequent or constant Supervision/Assistance  Patient can return home with the following A little help with walking and/or transfers;A little help with bathing/dressing/bathroom;Help with stairs or ramp for entrance;Assistance with cooking/housework   Equipment Recommendations  Rolling walker (2 wheels);BSC/3in1 (Youth RW)    Recommendations for Other Services       Precautions / Restrictions Precautions Precautions: Fall Restrictions Weight Bearing Restrictions: Yes RLE Weight Bearing: Weight bearing as tolerated     Mobility  Bed  Mobility               General bed mobility comments: Pt in chair on arrival    Transfers Overall transfer level: Needs assistance Equipment used: Rolling walker (2 wheels) Transfers: Sit to/from Stand Sit to Stand: Min assist, Min guard           General transfer comment: cues for hand placement but pt was able to stand on her own.  Did need a little assist for PT to hold LE with gait belt that pt loops around foot for her to scoot out to get ready to stand.    Ambulation/Gait Ambulation/Gait assistance: Min assist, Min guard Gait Distance (Feet): 80 Feet Assistive device: Rolling walker (2 wheels) Gait Pattern/deviations: Step-to pattern, Decreased stride length, Antalgic, Trunk flexed, Drifts right/left Gait velocity: decreased Gait velocity interpretation: <1.31 ft/sec, indicative of household ambulator   General Gait Details: Pt c/o initiailly that she cannot walk.  With encouragement, was able to get pt to stand and do some stretches with hip extension prior to walk.  Pt then was able to walk into hallway with slow but steady gait with RW with cues for upright posture as pt tends to flex forward.  Needed cues to stay inside RW as well.   Stairs             Wheelchair Mobility    Modified Rankin (Stroke Patients Only)       Balance Overall balance assessment: Needs assistance Sitting-balance support: No upper extremity supported Sitting balance-Leahy Scale: Good     Standing balance support: Bilateral upper extremity supported, Reliant on assistive device for balance Standing balance-Leahy Scale: Poor Standing balance comment: relies on  RW for UE support                            Cognition Arousal/Alertness: Awake/alert Behavior During Therapy: WFL for tasks assessed/performed Overall Cognitive Status: Within Functional Limits for tasks assessed                                          Exercises Total Joint  Exercises Ankle Circles/Pumps: AROM, Both, 10 reps, Supine Quad Sets: AROM, Both, 10 reps, Supine Gluteal Sets: AROM, Both, 10 reps, Supine Hip ABduction/ADduction: AROM, 5 reps, Standing, Right Long Arc Quad: AROM, Both, 10 reps, Seated Knee Flexion: AROM, Right, Standing (2 reps) Standing Hip Extension: AROM, Right, 5 reps, Standing    General Comments General comments (skin integrity, edema, etc.): Pt still with cramps and had muscle relaxers earlier.  Pt was able to work through the pain.      Pertinent Vitals/Pain Pain Assessment Pain Assessment: Faces Faces Pain Scale: Hurts whole lot Pain Location: R hip/groin spasms per pt Pain Descriptors / Indicators: Discomfort, Cramping, Spasm Pain Intervention(s): Limited activity within patient's tolerance, Monitored during session, Repositioned, Patient requesting pain meds-RN notified, Ice applied    Home Living                          Prior Function            PT Goals (current goals can now be found in the care plan section) Acute Rehab PT Goals Patient Stated Goal: return home Progress towards PT goals: Progressing toward goals    Frequency    7X/week      PT Plan Current plan remains appropriate    Co-evaluation              AM-PAC PT "6 Clicks" Mobility   Outcome Measure  Help needed turning from your back to your side while in a flat bed without using bedrails?: A Little Help needed moving from lying on your back to sitting on the side of a flat bed without using bedrails?: A Little Help needed moving to and from a bed to a chair (including a wheelchair)?: A Little Help needed standing up from a chair using your arms (e.g., wheelchair or bedside chair)?: A Little Help needed to walk in hospital room?: A Little Help needed climbing 3-5 steps with a railing? : A Lot 6 Click Score: 17    End of Session Equipment Utilized During Treatment: Gait belt Activity Tolerance: Patient limited by  pain Patient left: in chair;with call bell/phone within reach;with family/visitor present Nurse Communication: Mobility status;Patient requests pain meds PT Visit Diagnosis: Other abnormalities of gait and mobility (R26.89);Pain Pain - Right/Left: Right Pain - part of body: Hip     Time: 0822-0853 PT Time Calculation (min) (ACUTE ONLY): 31 min  Charges:  $Gait Training: 8-22 mins $Therapeutic Exercise: 8-22 mins                     [facility] M,PT Acute Rehab Services [place] 02/13/2022, 10:35 AM

## 2022-02-13 NOTE — Anesthesia Postprocedure Evaluation (Signed)
Anesthesia Post Note  Patient: Amber Bennett  Procedure(s) Performed: RIGHT TOTAL HIP ARTHROPLASTY ANTERIOR APPROACH (Right: Hip)     Patient location during evaluation: PACU Anesthesia Type: Spinal Level of consciousness: oriented and awake and alert Pain management: pain level controlled Vital Signs Assessment: post-procedure vital signs reviewed and stable Respiratory status: spontaneous breathing, respiratory function stable and patient connected to nasal cannula oxygen Cardiovascular status: blood pressure returned to baseline and stable Postop Assessment: no headache, no backache and no apparent nausea or vomiting Anesthetic complications: no   No notable events documented.  Last Vitals:  Vitals:   02/13/22 0434 02/13/22 0737  BP: (!) 121/53 (!) 126/46  Pulse: 88 90  Resp: 16 16  Temp: 37.1 C 37 C  SpO2: 92% 100%    Last Pain:  Vitals:   02/13/22 0737  TempSrc: Oral  PainSc:    Pain Goal: Patients Stated Pain Goal: 3 (02/13/22 0521)                 Haywood Lasso L Taylor Levick

## 2022-02-13 NOTE — Anesthesia Procedure Notes (Addendum)
Spinal  Patient location during procedure: OR Start time: 02/12/2022 11:16 AM End time: 02/12/2022 11:19 AM Reason for block: surgical anesthesia Staffing Performed: anesthesiologist  Anesthesiologist: Freddrick March, MD Performed by: Freddrick March, MD Authorized by: Freddrick March, MD   Preanesthetic Checklist Completed: patient identified, IV checked, risks and benefits discussed, surgical consent, monitors and equipment checked, pre-op evaluation and timeout performed Spinal Block Patient position: sitting Prep: DuraPrep and site prepped and draped Patient monitoring: cardiac monitor, continuous pulse ox and blood pressure Approach: midline Location: L3-4 Injection technique: single-shot Needle Needle type: Pencan  Needle gauge: 24 G Needle length: 9 cm Assessment Sensory level: T6 Events: CSF return Additional Notes Functioning IV was confirmed and monitors were applied. Sterile prep and drape, including hand hygiene and sterile gloves were used. The patient was positioned and the spine was prepped. The skin was anesthetized with lidocaine.  Free flow of clear CSF was obtained prior to injecting local anesthetic into the CSF.  The spinal needle aspirated freely following injection.  The needle was carefully withdrawn.  The patient tolerated the procedure well.

## 2022-02-13 NOTE — Progress Notes (Signed)
Subjective: 1 Day Post-Op Procedure(s) (LRB): RIGHT TOTAL HIP ARTHROPLASTY ANTERIOR APPROACH (Right) Patient reports pain as moderate.  Spasms last evening.  Acute blood loss anemia from surgery, but tolerating well.  Objective: Vital signs in last 24 hours: Temp:  [97.7 F (36.5 C)-98.8 F (37.1 C)] 98.6 F (37 C) (08/16 0737) Pulse Rate:  [77-92] 90 (08/16 0737) Resp:  [10-22] 16 (08/16 0737) BP: (111-144)/(46-77) 126/46 (08/16 0737) SpO2:  [91 %-100 %] 100 % (08/16 0737) Weight:  [73.9 kg] 73.9 kg (08/15 0931)  Intake/Output from previous day: 08/15 0701 - 08/16 0700 In: 3710 [P.O.:960; I.V.:2500; IV Piggyback:250] Out: 5427 [Urine:4150; Blood:700] Intake/Output this shift: No intake/output data recorded.  Recent Labs    02/13/22 0559  HGB 9.7*   Recent Labs    02/13/22 0559  WBC 10.4  RBC 2.84*  HCT 28.6*  PLT 133*   Recent Labs    02/13/22 0559  NA 137  K 4.7  CL 108  CO2 23  BUN 8  CREATININE 0.54  GLUCOSE 130*  CALCIUM 8.5*   No results for input(s): "LABPT", "INR" in the last 72 hours.  Sensation intact distally Intact pulses distally Dorsiflexion/Plantar flexion intact Incision: dressing C/D/I   Assessment/Plan: 1 Day Post-Op Procedure(s) (LRB): RIGHT TOTAL HIP ARTHROPLASTY ANTERIOR APPROACH (Right) Up with therapy      Mcarthur Rossetti 02/13/2022, 7:49 AM

## 2022-02-13 NOTE — Anesthesia Postprocedure Evaluation (Signed)
Anesthesia Post Note  Patient: Amber Bennett  Procedure(s) Performed: RIGHT TOTAL HIP ARTHROPLASTY ANTERIOR APPROACH (Right: Hip)     Patient location during evaluation: PACU Anesthesia Type: Spinal Level of consciousness: oriented and awake and alert Pain management: pain level controlled Vital Signs Assessment: post-procedure vital signs reviewed and stable Respiratory status: spontaneous breathing, respiratory function stable and patient connected to nasal cannula oxygen Cardiovascular status: blood pressure returned to baseline and stable Postop Assessment: no headache, no backache and no apparent nausea or vomiting Anesthetic complications: no   No notable events documented.  Last Vitals:  Vitals:   02/12/22 2334 02/13/22 0434  BP: (!) 135/48 (!) 121/53  Pulse: 92 88  Resp: 16 16  Temp: 36.9 C 37.1 C  SpO2: 99% 92%    Last Pain:  Vitals:   02/13/22 0547  TempSrc:   PainSc: 4    Pain Goal: Patients Stated Pain Goal: 3 (02/13/22 0521)                 Haywood Lasso L Kelsea Mousel

## 2022-02-13 NOTE — Care Plan (Signed)
Ortho Bundle Case Management Note  Patient Details  Name: Amber Bennett MRN: [id number] Date of Birth: 03/29/1951                  [facility] call to patient prior to her Right total hip arthroplasty with Dr. Ninfa Linden on 02/12/22. She is an Ortho bundle patient through [facility] and is agreeable to case management. She lives with her spouse, who will be assisting at home after discharge. She has a RW already but therapist indicated patient will need a Jr. Walker based on her height. She also has requested a 3in1/BSC. [facility] at hospital will provide. Anticipate HHPT will be needed after a short hospital stay. Referral made to [facility] after choice provided. Reviewed all post op care instructions. Will continue to follow for needs.    DME Arranged:  3-N-1 (Patient reports she'd like a 3in1/BSC; Also reports she has a RW, but therapy indicated it may be too tall for patient; will need a Brooke Bonito. walker) DME Agency:  [facility]  HH Arranged:  PT HH Agency:  [facility]  Additional Comments: Please contact me with any questions of if this plan should need to change.  Jamse Arn, RN, BSN, [employer]  [phone] 02/13/2022, 8:34 AM

## 2022-02-13 NOTE — Progress Notes (Signed)
02/13/22 1300  PT Visit Information  Last PT Received On 02/13/22  Assistance Needed +1  History of Present Illness Pt is 71 yo female s/p R anterior THA on 02/12/22.  Pt with hx including but not limited to Bertolotti's syndrome, depression, DM, HTN, low back pain, macular degeneration, osteopenia, back surgery  Subjective Data  Patient Stated Goal return home  Precautions  Precautions Fall  Restrictions  Weight Bearing Restrictions Yes  RLE Weight Bearing WBAT  Pain Assessment  Pain Assessment Faces  Faces Pain Scale 4  Pain Location R hip/groin spasms per pt  Pain Descriptors / Indicators Discomfort;Cramping;Spasm  Pain Intervention(s) Limited activity within patient's tolerance;Monitored during session;Premedicated before session;Repositioned;Ice applied  Cognition  Arousal/Alertness Awake/alert  Behavior During Therapy WFL for tasks assessed/performed  Overall Cognitive Status Within Functional Limits for tasks assessed  Bed Mobility  Overal bed mobility Needs Assistance  Bed Mobility Sit to Supine;Sit to Sidelying  Sit to supine Mod assist  Sit to sidelying Mod assist  General bed mobility comments Pt needed mod cues and mod assist to get back into bed. Pt had difficulty trying to get LEs back into bed as well as lying on side. Pt needs more practice.  Transfers  Overall transfer level Needs assistance  Equipment used Rolling walker (2 wheels)  Transfers Sit to/from Stand  Sit to Stand Min assist;Min guard  General transfer comment cues for hand placement but pt was able to stand on her own.  Did need a little assist for PT to hold LE with gait belt that pt loops around foot for her to scoot out to get ready to stand.  Ambulation/Gait  Ambulation/Gait assistance Min guard  Gait Distance (Feet) 175 Feet  Assistive device Rolling walker (2 wheels)  Gait Pattern/deviations Step-to pattern;Decreased stride length;Antalgic;Trunk flexed;Drifts right/left  General Gait Details  Pt was able to walk into hallway with slow but steady gait with RW with cues for upright posture as pt tends to flex forward.  Needed cues to stay inside RW as well.  Able to progress distance although she did complain she went a little too far.  Gait velocity decreased  Gait velocity interpretation <1.31 ft/sec, indicative of household ambulator  Stairs  (declined to practice today)  Balance  Overall balance assessment Needs assistance  Sitting-balance support No upper extremity supported  Sitting balance-Leahy Scale Good  Standing balance support Bilateral upper extremity supported;Reliant on assistive device for balance  Standing balance-Leahy Scale Poor  Standing balance comment relies on RW for UE support  PT - End of Session  Equipment Utilized During Treatment Gait belt  Activity Tolerance Patient limited by pain  Patient left with call bell/phone within reach;with family/visitor present;in bed  Nurse Communication Mobility status   PT - Assessment/Plan  PT Plan Current plan remains appropriate  PT Visit Diagnosis Other abnormalities of gait and mobility (R26.89);Pain  Pain - Right/Left Right  Pain - part of body Hip  PT Frequency (ACUTE ONLY) 7X/week  Follow Up Recommendations Follow physician's recommendations for discharge plan and follow up therapies  Assistance recommended at discharge Frequent or constant Supervision/Assistance  Patient can return home with the following A little help with walking and/or transfers;A little help with bathing/dressing/bathroom;Help with stairs or ramp for entrance;Assistance with cooking/housework  PT equipment Rolling walker (2 wheels);BSC/3in1 ([employer] RW)  AM-PAC PT "6 Clicks" Mobility Outcome Measure (Version 2)  Help needed turning from your back to your side while in a flat bed without using bedrails? 3  Help needed moving from lying on your back to sitting on the side of a flat bed without using bedrails? 3  Help needed moving to and  from a bed to a chair (including a wheelchair)? 3  Help needed standing up from a chair using your arms (e.g., wheelchair or bedside chair)? 3  Help needed to walk in hospital room? 3  Help needed climbing 3-5 steps with a railing?  2  6 Click Score 17  Consider Recommendation of Discharge To: Home with [facility]  Progressive Mobility  What is the highest level of mobility based on the progressive mobility assessment? Level 5 (Walks with assist in room/hall) - Balance while stepping forward/back and can walk in room with assist - Complete  Activity Ambulated with assistance in hallway  PT Goal Progression  Progress towards PT goals Progressing toward goals  PT Time Calculation  PT Start Time (ACUTE ONLY) 1238  PT Stop Time (ACUTE ONLY) 1302  PT Time Calculation (min) (ACUTE ONLY) 24 min  PT General Charges  $$ ACUTE PT VISIT 1 Visit  PT Treatments  $Gait Training 23-37 mins  Pt progressing with ambulation this pm.  Pt with improved pain from the am session however pt still states she feels that going home tomorrow am after another session would be helpful. Pt would benefit from more education regarding bed mobility, working on stair training and education with husband. Issued gait belt. Will continue to follow acutely.  Timi Reeser M,PT Acute Rehab Services [phone]

## 2022-02-14 DIAGNOSIS — Z87891 Personal history of nicotine dependence: Secondary | ICD-10-CM | POA: Diagnosis not present

## 2022-02-14 DIAGNOSIS — Z91040 Latex allergy status: Secondary | ICD-10-CM | POA: Diagnosis not present

## 2022-02-14 DIAGNOSIS — M1611 Unilateral primary osteoarthritis, right hip: Secondary | ICD-10-CM | POA: Diagnosis not present

## 2022-02-14 DIAGNOSIS — E119 Type 2 diabetes mellitus without complications: Secondary | ICD-10-CM | POA: Diagnosis not present

## 2022-02-14 DIAGNOSIS — I10 Essential (primary) hypertension: Secondary | ICD-10-CM | POA: Diagnosis not present

## 2022-02-14 LAB — GLUCOSE, CAPILLARY: Glucose-Capillary: 172 mg/dL — ABNORMAL HIGH (ref 70–99)

## 2022-02-14 MED ORDER — OXYCODONE HCL 5 MG PO TABS: 5.0000 mg | ORAL_TABLET | ORAL | 0 refills | Status: DC | PRN

## 2022-02-14 MED ORDER — TIZANIDINE HCL 4 MG PO TABS: 4.0000 mg | ORAL_TABLET | Freq: Three times a day (TID) | ORAL | 0 refills | Status: DC | PRN

## 2022-02-14 MED ORDER — ASPIRIN 81 MG PO CHEW: 81.0000 mg | CHEWABLE_TABLET | Freq: Two times a day (BID) | ORAL | 0 refills | Status: DC

## 2022-02-14 NOTE — Progress Notes (Signed)
Subjective: 2 Days Post-Op Procedure(s) (LRB): RIGHT TOTAL HIP ARTHROPLASTY ANTERIOR APPROACH (Right) Patient reports pain as moderate.    Objective: Vital signs in last 24 hours: Temp:  [98.1 F (36.7 C)-99.5 F (37.5 C)] 98.1 F (36.7 C) (08/17 0759) Pulse Rate:  [75-92] 79 (08/17 0800) Resp:  [16-20] 20 (08/17 0759) BP: (91-114)/(41-50) 111/49 (08/17 0800) SpO2:  [96 %-100 %] 100 % (08/17 0800)  Intake/Output from previous day: No intake/output data recorded. Intake/Output this shift: No intake/output data recorded.  Recent Labs    02/13/22 0559  HGB 9.7*   Recent Labs    02/13/22 0559  WBC 10.4  RBC 2.84*  HCT 28.6*  PLT 133*   Recent Labs    02/13/22 0559  NA 137  K 4.7  CL 108  CO2 23  BUN 8  CREATININE 0.54  GLUCOSE 130*  CALCIUM 8.5*   No results for input(s): "LABPT", "INR" in the last 72 hours.  Intact pulses distally Dorsiflexion/Plantar flexion intact Incision: dressing C/D/I   Assessment/Plan: 2 Days Post-Op Procedure(s) (LRB): RIGHT TOTAL HIP ARTHROPLASTY ANTERIOR APPROACH (Right) Up with therapy Discharge home with home health this afternoon.      Mcarthur Rossetti 02/14/2022, 9:10 AM

## 2022-02-14 NOTE — Plan of Care (Signed)
Pt and husband given D/C instructions with verbal understanding. Rx's were sent to the pharmacy by MD. Pt's incision is clean and dry with no sign of infection. Pt's IV was removed prior to D/C. Pt D/C'd home via wheelchair per MD order. Pt is stable @ D/C and has no other needs at this time. Home Health was arranged per MD order. Pt received RW and 3-n-1 from [employer] per MD order. Holli Humbles, RN

## 2022-02-14 NOTE — Progress Notes (Addendum)
Physical Therapy Treatment Patient Details Name: Amber Bennett MRN: [id number] DOB: 1950/07/02 Today's Date: 02/14/2022   History of Present Illness Pt is 71 y/o female s/p R anterior THA on 02/12/22.  Pt with hx including but not limited to Bertolotti's syndrome, depression, DM, HTN, low back pain, macular degeneration, osteopenia, back surgery    PT Comments    Pt progressing towards physical therapy goals. Was able to perform transfers and ambulation with gross min guard assist, and stair negotiation with mod assist. Pt was educated on HEP, car transfer, and general safety with mobility at home. Pt anticipates d/c home today. Will continue to follow.     Recommendations for follow up therapy are one component of a multi-disciplinary discharge planning process, led by the attending physician.  Recommendations may be updated based on patient status, additional functional criteria and insurance authorization.  Follow Up Recommendations  Follow physician's recommendations for discharge plan and follow up therapies     Assistance Recommended at Discharge Frequent or constant Supervision/Assistance  Patient can return home with the following A little help with walking and/or transfers;A little help with bathing/dressing/bathroom;Help with stairs or ramp for entrance;Assistance with cooking/housework   Equipment Recommendations  Rolling walker (2 wheels);BSC/3in1 (Youth RW)    Recommendations for Other Services       Precautions / Restrictions Precautions Precautions: Fall Precaution Comments: Cramping in R groin Restrictions Weight Bearing Restrictions: Yes RLE Weight Bearing: Weight bearing as tolerated     Mobility  Bed Mobility               General bed mobility comments: Pt was received sitting up in the recliner.    Transfers Overall transfer level: Needs assistance Equipment used: Rolling walker (2 wheels) Transfers: Sit to/from Stand Sit to Stand: Min guard            General transfer comment: Increased time. VC's for hand placement on seated surface for safety. No assist required but hands on guarding throughout.    Ambulation/Gait Ambulation/Gait assistance: Min guard Gait Distance (Feet): 100 Feet Assistive device: Rolling walker (2 wheels) Gait Pattern/deviations: Step-to pattern, Decreased stride length, Antalgic, Trunk flexed, Drifts right/left Gait velocity: decreased Gait velocity interpretation: <1.31 ft/sec, indicative of household ambulator   General Gait Details: Slow and guarded with RW for support.   Stairs Stairs: Yes Stairs assistance: Mod assist Stair Management: One rail Left, Forwards, Step to pattern Number of Stairs: 4 General stair comments: HHA on the R with railing use on the L. Mod assist for boost up and control to lower down to the next step.   Wheelchair Mobility    Modified Rankin (Stroke Patients Only)       Balance Overall balance assessment: Needs assistance Sitting-balance support: No upper extremity supported Sitting balance-Leahy Scale: Good     Standing balance support: Bilateral upper extremity supported, Reliant on assistive device for balance Standing balance-Leahy Scale: Poor Standing balance comment: relies on RW for UE support                            Cognition Arousal/Alertness: Awake/alert Behavior During Therapy: WFL for tasks assessed/performed Overall Cognitive Status: Within Functional Limits for tasks assessed                                          Exercises Total Joint Exercises  Ankle Circles/Pumps: 10 reps Quad Sets: 10 reps Short Arc Quad: 10 reps, AAROM Heel Slides: 10 reps, AAROM Hip ABduction/ADduction: 10 reps, AAROM Long Arc Quad: 10 reps    General Comments        Pertinent Vitals/Pain Pain Assessment Pain Assessment: Faces Faces Pain Scale: Hurts little more Pain Location: R hip/groin spasms per pt Pain  Descriptors / Indicators: Discomfort, Cramping, Spasm Pain Intervention(s): Limited activity within patient's tolerance, Monitored during session, Repositioned    Home Living                          Prior Function            PT Goals (current goals can now be found in the care plan section) Acute Rehab PT Goals Patient Stated Goal: Be able to move her R leg PT Goal Formulation: With patient/family Time For Goal Achievement: 02/26/22 Potential to Achieve Goals: Good Progress towards PT goals: Progressing toward goals    Frequency    7X/week      PT Plan Current plan remains appropriate    Co-evaluation              AM-PAC PT "6 Clicks" Mobility   Outcome Measure  Help needed turning from your back to your side while in a flat bed without using bedrails?: A Little Help needed moving from lying on your back to sitting on the side of a flat bed without using bedrails?: A Little Help needed moving to and from a bed to a chair (including a wheelchair)?: A Little Help needed standing up from a chair using your arms (e.g., wheelchair or bedside chair)?: A Little Help needed to walk in hospital room?: A Little Help needed climbing 3-5 steps with a railing? : A Lot 6 Click Score: 17    End of Session Equipment Utilized During Treatment: Gait belt Activity Tolerance: Patient limited by pain Patient left: with call bell/phone within reach;with family/visitor present;in bed Nurse Communication: Mobility status PT Visit Diagnosis: Other abnormalities of gait and mobility (R26.89);Pain Pain - Right/Left: Right Pain - part of body: Hip     Time: 1941-7408 PT Time Calculation (min) (ACUTE ONLY): 38 min  Charges:  $Gait Training: 23-37 mins $Therapeutic Exercise: 8-22 mins                     Amber Bennett, PT, DPT Acute Rehabilitation Services Secure Chat Preferred Office: [phone]    Amber Bennett 02/14/2022, 10:57 AM

## 2022-02-14 NOTE — Discharge Summary (Signed)
Patient ID: Amber Bennett MRN: [id number] DOB/AGE: [birth year] 73 y.o.  Admit date: 02/12/2022 Discharge date: 02/14/2022  Admission Diagnoses:  Principal Problem:   Unilateral primary osteoarthritis, right hip Active Problems:   Osteoarthritis of hip   Status post total replacement of right hip   Discharge Diagnoses:  Same  Past Medical History:  Diagnosis Date   Atherosclerosis of aorta ([place])    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   Carpal tunnel syndrome    Cervical radiculitis    Depression    Dermatomyositis ([place])    Diabetes mellitus without complication ([place])    Elevated liver enzymes    External hemorrhoids    Fatty liver    Hematuria    negative evaluation   High cholesterol    HTN (hypertension)    Internal hemorrhoids    Lower back pain    Macular degeneration    Osteopenia    Left side hip   Seizures ([place])    09/07/2019 pt reports last seizure in 2014   Tubular adenoma of colon     Surgeries: Procedure(s): RIGHT TOTAL HIP ARTHROPLASTY ANTERIOR APPROACH on 02/12/2022   Consultants:   Discharged Condition: Improved  Hospital Course: Amber Bennett is an 71 y.o. female who was admitted 02/12/2022 for operative treatment ofUnilateral primary osteoarthritis, right hip. Patient has severe unremitting pain that affects sleep, daily activities, and work/hobbies. After pre-op clearance the patient was taken to the operating room on 02/12/2022 and underwent  Procedure(s): RIGHT TOTAL HIP ARTHROPLASTY ANTERIOR APPROACH.    Patient was given perioperative antibiotics:  Anti-infectives (From admission, onward)    Start     Dose/Rate Route Frequency Ordered Stop   02/12/22 1800  ceFAZolin (ANCEF) IVPB 1 g/50 mL premix        1 g 100 mL/hr over 30 Minutes Intravenous Every 6 hours 02/12/22 1544 02/13/22 0006   02/12/22 0915  ceFAZolin (ANCEF) IVPB 2g/100 mL premix        2 g 200 mL/hr over 30 Minutes Intravenous On call to O.R. 02/12/22 0906 02/12/22 1115         Patient was given sequential compression devices, early ambulation, and chemoprophylaxis to prevent DVT.  Patient benefited maximally from hospital stay and there were no complications.    Recent vital signs: Patient Vitals for the past 24 hrs:  BP Temp Temp src Pulse Resp SpO2  02/14/22 0800 (!) 111/49 -- -- 79 -- 100 %  02/14/22 0759 (!) 100/41 98.1 F (36.7 C) Oral 79 20 100 %  02/14/22 0553 (!) 103/45 -- -- 75 -- --  02/14/22 0432 (!) 103/50 99.5 F (37.5 C) Oral 80 16 97 %  02/13/22 2345 (!) 91/46 98.3 F (36.8 C) Oral 80 16 96 %  02/13/22 1958 (!) 113/43 99.5 F (37.5 C) -- 92 18 100 %  02/13/22 1825 (!) 114/50 98.5 F (36.9 C) Oral 88 16 100 %  02/13/22 1455 (!) 91/47 98.4 F (36.9 C) Oral 79 16 97 %     Recent laboratory studies:  Recent Labs    02/13/22 0559  WBC 10.4  HGB 9.7*  HCT 28.6*  PLT 133*  NA 137  K 4.7  CL 108  CO2 23  BUN 8  CREATININE 0.54  GLUCOSE 130*  CALCIUM 8.5*     Discharge Medications:   Allergies as of 02/14/2022       Reactions   Tylenol [acetaminophen] Swelling, Other (See Comments)   Throat swelling, anaphylaxis  Latex Itching   Rash        Medication List     STOP taking these medications    VITAMIN B 12 PO       TAKE these medications    amLODipine 2.5 MG tablet Commonly known as: NORVASC Take 2.5 mg by mouth at bedtime.   aspirin 81 MG chewable tablet Chew 1 tablet (81 mg total) by mouth 2 (two) times daily.   BIOTIN PO Take 1 tablet by mouth daily.   CALTRATE 600+D3 PO Take 1 tablet by mouth daily.   CO Q 10 PO Take 300 mg by mouth daily.   diphenoxylate-atropine 2.5-0.025 MG tablet Commonly known as: Lomotil Take 1 tablet by mouth 4 (four) times daily as needed for diarrhea or loose stools.   DULoxetine 30 MG capsule Commonly known as: CYMBALTA Take 30 mg by mouth every evening.   ezetimibe 10 MG tablet Commonly known as: ZETIA Take 10 mg by mouth every evening.   fluticasone 50  MCG/ACT nasal spray Commonly known as: FLONASE Place 2 sprays into both nostrils daily as needed for allergies.   gabapentin 800 MG tablet Commonly known as: NEURONTIN Take 1 tablet (800 mg total) by mouth 2 (two) times daily.   loratadine 10 MG tablet Commonly known as: CLARITIN Take 10 mg by mouth daily.   montelukast 10 MG tablet Commonly known as: SINGULAIR Take 10 mg by mouth at bedtime.   MULTIVITAMIN PO Take 1 tablet by mouth daily.   omega-3 acid ethyl esters 1 g capsule Commonly known as: LOVAZA Take 2 g by mouth 2 (two) times daily.   omeprazole 20 MG capsule Commonly known as: PRILOSEC Take 20 mg by mouth every evening.   oxyCODONE 5 MG immediate release tablet Commonly known as: Oxy IR/ROXICODONE Take 1-2 tablets (5-10 mg total) by mouth every 4 (four) hours as needed for moderate pain (pain score 4-6).   Ozempic (1 MG/DOSE) 2 MG/1.5ML Sopn Generic drug: Semaglutide (1 MG/DOSE) Inject 1 mg into the skin every Friday.   PHENobarbital 97.2 MG tablet Commonly known as: LUMINAL Take 1.5 tablets (145.8 mg total) by mouth daily. What changed: when to take this   potassium chloride 20 MEQ packet Commonly known as: KLOR-CON Take 40 mEq by mouth daily.   Praluent 150 MG/ML Soaj Generic drug: Alirocumab Inject 150 mg into the skin every 14 (fourteen) days.   PRESERVISION AREDS PO Take 1 capsule by mouth daily.   PROBIOTIC PO Take 1 capsule by mouth daily.   pyridoxine 100 MG tablet Commonly known as: B-6 Take 100 mg by mouth daily.   sodium chloride 0.65 % Soln nasal spray Commonly known as: OCEAN Place 1 spray into both nostrils as needed for congestion.   telmisartan 40 MG tablet Commonly known as: MICARDIS Take 40 mg by mouth daily.   tiZANidine 4 MG tablet Commonly known as: ZANAFLEX Take 1 tablet (4 mg total) by mouth every 8 (eight) hours as needed for muscle spasms.   topiramate 50 MG tablet Commonly known as: TOPAMAX TAKE 1 TABLET BY  MOUTH EVERY MORNING AND 2 TABLETS EVERY EVENING   Vitamin D 50 MCG (2000 UT) tablet Take 2,000 Units by mouth daily.   vitamin E 180 MG (400 UNITS) capsule Take 400 Units by mouth daily.               Durable Medical Equipment  (From admission, onward)           Start  Ordered   02/12/22 1545  DME 3 n 1  Once        02/12/22 1544   02/12/22 1545  DME Walker rolling  Once       Question Answer Comment  Walker: With 5 Inch Wheels   Patient needs a walker to treat with the following condition Status post total replacement of right hip      02/12/22 1544            Diagnostic Studies: DG Pelvis Portable  Result Date: 02/12/2022 CLINICAL DATA:  Post hip replacement surgery EXAM: PORTABLE PELVIS 1-2 VIEWS COMPARISON:  11/01/2020 FINDINGS: Components of right hip arthroplasty project in expected location. No fracture or dislocation. Lateral skin staples and subcutaneous gas bubbles. Lumbosacral fixation hardware partially visualized. IMPRESSION: Right hip arthroplasty without apparent complication Electronically Signed   By: Lucrezia Europe M.D.   On: 02/12/2022 15:02   DG HIP UNILAT WITH PELVIS 1V RIGHT  Result Date: 02/12/2022 CLINICAL DATA:  Right anterior hip replacement EXAM: DG HIP (WITH OR WITHOUT PELVIS) 1V RIGHT; DG C-ARM 1-60 MIN-NO REPORT COMPARISON:  11/21/2021 FINDINGS: Right hip replacement in satisfactory position and alignment. No fracture or complication. IMPRESSION: Satisfactory right hip replacement Electronically Signed   By: Franchot Gallo M.D.   On: 02/12/2022 13:05   DG C-Arm 1-60 Min-No Report  Result Date: 02/12/2022 CLINICAL DATA:  Right anterior hip replacement EXAM: DG HIP (WITH OR WITHOUT PELVIS) 1V RIGHT; DG C-ARM 1-60 MIN-NO REPORT COMPARISON:  11/21/2021 FINDINGS: Right hip replacement in satisfactory position and alignment. No fracture or complication. IMPRESSION: Satisfactory right hip replacement Electronically Signed   By: Franchot Gallo  M.D.   On: 02/12/2022 13:05    Disposition: Discharge disposition: 01-Home or Self Care          Follow-up Information     Mcarthur Rossetti, MD. Go on 02/25/2022.   Specialty: Orthopedic Surgery Why: at 2:15 pm for your first post-operative appointment with Dr. Clarita Leber information: [place] [address] [phone]         [facility] Follow up.   Specialty: Home Health Services Why: The home health agency will contact you for the first home visit. Contact information: [address] [place] [address] [phone]                  Signed: Mcarthur Rossetti 02/14/2022, 2:09 PM

## 2022-02-15 ENCOUNTER — Telehealth: Payer: Self-pay | Admitting: *Deleted

## 2022-02-15 DIAGNOSIS — M3390 Dermatopolymyositis, unspecified, organ involvement unspecified: Secondary | ICD-10-CM | POA: Diagnosis not present

## 2022-02-15 DIAGNOSIS — I700 Atherosclerosis of aorta: Secondary | ICD-10-CM | POA: Diagnosis not present

## 2022-02-15 DIAGNOSIS — E7800 Pure hypercholesterolemia, unspecified: Secondary | ICD-10-CM | POA: Diagnosis not present

## 2022-02-15 DIAGNOSIS — Z87891 Personal history of nicotine dependence: Secondary | ICD-10-CM | POA: Diagnosis not present

## 2022-02-15 DIAGNOSIS — E119 Type 2 diabetes mellitus without complications: Secondary | ICD-10-CM | POA: Diagnosis not present

## 2022-02-15 DIAGNOSIS — M8580 Other specified disorders of bone density and structure, unspecified site: Secondary | ICD-10-CM | POA: Diagnosis not present

## 2022-02-15 DIAGNOSIS — G542 Cervical root disorders, not elsewhere classified: Secondary | ICD-10-CM | POA: Diagnosis not present

## 2022-02-15 DIAGNOSIS — Q7649 Other congenital malformations of spine, not associated with scoliosis: Secondary | ICD-10-CM | POA: Diagnosis not present

## 2022-02-15 DIAGNOSIS — H3530 Unspecified macular degeneration: Secondary | ICD-10-CM | POA: Diagnosis not present

## 2022-02-15 DIAGNOSIS — M5412 Radiculopathy, cervical region: Secondary | ICD-10-CM | POA: Diagnosis not present

## 2022-02-15 DIAGNOSIS — I10 Essential (primary) hypertension: Secondary | ICD-10-CM | POA: Diagnosis not present

## 2022-02-15 DIAGNOSIS — Z86010 Personal history of colonic polyps: Secondary | ICD-10-CM | POA: Diagnosis not present

## 2022-02-15 DIAGNOSIS — K760 Fatty (change of) liver, not elsewhere classified: Secondary | ICD-10-CM | POA: Diagnosis not present

## 2022-02-15 DIAGNOSIS — M545 Low back pain, unspecified: Secondary | ICD-10-CM | POA: Diagnosis not present

## 2022-02-15 DIAGNOSIS — Z7982 Long term (current) use of aspirin: Secondary | ICD-10-CM | POA: Diagnosis not present

## 2022-02-15 DIAGNOSIS — R569 Unspecified convulsions: Secondary | ICD-10-CM | POA: Diagnosis not present

## 2022-02-15 DIAGNOSIS — F32A Depression, unspecified: Secondary | ICD-10-CM | POA: Diagnosis not present

## 2022-02-15 DIAGNOSIS — Z96641 Presence of right artificial hip joint: Secondary | ICD-10-CM | POA: Diagnosis not present

## 2022-02-15 DIAGNOSIS — G5600 Carpal tunnel syndrome, unspecified upper limb: Secondary | ICD-10-CM | POA: Diagnosis not present

## 2022-02-15 DIAGNOSIS — Z471 Aftercare following joint replacement surgery: Secondary | ICD-10-CM | POA: Diagnosis not present

## 2022-02-15 DIAGNOSIS — K644 Residual hemorrhoidal skin tags: Secondary | ICD-10-CM | POA: Diagnosis not present

## 2022-02-15 NOTE — Telephone Encounter (Signed)
Spoke with patient today and she is very miserable with muscle spasming in the groin area, which has increased since being home and switching her to the Zanaflex. She asked what she can do for that. Her BP with HHPT today was 90/50. She has continued to hold her BP meds and she states she is pushing fluids. She is in her recliner and with legs elevated. Sciatica is causing her some issues today as well she states and is already on Gabapentin. Any other recommendations?

## 2022-02-18 ENCOUNTER — Ambulatory Visit (HOSPITAL_COMMUNITY)
Admission: RE | Admit: 2022-02-18 | Discharge: 2022-02-18 | Disposition: A | Discharge status: Home / Self Care | Payer: Medicare Other | Source: Ambulatory Visit | Attending: Internal Medicine | Admitting: Internal Medicine

## 2022-02-18 ENCOUNTER — Other Ambulatory Visit: Payer: Self-pay | Admitting: Internal Medicine

## 2022-02-18 ENCOUNTER — Other Ambulatory Visit (HOSPITAL_COMMUNITY): Payer: Self-pay | Admitting: Internal Medicine

## 2022-02-18 DIAGNOSIS — N390 Urinary tract infection, site not specified: Secondary | ICD-10-CM | POA: Diagnosis not present

## 2022-02-18 DIAGNOSIS — Z1152 Encounter for screening for COVID-19: Secondary | ICD-10-CM | POA: Diagnosis not present

## 2022-02-18 DIAGNOSIS — R051 Acute cough: Secondary | ICD-10-CM

## 2022-02-18 DIAGNOSIS — R0602 Shortness of breath: Secondary | ICD-10-CM

## 2022-02-18 DIAGNOSIS — D649 Anemia, unspecified: Secondary | ICD-10-CM | POA: Diagnosis not present

## 2022-02-18 DIAGNOSIS — R300 Dysuria: Secondary | ICD-10-CM | POA: Diagnosis not present

## 2022-02-18 DIAGNOSIS — Z96641 Presence of right artificial hip joint: Secondary | ICD-10-CM | POA: Diagnosis not present

## 2022-02-18 DIAGNOSIS — J189 Pneumonia, unspecified organism: Secondary | ICD-10-CM | POA: Diagnosis not present

## 2022-02-18 DIAGNOSIS — M2550 Pain in unspecified joint: Secondary | ICD-10-CM | POA: Diagnosis not present

## 2022-02-18 MED ORDER — IOHEXOL 350 MG/ML SOLN
75.0000 mL | Freq: Once | INTRAVENOUS | Status: AC | PRN
Start: 2022-02-18 — End: 2022-02-18
  Administered 2022-02-18: 75 mL via INTRAVENOUS

## 2022-02-19 DIAGNOSIS — M3390 Dermatopolymyositis, unspecified, organ involvement unspecified: Secondary | ICD-10-CM | POA: Diagnosis not present

## 2022-02-19 DIAGNOSIS — M5412 Radiculopathy, cervical region: Secondary | ICD-10-CM | POA: Diagnosis not present

## 2022-02-19 DIAGNOSIS — Z471 Aftercare following joint replacement surgery: Secondary | ICD-10-CM | POA: Diagnosis not present

## 2022-02-19 DIAGNOSIS — E119 Type 2 diabetes mellitus without complications: Secondary | ICD-10-CM | POA: Diagnosis not present

## 2022-02-19 DIAGNOSIS — I700 Atherosclerosis of aorta: Secondary | ICD-10-CM | POA: Diagnosis not present

## 2022-02-19 DIAGNOSIS — I10 Essential (primary) hypertension: Secondary | ICD-10-CM | POA: Diagnosis not present

## 2022-02-20 ENCOUNTER — Other Ambulatory Visit: Payer: Self-pay | Admitting: Orthopaedic Surgery

## 2022-02-20 ENCOUNTER — Telehealth: Payer: Self-pay | Admitting: Orthopaedic Surgery

## 2022-02-20 ENCOUNTER — Telehealth: Payer: Self-pay | Admitting: *Deleted

## 2022-02-20 MED ORDER — TIZANIDINE HCL 4 MG PO TABS: 4.0000 mg | ORAL_TABLET | Freq: Three times a day (TID) | ORAL | 0 refills | Status: DC | PRN

## 2022-02-20 MED ORDER — TIZANIDINE HCL 4 MG PO TABS: 4.0000 mg | ORAL_TABLET | Freq: Four times a day (QID) | ORAL | 0 refills | Status: DC | PRN

## 2022-02-20 MED ORDER — OXYCODONE HCL 5 MG PO TABS: 5.0000 mg | ORAL_TABLET | ORAL | 0 refills | Status: DC | PRN

## 2022-02-20 NOTE — Telephone Encounter (Signed)
Patient called and states that her pharmacy has informed her insurance won't refill her muscle relaxer, b/c it is ordered every 8 hours, but discussed with Dr. Ninfa Linden last week that she could take every 6- so she's used more. Can we re-order it to reflect that? Thanks.

## 2022-02-20 NOTE — Telephone Encounter (Signed)
Call to patient and spoke with her husband. Discussed medical issues since last conversation last week. She states she is feeling better overall. Working with HHPT. Requested refill of pain medication and muscle relaxer. Message already sent to MD. Will continue to follow for needs.

## 2022-02-20 NOTE — Telephone Encounter (Signed)
Pt needs refill on Zanaflex and oxycodone

## 2022-02-21 DIAGNOSIS — I10 Essential (primary) hypertension: Secondary | ICD-10-CM | POA: Diagnosis not present

## 2022-02-21 DIAGNOSIS — Z471 Aftercare following joint replacement surgery: Secondary | ICD-10-CM | POA: Diagnosis not present

## 2022-02-21 DIAGNOSIS — E119 Type 2 diabetes mellitus without complications: Secondary | ICD-10-CM | POA: Diagnosis not present

## 2022-02-21 DIAGNOSIS — M3390 Dermatopolymyositis, unspecified, organ involvement unspecified: Secondary | ICD-10-CM | POA: Diagnosis not present

## 2022-02-21 DIAGNOSIS — I700 Atherosclerosis of aorta: Secondary | ICD-10-CM | POA: Diagnosis not present

## 2022-02-21 DIAGNOSIS — M5412 Radiculopathy, cervical region: Secondary | ICD-10-CM | POA: Diagnosis not present

## 2022-02-21 NOTE — Telephone Encounter (Signed)
Pharmacy received and was able to run through insurance. Patient received.

## 2022-02-22 DIAGNOSIS — M3390 Dermatopolymyositis, unspecified, organ involvement unspecified: Secondary | ICD-10-CM | POA: Diagnosis not present

## 2022-02-22 DIAGNOSIS — I10 Essential (primary) hypertension: Secondary | ICD-10-CM | POA: Diagnosis not present

## 2022-02-22 DIAGNOSIS — E119 Type 2 diabetes mellitus without complications: Secondary | ICD-10-CM | POA: Diagnosis not present

## 2022-02-22 DIAGNOSIS — Z471 Aftercare following joint replacement surgery: Secondary | ICD-10-CM | POA: Diagnosis not present

## 2022-02-22 DIAGNOSIS — M5412 Radiculopathy, cervical region: Secondary | ICD-10-CM | POA: Diagnosis not present

## 2022-02-22 DIAGNOSIS — I700 Atherosclerosis of aorta: Secondary | ICD-10-CM | POA: Diagnosis not present

## 2022-02-25 ENCOUNTER — Ambulatory Visit (INDEPENDENT_AMBULATORY_CARE_PROVIDER_SITE_OTHER): Payer: Medicare Other

## 2022-02-25 ENCOUNTER — Telehealth: Payer: Self-pay | Admitting: *Deleted

## 2022-02-25 ENCOUNTER — Encounter: Payer: Self-pay | Admitting: Orthopaedic Surgery

## 2022-02-25 ENCOUNTER — Ambulatory Visit (INDEPENDENT_AMBULATORY_CARE_PROVIDER_SITE_OTHER): Payer: Medicare Other | Admitting: Orthopaedic Surgery

## 2022-02-25 DIAGNOSIS — Z96641 Presence of right artificial hip joint: Secondary | ICD-10-CM | POA: Diagnosis not present

## 2022-02-25 NOTE — Progress Notes (Signed)
Amber Bennett will be 2 weeks tomorrow status post a right total hip arthroplasty.  She has had a tough postoperative course in terms of spasm in the groin area.  I wanted to obtain x-rays today given the pain she is having.  She is walking with a walker and she is standing upright.  She has had some nosebleeds recently so aspirin had to be stopped.  She has had a recent chest CT scan to rule out a PE and this was negative for PE.  She has had some slight breathing issues.  They felt like there was potentially a mild pneumonia and she was treated with Levaquin.  Her right hip incision looks good.  The staples were removed and Steri-Strips applied.  There is just a small area in the groin crease of the wound that we see often and this can be treated with just Bactroban and a Band-Aid daily.  An AP pelvis and lateral of the right hip standing shows a well-seated total hip arthroplasty on the right side with no complicating features of the hardware at all.  On examination she does tolerate me putting her right hip through motion.  Her right knee is bruised to be expected after the surgery but no effusion.  Her right calf is soft.  She has negative Homans' sign.  Her motor and sensory exam in the right foot is normal.  She has slight foot and ankle swelling to be expected.  She has been compliant with wearing a compressive hose and I told her to still wear those.  I would like to see her back in just 2 weeks to make sure she is mobilizing well.  I let her know that this pain that she is experiencing is normal with a lot of patients and hopefully will subside with time and I gave her a lot of reassurance.  It does not appear that I need to obtain an ultrasound of her right lower extremity given her calf being soft

## 2022-02-25 NOTE — Telephone Encounter (Signed)
Ortho bundle 14 day in office meeting completed. °

## 2022-02-26 DIAGNOSIS — Z471 Aftercare following joint replacement surgery: Secondary | ICD-10-CM | POA: Diagnosis not present

## 2022-02-26 DIAGNOSIS — I700 Atherosclerosis of aorta: Secondary | ICD-10-CM | POA: Diagnosis not present

## 2022-02-26 DIAGNOSIS — I10 Essential (primary) hypertension: Secondary | ICD-10-CM | POA: Diagnosis not present

## 2022-02-26 DIAGNOSIS — M3390 Dermatopolymyositis, unspecified, organ involvement unspecified: Secondary | ICD-10-CM | POA: Diagnosis not present

## 2022-02-26 DIAGNOSIS — M5412 Radiculopathy, cervical region: Secondary | ICD-10-CM | POA: Diagnosis not present

## 2022-02-26 DIAGNOSIS — E119 Type 2 diabetes mellitus without complications: Secondary | ICD-10-CM | POA: Diagnosis not present

## 2022-02-28 DIAGNOSIS — Z471 Aftercare following joint replacement surgery: Secondary | ICD-10-CM | POA: Diagnosis not present

## 2022-02-28 DIAGNOSIS — I10 Essential (primary) hypertension: Secondary | ICD-10-CM | POA: Diagnosis not present

## 2022-02-28 DIAGNOSIS — M5412 Radiculopathy, cervical region: Secondary | ICD-10-CM | POA: Diagnosis not present

## 2022-02-28 DIAGNOSIS — E119 Type 2 diabetes mellitus without complications: Secondary | ICD-10-CM | POA: Diagnosis not present

## 2022-02-28 DIAGNOSIS — I700 Atherosclerosis of aorta: Secondary | ICD-10-CM | POA: Diagnosis not present

## 2022-02-28 DIAGNOSIS — M3390 Dermatopolymyositis, unspecified, organ involvement unspecified: Secondary | ICD-10-CM | POA: Diagnosis not present

## 2022-03-05 ENCOUNTER — Telehealth: Payer: Self-pay | Admitting: Orthopaedic Surgery

## 2022-03-05 ENCOUNTER — Other Ambulatory Visit: Payer: Self-pay | Admitting: Orthopaedic Surgery

## 2022-03-05 DIAGNOSIS — E119 Type 2 diabetes mellitus without complications: Secondary | ICD-10-CM | POA: Diagnosis not present

## 2022-03-05 DIAGNOSIS — M5412 Radiculopathy, cervical region: Secondary | ICD-10-CM | POA: Diagnosis not present

## 2022-03-05 DIAGNOSIS — Z471 Aftercare following joint replacement surgery: Secondary | ICD-10-CM | POA: Diagnosis not present

## 2022-03-05 DIAGNOSIS — R040 Epistaxis: Secondary | ICD-10-CM | POA: Diagnosis not present

## 2022-03-05 DIAGNOSIS — I10 Essential (primary) hypertension: Secondary | ICD-10-CM | POA: Diagnosis not present

## 2022-03-05 DIAGNOSIS — D649 Anemia, unspecified: Secondary | ICD-10-CM | POA: Diagnosis not present

## 2022-03-05 DIAGNOSIS — I700 Atherosclerosis of aorta: Secondary | ICD-10-CM | POA: Diagnosis not present

## 2022-03-05 DIAGNOSIS — M3390 Dermatopolymyositis, unspecified, organ involvement unspecified: Secondary | ICD-10-CM | POA: Diagnosis not present

## 2022-03-05 MED ORDER — TIZANIDINE HCL 4 MG PO TABS
4.0000 mg | ORAL_TABLET | Freq: Four times a day (QID) | ORAL | 0 refills | Status: DC | PRN
Start: 2022-03-05 — End: 2022-03-18

## 2022-03-05 NOTE — Telephone Encounter (Signed)
Please advise 

## 2022-03-05 NOTE — Telephone Encounter (Signed)
Pt called requesting a refill of muscle relaxer. Please send to pharmacy on file. Pt phone number is [phone].

## 2022-03-06 DIAGNOSIS — R040 Epistaxis: Secondary | ICD-10-CM | POA: Diagnosis not present

## 2022-03-06 DIAGNOSIS — J329 Chronic sinusitis, unspecified: Secondary | ICD-10-CM | POA: Diagnosis not present

## 2022-03-06 DIAGNOSIS — R490 Dysphonia: Secondary | ICD-10-CM | POA: Diagnosis not present

## 2022-03-06 DIAGNOSIS — R498 Other voice and resonance disorders: Secondary | ICD-10-CM | POA: Diagnosis not present

## 2022-03-06 DIAGNOSIS — J3489 Other specified disorders of nose and nasal sinuses: Secondary | ICD-10-CM | POA: Diagnosis not present

## 2022-03-08 DIAGNOSIS — Z471 Aftercare following joint replacement surgery: Secondary | ICD-10-CM | POA: Diagnosis not present

## 2022-03-08 DIAGNOSIS — M5412 Radiculopathy, cervical region: Secondary | ICD-10-CM | POA: Diagnosis not present

## 2022-03-08 DIAGNOSIS — M3390 Dermatopolymyositis, unspecified, organ involvement unspecified: Secondary | ICD-10-CM | POA: Diagnosis not present

## 2022-03-08 DIAGNOSIS — E119 Type 2 diabetes mellitus without complications: Secondary | ICD-10-CM | POA: Diagnosis not present

## 2022-03-08 DIAGNOSIS — I10 Essential (primary) hypertension: Secondary | ICD-10-CM | POA: Diagnosis not present

## 2022-03-08 DIAGNOSIS — I700 Atherosclerosis of aorta: Secondary | ICD-10-CM | POA: Diagnosis not present

## 2022-03-13 ENCOUNTER — Telehealth: Payer: Self-pay | Admitting: *Deleted

## 2022-03-13 NOTE — Telephone Encounter (Signed)
30 day call from patient; states she is really having a hard time with numbness around the incision that is going into the groin, but at night this "wakes up" and is prickly and really causing pain. Can't even stand the sheet over her it is so sensitive. She is already taking Gabapentin 800 mg bid and occasional Oxycodone, which does help some, but doesn't like how it makes her feel. I explained some numbness is normal with nerves 'waking up' ...could feel worse before getting better. She asked about increase in the muscle relaxer or anything different she could try?

## 2022-03-18 ENCOUNTER — Encounter: Payer: Self-pay | Admitting: Orthopaedic Surgery

## 2022-03-18 ENCOUNTER — Ambulatory Visit (INDEPENDENT_AMBULATORY_CARE_PROVIDER_SITE_OTHER): Payer: Medicare Other | Admitting: Orthopaedic Surgery

## 2022-03-18 ENCOUNTER — Other Ambulatory Visit: Payer: Self-pay | Admitting: Orthopaedic Surgery

## 2022-03-18 ENCOUNTER — Telehealth: Payer: Self-pay | Admitting: *Deleted

## 2022-03-18 DIAGNOSIS — Z96641 Presence of right artificial hip joint: Secondary | ICD-10-CM

## 2022-03-18 MED ORDER — TIZANIDINE HCL 4 MG PO TABS
4.0000 mg | ORAL_TABLET | Freq: Four times a day (QID) | ORAL | 0 refills | Status: DC | PRN
Start: 2022-03-18 — End: 2022-04-17

## 2022-03-18 NOTE — Progress Notes (Signed)
The patient continues to follow-up just over a month status post a right total hip arthroplasty.  She is finally making some progress with her mobility and decreasing some of her pain and spasms.  She does have numbness around her incision and I told her that is to be expected and will hopefully lessen with time.  I do see that her mobility is better.  On exam her right hip incision looks good.  Her calf is soft and there is no foot and ankle swelling.  She will continue to slowly increase her activities as comfort allows.  I am fine with her going to the beach in a week but needs to really go slow and be careful as she mobilizes.  We will see her back in 4 weeks to see how she is doing overall but no x-rays are needed.

## 2022-03-18 NOTE — Telephone Encounter (Signed)
Patient seen in office today. She forgot to ask about refill of muscle relaxer before leaving this weekend on vacation. Thank you.

## 2022-03-21 DIAGNOSIS — N390 Urinary tract infection, site not specified: Secondary | ICD-10-CM | POA: Diagnosis not present

## 2022-04-04 ENCOUNTER — Other Ambulatory Visit: Payer: Self-pay | Admitting: Adult Health

## 2022-04-17 ENCOUNTER — Encounter: Payer: Self-pay | Admitting: Orthopaedic Surgery

## 2022-04-17 ENCOUNTER — Ambulatory Visit (INDEPENDENT_AMBULATORY_CARE_PROVIDER_SITE_OTHER): Payer: Medicare Other | Admitting: Orthopaedic Surgery

## 2022-04-17 DIAGNOSIS — Z96641 Presence of right artificial hip joint: Secondary | ICD-10-CM

## 2022-04-17 MED ORDER — TIZANIDINE HCL 4 MG PO TABS: 4.0000 mg | ORAL_TABLET | Freq: Four times a day (QID) | ORAL | 0 refills | Status: DC | PRN

## 2022-04-17 NOTE — Progress Notes (Signed)
The patient is now 6 weeks status post a right total hip arthroplasty.  She still has some numbness and burning at her incision which is normal.  She does ambulate the cane but mainly not using it at home.  She still cannot lift her leg well so she has not driven yet.  She is 71 years old.  Her right operative hip moves much smoother than it did before surgery and even immediately postoperative.  She is making good progress with her gait and her balance.  She is requesting a refill on her muscle relaxant I think this is reasonable.  I will send some in.  From my standpoint the next time I need to see her is 3 months.  We will have a standing low AP pelvis and lateral of her right hip at that visit.

## 2022-04-22 DIAGNOSIS — K219 Gastro-esophageal reflux disease without esophagitis: Secondary | ICD-10-CM | POA: Diagnosis not present

## 2022-04-22 DIAGNOSIS — R945 Abnormal results of liver function studies: Secondary | ICD-10-CM | POA: Diagnosis not present

## 2022-04-22 DIAGNOSIS — E876 Hypokalemia: Secondary | ICD-10-CM | POA: Diagnosis not present

## 2022-04-22 DIAGNOSIS — I700 Atherosclerosis of aorta: Secondary | ICD-10-CM | POA: Diagnosis not present

## 2022-04-22 DIAGNOSIS — M2550 Pain in unspecified joint: Secondary | ICD-10-CM | POA: Diagnosis not present

## 2022-04-22 DIAGNOSIS — R7301 Impaired fasting glucose: Secondary | ICD-10-CM | POA: Diagnosis not present

## 2022-04-22 DIAGNOSIS — J189 Pneumonia, unspecified organism: Secondary | ICD-10-CM | POA: Diagnosis not present

## 2022-04-22 DIAGNOSIS — E785 Hyperlipidemia, unspecified: Secondary | ICD-10-CM | POA: Diagnosis not present

## 2022-04-22 DIAGNOSIS — Z23 Encounter for immunization: Secondary | ICD-10-CM | POA: Diagnosis not present

## 2022-04-22 DIAGNOSIS — Z96641 Presence of right artificial hip joint: Secondary | ICD-10-CM | POA: Diagnosis not present

## 2022-04-22 DIAGNOSIS — I10 Essential (primary) hypertension: Secondary | ICD-10-CM | POA: Diagnosis not present

## 2022-04-22 DIAGNOSIS — K760 Fatty (change of) liver, not elsewhere classified: Secondary | ICD-10-CM | POA: Diagnosis not present

## 2022-04-22 DIAGNOSIS — I440 Atrioventricular block, first degree: Secondary | ICD-10-CM | POA: Diagnosis not present

## 2022-04-29 DIAGNOSIS — Z23 Encounter for immunization: Secondary | ICD-10-CM | POA: Diagnosis not present

## 2022-05-15 ENCOUNTER — Telehealth: Payer: Self-pay | Admitting: Adult Health

## 2022-05-15 NOTE — Telephone Encounter (Signed)
A year's worth of refills was sent to this [facility] on 12/20/21.

## 2022-05-15 NOTE — Telephone Encounter (Signed)
Pt is requesting a refill for gabapentin (NEURONTIN) 800 MG tablet .  Pharmacy: [facility] [phone]

## 2022-05-16 DIAGNOSIS — J385 Laryngeal spasm: Secondary | ICD-10-CM | POA: Diagnosis not present

## 2022-05-16 NOTE — Telephone Encounter (Addendum)
I called [facility], was on hold over 10 minutes and the phone line ended up disconnecting. I called the pt and LVM advising [facility] has an Rx from June and I couldn't reach them. Asked her to call again and ask for the June prescription.   Sent pt a Therapist, music. Also faxed [facility] a copy of the prescription asking them to fill it. Received a receipt of confirmation.

## 2022-06-11 ENCOUNTER — Telehealth: Payer: Self-pay | Admitting: Internal Medicine

## 2022-06-11 NOTE — Telephone Encounter (Signed)
Inbound call from patient stating that she needs a refill for Lomotil. Please advise.

## 2022-06-13 NOTE — Telephone Encounter (Signed)
Patient was scheduled for 1/11 at 3:00 with Amy Esterwood and is requesting prescription be sent.

## 2022-06-14 ENCOUNTER — Other Ambulatory Visit: Payer: Self-pay

## 2022-06-14 MED ORDER — DIPHENOXYLATE-ATROPINE 2.5-0.025 MG PO TABS: 1.0000 | ORAL_TABLET | Freq: Four times a day (QID) | ORAL | 0 refills | Status: DC | PRN

## 2022-06-14 NOTE — Telephone Encounter (Signed)
Refill of Lomotil has been sent to patient pharmacy.

## 2022-06-17 ENCOUNTER — Other Ambulatory Visit: Payer: Self-pay

## 2022-06-17 ENCOUNTER — Telehealth: Payer: Self-pay | Admitting: Orthopaedic Surgery

## 2022-06-17 MED ORDER — TIZANIDINE HCL 4 MG PO TABS: 4.0000 mg | ORAL_TABLET | Freq: Four times a day (QID) | ORAL | 0 refills | Status: DC | PRN

## 2022-06-17 NOTE — Telephone Encounter (Signed)
Pt called in requesting refill on medication (tiZANidine (ZANAFLEX) 4 MG tablet)... Pt requesting callback

## 2022-06-20 DIAGNOSIS — J385 Laryngeal spasm: Secondary | ICD-10-CM | POA: Diagnosis not present

## 2022-06-25 DIAGNOSIS — R633 Feeding difficulties, unspecified: Secondary | ICD-10-CM | POA: Diagnosis not present

## 2022-06-25 DIAGNOSIS — R1310 Dysphagia, unspecified: Secondary | ICD-10-CM | POA: Diagnosis not present

## 2022-06-25 DIAGNOSIS — R1312 Dysphagia, oropharyngeal phase: Secondary | ICD-10-CM | POA: Diagnosis not present

## 2022-07-08 DIAGNOSIS — R945 Abnormal results of liver function studies: Secondary | ICD-10-CM | POA: Diagnosis not present

## 2022-07-08 DIAGNOSIS — G5602 Carpal tunnel syndrome, left upper limb: Secondary | ICD-10-CM | POA: Diagnosis not present

## 2022-07-08 DIAGNOSIS — E785 Hyperlipidemia, unspecified: Secondary | ICD-10-CM | POA: Diagnosis not present

## 2022-07-08 DIAGNOSIS — D649 Anemia, unspecified: Secondary | ICD-10-CM | POA: Diagnosis not present

## 2022-07-08 DIAGNOSIS — I10 Essential (primary) hypertension: Secondary | ICD-10-CM | POA: Diagnosis not present

## 2022-07-10 DIAGNOSIS — R1310 Dysphagia, unspecified: Secondary | ICD-10-CM | POA: Diagnosis not present

## 2022-07-10 DIAGNOSIS — R498 Other voice and resonance disorders: Secondary | ICD-10-CM | POA: Diagnosis not present

## 2022-07-10 DIAGNOSIS — R490 Dysphonia: Secondary | ICD-10-CM | POA: Diagnosis not present

## 2022-07-10 DIAGNOSIS — J385 Laryngeal spasm: Secondary | ICD-10-CM | POA: Diagnosis not present

## 2022-07-11 ENCOUNTER — Encounter: Payer: Self-pay | Admitting: Physician Assistant

## 2022-07-11 ENCOUNTER — Ambulatory Visit (INDEPENDENT_AMBULATORY_CARE_PROVIDER_SITE_OTHER): Payer: Medicare Other | Admitting: Physician Assistant

## 2022-07-11 VITALS — BP 124/80 | HR 65 | Ht <= 58 in | Wt 160.5 lb

## 2022-07-11 DIAGNOSIS — R933 Abnormal findings on diagnostic imaging of other parts of digestive tract: Secondary | ICD-10-CM

## 2022-07-11 DIAGNOSIS — R1310 Dysphagia, unspecified: Secondary | ICD-10-CM | POA: Diagnosis not present

## 2022-07-11 DIAGNOSIS — Z86010 Personal history of colonic polyps: Secondary | ICD-10-CM

## 2022-07-11 DIAGNOSIS — K529 Noninfective gastroenteritis and colitis, unspecified: Secondary | ICD-10-CM | POA: Diagnosis not present

## 2022-07-11 MED ORDER — DIPHENOXYLATE-ATROPINE 2.5-0.025 MG PO TABS: 1.0000 | ORAL_TABLET | Freq: Four times a day (QID) | ORAL | 0 refills | Status: DC | PRN

## 2022-07-11 MED ORDER — HYOSCYAMINE SULFATE 0.125 MG SL SUBL
0.1250 mg | SUBLINGUAL_TABLET | Freq: Four times a day (QID) | SUBLINGUAL | 6 refills | Status: DC | PRN
Start: 2022-07-11 — End: 2023-08-08

## 2022-07-11 NOTE — Progress Notes (Signed)
Subjective:    Patient ID: Amber Bennett, female    DOB: [birth year], 72 y.o.   MRN: [id number]  HPI Amber Bennett is a pleasant 72 year old white female, established with Dr. Hilarie Fredrickson, last seen in the office in June 2021.  She has history of GERD, hepatic steatosis, diarrhea and history of internal hemorrhoids for which she had undergone previous hemorrhoidal banding.  Also with history of adenomatous colon polyps.  Last colonoscopy March 2021 with 2 diminutive polyps, largest 3 mm removed.  One was adenomatous, and 1 hyperplastic polyp.  She had random biopsies done at that time which were negative for microscopic colitis.  She comes in today for follow-up and refill of medications, and in addition has complaint of solid food dysphagia.  Has been using Lomotil on an as-needed basis over the past few years.  She says she generally only takes this if she knows that she is going out to eat or will be eating something that tends to exacerbate diarrhea.  Usually at home she does not take anything but says she continues to have sporadic episodes of urgency for diarrhea within 15 to 30 minutes postprandially.  Seems to have aggravation of symptoms with certain foods like fried foods etc. but other times is not clear what triggers the symptoms.  She says generally they eat very healthily. She had undergone hip replacement about 6 months ago.  She says immediately after surgery she developed fairly severe symptoms with laryngitis, then cough.  She did not require intubation for this procedure.  She had COVID testing done that was negative and then was diagnosed with pneumonia.  She says ever since that time she has had significant issues with production of phlegm can be copious at times.  She says she has a lot of coughing and production of phlegm at nighttime sometimes she is using as many as 6 tissues during the night.  She is not aware of any acid reflux type symptoms sour brash etc. at nighttime.  She does take  omeprazole 20 mg p.o. daily.  She does not feel that she has any reflux symptoms during the daytime no complaints of odynophagia. She has been having some issues with solid food dysphagia over the past 6 months as well.  This does not occur daily but has been occurring frequently usually with foods like veggie burgers etc. that will break into pieces.  No difficulty with liquids.  She has been cutting her food into very small pieces and says that has definitely helped.  She had been evaluated by ENT recently, Dr. Dema Severin at [facility], and brought a copy today with her of recent speech path swallow eval and barium swallow. With the barium swallow noted to have multilevel degenerative changes of the cervical spine, no penetration or aspiration with thin liquids to solids.  With the barium tablet incidentally noted to have minor stagnation at the aortic knob. Speech path eval with impression of functional oropharyngeal swallowing issues across consistencies no delay in initiation,, no significant deficits in tongue retraction or pharyngeal constriction or laryngeal penetration or aspiration noted.  On AP view patient with brief retention of 13 mm tablet in the distal esophagus.  Review of Systems Pertinent positive and negative review of systems were noted in the above HPI section.  All other review of systems was otherwise negative.   Outpatient Encounter Medications as of 07/11/2022  Medication Sig   amLODipine (NORVASC) 2.5 MG tablet Take 2.5 mg by mouth at bedtime.  BIOTIN PO Take 1 tablet by mouth daily.   Calcium Carb-Cholecalciferol (CALTRATE 600+D3 PO) Take 1 tablet by mouth daily.   Cholecalciferol (VITAMIN D) 50 MCG (2000 UT) tablet Take 2,000 Units by mouth daily.   Coenzyme Q10 (CO Q 10 PO) Take 300 mg by mouth daily.   DULoxetine (CYMBALTA) 30 MG capsule Take 30 mg by mouth every evening.   ezetimibe (ZETIA) 10 MG tablet Take 10 mg by mouth every evening.   gabapentin (NEURONTIN) 800 MG  tablet Take 1 tablet (800 mg total) by mouth 2 (two) times daily.   hyoscyamine (LEVSIN SL) 0.125 MG SL tablet Place 1 tablet (0.125 mg total) under the tongue every 6 (six) hours as needed.   loratadine (CLARITIN) 10 MG tablet Take 10 mg by mouth daily.   montelukast (SINGULAIR) 10 MG tablet Take 10 mg by mouth at bedtime.   Multiple Vitamins-Minerals (MULTIVITAMIN PO) Take 1 tablet by mouth daily.   Multiple Vitamins-Minerals (PRESERVISION AREDS PO) Take 1 capsule by mouth daily.   omega-3 acid ethyl esters (LOVAZA) 1 G capsule Take 2 g by mouth 2 (two) times daily.   omeprazole (PRILOSEC) 20 MG capsule Take 20 mg by mouth every evening.   oxyCODONE (OXY IR/ROXICODONE) 5 MG immediate release tablet Take 1-2 tablets (5-10 mg total) by mouth every 4 (four) hours as needed for moderate pain (pain score 4-6).   OZEMPIC 1 MG/DOSE SOPN Inject 1 mg into the skin every Friday.   PHENobarbital (LUMINAL) 97.2 MG tablet TAKE 1 AND 1/2 TABLETS BY MOUTH EVERY DAY   potassium chloride (KLOR-CON) 20 MEQ packet Take 40 mEq by mouth daily.   PRALUENT 150 MG/ML SOAJ Inject 150 mg into the skin every 14 (fourteen) days.   Probiotic Product (PROBIOTIC PO) Take 1 capsule by mouth daily.   pyridoxine (B-6) 100 MG tablet Take 100 mg by mouth daily.   sodium chloride (OCEAN) 0.65 % SOLN nasal spray Place 1 spray into both nostrils as needed for congestion.   telmisartan (MICARDIS) 40 MG tablet Take 40 mg by mouth daily.    tiZANidine (ZANAFLEX) 4 MG tablet Take 1 tablet (4 mg total) by mouth every 6 (six) hours as needed for muscle spasms.   topiramate (TOPAMAX) 50 MG tablet TAKE 1 TABLET BY MOUTH EVERY MORNING AND 2 TABLETS EVERY EVENING   vitamin E 400 UNIT capsule Take 400 Units by mouth daily.   [DISCONTINUED] diphenoxylate-atropine (LOMOTIL) 2.5-0.025 MG tablet Take 1 tablet by mouth 4 (four) times daily as needed for diarrhea or loose stools.   aspirin 81 MG chewable tablet Chew 1 tablet (81 mg total) by mouth  2 (two) times daily.   diphenoxylate-atropine (LOMOTIL) 2.5-0.025 MG tablet Take 1 tablet by mouth 4 (four) times daily as needed for diarrhea or loose stools.   fluticasone (FLONASE) 50 MCG/ACT nasal spray Place 2 sprays into both nostrils daily as needed for allergies.   No facility-administered encounter medications on file as of 07/11/2022.   Allergies  Allergen Reactions   Tylenol [Acetaminophen] Swelling and Other (See Comments)    Throat swelling, anaphylaxis   Latex Itching    Rash    Patient Active Problem List   Diagnosis Date Noted   Osteoarthritis of hip 02/12/2022   Status post total replacement of right hip 02/12/2022   Unilateral primary osteoarthritis, right hip 11/21/2021   Bertolotti's syndrome    Convulsions/seizures ([place]) 12/01/2013   Other convulsions 06/02/2012   Unspecified essential hypertension 06/02/2012   Macular degeneration (  senile) of retina, unspecified 06/02/2012   Dermatomyositis ([place]) 06/02/2012   Thoracic or lumbosacral neuritis or radiculitis, unspecified 06/02/2012   Other and unspecified hyperlipidemia 06/02/2012   Other drug allergy(995.27) 06/02/2012   Cervical root lesions, not elsewhere classified 06/02/2012   Social History   Socioeconomic History   Marital status: Married    Spouse name: Not on file   Number of children: 2   Years of education: 14   Highest education level: Not on file  Occupational History   Occupation: Employed    Comment: Works for [employer], [employer] and [employer]  Tobacco Use   Smoking status: Former    Types: Cigarettes    Quit date: 07/01/1978    Years since quitting: 44.0   Smokeless tobacco: Never  Vaping Use   Vaping Use: Never used  Substance and Sexual Activity   Alcohol use: Yes    Alcohol/week: 7.0 standard drinks of alcohol    Types: 7 Glasses of wine per week    Comment: Consumes wine with dinner/socially   Drug use: No   Sexual activity: Yes    Partners: Male    Birth control/protection:  Surgical    Comment: TAH  Other Topics Concern   Not on file  Social History Narrative   Patient lives at home with her husband Zada Finders).   Retired.   Education two years of business college.   Right handed.   Caffeine     Social Determinants of Health   Financial Resource Strain: Not on file  Food Insecurity: Not on file  Transportation Needs: Not on file  Physical Activity: Not on file  Stress: Not on file  Social Connections: Not on file  Intimate Partner Violence: Not on file    Ms. Hunt's family history includes Cancer in her mother; Colon cancer in her maternal aunt; Diabetes in her sister; Heart attack in her mother; Hypertension in her sister; Stroke in her sister.      Objective:    Vitals:   07/11/22 1506  BP: 124/80  Pulse: 65    Physical Exam Well-developed well-nourished elderly WF in no acute distress.  Height, Weight,160  BMI 33.5 accompanied by husband  HEENT; nontraumatic normocephalic, EOMI, PE R LA, sclera anicteric. Oropharynx; not examined today Neck; supple, no JVD Cardiovascular; regular rate and rhythm with S1-S2, no murmur rub or gallop Pulmonary; Clear bilaterally Abdomen; soft, nontender, nondistended, no palpable mass or hepatosplenomegaly, bowel sounds are active Rectal; not done today Skin; benign exam, no jaundice rash or appreciable lesions Extremities; no clubbing cyanosis or edema skin warm and dry Neuro/Psych; alert and oriented x4, grossly nonfocal mood and affect appropriate        Assessment & Plan:   #64 72 year old white female with history of chronic diarrhea which at this time is intermittent/sporadic and likely IBS related.  Previous colonoscopy with random biopsies 2021 negative for microscopic colitis. She is status postcholecystectomy but says her current symptoms did not start for years after the cholecystectomy. She is on Topamax but has been on long-term.  Has done well with as needed use of Lomotil primarily used  when going out to eat etc.  She reports sporadic episodes of urgency postprandially and diarrhea which occur at home and can be unpredictable.  #2 history of adenomatous and hyperplastic polyps-up-to-date with colonoscopy last done March 2021 indicated for 7-year interval follow-up #3 GERD-has been maintained on low-dose omeprazole 20 mg daily  #4 new issue with intermittent solid food dysphagia onset fairly immediately  after she had undergone hip replacement about 6 months ago.  She did not require intubation for this procedure, developed symptoms of laryngitis immediately postoperatively followed by cough and was eventually diagnosed with pneumonia.  That was treated but ever since then has had persistent issues with production of phlegm, frequent coughing at nighttime waking her up with sputum production, and has also had solid food dysphagia intermittently.  She has been evaluated by ENT with no evidence of persistent laryngeal inflammation. Recent speech path eval and barium swallow portion as outlined above consistent with functional issues, no penetration or aspiration and the barium swallow did have transient lodging in the distal esophagus.  Could not rule out stricture  I am not certain that her nighttime symptoms and ongoing sputum production are associated with reflux.  This may be a separate issue   #5 seizure disorder stable #6.  Hypertension #7.  Osteoarthritis  Plan; refill Lomotil x 1 year for as needed use, uses generally only when going out to eat. We also discussed a trial of Levsin sublingual, dissolve 1 on tongue every 4-6 hours as needed for urgency/diarrhea to be taken at onset of symptoms.  For now we will continue omeprazole 20 mg p.o. every morning. Patient will be scheduled for EGD with possible esophageal dilation with Dr. Hilarie Fredrickson.  Procedure was discussed in detail with the patient including indications risk and benefits and she is agreeable to proceed.  Advised  her to start over-the-counter Mucinex twice daily which is beneficial at least for thinning secretions which may improve her symptoms.    Ramon Brant S Demetrious Rainford PA-C 07/11/2022   Cc: Crist Infante, MD

## 2022-07-11 NOTE — Patient Instructions (Addendum)
We have sent the following medications to your pharmacy for you to pick up at your convenience: Levsin SL - dissolve on tongue every 6 hours as needed for abdominal cramping/diarrhea. Lomotil 1 tablet daily as needed for diarrhea.  Continue Omeprazole 20 mg daily.   Mucinex OTC twice daily   _______________________________________________________  If you are age 74 or older, your body mass index should be between 23-30. Your Body mass index is 33.54 kg/m. If this is out of the aforementioned range listed, please consider follow up with your Primary Care Provider.  If you are age 17 or younger, your body mass index should be between 19-25. Your Body mass index is 33.54 kg/m. If this is out of the aformentioned range listed, please consider follow up with your Primary Care Provider.   ________________________________________________________  The [facility] providers would like to encourage you to use [facility] to communicate with providers for non-urgent requests or questions.  Due to long hold times on the telephone, sending your provider a message by [facility] may be a faster and more efficient way to get a response.  Please allow 48 business hours for a response.  Please remember that this is for non-urgent requests.  _______________________________________________________

## 2022-07-11 NOTE — Progress Notes (Signed)
Addendum: Reviewed and agree with assessment and management plan. Davidlee Jeanbaptiste M, MD  

## 2022-07-15 DIAGNOSIS — E876 Hypokalemia: Secondary | ICD-10-CM | POA: Diagnosis not present

## 2022-07-15 DIAGNOSIS — L812 Freckles: Secondary | ICD-10-CM | POA: Diagnosis not present

## 2022-07-15 DIAGNOSIS — D1801 Hemangioma of skin and subcutaneous tissue: Secondary | ICD-10-CM | POA: Diagnosis not present

## 2022-07-15 DIAGNOSIS — K760 Fatty (change of) liver, not elsewhere classified: Secondary | ICD-10-CM | POA: Diagnosis not present

## 2022-07-15 DIAGNOSIS — R972 Elevated prostate specific antigen [PSA]: Secondary | ICD-10-CM | POA: Diagnosis not present

## 2022-07-15 DIAGNOSIS — D225 Melanocytic nevi of trunk: Secondary | ICD-10-CM | POA: Diagnosis not present

## 2022-07-15 DIAGNOSIS — R945 Abnormal results of liver function studies: Secondary | ICD-10-CM | POA: Diagnosis not present

## 2022-07-15 DIAGNOSIS — L649 Androgenic alopecia, unspecified: Secondary | ICD-10-CM | POA: Diagnosis not present

## 2022-07-15 DIAGNOSIS — D485 Neoplasm of uncertain behavior of skin: Secondary | ICD-10-CM | POA: Diagnosis not present

## 2022-07-15 DIAGNOSIS — L821 Other seborrheic keratosis: Secondary | ICD-10-CM | POA: Diagnosis not present

## 2022-07-15 DIAGNOSIS — E785 Hyperlipidemia, unspecified: Secondary | ICD-10-CM | POA: Diagnosis not present

## 2022-07-17 ENCOUNTER — Telehealth: Payer: Self-pay | Admitting: *Deleted

## 2022-07-17 ENCOUNTER — Ambulatory Visit (INDEPENDENT_AMBULATORY_CARE_PROVIDER_SITE_OTHER): Payer: Medicare Other

## 2022-07-17 ENCOUNTER — Ambulatory Visit (INDEPENDENT_AMBULATORY_CARE_PROVIDER_SITE_OTHER): Payer: Medicare Other | Admitting: Orthopaedic Surgery

## 2022-07-17 ENCOUNTER — Encounter: Payer: Self-pay | Admitting: Orthopaedic Surgery

## 2022-07-17 DIAGNOSIS — Z96641 Presence of right artificial hip joint: Secondary | ICD-10-CM | POA: Diagnosis not present

## 2022-07-17 MED ORDER — TIZANIDINE HCL 4 MG PO TABS: 4.0000 mg | ORAL_TABLET | Freq: Three times a day (TID) | ORAL | 0 refills | Status: DC | PRN

## 2022-07-17 NOTE — Progress Notes (Signed)
The patient is now about 5 months out from a right total hip arthroplasty.  She is an active 72 year old female.  She has been doing well overall but still needs a muscle relaxer on occasion and I agree with this.  She still has some pain in the groin area but up just superior to the top aspect of her incision.  On my exam it does feel like there is some scar tissue in this area but I do not feel any irregularities of the bone or the implant itself.  Her right hip moves smoothly and fluidly.  She occasionally feels a catch but on my exam today I do not elicit any type of response that she is at.  She does have appropriate pain over the trochanteric area and some numbness and tingling that is getting better she reports.  She is walking without assistive device and has a more normal-appearing gait.  An AP pelvis and lateral of her right hip shows a well-seated total hip arthroplasty with no complicating features.  I gave her reassurance that she is having symptoms that a lot of people have after joint replacement surgery in the early going.  This will hopefully improve as time goes by.  With that being said, I would like to see her back in 6 months with a final AP pelvis and lateral of the right hip.  If things worsen before then or if she has any issues she knows to let us know.

## 2022-07-17 NOTE — Telephone Encounter (Signed)
Ortho bundle CM completed.

## 2022-07-18 DIAGNOSIS — E669 Obesity, unspecified: Secondary | ICD-10-CM | POA: Diagnosis not present

## 2022-07-18 DIAGNOSIS — M2559 Pain in other specified joint: Secondary | ICD-10-CM | POA: Diagnosis not present

## 2022-07-18 DIAGNOSIS — M7989 Other specified soft tissue disorders: Secondary | ICD-10-CM | POA: Diagnosis not present

## 2022-07-18 DIAGNOSIS — M25511 Pain in right shoulder: Secondary | ICD-10-CM | POA: Diagnosis not present

## 2022-07-18 DIAGNOSIS — Z6834 Body mass index (BMI) 34.0-34.9, adult: Secondary | ICD-10-CM | POA: Diagnosis not present

## 2022-07-18 DIAGNOSIS — M3390 Dermatopolymyositis, unspecified, organ involvement unspecified: Secondary | ICD-10-CM | POA: Diagnosis not present

## 2022-07-30 DIAGNOSIS — M2559 Pain in other specified joint: Secondary | ICD-10-CM | POA: Diagnosis not present

## 2022-07-30 DIAGNOSIS — M797 Fibromyalgia: Secondary | ICD-10-CM | POA: Diagnosis not present

## 2022-07-30 DIAGNOSIS — M3390 Dermatopolymyositis, unspecified, organ involvement unspecified: Secondary | ICD-10-CM | POA: Diagnosis not present

## 2022-07-30 DIAGNOSIS — M25511 Pain in right shoulder: Secondary | ICD-10-CM | POA: Diagnosis not present

## 2022-07-30 DIAGNOSIS — Z6833 Body mass index (BMI) 33.0-33.9, adult: Secondary | ICD-10-CM | POA: Diagnosis not present

## 2022-07-30 DIAGNOSIS — E669 Obesity, unspecified: Secondary | ICD-10-CM | POA: Diagnosis not present

## 2022-08-01 DIAGNOSIS — M797 Fibromyalgia: Secondary | ICD-10-CM

## 2022-08-01 HISTORY — DX: Fibromyalgia: M79.7

## 2022-08-19 ENCOUNTER — Encounter: Payer: Self-pay | Admitting: Internal Medicine

## 2022-08-19 ENCOUNTER — Ambulatory Visit (AMBULATORY_SURGERY_CENTER): Payer: Medicare Other | Admitting: Internal Medicine

## 2022-08-19 VITALS — BP 163/89 | HR 92 | Temp 98.0°F | Resp 22 | Ht <= 58 in | Wt 160.0 lb

## 2022-08-19 DIAGNOSIS — R1310 Dysphagia, unspecified: Secondary | ICD-10-CM | POA: Diagnosis not present

## 2022-08-19 DIAGNOSIS — I10 Essential (primary) hypertension: Secondary | ICD-10-CM | POA: Diagnosis not present

## 2022-08-19 DIAGNOSIS — K219 Gastro-esophageal reflux disease without esophagitis: Secondary | ICD-10-CM | POA: Diagnosis not present

## 2022-08-19 DIAGNOSIS — K222 Esophageal obstruction: Secondary | ICD-10-CM

## 2022-08-19 DIAGNOSIS — F32A Depression, unspecified: Secondary | ICD-10-CM | POA: Diagnosis not present

## 2022-08-19 DIAGNOSIS — E119 Type 2 diabetes mellitus without complications: Secondary | ICD-10-CM | POA: Diagnosis not present

## 2022-08-19 MED ORDER — SODIUM CHLORIDE 0.9 % IV SOLN: 500.0000 mL | Freq: Once | INTRAVENOUS | Status: DC

## 2022-08-19 NOTE — Progress Notes (Signed)
Report to pacu rn. Patient placed on nasal canula O2 due to intra-procedure o2 desaturation. Pt mentating appropriately and maintaining spo2 in pacu. Vss. Care resumed by rn.

## 2022-08-19 NOTE — Progress Notes (Signed)
GASTROENTEROLOGY PROCEDURE H&P NOTE   Primary Care Physician: Crist Infante, MD    Reason for Procedure:  Longstanding GERD, recent dysphagia  Plan:    Upper endoscopy  Patient is appropriate for endoscopic procedure(s) in the ambulatory ([facility]) setting.  The nature of the procedure, as well as the risks, benefits, and alternatives were carefully and thoroughly reviewed with the patient. Ample time for discussion and questions allowed. The patient understood, was satisfied, and agreed to proceed.     HPI: Amber Bennett is a 72 y.o. female who presents for upper endoscopy with possible dilation.  Medical history as below.  No recent chest pain or shortness of breath.  No abdominal pain today.  Past Medical History:  Diagnosis Date   Atherosclerosis of aorta (HCC)    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   Carpal tunnel syndrome    Cervical radiculitis    Depression    Dermatomyositis ([place])    Diabetes mellitus without complication (HCC)    Elevated liver enzymes    External hemorrhoids    Fatty liver    Hematuria    negative evaluation   High cholesterol    HTN (hypertension)    Internal hemorrhoids    Laryngitis    Lower back pain    Macular degeneration    Osteopenia    Left side hip   Seizures ([place])    09/07/2019 pt reports last seizure in 2014   Tubular adenoma of colon     Past Surgical History:  Procedure Laterality Date   [place]  07/2015   CARPAL TUNNEL RELEASE     CATARACT EXTRACTION, BILATERAL     CHOLECYSTECTOMY  2003   COLONOSCOPY     EXPLORATORY LAPAROTOMY     LAMINECTOMY     SHOULDER SURGERY Right 2000   TONSILLECTOMY     TOTAL HIP ARTHROPLASTY Right 02/12/2022   Procedure: RIGHT TOTAL HIP ARTHROPLASTY ANTERIOR APPROACH;  Surgeon: Mcarthur Rossetti, MD;  Location: [facility];  Service: Orthopedics;  Laterality: Right;    Prior to Admission medications   Medication Sig Start  Date End Date Taking? Authorizing Provider  amLODipine (NORVASC) 2.5 MG tablet Take 2.5 mg by mouth at bedtime.    [provider]  aspirin 81 MG chewable tablet Chew 1 tablet (81 mg total) by mouth 2 (two) times daily. 02/14/22   Mcarthur Rossetti, MD  BIOTIN PO Take 1 tablet by mouth daily.    [provider]  Calcium Carb-Cholecalciferol (CALTRATE 600+D3 PO) Take 1 tablet by mouth daily.    [provider]  Cholecalciferol (VITAMIN D) 50 MCG (2000 UT) tablet Take 2,000 Units by mouth daily.    [provider]  Coenzyme Q10 (CO Q 10 PO) Take 300 mg by mouth daily.    [provider]  diphenoxylate-atropine (LOMOTIL) 2.5-0.025 MG tablet Take 1 tablet by mouth 4 (four) times daily as needed for diarrhea or loose stools. 07/11/22   Esterwood, Amy S, PA-C  DULoxetine (CYMBALTA) 30 MG capsule Take 30 mg by mouth every evening. 12/28/15   [provider]  ezetimibe (ZETIA) 10 MG tablet Take 10 mg by mouth every evening.    [provider]  fluticasone (FLONASE) 50 MCG/ACT nasal spray Place 2 sprays into both nostrils daily as needed for allergies. 03/18/13   [provider]  gabapentin (NEURONTIN) 800 MG tablet Take  1 tablet (800 mg total) by mouth 2 (two) times daily. 12/20/21   Ward Givens, NP  hyoscyamine (LEVSIN SL) 0.125 MG SL tablet Place 1 tablet (0.125 mg total) under the tongue every 6 (six) hours as needed. 07/11/22   Esterwood, Amy S, PA-C  loratadine (CLARITIN) 10 MG tablet Take 10 mg by mouth daily.    [provider]  montelukast (SINGULAIR) 10 MG tablet Take 10 mg by mouth at bedtime. 11/21/12   [provider]  Multiple Vitamins-Minerals (MULTIVITAMIN PO) Take 1 tablet by mouth daily.    [provider]  Multiple Vitamins-Minerals (PRESERVISION AREDS PO) Take 1 capsule by mouth daily.    [provider]  omega-3 acid ethyl esters (LOVAZA) 1 G capsule Take 2 g by mouth 2 (two)  times daily. 11/08/12   [provider]  omeprazole (PRILOSEC) 20 MG capsule Take 20 mg by mouth every evening. 11/20/15   [provider]  oxyCODONE (OXY IR/ROXICODONE) 5 MG immediate release tablet Take 1-2 tablets (5-10 mg total) by mouth every 4 (four) hours as needed for moderate pain (pain score 4-6). 02/20/22   Mcarthur Rossetti, MD  OZEMPIC 1 MG/DOSE SOPN Inject 1 mg into the skin every Friday. 01/05/18   [provider]  PHENobarbital (LUMINAL) 97.2 MG tablet TAKE 1 AND 1/2 TABLETS BY MOUTH EVERY DAY 04/04/22   Ward Givens, NP  potassium chloride (KLOR-CON) 20 MEQ packet Take 40 mEq by mouth daily.    [provider]  PRALUENT 150 MG/ML SOAJ Inject 150 mg into the skin every 14 (fourteen) days. 10/03/21   [provider]  Probiotic Product (PROBIOTIC PO) Take 1 capsule by mouth daily.    [provider]  pyridoxine (B-6) 100 MG tablet Take 100 mg by mouth daily.    [provider]  sodium chloride (OCEAN) 0.65 % SOLN nasal spray Place 1 spray into both nostrils as needed for congestion.    [provider]  telmisartan (MICARDIS) 40 MG tablet Take 40 mg by mouth daily.  11/30/13   [provider]  tiZANidine (ZANAFLEX) 4 MG tablet Take 1 tablet (4 mg total) by mouth every 8 (eight) hours as needed for muscle spasms. 07/17/22   Mcarthur Rossetti, MD  topiramate (TOPAMAX) 50 MG tablet TAKE 1 TABLET BY MOUTH EVERY MORNING AND 2 TABLETS EVERY EVENING 12/20/21   Ward Givens, NP  vitamin E 400 UNIT capsule Take 400 Units by mouth daily.    [provider]    Current Outpatient Medications  Medication Sig Dispense Refill   amLODipine (NORVASC) 2.5 MG tablet Take 2.5 mg by mouth at bedtime.     aspirin 81 MG chewable tablet Chew 1 tablet (81 mg total) by mouth 2 (two) times daily. 30 tablet 0   BIOTIN PO Take 1 tablet by mouth daily.     Calcium Carb-Cholecalciferol (CALTRATE 600+D3 PO) Take 1  tablet by mouth daily.     Cholecalciferol (VITAMIN D) 50 MCG (2000 UT) tablet Take 2,000 Units by mouth daily.     Coenzyme Q10 (CO Q 10 PO) Take 300 mg by mouth daily.     diphenoxylate-atropine (LOMOTIL) 2.5-0.025 MG tablet Take 1 tablet by mouth 4 (four) times daily as needed for diarrhea or loose stools. 90 tablet 0   DULoxetine (CYMBALTA) 30 MG capsule Take 30 mg by mouth every evening.  5   ezetimibe (ZETIA) 10 MG tablet Take 10 mg by mouth every evening.  fluticasone (FLONASE) 50 MCG/ACT nasal spray Place 2 sprays into both nostrils daily as needed for allergies.     gabapentin (NEURONTIN) 800 MG tablet Take 1 tablet (800 mg total) by mouth 2 (two) times daily. 180 tablet 3   hyoscyamine (LEVSIN SL) 0.125 MG SL tablet Place 1 tablet (0.125 mg total) under the tongue every 6 (six) hours as needed. 30 tablet 6   loratadine (CLARITIN) 10 MG tablet Take 10 mg by mouth daily.     montelukast (SINGULAIR) 10 MG tablet Take 10 mg by mouth at bedtime.     Multiple Vitamins-Minerals (MULTIVITAMIN PO) Take 1 tablet by mouth daily.     Multiple Vitamins-Minerals (PRESERVISION AREDS PO) Take 1 capsule by mouth daily.     omega-3 acid ethyl esters (LOVAZA) 1 G capsule Take 2 g by mouth 2 (two) times daily.     omeprazole (PRILOSEC) 20 MG capsule Take 20 mg by mouth every evening.  3   oxyCODONE (OXY IR/ROXICODONE) 5 MG immediate release tablet Take 1-2 tablets (5-10 mg total) by mouth every 4 (four) hours as needed for moderate pain (pain score 4-6). 30 tablet 0   OZEMPIC 1 MG/DOSE SOPN Inject 1 mg into the skin every Friday.  6   PHENobarbital (LUMINAL) 97.2 MG tablet TAKE 1 AND 1/2 TABLETS BY MOUTH EVERY DAY 135 tablet 1   potassium chloride (KLOR-CON) 20 MEQ packet Take 40 mEq by mouth daily.     PRALUENT 150 MG/ML SOAJ Inject 150 mg into the skin every 14 (fourteen) days.     Probiotic Product (PROBIOTIC PO) Take 1 capsule by mouth daily.     pyridoxine (B-6) 100 MG tablet Take 100 mg by mouth  daily.     sodium chloride (OCEAN) 0.65 % SOLN nasal spray Place 1 spray into both nostrils as needed for congestion.     telmisartan (MICARDIS) 40 MG tablet Take 40 mg by mouth daily.      tiZANidine (ZANAFLEX) 4 MG tablet Take 1 tablet (4 mg total) by mouth every 8 (eight) hours as needed for muscle spasms. 30 tablet 0   topiramate (TOPAMAX) 50 MG tablet TAKE 1 TABLET BY MOUTH EVERY MORNING AND 2 TABLETS EVERY EVENING 270 tablet 3   vitamin E 400 UNIT capsule Take 400 Units by mouth daily.     No current facility-administered medications for this visit.    Allergies as of 08/19/2022 - Review Complete 07/17/2022  Allergen Reaction Noted   Tylenol [acetaminophen] Swelling and Other (See Comments) 01/08/2011   Latex Itching 04/12/2013    Family History  Problem Relation Age of Onset   Cancer Mother        Cancer of the bone marrow   Heart attack Mother    Stroke Sister    Hypertension Sister    Diabetes Sister    Colon cancer Maternal Aunt    Seizures Neg Hx     Social History   Socioeconomic History   Marital status: Married    Spouse name: Not on file   Number of children: 2   Years of education: 14   Highest education level: Not on file  Occupational History   Occupation: Employed    Comment: Works for [employer], [employer] and [employer]  Tobacco Use   Smoking status: Former    Types: Cigarettes    Quit date: 07/01/1978    Years since quitting: 44.1   Smokeless tobacco: Never  Vaping Use   Vaping Use: Never used  Substance  and Sexual Activity   Alcohol use: Yes    Alcohol/week: 7.0 standard drinks of alcohol    Types: 7 Glasses of wine per week    Comment: Consumes wine with dinner/socially   Drug use: No   Sexual activity: Yes    Partners: Male    Birth control/protection: Surgical    Comment: TAH  Other Topics Concern   Not on file  Social History Narrative   Patient lives at home with her husband Zada Finders).   Retired.   Education two years of business  college.   Right handed.   Caffeine     Social Determinants of Health   Financial Resource Strain: Not on file  Food Insecurity: Not on file  Transportation Needs: Not on file  Physical Activity: Not on file  Stress: Not on file  Social Connections: Not on file  Intimate Partner Violence: Not on file    Physical Exam: Vital signs in last 24 hours: @LMP$  07/01/1980  GEN: NAD EYE: Sclerae anicteric ENT: MMM CV: Non-tachycardic Pulm: CTA b/l GI: Soft, NT/ND NEURO:  Alert & Oriented x 3   Zenovia Jarred, MD [facility]  08/19/2022 9:55 AM

## 2022-08-19 NOTE — Patient Instructions (Signed)
Thank you for coming in to see [place] today! Resume your medications today.   Increase Omeprazole to 20 mg twice per day with meals for 2 months to see if swallowing, throat clearing  and mucus production improves.  Please follow a soft foods diet today.  Handout provided.  Return to regular daily activities tomorrow.  Please follow up with Dr Hilarie Fredrickson in about 8 weeks.   His nurse should call you this week to set up an appointment.  Please call us if you haven't  heard anything by the end of this week.      YOU HAD AN ENDOSCOPIC PROCEDURE TODAY AT [facility]:   Refer to the procedure report that was given to you for any specific questions about what was found during the examination.  If the procedure report does not answer your questions, please call your gastroenterologist to clarify.  If you requested that your care partner not be given the details of your procedure findings, then the procedure report has been included in a sealed envelope for you to review at your convenience later.  YOU SHOULD EXPECT: Some feelings of bloating in the abdomen. Passage of more gas than usual.  Walking can help get rid of the air that was put into your GI tract during the procedure and reduce the bloating. If you had a lower endoscopy (such as a colonoscopy or flexible sigmoidoscopy) you may notice spotting of blood in your stool or on the toilet paper. If you underwent a bowel prep for your procedure, you may not have a normal bowel movement for a few days.  Please Note:  You might notice some irritation and congestion in your nose or some drainage.  This is from the oxygen used during your procedure.  There is no need for concern and it should clear up in a day or so.  SYMPTOMS TO REPORT IMMEDIATELY:    Following upper endoscopy (EGD)  Vomiting of blood or coffee ground material  New chest pain or pain under the shoulder blades  Painful or persistently difficult swallowing  New shortness  of breath  Fever of 100F or higher  Black, tarry-looking stools  For urgent or emergent issues, a gastroenterologist can be reached at any hour by calling [phone]. Do not use MyChart messaging for urgent concerns.    DIET:  We do recommend a small meal at first, but then you may proceed to your regular diet.  Drink plenty of fluids but you should avoid alcoholic beverages for 24 hours.  ACTIVITY:  You should plan to take it easy for the rest of today and you should NOT DRIVE or use heavy machinery until tomorrow (because of the sedation medicines used during the test).    FOLLOW UP: Our staff will call the number listed on your records the next business day following your procedure.  We will call around 7:15- 8:00 am to check on you and address any questions or concerns that you may have regarding the information given to you following your procedure. If we do not reach you, we will leave a message.     If any biopsies were taken you will be contacted by phone or by letter within the next 1-3 weeks.  Please call us at [phone] if you have not heard about the biopsies in 3 weeks.    SIGNATURES/CONFIDENTIALITY: You and/or your care partner have signed paperwork which will be entered into your electronic medical record.  These signatures attest  to the fact that that the information above on your After Visit Summary has been reviewed and is understood.  Full responsibility of the confidentiality of this discharge information lies with you and/or your care-partner. 

## 2022-08-19 NOTE — Op Note (Signed)
[facility] Patient Name: Amber Bennett Procedure Date: 08/19/2022 10:32 AM MRN: [id number] Endoscopist: Jerene Bears , MD, [id number] Age: 72 Referring MD:  Date of Birth: 03-Mar-1951 Gender: Female Account #: [id number] Procedure:                Upper GI endoscopy Indications:              Dysphagia (solid food predominantly),                            Gastro-esophageal reflux disease with no heartburn                            on omeprazole 20 mg daily; increased phlegm and                            mucous production (seen by ENT) Medicines:                Monitored Anesthesia Care Procedure:                Pre-Anesthesia Assessment:                           - Prior to the procedure, a History and Physical                            was performed, and patient medications and                            allergies were reviewed. The patient's tolerance of                            previous anesthesia was also reviewed. The risks                            and benefits of the procedure and the sedation                            options and risks were discussed with the patient.                            All questions were answered, and informed consent                            was obtained. Prior Anticoagulants: The patient has                            taken no anticoagulant or antiplatelet agents. ASA                            Grade Assessment: III - A patient with severe                            systemic disease. After reviewing the risks and  benefits, the patient was deemed in satisfactory                            condition to undergo the procedure.                           After obtaining informed consent, the endoscope was                            passed under direct vision. Throughout the                            procedure, the patient's blood pressure, pulse, and                            oxygen saturations were monitored  continuously. The                            GIF [id number] was introduced through the                            mouth, and advanced to the second part of duodenum.                            The upper GI endoscopy was accomplished without                            difficulty. The patient tolerated the procedure                            well. Scope In: Scope Out: Findings:                 Normal esophageal mucosa. A non-obstructing                            Schatzki ring was found at the gastroesophageal                            junction. The scope was withdrawn. Dilation was                            performed with a Maloney dilator with mild                            resistance at 52 Fr. The dilation site was examined                            following endoscope reinsertion and showed no                            change in the esophagus (there was mild trauma with                            self-limited minor  bleeding in the posterior                            oropharynx).                           A 1 cm hiatal hernia was present.                           The entire examined stomach was normal.                           The examined duodenum was normal. Complications:            No immediate complications. Estimated Blood Loss:     Estimated blood loss was minimal. Impression:               - Non-obstructing Schatzki ring. Dilated with 52                            Fr. Venia Minks.                           - 1 cm hiatal hernia.                           - Normal stomach.                           - Normal examined duodenum.                           - No specimens collected. Recommendation:           - Patient has a contact number available for                            emergencies. The signs and symptoms of potential                            delayed complications were discussed with the                            patient. Return to normal activities tomorrow.                             Written discharge instructions were provided to the                            patient.                           - Post-dilation diet and then advance diet as                            tolerated.                           - Continue present medications.  Increase omeprazole                            to 20 mg BID-AC x 8 weeks (to see if swallowing,                            throat clearing and mucus production improves) and                            follow-up with me in clinic. Jerene Bears, MD 08/19/2022 10:54:32 AM This report has been signed electronically.

## 2022-08-20 ENCOUNTER — Telehealth: Payer: Self-pay

## 2022-08-20 NOTE — Telephone Encounter (Signed)
Left message on answering machine. 

## 2022-08-23 DIAGNOSIS — H26493 Other secondary cataract, bilateral: Secondary | ICD-10-CM | POA: Diagnosis not present

## 2022-08-23 DIAGNOSIS — Z961 Presence of intraocular lens: Secondary | ICD-10-CM | POA: Diagnosis not present

## 2022-08-23 DIAGNOSIS — H52203 Unspecified astigmatism, bilateral: Secondary | ICD-10-CM | POA: Diagnosis not present

## 2022-08-23 DIAGNOSIS — E119 Type 2 diabetes mellitus without complications: Secondary | ICD-10-CM | POA: Diagnosis not present

## 2022-08-29 DIAGNOSIS — H26491 Other secondary cataract, right eye: Secondary | ICD-10-CM | POA: Diagnosis not present

## 2022-08-29 DIAGNOSIS — H26492 Other secondary cataract, left eye: Secondary | ICD-10-CM | POA: Diagnosis not present

## 2022-08-30 ENCOUNTER — Telehealth: Payer: Self-pay | Admitting: Orthopaedic Surgery

## 2022-08-30 MED ORDER — TIZANIDINE HCL 4 MG PO TABS: 4.0000 mg | ORAL_TABLET | Freq: Three times a day (TID) | ORAL | 0 refills | Status: DC | PRN

## 2022-08-30 NOTE — Telephone Encounter (Signed)
Sent to pharmacy, lvm advising pt

## 2022-08-30 NOTE — Telephone Encounter (Signed)
Patient called. She would like a refill on her muscle relaxer. Her call back number is [phone]

## 2022-09-05 ENCOUNTER — Encounter: Payer: Self-pay | Admitting: Radiology

## 2022-09-18 ENCOUNTER — Other Ambulatory Visit: Payer: Self-pay | Admitting: Adult Health

## 2022-10-08 ENCOUNTER — Telehealth: Payer: Self-pay | Admitting: Orthopaedic Surgery

## 2022-10-08 ENCOUNTER — Other Ambulatory Visit: Payer: Self-pay | Admitting: Orthopaedic Surgery

## 2022-10-08 MED ORDER — TIZANIDINE HCL 4 MG PO TABS: 4.0000 mg | ORAL_TABLET | Freq: Three times a day (TID) | ORAL | 0 refills | Status: DC | PRN

## 2022-10-08 NOTE — Telephone Encounter (Signed)
Patient asking to get her Tanazidine 4mg  refilled. Please advise

## 2022-10-10 ENCOUNTER — Other Ambulatory Visit: Payer: Self-pay | Admitting: Adult Health

## 2022-10-10 DIAGNOSIS — M791 Myalgia, unspecified site: Secondary | ICD-10-CM | POA: Diagnosis not present

## 2022-10-14 ENCOUNTER — Other Ambulatory Visit: Payer: Self-pay | Admitting: Adult Health

## 2022-10-14 NOTE — Telephone Encounter (Signed)
Pt stated she need to know when PHENobarbital will be sent to [id number] pharmacy. Pt is requesting a 90 day supply. Pt said she has been out of medication since Friday.

## 2022-10-15 NOTE — Telephone Encounter (Signed)
Sent via mychart msg

## 2022-10-15 NOTE — Telephone Encounter (Signed)
Meds ordered this encounter  Medications   PHENobarbital (LUMINAL) 97.2 MG tablet    Sig: TAKE 1 AND 1/2 TABLETS BY MOUTH EVERY DAY    Dispense:  135 tablet    Refill:  3

## 2022-10-16 NOTE — Telephone Encounter (Signed)
Requested Prescriptions   Pending Prescriptions Disp Refills   PHENobarbital (LUMINAL) 97.2 MG tablet [Pharmacy Med Name: PHENOBARBITAL 97.2MG  TABLETS] 135 tablet     Sig: TAKE 1 AND 1/2 TABLETS BY MOUTH EVERY DAY   Last seen by Ethelene Browns NP on 12/20/21,  upcoming visit on 12/11/22 a refill was sent yesterday to  [id number] - [place], [place] - [address] [facility] [address]  Called and spoke to pt and told her that we sent that and she stated that her pcp filled 8 tablets and I stated that we refilled it yesterday since she was running out.  PHENobarbital Adherence  Dispenses   Dispensed Days Supply Quantity Provider Pharmacy  PHENOBARBITAL 97.2MG  TABLETS 07/16/2022 84 127 each Butch Penny, NP [facility] #...  PHENOBARBITAL 97.2MG  TABLETS 07/15/2022 5 8 each Butch Penny, NP [facility] #...  PHENOBARBITAL 97.2MG  TABLETS 04/07/2022 90 135 each Butch Penny, NP [facility] #...  PHENOBARBITAL 97.2MG  TABLETS 12/28/2021 90 135 each Butch Penny, NP [facility] #.Marland KitchenMarland Kitchen

## 2022-10-17 DIAGNOSIS — M791 Myalgia, unspecified site: Secondary | ICD-10-CM | POA: Diagnosis not present

## 2022-10-18 ENCOUNTER — Other Ambulatory Visit: Payer: Self-pay | Admitting: Internal Medicine

## 2022-10-18 DIAGNOSIS — Z1231 Encounter for screening mammogram for malignant neoplasm of breast: Secondary | ICD-10-CM

## 2022-10-21 DIAGNOSIS — R7301 Impaired fasting glucose: Secondary | ICD-10-CM | POA: Diagnosis not present

## 2022-10-21 DIAGNOSIS — M859 Disorder of bone density and structure, unspecified: Secondary | ICD-10-CM | POA: Diagnosis not present

## 2022-10-21 DIAGNOSIS — I10 Essential (primary) hypertension: Secondary | ICD-10-CM | POA: Diagnosis not present

## 2022-10-21 DIAGNOSIS — Z79899 Other long term (current) drug therapy: Secondary | ICD-10-CM | POA: Diagnosis not present

## 2022-10-21 DIAGNOSIS — D649 Anemia, unspecified: Secondary | ICD-10-CM | POA: Diagnosis not present

## 2022-10-21 DIAGNOSIS — K219 Gastro-esophageal reflux disease without esophagitis: Secondary | ICD-10-CM | POA: Diagnosis not present

## 2022-10-21 DIAGNOSIS — Z1212 Encounter for screening for malignant neoplasm of rectum: Secondary | ICD-10-CM | POA: Diagnosis not present

## 2022-10-21 DIAGNOSIS — M8589 Other specified disorders of bone density and structure, multiple sites: Secondary | ICD-10-CM | POA: Diagnosis not present

## 2022-10-21 DIAGNOSIS — E785 Hyperlipidemia, unspecified: Secondary | ICD-10-CM | POA: Diagnosis not present

## 2022-10-22 DIAGNOSIS — Z79899 Other long term (current) drug therapy: Secondary | ICD-10-CM | POA: Diagnosis not present

## 2022-10-22 DIAGNOSIS — D649 Anemia, unspecified: Secondary | ICD-10-CM | POA: Diagnosis not present

## 2022-10-28 DIAGNOSIS — K625 Hemorrhage of anus and rectum: Secondary | ICD-10-CM | POA: Diagnosis not present

## 2022-10-28 DIAGNOSIS — I700 Atherosclerosis of aorta: Secondary | ICD-10-CM | POA: Diagnosis not present

## 2022-10-28 DIAGNOSIS — K760 Fatty (change of) liver, not elsewhere classified: Secondary | ICD-10-CM | POA: Diagnosis not present

## 2022-10-28 DIAGNOSIS — G5602 Carpal tunnel syndrome, left upper limb: Secondary | ICD-10-CM | POA: Diagnosis not present

## 2022-10-28 DIAGNOSIS — K219 Gastro-esophageal reflux disease without esophagitis: Secondary | ICD-10-CM | POA: Diagnosis not present

## 2022-10-28 DIAGNOSIS — E876 Hypokalemia: Secondary | ICD-10-CM | POA: Diagnosis not present

## 2022-10-28 DIAGNOSIS — Z0000 Encounter for general adult medical examination without abnormal findings: Secondary | ICD-10-CM | POA: Diagnosis not present

## 2022-10-28 DIAGNOSIS — I440 Atrioventricular block, first degree: Secondary | ICD-10-CM | POA: Diagnosis not present

## 2022-10-28 DIAGNOSIS — R82998 Other abnormal findings in urine: Secondary | ICD-10-CM | POA: Diagnosis not present

## 2022-10-28 DIAGNOSIS — R569 Unspecified convulsions: Secondary | ICD-10-CM | POA: Diagnosis not present

## 2022-10-28 DIAGNOSIS — E785 Hyperlipidemia, unspecified: Secondary | ICD-10-CM | POA: Diagnosis not present

## 2022-10-28 DIAGNOSIS — I10 Essential (primary) hypertension: Secondary | ICD-10-CM | POA: Diagnosis not present

## 2022-10-28 DIAGNOSIS — M797 Fibromyalgia: Secondary | ICD-10-CM | POA: Diagnosis not present

## 2022-10-28 DIAGNOSIS — M3390 Dermatopolymyositis, unspecified, organ involvement unspecified: Secondary | ICD-10-CM | POA: Diagnosis not present

## 2022-10-28 DIAGNOSIS — H3530 Unspecified macular degeneration: Secondary | ICD-10-CM | POA: Diagnosis not present

## 2022-10-28 DIAGNOSIS — R7301 Impaired fasting glucose: Secondary | ICD-10-CM | POA: Diagnosis not present

## 2022-10-28 DIAGNOSIS — Z96641 Presence of right artificial hip joint: Secondary | ICD-10-CM | POA: Diagnosis not present

## 2022-10-28 DIAGNOSIS — M810 Age-related osteoporosis without current pathological fracture: Secondary | ICD-10-CM | POA: Diagnosis not present

## 2022-10-28 DIAGNOSIS — J189 Pneumonia, unspecified organism: Secondary | ICD-10-CM | POA: Diagnosis not present

## 2022-10-30 DIAGNOSIS — M797 Fibromyalgia: Secondary | ICD-10-CM | POA: Diagnosis not present

## 2022-10-30 DIAGNOSIS — M1991 Primary osteoarthritis, unspecified site: Secondary | ICD-10-CM | POA: Diagnosis not present

## 2022-10-30 DIAGNOSIS — M3390 Dermatopolymyositis, unspecified, organ involvement unspecified: Secondary | ICD-10-CM | POA: Diagnosis not present

## 2022-10-30 DIAGNOSIS — Z6833 Body mass index (BMI) 33.0-33.9, adult: Secondary | ICD-10-CM | POA: Diagnosis not present

## 2022-10-30 DIAGNOSIS — M2559 Pain in other specified joint: Secondary | ICD-10-CM | POA: Diagnosis not present

## 2022-10-30 DIAGNOSIS — R768 Other specified abnormal immunological findings in serum: Secondary | ICD-10-CM | POA: Diagnosis not present

## 2022-10-30 DIAGNOSIS — M79641 Pain in right hand: Secondary | ICD-10-CM | POA: Diagnosis not present

## 2022-10-30 DIAGNOSIS — E669 Obesity, unspecified: Secondary | ICD-10-CM | POA: Diagnosis not present

## 2022-10-30 DIAGNOSIS — M25511 Pain in right shoulder: Secondary | ICD-10-CM | POA: Diagnosis not present

## 2022-11-13 DIAGNOSIS — H6123 Impacted cerumen, bilateral: Secondary | ICD-10-CM | POA: Diagnosis not present

## 2022-11-13 DIAGNOSIS — I10 Essential (primary) hypertension: Secondary | ICD-10-CM | POA: Diagnosis not present

## 2022-11-26 ENCOUNTER — Ambulatory Visit
Admission: RE | Admit: 2022-11-26 | Discharge: 2022-11-26 | Disposition: A | Discharge status: Home / Self Care | Payer: Medicare Other | Source: Ambulatory Visit | Attending: Internal Medicine | Admitting: Internal Medicine

## 2022-11-26 DIAGNOSIS — Z1231 Encounter for screening mammogram for malignant neoplasm of breast: Secondary | ICD-10-CM | POA: Diagnosis not present

## 2022-11-28 ENCOUNTER — Telehealth: Payer: Self-pay | Admitting: Orthopaedic Surgery

## 2022-11-28 ENCOUNTER — Other Ambulatory Visit: Payer: Self-pay | Admitting: Orthopaedic Surgery

## 2022-11-28 MED ORDER — TIZANIDINE HCL 4 MG PO TABS: 4.0000 mg | ORAL_TABLET | Freq: Two times a day (BID) | ORAL | 0 refills | Status: DC | PRN

## 2022-11-28 NOTE — Telephone Encounter (Signed)
Patient called needing Rx refilled Tizanidine 4 mg. The number to contact patient is [phone]

## 2022-12-11 ENCOUNTER — Encounter: Payer: Self-pay | Admitting: Adult Health

## 2022-12-11 ENCOUNTER — Telehealth: Payer: Self-pay

## 2022-12-11 ENCOUNTER — Ambulatory Visit: Payer: Medicare Other | Admitting: Adult Health

## 2022-12-11 ENCOUNTER — Ambulatory Visit (INDEPENDENT_AMBULATORY_CARE_PROVIDER_SITE_OTHER): Payer: Medicare Other | Admitting: Adult Health

## 2022-12-11 VITALS — BP 145/91 | HR 127 | Ht <= 58 in | Wt 158.4 lb

## 2022-12-11 DIAGNOSIS — I4892 Unspecified atrial flutter: Secondary | ICD-10-CM | POA: Diagnosis not present

## 2022-12-11 DIAGNOSIS — I10 Essential (primary) hypertension: Secondary | ICD-10-CM | POA: Diagnosis not present

## 2022-12-11 DIAGNOSIS — R569 Unspecified convulsions: Secondary | ICD-10-CM | POA: Diagnosis not present

## 2022-12-11 DIAGNOSIS — K760 Fatty (change of) liver, not elsewhere classified: Secondary | ICD-10-CM | POA: Diagnosis not present

## 2022-12-11 DIAGNOSIS — E785 Hyperlipidemia, unspecified: Secondary | ICD-10-CM | POA: Diagnosis not present

## 2022-12-11 LAB — LAB REPORT - SCANNED
A1c: 4.8
Albumin, Urine POC: 6.2
Albumin/Creatinine Ratio, Urine, POC: 19
Creatinine, POC: 31.8 mg/dL
EGFR: 81.8

## 2022-12-11 NOTE — Progress Notes (Signed)
PATIENT: Amber Bennett DOB: 01-29-1951  REASON FOR VISIT: follow up HISTORY FROM: patient  Primary neurologist: Dr. Anne Hahn will transition to Dr. Teresa Coombs  Chief Complaint  Patient presents with   Follow-up    Pt in 19 Pt here for seizures f/u  Pt needing refills for Gabapentin and Phenobarbital(90 day supply )      HISTORY OF PRESENT ILLNESS: Today 12/11/22:  Amber Bennett is a 72 y.o. female with a history of Seizures. Returns today for follow-up. Reports that she is doing well. No seizure events. Remains on Topamax, Gabapentin and Phenobarbital. History of  low back pain. Dr. Yetta Barre assistant gave her an injection and it was helpful. Had hip surgery 01/2022 and that has also helped pain.  No change in gait or balance. No falls. Tolerates dose well with no SE.   Today her heart rate and blood pressure is elevated.  Her spouse states that while she was at home sitting on the couch recently her watch alerted her that her heart rate was up. She doesn't feel SOB or lightheaded.     12/20/21: [name] is a 72 year old female with a history of seizures.  She returns today for follow-up.  Overall she feels that she is doing well.  Denies any seizure events.  Continues on Topamax and phenobarbital.  She operates a motor vehicle without difficulty.  Able to complete all ADLs independently.  She has chronic history of low back pain.  Continues on gabapentin and Cymbalta.  Reports that this continues to work well for her.  Have found that she had two tears in the right hip which could be the cause of some of back pain. 8/15 will have a total hip replacement.    02/07/21 [name] is a 72 year old female with a history of seizures. She returns today for follow-up. She remains on topmax and phenobarbital. Denies any siezure events. Operates a motor vehicle without difficulty. Completes all ADLS independently. Continues on gabapentin and Cymbalta for back pain. Returns today for follow-up.    02/02/20: [name] is a 72 year old female with a history of seizures.  She returns today for follow-up.  She remains on Topamax and phenobarbital.  Denies any seizure events.  No changes with her gait or balance.  She also has a history of low back pain.  She continues to take gabapentin and Cymbalta.  Reports that this continues to work well for her.  She returns today for an evaluation.  HISTORY [name] is a 72 year old right-handed white female with history of seizures that have been well controlled on Topamax and phenobarbital.  The patient has not had any seizures since last seen.  She does have a history of low back pain that has worsened slightly as she is packing boxes to move.  She takes gabapentin as well for her low back and is on Cymbalta.  She returns to this office for an evaluation.  On an annual basis, her primary care physician checks the phenobarbital level.  The last level 1 year ago was 23  REVIEW OF SYSTEMS: Out of a complete 14 system review of symptoms, the patient complains only of the following symptoms, and all other reviewed systems are negative.  See HPI  ALLERGIES: Allergies  Allergen Reactions   Tylenol [Acetaminophen] Swelling and Other (See Comments)    Throat swelling, anaphylaxis   Latex Itching    Rash     HOME MEDICATIONS: Outpatient Medications Prior to Visit  Medication Sig Dispense Refill   amLODipine (NORVASC) 2.5 MG tablet Take 2.5 mg by mouth at bedtime.     BIOTIN PO Take 1 tablet by mouth daily.     Calcium Carb-Cholecalciferol (CALTRATE 600+D3 PO) Take 1 tablet by mouth daily.     Cholecalciferol (VITAMIN D) 50 MCG (2000 UT) tablet Take 2,000 Units by mouth daily.     Coenzyme Q10 (CO Q 10 PO) Take 300 mg by mouth daily.     diphenoxylate-atropine (LOMOTIL) 2.5-0.025 MG tablet Take 1 tablet by mouth 4 (four) times daily as needed for diarrhea or loose stools. 90 tablet 0   DULoxetine (CYMBALTA) 30 MG capsule Take 30 mg by mouth every  evening.  5   ezetimibe (ZETIA) 10 MG tablet Take 10 mg by mouth every evening.     gabapentin (NEURONTIN) 800 MG tablet Take 1 tablet (800 mg total) by mouth 2 (two) times daily. 180 tablet 3   hyoscyamine (LEVSIN SL) 0.125 MG SL tablet Place 1 tablet (0.125 mg total) under the tongue every 6 (six) hours as needed. 30 tablet 6   loratadine (CLARITIN) 10 MG tablet Take 10 mg by mouth daily.     montelukast (SINGULAIR) 10 MG tablet Take 10 mg by mouth at bedtime.     Multiple Vitamins-Minerals (MULTIVITAMIN PO) Take 1 tablet by mouth daily.     Multiple Vitamins-Minerals (PRESERVISION AREDS PO) Take 1 capsule by mouth daily.     omega-3 acid ethyl esters (LOVAZA) 1 G capsule Take 2 g by mouth 2 (two) times daily.     omeprazole (PRILOSEC) 20 MG capsule Take 20 mg by mouth 2 (two) times daily before a meal. Then reduce to once daily  3   OZEMPIC 1 MG/DOSE SOPN Inject 1 mg into the skin every Friday.  6   PHENobarbital (LUMINAL) 97.2 MG tablet TAKE 1 AND 1/2 TABLETS BY MOUTH EVERY DAY 135 tablet 3   potassium chloride (KLOR-CON) 20 MEQ packet Take 40 mEq by mouth daily.     potassium chloride SA (KLOR-CON M) 20 MEQ tablet Take 60 mEq by mouth daily.     PRALUENT 150 MG/ML SOAJ Inject 150 mg into the skin every 14 (fourteen) days.     Probiotic Product (PROBIOTIC PO) Take 1 capsule by mouth daily.     pyridoxine (B-6) 100 MG tablet Take 100 mg by mouth daily.     sodium chloride (OCEAN) 0.65 % SOLN nasal spray Place 1 spray into both nostrils as needed for congestion.     telmisartan (MICARDIS) 40 MG tablet Take 40 mg by mouth daily.      tiZANidine (ZANAFLEX) 4 MG tablet Take 1 tablet (4 mg total) by mouth 2 (two) times daily as needed for muscle spasms. 30 tablet 0   topiramate (TOPAMAX) 50 MG tablet TAKE 1 TABLET BY MOUTH EVERY MORNING AND 2 TABLETS EVERY EVENING 270 tablet 3   vitamin E 400 UNIT capsule Take 400 Units by mouth daily.     No facility-administered medications prior to visit.     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   Carpal tunnel syndrome    Cervical radiculitis    Depression    Dermatomyositis (HCC)    Diabetes mellitus without complication (HCC)    Elevated liver enzymes    External hemorrhoids    Fatty liver    Fibromyalgia affecting hand 08/2022  bilateral wrist and hands   Hematuria    negative evaluation   High cholesterol    HTN (hypertension)    Internal hemorrhoids    Laryngitis    Lower back pain    Macular degeneration    Osteopenia    Left side hip   Seizures (HCC)    09/07/2019 pt reports last seizure in 2014   Tubular adenoma of colon     PAST SURGICAL HISTORY: Past Surgical History:  Procedure Laterality Date   ABDOMINAL HYSTERECTOMY  1982   APPENDECTOMY     BACK SURGERY     BACK SURGERY  07/2015   CARPAL TUNNEL RELEASE     CATARACT EXTRACTION, BILATERAL     CHOLECYSTECTOMY  2003   COLONOSCOPY     EXPLORATORY LAPAROTOMY     LAMINECTOMY     SHOULDER SURGERY Right 2000   TONSILLECTOMY     TOTAL HIP ARTHROPLASTY Right 02/12/2022   Procedure: RIGHT TOTAL HIP ARTHROPLASTY ANTERIOR APPROACH;  Surgeon: Kathryne Hitch, MD;  Location: [facility] OR;  Service: Orthopedics;  Laterality: Right;    FAMILY HISTORY: Family History  Problem Relation Age of Onset   Cancer Mother        Cancer of the bone marrow   Heart attack Mother    Stroke Sister    Hypertension Sister    Diabetes Sister    Colon cancer Maternal Aunt    Seizures Neg Hx    Esophageal cancer Neg Hx    Rectal cancer Neg Hx    Stomach cancer Neg Hx     SOCIAL HISTORY: Social History   Socioeconomic History   Marital status: Married    Spouse name: Not on file   Number of children: 2   Years of education: 14   Highest education level: Not on file  Occupational History   Occupation: Employed    Comment: Works for [employer], [employer] and [employer]  Tobacco Use    Smoking status: Former    Types: Cigarettes    Quit date: 07/01/1978    Years since quitting: 44.4   Smokeless tobacco: Never  Vaping Use   Vaping Use: Never used  Substance and Sexual Activity   Alcohol use: Yes    Alcohol/week: 7.0 standard drinks of alcohol    Types: 7 Glasses of wine per week    Comment: Consumes wine with dinner/socially   Drug use: No   Sexual activity: Yes    Partners: Male    Birth control/protection: Surgical    Comment: TAH  Other Topics Concern   Not on file  Social History Narrative   Patient lives at home with her husband Cloyd Stagers).   Retired.   Education two years of business college.   Right handed.   Caffeine     Social Determinants of Health   Financial Resource Strain: Not on file  Food Insecurity: Not on file  Transportation Needs: Not on file  Physical Activity: Not on file  Stress: Not on file  Social Connections: Not on file  Intimate Partner Violence: Not on file      PHYSICAL EXAM  Vitals:   12/11/22 1345 12/11/22 1417  BP: (!) 163/95 (!) 145/91  Pulse: (!) 137 (!) 127  Weight: 158 lb 6.4 oz (71.8 kg)   Height: 4\' 10"  (1.473 m)     Body mass index is 33.11 kg/m.  Generalized: Well developed, in no acute distress   Neurological examination  Mentation: Alert oriented to time, place, history  taking. Follows all commands speech and language fluent Cranial nerve II-XII: Pupils were equal round reactive to light. Extraocular movements were full, visual field were full on confrontational test. Head turning and shoulder shrug  were normal and symmetric. Motor: The motor testing reveals 5 over 5 strength of all 4 extremities. Good symmetric motor tone is noted throughout.  Sensory: Sensory testing is intact to soft touch on all 4 extremities. No evidence of extinction is noted.  Coordination: Cerebellar testing reveals good finger-nose-finger and heel-to-shin bilaterally.  Gait and station: Gait is normal.    DIAGNOSTIC DATA  (LABS, IMAGING, TESTING) - I reviewed patient records, labs, notes, testing and imaging myself where available.     ASSESSMENT AND PLAN 72 y.o. year old female  has a past medical history of Atherosclerosis of aorta (HCC), Bertolotti's syndrome, Carpal tunnel syndrome, Cervical radiculitis, Depression, Dermatomyositis (HCC), Diabetes mellitus without complication (HCC), Elevated liver enzymes, External hemorrhoids, Fatty liver, Fibromyalgia affecting hand (08/2022), Hematuria, High cholesterol, HTN (hypertension), Internal hemorrhoids, Laryngitis, Lower back pain, Macular degeneration, Osteopenia, Seizures (HCC), and Tubular adenoma of colon. here with:  Seizures  Continue phenobarbital 97.2 mg 1 1/2 tablets daily  Continue Topamax 50 mg in the AM, 100 mg in the PM Continue 800 mg BID Levels are checked by her PCP  Elevated blood pressure and heart rate today.  Blood pressure did improve at the second recheck.  However heart rate remained elevated.  She was advised to discuss with Dr. Waynard Edwards.  She states that if she was unable to get in touch with Dr. Waynard Edwards she would talk to her son Dr. Clelia Croft.   Butch Penny, MSN, NP-C 12/11/2022, 1:37 PM [facility] [address], [address] [place], [place] [address] [phone]

## 2022-12-11 NOTE — Telephone Encounter (Signed)
Called patient, per request of Dr.O'Neal and Dr.Croitoru to get scheduled for an office visit.  Patient did not answer, LVM to call back number to reach out to me to get scheduled for Dr.O'Neal tomorrow.   Thanks!

## 2022-12-11 NOTE — Progress Notes (Signed)
Cardiology Office Note:   Date:  12/12/2022  NAME:  Amber Bennett    MRN: [id number] DOB:  1951-01-09   PCP:  Amber Ran, MD  Cardiologist:  None  Electrophysiologist:  None   Referring MD: Amber Ran, MD   Chief Complaint  Patient presents with   Atrial Flutter    History of Present Illness:   Amber Bennett is a 72 y.o. female with a hx of atrial flutter, DM, HTND who is being seen today for the evaluation of atrial flutter at the request of Amber Ran, MD. she was seen in her neurologist office yesterday.  Found to be in atrial flutter with heart rate in the 120s.  She reports she is unaware of it.  Over the past 3 to 4 months she has had chest tightness.  Described as left-sided.  Can occur at rest.  Also with exercise.  She also reports shortness of breath.  She can do things like shopping without issues but apparently walking outside can get her quite short of breath.  She reports no rapid heartbeat sensation.  She is never had atrial fibrillation or flutter before.  She reports no strong family history of heart disease.  She is on Praluent.  LDL cholesterol is at goal.  She is diabetic but her A1c is 4.8.  This is well-controlled on Ozempic.  Her blood pressure is also well-controlled.  She is back in normal rhythm today.  She does not smoke.  No alcohol or drug use.  She is retired.  She has 2 children and several grandchildren.  Her son is Amber Bennett who is a internal medicine doc in town.  CV exam unremarkable.  We discussed further evaluation today.  Problem List Atrial flutter  -DX 12/10/2022 2. DM -A1c 4.8 3. HTN 4. HLD -T chol 162, HDL 81, LDL 63, TG 89 5. Seizure disorder  6. Dermatomyositis   Past Medical History: Past Medical History:  Diagnosis Date   Atherosclerosis of aorta (HCC)    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   Carpal tunnel syndrome    Cervical radiculitis    Depression    Dermatomyositis (HCC)    Diabetes mellitus without complication (HCC)     Elevated liver enzymes    External hemorrhoids    Fatty liver    Fibromyalgia affecting hand 08/2022   bilateral wrist and hands   Hematuria    negative evaluation   High cholesterol    HTN (hypertension)    Internal hemorrhoids    Laryngitis    Lower back pain    Macular degeneration    Osteopenia    Left side hip   Seizures (HCC)    09/07/2019 pt reports last seizure in 2014   Tubular adenoma of colon     Past Surgical History: Past Surgical History:  Procedure Laterality Date   ABDOMINAL HYSTERECTOMY  1982   APPENDECTOMY     BACK SURGERY     BACK SURGERY  07/2015   CARPAL TUNNEL RELEASE     CATARACT EXTRACTION, BILATERAL     CHOLECYSTECTOMY  2003   COLONOSCOPY     EXPLORATORY LAPAROTOMY     LAMINECTOMY     SHOULDER SURGERY Right 2000   TONSILLECTOMY     TOTAL HIP ARTHROPLASTY Right 02/12/2022   Procedure: RIGHT TOTAL HIP ARTHROPLASTY ANTERIOR APPROACH;  Surgeon: Amber Hitch, MD;  Location: [facility] OR;  Service: Orthopedics;  Laterality: Right;    Current Medications: Current Meds  Medication Sig   amLODipine (NORVASC) 2.5 MG tablet Take 2.5 mg by mouth at bedtime.   BIOTIN PO Take 1 tablet by mouth daily.   Calcium Carb-Cholecalciferol (CALTRATE 600+D3 PO) Take 1 tablet by mouth daily.   Cholecalciferol (VITAMIN D) 50 MCG (2000 UT) tablet Take 2,000 Units by mouth daily.   Coenzyme Q10 (CO Q 10 PO) Take 300 mg by mouth daily.   diphenoxylate-atropine (LOMOTIL) 2.5-0.025 MG tablet Take 1 tablet by mouth 4 (four) times daily as needed for diarrhea or loose stools.   DULoxetine (CYMBALTA) 30 MG capsule Take 30 mg by mouth every evening.   ezetimibe (ZETIA) 10 MG tablet Take 10 mg by mouth every evening.   gabapentin (NEURONTIN) 800 MG tablet Take 1 tablet (800 mg total) by mouth 2 (two) times daily.   hyoscyamine (LEVSIN SL) 0.125 MG SL tablet Place 1 tablet (0.125 mg total) under the tongue every 6 (six) hours as needed.   loratadine (CLARITIN) 10 MG tablet  Take 10 mg by mouth daily.   montelukast (SINGULAIR) 10 MG tablet Take 10 mg by mouth at bedtime.   Multiple Vitamins-Minerals (MULTIVITAMIN PO) Take 1 tablet by mouth daily.   Multiple Vitamins-Minerals (PRESERVISION AREDS PO) Take 1 capsule by mouth daily.   omega-3 acid ethyl esters (LOVAZA) 1 G capsule Take 2 g by mouth 2 (two) times daily.   OZEMPIC 1 MG/DOSE SOPN Inject 1 mg into the skin every Friday.   PHENobarbital (LUMINAL) 97.2 MG tablet TAKE 1 AND 1/2 TABLETS BY MOUTH EVERY DAY   potassium chloride (KLOR-CON) 20 MEQ packet Take 40 mEq by mouth daily.   potassium chloride SA (KLOR-CON M) 20 MEQ tablet Take 60 mEq by mouth daily.   PRALUENT 150 MG/ML SOAJ Inject 150 mg into the skin every 14 (fourteen) days.   Probiotic Product (PROBIOTIC PO) Take 1 capsule by mouth daily.   pyridoxine (B-6) 100 MG tablet Take 100 mg by mouth daily.   sodium chloride (OCEAN) 0.65 % SOLN nasal spray Place 1 spray into both nostrils as needed for congestion.   telmisartan (MICARDIS) 40 MG tablet Take 40 mg by mouth daily.    tiZANidine (ZANAFLEX) 4 MG tablet Take 1 tablet (4 mg total) by mouth 2 (two) times daily as needed for muscle spasms.   topiramate (TOPAMAX) 50 MG tablet TAKE 1 TABLET BY MOUTH EVERY MORNING AND 2 TABLETS EVERY EVENING   vitamin E 400 UNIT capsule Take 400 Units by mouth daily.   [DISCONTINUED] apixaban (ELIQUIS) 5 MG TABS tablet Take 5 mg by mouth 2 (two) times daily.   [DISCONTINUED] metoprolol tartrate (LOPRESSOR) 25 MG tablet Take 25 mg by mouth 2 (two) times daily.     Allergies:    Tylenol [acetaminophen] and Latex   Social History: Social History   Socioeconomic History   Marital status: Married    Spouse name: Not on file   Number of children: 2   Years of education: 14   Highest education level: Not on file  Occupational History   Occupation: Employed    Comment: Works for [employer], [employer] and [employer]  Tobacco Use   Smoking status: Former    Types:  Cigarettes    Quit date: 07/01/1978    Years since quitting: 44.4   Smokeless tobacco: Never  Vaping Use   Vaping Use: Never used  Substance and Sexual Activity   Alcohol use: Not Currently    Alcohol/week: 7.0 standard drinks of alcohol    Types: 7  Glasses of wine per week   Drug use: No   Sexual activity: Yes    Partners: Male    Birth control/protection: Surgical    Comment: TAH  Other Topics Concern   Not on file  Social History Narrative   Patient lives at home with her husband Cloyd Stagers).   Retired.   Education two years of business college.   Right handed.   Caffeine     Social Determinants of Health   Financial Resource Strain: Not on file  Food Insecurity: Not on file  Transportation Needs: Not on file  Physical Activity: Not on file  Stress: Not on file  Social Connections: Not on file     Family History: The patient's family history includes Cancer in her mother; Colon cancer in her maternal aunt; Diabetes in her sister; Heart attack in her mother; Hypertension in her sister; Stroke in her sister. There is no history of Seizures, Esophageal cancer, Rectal cancer, or Stomach cancer.  ROS:   All other ROS reviewed and negative. Pertinent positives noted in the HPI.     EKGs/Labs/Other Studies Reviewed:   The following studies were personally reviewed by me today:  EKG:  EKG is ordered today.  The ekg ordered today demonstrates normal sinus rhythm heart rate 80, first-degree AV block, PACs noted, and was personally reviewed by me.   Recent Labs: 02/05/2022: ALT 82 02/13/2022: BUN 8; Creatinine, Ser 0.54; Hemoglobin 9.7; Platelets 133; Potassium 4.7; Sodium 137   Recent Lipid Panel No results found for: "CHOL", "TRIG", "HDL", "CHOLHDL", "VLDL", "LDLCALC", "LDLDIRECT"  Physical Exam:   VS:  BP 120/64 (BP Location: Left Arm, Patient Position: Sitting, Cuff Size: Normal)   Pulse 80   Ht 4\' 10"  (1.473 m)   Wt 154 lb 12.8 oz (70.2 kg)   LMP 07/01/1980   SpO2 97%    BMI 32.35 kg/m    Wt Readings from Last 3 Encounters:  12/12/22 154 lb 12.8 oz (70.2 kg)  12/11/22 158 lb 6.4 oz (71.8 kg)  08/19/22 160 lb (72.6 kg)    General: Well nourished, well developed, in no acute distress Head: Atraumatic, normal size  Eyes: PEERLA, EOMI  Neck: Supple, no JVD Endocrine: No thryomegaly Cardiac: Normal S1, S2; RRR; no murmurs, rubs, or gallops Lungs: Clear to auscultation bilaterally, no wheezing, rhonchi or rales  Abd: Soft, nontender, no hepatomegaly  Ext: No edema, pulses 2+ Musculoskeletal: No deformities, BUE and BLE strength normal and equal Skin: Warm and dry, no rashes   Neuro: Alert and oriented to person, place, time, and situation, CNII-XII grossly intact, no focal deficits  Psych: Normal mood and affect   ASSESSMENT:   Amber Bennett is a 72 y.o. female who presents for the following: 1. Typical atrial flutter (HCC)   2. SOB (shortness of breath) on exertion   3. Precordial pain   4. Renovascular hypertension   5. Mixed hyperlipidemia     PLAN:   1. Typical atrial flutter (HCC) 2. SOB (shortness of breath) on exertion 3. Precordial pain -Atrial flutter yesterday.  Back in sinus rhythm.  Will continue metoprolol tartrate 25 mg twice daily.  CHA2DS2-VASc equals 4 (age, hypertension, female, diabetes).  She will start Eliquis 5 mg twice daily.  She describes chest discomfort which appears noncardiac however given new diagnosis we will pursue a Lexiscan nuclear medicine stress test.  Could consider coronary CTA however she is having PACs and this can be problematic.  We will start with a stress test.  She needs an echo.  I would like to recheck a TSH.  Her most recent hemoglobin is 2.5.  Hemoglobin 9.7 but no bleeding.  She does have dermatomyositis.  I see no contraindication to Eliquis.  We discussed a 14-day ZIO to determine her atrial fibrillation/flutter burden.  We also discussed monitoring her heart rate with the Apple watch.  We will get a  better idea of her burden with a monitor and then determine if rhythm control strategy is needed.  She may need ablation versus rhythm control medications.  This will all be determined by her burden.  4. Renovascular hypertension -Continue current medications.  5. Mixed hyperlipidemia -LDL at goal.   Informed Consent   Shared Decision Making/Informed Consent The risks [chest pain, shortness of breath, cardiac arrhythmias, dizziness, blood pressure fluctuations, myocardial infarction, stroke/transient ischemic attack, nausea, vomiting, allergic reaction, radiation exposure, metallic taste sensation and life-threatening complications (estimated to be 1 in 10,000)], benefits (risk stratification, diagnosing coronary artery disease, treatment guidance) and alternatives of a nuclear stress test were discussed in detail with Amber Bennett and she agrees to proceed.     Disposition: No follow-ups on file.  Medication Adjustments/Labs and Tests Ordered: Current medicines are reviewed at length with the patient today.  Concerns regarding medicines are outlined above.  Orders Placed This Encounter  Procedures   TSH   MYOCARDIAL PERFUSION IMAGING   LONG TERM MONITOR (3-14 DAYS)   EKG 12-Lead   ECHOCARDIOGRAM COMPLETE   Meds ordered this encounter  Medications   metoprolol tartrate (LOPRESSOR) 25 MG tablet    Sig: Take 1 tablet (25 mg total) by mouth 2 (two) times daily.    Dispense:  180 tablet    Refill:  3   apixaban (ELIQUIS) 5 MG TABS tablet    Sig: Take 1 tablet (5 mg total) by mouth 2 (two) times daily.    Dispense:  60 tablet    Refill:  3    Patient Instructions  Medication Instructions:  The current medical regimen is effective;  continue present plan and medications.  *If you need a refill on your cardiac medications before your next appointment, please call your pharmacy*   Lab Work: TSH today   If you have labs (blood work) drawn today and your tests are completely normal,  you will receive your results only by: MyChart Message (if you have MyChart) OR A paper copy in the mail If you have any lab test that is abnormal or we need to change your treatment, we will call you to review the results.   Testing/Procedures: Echocardiogram - Your physician has requested that you have an echocardiogram. Echocardiography is a painless test that uses sound waves to create images of your heart. It provides your doctor with information about the size and shape of your heart and how well your heart's chambers and valves are working. This procedure takes approximately one hour. There are no restrictions for this procedure.   Your physician has requested that you have a lexiscan myoview. For further information please visit [email or web address] Please follow instruction sheet, as given.   ZIO XT- Long Term Monitor Instructions  Your physician has requested you wear a ZIO patch monitor for 14 days.  This is a single patch monitor. [employer] supplies one patch monitor per enrollment. Additional stickers are not available. Please do not apply patch if you will be having a Nuclear Stress Test,  Echocardiogram, Cardiac CT, MRI, or Chest Xray during the period you would be wearing  the  monitor. The patch cannot be worn during these tests. You cannot remove and re-apply the  ZIO XT patch monitor.  Your ZIO patch monitor will be mailed 3 day USPS to your address on file. It may take 3-5 days  to receive your monitor after you have been enrolled.  Once you have received your monitor, please review the enclosed instructions. Your monitor  has already been registered assigning a specific monitor serial # to you.  Billing and Patient Assistance Program Information  We have supplied [employer] with any of your insurance information on file for billing purposes. [employer] offers a sliding scale Patient Assistance Program for patients that do not have  insurance, or whose insurance does not  completely cover the cost of the ZIO monitor.  You must apply for the Patient Assistance Program to qualify for this discounted rate.  To apply, please call [employer] at [phone], select option 4, select option 2, ask to apply for  Patient Assistance Program. Meredeth Ide will ask your household income, and how many people  are in your household. They will quote your out-of-pocket cost based on that information.  [employer] will also be able to set up a 40-month, interest-free payment plan if needed.  Applying the monitor   Shave hair from upper left chest.  Hold abrader disc by orange tab. Rub abrader in 40 strokes over the upper left chest as  indicated in your monitor instructions.  Clean area with 4 enclosed alcohol pads. Let dry.  Apply patch as indicated in monitor instructions. Patch will be placed under collarbone on left  side of chest with arrow pointing upward.  Rub patch adhesive wings for 2 minutes. Remove white label marked "1". Remove the white  label marked "2". Rub patch adhesive wings for 2 additional minutes.  While looking in a mirror, press and release button in center of patch. A small green light will  flash 3-4 times. This will be your only indicator that the monitor has been turned on.  Do not shower for the first 24 hours. You may shower after the first 24 hours.  Press the button if you feel a symptom. You will hear a small click. Record Date, Time and  Symptom in the Patient Logbook.  When you are ready to remove the patch, follow instructions on the last 2 pages of Patient  Logbook. Stick patch monitor onto the last page of Patient Logbook.  Place Patient Logbook in the blue and white box. Use locking tab on box and tape box closed  securely. The blue and white box has prepaid postage on it. Please place it in the mailbox as  soon as possible. Your physician should have your test results approximately 7 days after the  monitor has been mailed back to [facility].  Call  [facility] Customer Care at [phone] if you have questions regarding  your ZIO XT patch monitor. Call them immediately if you see an orange light blinking on your  monitor.  If your monitor falls off in less than 4 days, contact our Monitor department at [phone].  If your monitor becomes loose or falls off after 4 days call [employer] at [phone] for  suggestions on securing your monitor    Follow-Up: At [facility], you and your health needs are our priority.  As part of our continuing mission to provide you with exceptional heart care, we have created designated Provider Care Teams.  These Care Teams include your primary Cardiologist (physician) and  Advanced Practice Providers (APPs -  Physician Assistants and Nurse Practitioners) who all work together to provide you with the care you need, when you need it.  We recommend signing up for the patient portal called "MyChart".  Sign up information is provided on this After Visit Summary.  MyChart is used to connect with patients for Virtual Visits (Telemedicine).  Patients are able to view lab/test results, encounter notes, upcoming appointments, etc.  Non-urgent messages can be sent to your provider as well.   To learn more about what you can do with MyChart, go to [email or web address].    Your next appointment:   3 month(s)  Provider:   Lennie Odor, MD       Signed, Lenna Gilford. Flora Lipps, MD, [facility]  [facility]  [address], [address] [place], [place] [address] [phone]  12/12/2022 2:26 PM

## 2022-12-11 NOTE — Telephone Encounter (Signed)
Patient's husband returning call. 

## 2022-12-11 NOTE — Telephone Encounter (Signed)
Patient returned call- scheduled for an appointment tomorrow.   Patient verbalized understanding.

## 2022-12-12 ENCOUNTER — Encounter: Payer: Self-pay | Admitting: Cardiovascular Disease

## 2022-12-12 ENCOUNTER — Ambulatory Visit (INDEPENDENT_AMBULATORY_CARE_PROVIDER_SITE_OTHER): Payer: Medicare Other

## 2022-12-12 ENCOUNTER — Ambulatory Visit: Payer: Medicare Other | Attending: Cardiovascular Disease | Admitting: Cardiovascular Disease

## 2022-12-12 VITALS — BP 120/64 | HR 80 | Ht <= 58 in | Wt 154.8 lb

## 2022-12-12 DIAGNOSIS — R0602 Shortness of breath: Secondary | ICD-10-CM | POA: Diagnosis not present

## 2022-12-12 DIAGNOSIS — E782 Mixed hyperlipidemia: Secondary | ICD-10-CM | POA: Diagnosis not present

## 2022-12-12 DIAGNOSIS — I150 Renovascular hypertension: Secondary | ICD-10-CM | POA: Diagnosis not present

## 2022-12-12 DIAGNOSIS — I483 Typical atrial flutter: Secondary | ICD-10-CM | POA: Diagnosis not present

## 2022-12-12 DIAGNOSIS — R072 Precordial pain: Secondary | ICD-10-CM | POA: Insufficient documentation

## 2022-12-12 MED ORDER — APIXABAN 5 MG PO TABS: 5.0000 mg | ORAL_TABLET | Freq: Two times a day (BID) | ORAL | 3 refills | Status: DC

## 2022-12-12 MED ORDER — METOPROLOL TARTRATE 25 MG PO TABS: 25.0000 mg | ORAL_TABLET | Freq: Two times a day (BID) | ORAL | 3 refills | Status: DC

## 2022-12-12 NOTE — Progress Notes (Unsigned)
Enrolled patient for a 14 day Zio XT  monitor to be mailed to patients home  °

## 2022-12-12 NOTE — Patient Instructions (Signed)
Medication Instructions:  The current medical regimen is effective;  continue present plan and medications.  *If you need a refill on your cardiac medications before your next appointment, please call your pharmacy*   Lab Work: TSH today   If you have labs (blood work) drawn today and your tests are completely normal, you will receive your results only by: MyChart Message (if you have MyChart) OR A paper copy in the mail If you have any lab test that is abnormal or we need to change your treatment, we will call you to review the results.   Testing/Procedures: Echocardiogram - Your physician has requested that you have an echocardiogram. Echocardiography is a painless test that uses sound waves to create images of your heart. It provides your doctor with information about the size and shape of your heart and how well your heart's chambers and valves are working. This procedure takes approximately one hour. There are no restrictions for this procedure.   Your physician has requested that you have a lexiscan myoview. For further information please visit [email or web address] Please follow instruction sheet, as given.   ZIO XT- Long Term Monitor Instructions  Your physician has requested you wear a ZIO patch monitor for 14 days.  This is a single patch monitor. [employer] supplies one patch monitor per enrollment. Additional stickers are not available. Please do not apply patch if you will be having a Nuclear Stress Test,  Echocardiogram, Cardiac CT, MRI, or Chest Xray during the period you would be wearing the  monitor. The patch cannot be worn during these tests. You cannot remove and re-apply the  ZIO XT patch monitor.  Your ZIO patch monitor will be mailed 3 day USPS to your address on file. It may take 3-5 days  to receive your monitor after you have been enrolled.  Once you have received your monitor, please review the enclosed instructions. Your monitor  has already been registered  assigning a specific monitor serial # to you.  Billing and Patient Assistance Program Information  We have supplied [employer] with any of your insurance information on file for billing purposes. [employer] offers a sliding scale Patient Assistance Program for patients that do not have  insurance, or whose insurance does not completely cover the cost of the ZIO monitor.  You must apply for the Patient Assistance Program to qualify for this discounted rate.  To apply, please call [employer] at [phone], select option 4, select option 2, ask to apply for  Patient Assistance Program. Meredeth Ide will ask your household income, and how many people  are in your household. They will quote your out-of-pocket cost based on that information.  [employer] will also be able to set up a 14-month, interest-free payment plan if needed.  Applying the monitor   Shave hair from upper left chest.  Hold abrader disc by orange tab. Rub abrader in 40 strokes over the upper left chest as  indicated in your monitor instructions.  Clean area with 4 enclosed alcohol pads. Let dry.  Apply patch as indicated in monitor instructions. Patch will be placed under collarbone on left  side of chest with arrow pointing upward.  Rub patch adhesive wings for 2 minutes. Remove white label marked "1". Remove the white  label marked "2". Rub patch adhesive wings for 2 additional minutes.  While looking in a mirror, press and release button in center of patch. A small green light will  flash 3-4 times. This will be your only indicator that the monitor  has been turned on.  Do not shower for the first 24 hours. You may shower after the first 24 hours.  Press the button if you feel a symptom. You will hear a small click. Record Date, Time and  Symptom in the Patient Logbook.  When you are ready to remove the patch, follow instructions on the last 2 pages of Patient  Logbook. Stick patch monitor onto the last page of Patient Logbook.  Place  Patient Logbook in the blue and white box. Use locking tab on box and tape box closed  securely. The blue and white box has prepaid postage on it. Please place it in the mailbox as  soon as possible. Your physician should have your test results approximately 7 days after the  monitor has been mailed back to [facility].  Call [facility] Customer Care at [phone] if you have questions regarding  your ZIO XT patch monitor. Call them immediately if you see an orange light blinking on your  monitor.  If your monitor falls off in less than 4 days, contact our Monitor department at [phone].  If your monitor becomes loose or falls off after 4 days call [employer] at [phone] for  suggestions on securing your monitor    Follow-Up: At [facility], you and your health needs are our priority.  As part of our continuing mission to provide you with exceptional heart care, we have created designated Provider Care Teams.  These Care Teams include your primary Cardiologist (physician) and Advanced Practice Providers (APPs -  Physician Assistants and Nurse Practitioners) who all work together to provide you with the care you need, when you need it.  We recommend signing up for the patient portal called "MyChart".  Sign up information is provided on this After Visit Summary.  MyChart is used to connect with patients for Virtual Visits (Telemedicine).  Patients are able to view lab/test results, encounter notes, upcoming appointments, etc.  Non-urgent messages can be sent to your provider as well.   To learn more about what you can do with MyChart, go to [email or web address].    Your next appointment:   3 month(s)  Provider:   Lennie Odor, MD

## 2022-12-13 LAB — TSH: TSH: 1.09 u[IU]/mL (ref 0.450–4.500)

## 2022-12-16 ENCOUNTER — Telehealth: Payer: Self-pay | Admitting: Cardiovascular Disease

## 2022-12-16 ENCOUNTER — Other Ambulatory Visit (HOSPITAL_COMMUNITY): Payer: Self-pay

## 2022-12-16 DIAGNOSIS — I483 Typical atrial flutter: Secondary | ICD-10-CM | POA: Diagnosis not present

## 2022-12-16 NOTE — Telephone Encounter (Signed)
Per test claim plan requires Xarelto to be tried first before they will approve a billing on Eliquis   Is it appropriate to change this patient to Xarelto?   PLEASE ADVISE

## 2022-12-16 NOTE — Telephone Encounter (Signed)
What strength of Xarelto are we doing?

## 2022-12-16 NOTE — Telephone Encounter (Signed)
Called patient, she states that she is having issues with getting the Eliquis, she states her insurance is not approving it, advised I would route to our pharmacy team to see if there was issues with the Eliquis with the insurance, and if so if we can assist. Advised with her I would call back with an update. She is using the samples provided, advised patient to let us know if she was almost out and we could help provide more until we figure out what is going on with the prescription.   She states she has also been checking the EKG and it is showing up still a-fib, patient states she is still feeling okay, HR has been okay highest was 101. She has not starting wearing the heart monitor yet, advised for her to go ahead and start this, we will call with results when we get them in. Patient verbalized understanding.

## 2022-12-16 NOTE — Telephone Encounter (Signed)
Pt c/o medication issue:  1. Name of Medication: apixaban (ELIQUIS) 5 MG TABS tablet   2. How are you currently taking this medication (dosage and times per day)? As prescribed   3. Are you having a reaction (difficulty breathing--STAT)? No   4. What is your medication issue? Insurance is still not covering medication. She reports they are still requesting further information. Patient also states a-fib is still occurring on her watch while taking this medication.

## 2022-12-17 ENCOUNTER — Other Ambulatory Visit (HOSPITAL_COMMUNITY): Payer: Self-pay

## 2022-12-17 MED ORDER — RIVAROXABAN 20 MG PO TABS: 20.0000 mg | ORAL_TABLET | Freq: Every day | ORAL | 3 refills | Status: DC

## 2022-12-17 NOTE — Telephone Encounter (Signed)
Left message for patient to call back  

## 2022-12-17 NOTE — Telephone Encounter (Signed)
Pt is returning call.  

## 2022-12-17 NOTE — Addendum Note (Signed)
Addended by: Freddi Starr on: 12/17/2022 12:53 PM   Modules accepted: Orders

## 2022-12-17 NOTE — Telephone Encounter (Signed)
Spoke with pt, aware we are changing her to xarelto 20 mg once daily. New script sent to the pharmacy. She is aware to take with food.

## 2022-12-17 NOTE — Telephone Encounter (Signed)
Per test claim no PA required for Xarelto. Looks like patient's pharmacy has already filled it today. Pt can pick up whenever.

## 2022-12-17 NOTE — Telephone Encounter (Signed)
Called patient, LVM to call back number.

## 2023-01-01 ENCOUNTER — Telehealth: Payer: Self-pay | Admitting: Cardiovascular Disease

## 2023-01-01 NOTE — Telephone Encounter (Signed)
Call to patient and scheduled with DOD for Friday at [facility] office with Dr Bjorn Pippin. She is aware of location and time.  She will bring BP readings and any information from her watch.

## 2023-01-01 NOTE — Telephone Encounter (Signed)
Pt c/o medication issue:  1. Name of Medication: metoprolol tartrate (LOPRESSOR) 25 MG tablet   2. How are you currently taking this medication (dosage and times per day)?    3. Are you having a reaction (difficulty breathing--STAT)? no  4. What is your medication issue? Patient is calling in speak to the nurse about her medication and how its effect;ing her HR. Please advise

## 2023-01-01 NOTE — Telephone Encounter (Signed)
Patient states per her watch, she is in Afib every day through the day.  It is showing high HR 45- 135. She is anxious and fatigued, not even to be able to drive. She states something is not working. Going on vacation July 11 and worried as no hospital etc. Echo is July 22nd. Monitor returned yesterday.   Continues metoprolol tartrate 25 mg twice daily and Xarelto 20 mg Daily (eliquis not approved)  She is extremely anxious.  Advised to relax as much as she can and not worruy. Hydrate and we will get recommendations from provider

## 2023-01-02 NOTE — Progress Notes (Signed)
Cardiology Office Note:    Date:  01/03/2023   ID:  Amber Bennett, DOB [birth year], MRN [id number]  PCP:  Rodrigo Ran, MD  Cardiologist:  None  Electrophysiologist:  None   Referring MD: Rodrigo Ran, MD   Chief Complaint  Patient presents with   Atrial Fibrillation    History of Present Illness:    Amber Bennett is a 72 y.o. female with a hx of atrial flutter, hypertension, diabetes who is seen today for DOD visit.  She follows with Dr. Flora Lipps, last seen 12/12/2022.  She was seen by her neurologist on 12/11/2022 noted to be in atrial flutter with heart rate in 120s.  At follow-up with Dr. Flora Lipps the following day was in sinus rhythm.  She was continued on metoprolol 25 mg twice daily and Eliquis 5 mg twice daily.  Echocardiogram and Zio x 14 days were ordered.  In addition she was reporting chest pain and Lexiscan Myoview was ordered.  Since last clinic visit, she reports she continues have multiple episodes of high heart rate on her watch, up to 120s.  Also had 1 episode of chest tightness on left side of chest that lasted for about 30 minutes.  Does report some lightheadedness but denies any syncope.  Reports this morning she has been having dysuria and urinary frequency, reports feels similar to prior UTIs.   BP Readings from Last 3 Encounters:  01/03/23 112/62  12/12/22 120/64  12/11/22 (!) 145/91     Past Medical History:  Diagnosis Date   Atherosclerosis of aorta (HCC)    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   Carpal tunnel syndrome    Cervical radiculitis    Depression    Dermatomyositis (HCC)    Diabetes mellitus without complication (HCC)    Elevated liver enzymes    External hemorrhoids    Fatty liver    Fibromyalgia affecting hand 08/2022   bilateral wrist and hands   Hematuria    negative evaluation   High cholesterol    HTN (hypertension)    Internal hemorrhoids    Laryngitis    Lower back pain    Macular degeneration    Osteopenia    Left side hip    Seizures (HCC)    09/07/2019 pt reports last seizure in 2014   Tubular adenoma of colon     Past Surgical History:  Procedure Laterality Date   ABDOMINAL HYSTERECTOMY  1982   APPENDECTOMY     BACK SURGERY     BACK SURGERY  07/2015   CARPAL TUNNEL RELEASE     CATARACT EXTRACTION, BILATERAL     CHOLECYSTECTOMY  2003   COLONOSCOPY     EXPLORATORY LAPAROTOMY     LAMINECTOMY     SHOULDER SURGERY Right 2000   TONSILLECTOMY     TOTAL HIP ARTHROPLASTY Right 02/12/2022   Procedure: RIGHT TOTAL HIP ARTHROPLASTY ANTERIOR APPROACH;  Surgeon: Kathryne Hitch, MD;  Location: [facility] OR;  Service: Orthopedics;  Laterality: Right;    Current Medications: Current Meds  Medication Sig   amLODipine (NORVASC) 2.5 MG tablet Take 2.5 mg by mouth at bedtime.   BIOTIN PO Take 1 tablet by mouth daily.   Calcium Carb-Cholecalciferol (CALTRATE 600+D3 PO) Take 1 tablet by mouth daily.   cephALEXin (KEFLEX) 500 MG capsule Take 1 capsule (500 mg total) by mouth 2 (two) times daily. For 7 days   Cholecalciferol (VITAMIN D) 50 MCG (2000 UT) tablet Take 2,000 Units by mouth daily.  Coenzyme Q10 (CO Q 10 PO) Take 300 mg by mouth daily.   diphenoxylate-atropine (LOMOTIL) 2.5-0.025 MG tablet Take 1 tablet by mouth 4 (four) times daily as needed for diarrhea or loose stools.   DULoxetine (CYMBALTA) 30 MG capsule Take 30 mg by mouth every evening.   ezetimibe (ZETIA) 10 MG tablet Take 10 mg by mouth every evening.   gabapentin (NEURONTIN) 800 MG tablet Take 1 tablet (800 mg total) by mouth 2 (two) times daily.   hyoscyamine (LEVSIN SL) 0.125 MG SL tablet Place 1 tablet (0.125 mg total) under the tongue every 6 (six) hours as needed.   loratadine (CLARITIN) 10 MG tablet Take 10 mg by mouth daily.   montelukast (SINGULAIR) 10 MG tablet Take 10 mg by mouth at bedtime.   Multiple Vitamins-Minerals (MULTIVITAMIN PO) Take 1 tablet by mouth daily.   Multiple Vitamins-Minerals (PRESERVISION AREDS PO) Take 1 capsule by  mouth daily.   omega-3 acid ethyl esters (LOVAZA) 1 G capsule Take 2 g by mouth 2 (two) times daily.   OZEMPIC 1 MG/DOSE SOPN Inject 1 mg into the skin every Friday.   PHENobarbital (LUMINAL) 97.2 MG tablet TAKE 1 AND 1/2 TABLETS BY MOUTH EVERY DAY   potassium chloride (KLOR-CON) 20 MEQ packet Take 40 mEq by mouth daily.   potassium chloride SA (KLOR-CON M) 20 MEQ tablet Take 60 mEq by mouth daily.   PRALUENT 150 MG/ML SOAJ Inject 150 mg into the skin every 14 (fourteen) days.   Probiotic Product (PROBIOTIC PO) Take 1 capsule by mouth daily.   pyridoxine (B-6) 100 MG tablet Take 100 mg by mouth daily.   rivaroxaban (XARELTO) 20 MG TABS tablet Take 1 tablet (20 mg total) by mouth daily with supper.   sodium chloride (OCEAN) 0.65 % SOLN nasal spray Place 1 spray into both nostrils as needed for congestion.   telmisartan (MICARDIS) 40 MG tablet Take 40 mg by mouth daily.    tiZANidine (ZANAFLEX) 4 MG tablet Take 1 tablet (4 mg total) by mouth 2 (two) times daily as needed for muscle spasms.   topiramate (TOPAMAX) 50 MG tablet TAKE 1 TABLET BY MOUTH EVERY MORNING AND 2 TABLETS EVERY EVENING   vitamin E 400 UNIT capsule Take 400 Units by mouth daily.   [DISCONTINUED] metoprolol tartrate (LOPRESSOR) 25 MG tablet Take 1 tablet (25 mg total) by mouth 2 (two) times daily.     Allergies:   Tylenol [acetaminophen] and Latex   Social History   Socioeconomic History   Marital status: Married    Spouse name: Not on file   Number of children: 2   Years of education: 14   Highest education level: Not on file  Occupational History   Occupation: Employed    Comment: Works for [employer], [employer] and [employer]  Tobacco Use   Smoking status: Former    Types: Cigarettes    Quit date: 07/01/1978    Years since quitting: 44.5   Smokeless tobacco: Never  Vaping Use   Vaping Use: Never used  Substance and Sexual Activity   Alcohol use: Not Currently    Alcohol/week: 7.0 standard drinks of alcohol     Types: 7 Glasses of wine per week   Drug use: No   Sexual activity: Yes    Partners: Male    Birth control/protection: Surgical    Comment: TAH  Other Topics Concern   Not on file  Social History Narrative   Patient lives at home with her husband Cloyd Stagers).  Retired.   Education two years of business college.   Right handed.   Caffeine     Social Determinants of Health   Financial Resource Strain: Not on file  Food Insecurity: Not on file  Transportation Needs: Not on file  Physical Activity: Not on file  Stress: Not on file  Social Connections: Not on file     Family History: The patient's family history includes Cancer in her mother; Colon cancer in her maternal aunt; Diabetes in her sister; Heart attack in her mother; Hypertension in her sister; Stroke in her sister. There is no history of Seizures, Esophageal cancer, Rectal cancer, or Stomach cancer.  ROS:   Please see the history of present illness.     All other systems reviewed and are negative.  EKGs/Labs/Other Studies Reviewed:    The following studies were reviewed today:   EKG:  01/03/23: Atrial fibrillation, rate 100  Recent Labs: 02/05/2022: ALT 82 02/13/2022: BUN 8; Creatinine, Ser 0.54; Hemoglobin 9.7; Platelets 133; Potassium 4.7; Sodium 137 12/12/2022: TSH 1.090  Recent Lipid Panel No results found for: "CHOL", "TRIG", "HDL", "CHOLHDL", "VLDL", "LDLCALC", "LDLDIRECT"  Physical Exam:    VS:  BP 112/62 (BP Location: Left Arm, Patient Position: Sitting, Cuff Size: Normal)   Pulse 100   Ht 4\' 10"  (1.473 m)   Wt 157 lb 14.4 oz (71.6 kg)   LMP 07/01/1980   BMI 33.00 kg/m     Wt Readings from Last 3 Encounters:  01/03/23 157 lb 14.4 oz (71.6 kg)  12/12/22 154 lb 12.8 oz (70.2 kg)  12/11/22 158 lb 6.4 oz (71.8 kg)     GEN:  Well nourished, well developed in no acute distress HEENT: Normal NECK: No JVD; No carotid bruits LYMPHATICS: No lymphadenopathy CARDIAC: Irregular, tachycardic no murmurs,  rubs, gallops RESPIRATORY:  Clear to auscultation without rales, wheezing or rhonchi  ABDOMEN: Soft, non-tender, non-distended MUSCULOSKELETAL:  No edema; No deformity  SKIN: Warm and dry NEUROLOGIC:  Alert and oriented x 3 PSYCHIATRIC:  Normal affect   ASSESSMENT:    1. Paroxysmal atrial fibrillation (HCC)   2. Dysuria   3. SOB (shortness of breath) on exertion   4. Daytime somnolence    PLAN:    Atrial fibrillation/flutter: She was seen by her neurologist on 12/11/2022, noted to be in atrial flutter with heart rate in 120s.  At follow-up with Dr. Flora Lipps the following day was in sinus rhythm.  She is in A-fib in clinic today, heart rate 100s.  Reports has been having episodes of A-fib with heart rates to 120s on her watch. -Increase metoprolol to 50 mg twice daily for better rate control -Continue Xarelto 20 mg daily.  CHA2DS2-VASc 4 (hypertension, age, diabetes, female) -Echocardiogram ordered -Zio x 14 days pending -Check sleep study  Chest pain: atypical in description, Lexiscan Myoview was ordered.  Daytime somnolence: Recommend Itamar sleep study to evaluate for OSA.  May be contributing to A-fib as above.  STOP-BANG 3  UTI: Reports having dysuria/urinary frequency.  She called PCP, they will send in a prescription for antibiotic.  Asked that we check UA/urine culture in clinic today, will order  RTC in A-fib clinic in 3 weeks  Medication Adjustments/Labs and Tests Ordered: Current medicines are reviewed at length with the patient today.  Concerns regarding medicines are outlined above.  Orders Placed This Encounter  Procedures   Urine Culture   Urinalysis, Routine w reflex microscopic   EKG 12-Lead   Itamar Sleep Study   Meds  ordered this encounter  Medications   metoprolol tartrate (LOPRESSOR) 50 MG tablet    Sig: Take 1 tablet (50 mg total) by mouth 2 (two) times daily.    Dispense:  180 tablet    Refill:  3   cephALEXin (KEFLEX) 500 MG capsule    Sig: Take 1  capsule (500 mg total) by mouth 2 (two) times daily. For 7 days    Dispense:  14 capsule    Refill:  0    Patient Instructions  Medication Instructions:  Start Keflex 500 mg twice a day for 7 days Increase Metoprolol to 50 mg twice a day Continue all other medications *If you need a refill on your cardiac medications before your next appointment, please call your pharmacy*   Lab Work: Cbc,cmet,urinalysis,urine culture today   Testing/Procedures: Itamar Sleep Study   Follow-Up: At [facility], you and your health needs are our priority.  As part of our continuing mission to provide you with exceptional heart care, we have created designated Provider Care Teams.  These Care Teams include your primary Cardiologist (physician) and Advanced Practice Providers (APPs -  Physician Assistants and Nurse Practitioners) who all work together to provide you with the care you need, when you need it.  We recommend signing up for the patient portal called "MyChart".  Sign up information is provided on this After Visit Summary.  MyChart is used to connect with patients for Virtual Visits (Telemedicine).  Patients are able to view lab/test results, encounter notes, upcoming appointments, etc.  Non-urgent messages can be sent to your provider as well.   To learn more about what you can do with MyChart, go to [email or web address].    Your next appointment:  Keep appointment 9/19 at 2:20 pm    Provider:  Dr.O'Neal     Appointment to be scheduled in AFib Clinic     Signed, Little Ishikawa, MD  01/03/2023 6:16 PM    [facility]

## 2023-01-03 ENCOUNTER — Ambulatory Visit (INDEPENDENT_AMBULATORY_CARE_PROVIDER_SITE_OTHER): Payer: Medicare Other | Admitting: Cardiology

## 2023-01-03 ENCOUNTER — Telehealth (HOSPITAL_BASED_OUTPATIENT_CLINIC_OR_DEPARTMENT_OTHER): Payer: Self-pay

## 2023-01-03 ENCOUNTER — Encounter (HOSPITAL_BASED_OUTPATIENT_CLINIC_OR_DEPARTMENT_OTHER): Payer: Self-pay | Admitting: Cardiology

## 2023-01-03 VITALS — BP 112/62 | HR 100 | Ht <= 58 in | Wt 157.9 lb

## 2023-01-03 DIAGNOSIS — R0602 Shortness of breath: Secondary | ICD-10-CM

## 2023-01-03 DIAGNOSIS — R300 Dysuria: Secondary | ICD-10-CM | POA: Diagnosis not present

## 2023-01-03 DIAGNOSIS — R400 Somnolence: Secondary | ICD-10-CM

## 2023-01-03 DIAGNOSIS — I480 Paroxysmal atrial fibrillation: Secondary | ICD-10-CM | POA: Diagnosis not present

## 2023-01-03 DIAGNOSIS — I483 Typical atrial flutter: Secondary | ICD-10-CM | POA: Diagnosis not present

## 2023-01-03 MED ORDER — CEPHALEXIN 500 MG PO CAPS: 500.0000 mg | ORAL_CAPSULE | Freq: Two times a day (BID) | ORAL | 0 refills | Status: DC

## 2023-01-03 MED ORDER — METOPROLOL TARTRATE 50 MG PO TABS: 50.0000 mg | ORAL_TABLET | Freq: Two times a day (BID) | ORAL | 3 refills | Status: DC

## 2023-01-03 NOTE — Patient Instructions (Signed)
Medication Instructions:  Start Keflex 500 mg twice a day for 7 days Increase Metoprolol to 50 mg twice a day Continue all other medications *If you need a refill on your cardiac medications before your next appointment, please call your pharmacy*   Lab Work: Cbc,cmet,urinalysis,urine culture today   Testing/Procedures: Itamar Sleep Study   Follow-Up: At [facility], you and your health needs are our priority.  As part of our continuing mission to provide you with exceptional heart care, we have created designated Provider Care Teams.  These Care Teams include your primary Cardiologist (physician) and Advanced Practice Providers (APPs -  Physician Assistants and Nurse Practitioners) who all work together to provide you with the care you need, when you need it.  We recommend signing up for the patient portal called "MyChart".  Sign up information is provided on this After Visit Summary.  MyChart is used to connect with patients for Virtual Visits (Telemedicine).  Patients are able to view lab/test results, encounter notes, upcoming appointments, etc.  Non-urgent messages can be sent to your provider as well.   To learn more about what you can do with MyChart, go to [email or web address].    Your next appointment:  Keep appointment 9/19 at 2:20 pm    Provider:  Dr.O'Neal     Appointment to be scheduled in AFib Clinic

## 2023-01-03 NOTE — Telephone Encounter (Signed)
01/03/2023(late entry) During office visit today, patient's husband stated they had Regular Medicare with BCBS supplement.  No prior auth required for WatchPat One. Jim Like MHA RN CCM

## 2023-01-04 ENCOUNTER — Encounter (INDEPENDENT_AMBULATORY_CARE_PROVIDER_SITE_OTHER): Payer: Medicare Other | Admitting: Cardiology

## 2023-01-04 DIAGNOSIS — G4733 Obstructive sleep apnea (adult) (pediatric): Secondary | ICD-10-CM | POA: Diagnosis not present

## 2023-01-06 ENCOUNTER — Other Ambulatory Visit (HOSPITAL_BASED_OUTPATIENT_CLINIC_OR_DEPARTMENT_OTHER): Payer: Self-pay | Admitting: *Deleted

## 2023-01-06 ENCOUNTER — Telehealth (HOSPITAL_BASED_OUTPATIENT_CLINIC_OR_DEPARTMENT_OTHER): Payer: Self-pay | Admitting: Cardiology

## 2023-01-06 ENCOUNTER — Ambulatory Visit: Payer: Medicare Other | Attending: Cardiology

## 2023-01-06 DIAGNOSIS — R400 Somnolence: Secondary | ICD-10-CM

## 2023-01-06 DIAGNOSIS — I480 Paroxysmal atrial fibrillation: Secondary | ICD-10-CM

## 2023-01-06 NOTE — Telephone Encounter (Signed)
Patient would like a call back with the urinalysis results from Friday 01/06/23 (ordered by Dr. Bjorn Pippin)

## 2023-01-06 NOTE — Telephone Encounter (Signed)
Spoke with patient regarding the Tuesday 01/28/23 2:00 pm appointment with the A Fib clinic---arrival time is 1:45 pm for check in on the 6th floor of [facility]---patient voiced her understanding.

## 2023-01-06 NOTE — Telephone Encounter (Signed)
Left message fo patient to call and discuss appointment with the A Fib clinic (as requessted by Dr. Bjorn Pippin)

## 2023-01-06 NOTE — Telephone Encounter (Signed)
Spoke to patient advised results are not available.Advised Lab tech will check on results tomorrow and I will call her back.Stated she is taking antibiotics as prescribed by PCP.

## 2023-01-06 NOTE — Procedures (Signed)
SLEEP STUDY REPORT Patient Information Study Date: 01/04/2023 Patient Name: Amber Bennett Patient ID: [id number] Birth Date: [birth year] Age: 72 Gender: Female BMI: 31.6 (W=156 lb, H=4' 11'') Referring Physician: Epifanio Lesches, MD  TEST DESCRIPTION: Home sleep apnea testing was completed using the WatchPat, a Type 1 device, utilizing peripheral arterial tonometry (PAT), chest movement, actigraphy, pulse oximetry, pulse rate, body position and snore. AHI was calculated with apnea and hypopnea using valid sleep time as the denominator. RDI includes apneas, hypopneas, and RERAs. The data acquired and the scoring of sleep and all associated events were performed in accordance with the recommended standards and specifications as outlined in the AASM Manual for the Scoring of Sleep and Associated Events 2.2.0 (2015).   FINDINGS:   1. Mild Obstructive Sleep Apnea with AHI 11.5/hr.   2. No Central Sleep Apnea with pAHIc 0.2/hr.   3. Oxygen desaturations as low as 70%.   4. Mild snoring was present. O2 sats were < 88% for 20.3 min.   5. Total sleep time was 9 hrs and 2 min.   6. 16.7% of total sleep time was spent in REM sleep.   7. Normal sleep onset latency at 17 min.   8. Prolonged REM sleep onset latency at 173 min.   9. Total awakenings were 6.  10. Arrhythmia detection:  Suggestive of possible brief atrial fibrillation lasting 6 hrs 2 min and 43seconds.  This is not diagnostic and further testing with outpatient telemetry monitoring is recommended.  DIAGNOSIS: Mild Obstructive Sleep Apnea (G47.33) Nocturnal Hyopxemia Possible Atrial Fibrillation  RECOMMENDATIONS:   1.  Clinical correlation of these findings is necessary.  The decision to treat obstructive sleep apnea (OSA) is usually based on the presence of apnea symptoms or the presence of associated medical conditions such as Hypertension, Congestive Heart Failure, Atrial Fibrillation or Obesity.  The most common  symptoms of OSA are snoring, gasping for breath while sleeping, daytime sleepiness and fatigue.   2.  Initiating apnea therapy is recommended given the presence of symptoms and/or associated conditions. Recommend proceeding with one of the following:     a.  Auto-CPAP therapy with a pressure range of 5-20cm H2O.     b.  An oral appliance (OA) that can be obtained from certain dentists with expertise in sleep medicine.  These are primarily of use in non-obese patients with mild and moderate disease.     c.  An ENT consultation which may be useful to look for specific causes of obstruction and possible treatment options.     d.  If patient is intolerant to PAP therapy, consider referral to ENT for evaluation for hypoglossal nerve stimulator.   3.  Close follow-up is necessary to ensure success with CPAP or oral appliance therapy for maximum benefit.  4.  A follow-up oximetry study on CPAP is recommended to assess the adequacy of therapy and determine the need for supplemental oxygen or the potential need for Bi-level therapy.  An arterial blood gas to determine the adequacy of baseline ventilation and oxygenation should also be considered.  5.  Healthy sleep recommendations include:  adequate nightly sleep (normal 7-9 hrs/night), avoidance of caffeine after noon and alcohol near bedtime, and maintaining a sleep environment that is cool, dark and quiet.  6.  Weight loss for overweight patients is recommended.  Even modest amounts of weight loss can significantly improve the severity of sleep apnea.  7.  Snoring recommendations include:  weight loss where appropriate, side  sleeping, and avoidance of alcohol before bed.  8.  Operation of motor vehicle should be avoided when sleepy.  Signature:   Armanda Magic, MD; [facility]; Diplomat, American Board of Sleep Medicine Electronically Signed: 01/06/2023 8:05:54 AM

## 2023-01-07 LAB — URINALYSIS, ROUTINE W REFLEX MICROSCOPIC
Bilirubin, UA: NEGATIVE
Glucose, UA: NEGATIVE
Ketones, UA: NEGATIVE
Nitrite, UA: NEGATIVE
Specific Gravity, UA: 1.013 (ref 1.005–1.030)
Urobilinogen, Ur: 0.2 mg/dL (ref 0.2–1.0)
pH, UA: 8 — ABNORMAL HIGH (ref 5.0–7.5)

## 2023-01-07 LAB — MICROSCOPIC EXAMINATION
Bacteria, UA: NONE SEEN
Casts: NONE SEEN /lpf
RBC, Urine: >30 /hpf — AB (ref 0–2)
WBC, UA: >30 /hpf — AB (ref 0–5)

## 2023-01-07 LAB — URINE CULTURE

## 2023-01-07 NOTE — Telephone Encounter (Signed)
Spoke to patient advised we received results of u/a and urine culture.Advised both were abnormal.Advised continue antibiotics.Advised to call PCP Dr.Perini.Results faxed to Dr.Perini.

## 2023-01-10 ENCOUNTER — Other Ambulatory Visit: Payer: Self-pay | Admitting: Internal Medicine

## 2023-01-10 DIAGNOSIS — I483 Typical atrial flutter: Secondary | ICD-10-CM | POA: Diagnosis not present

## 2023-01-10 DIAGNOSIS — R9389 Abnormal findings on diagnostic imaging of other specified body structures: Secondary | ICD-10-CM

## 2023-01-10 DIAGNOSIS — R0602 Shortness of breath: Secondary | ICD-10-CM

## 2023-01-12 ENCOUNTER — Other Ambulatory Visit: Payer: Self-pay | Admitting: Cardiovascular Disease

## 2023-01-12 MED ORDER — DRONEDARONE HCL 400 MG PO TABS: 400.0000 mg | ORAL_TABLET | Freq: Two times a day (BID) | ORAL | 1 refills | Status: DC

## 2023-01-12 NOTE — Progress Notes (Signed)
Multaq ordered for Afib/flutter.   Gerri Spore T. Flora Lipps, MD, [facility]  [facility]  [address], [address] [place], [place] [address] [phone]  2:22 PM

## 2023-01-13 ENCOUNTER — Telehealth: Payer: Self-pay | Admitting: *Deleted

## 2023-01-13 ENCOUNTER — Telehealth: Payer: Self-pay | Admitting: Cardiology

## 2023-01-13 DIAGNOSIS — I480 Paroxysmal atrial fibrillation: Secondary | ICD-10-CM

## 2023-01-13 DIAGNOSIS — I483 Typical atrial flutter: Secondary | ICD-10-CM

## 2023-01-13 MED ORDER — MULTAQ 400 MG PO TABS: 400.0000 mg | ORAL_TABLET | Freq: Two times a day (BID) | ORAL | 0 refills | Status: DC

## 2023-01-13 NOTE — Telephone Encounter (Signed)
LMOVM for patient. Main office number provided.   Pt returned call and confirmed that Dr. Waynard Edwards prescribed a 7 day course of Bactrim for her UTI, which she completed last night. She states she feels much better now. Pt verbalized appreciation of call and denies questions a this time.

## 2023-01-13 NOTE — Telephone Encounter (Signed)
Mrs. Cotterman:   Your monitor shows that you are having quite a bit of atrial fibrillation and flutter.  I would like to start you on a rhythm medication.  That medication is called Multaq.  This will help keep you in rhythm and lessen your burden of arrhythmia.  I also would like for you to see one of our cardiologist who specializes in the electrical aspects of the heart.  I think he would be a good candidate for something called an ablation.  This is a procedure where they can map the heart signal that is abnormal and get rid of it.  This is a very good option to control this.  You should continue your blood thinner as prescribed.  Please complete your stress test and ultrasound this will be helpful to fully evaluate your heart.  However, in the meantime we can go ahead and start this new medication.  I will call this into your pharmacy today.  Please let me know if you have any questions or concerns.  You will also continue the metoprolol as well.   Gerri Spore T. Flora Lipps, MD, [facility]  [facility] [address], [address] [place], [place] [address] [phone] 2:17 PM

## 2023-01-13 NOTE — Telephone Encounter (Signed)
-----   Message from Lenna Gilford [place] sent at 01/12/2023  2:23 PM EDT ----- Monitor shows 48% atrial fibrillation/flutter burden.  I would like to start her on Multaq 400 mg twice daily.  I will send her a MyChart message about this.  I would also like to refer her to electrophysiology for ablation consideration.  She should have a stress test and echocardiogram in the next week or so.  She is on Xarelto.  I will send a MyChart message.  Gerri Spore T. Flora Lipps, MD, [facility]  [facility]  [address] [place], [place] [address] [phone]  2:16 PM

## 2023-01-13 NOTE — Telephone Encounter (Addendum)
The patient has been notified of the result and verbalized understanding.  All questions (if any) were answered.   Patient wanted prescription sent to [facility]  in [place]. 30 day supply  Referral placed for EP  Loleta Chance 01/13/2023 2:29 PM

## 2023-01-13 NOTE — Telephone Encounter (Signed)
Patient states she was returning a call. Please advise  

## 2023-01-16 ENCOUNTER — Telehealth (HOSPITAL_COMMUNITY): Payer: Self-pay | Admitting: *Deleted

## 2023-01-16 ENCOUNTER — Telehealth: Payer: Self-pay

## 2023-01-16 NOTE — Telephone Encounter (Signed)
Left VM, per DPR, with callback number for sleep study results and recommendations.

## 2023-01-16 NOTE — Telephone Encounter (Signed)
Left message on voicemail per DPR in reference to upcoming appointment scheduled on 01/20/2023 at 10:30 with detailed instructions given per Myocardial Perfusion Study Information Sheet for the test. LM to arrive 15 minutes early, and that it is imperative to arrive on time for appointment to keep from having the test rescheduled. If you need to cancel or reschedule your appointment, please call the office within 24 hours of your appointment. Failure to do so may result in a cancellation of your appointment, and a $50 no show fee. Phone number given for call back for any questions.

## 2023-01-17 DIAGNOSIS — I10 Essential (primary) hypertension: Secondary | ICD-10-CM | POA: Diagnosis not present

## 2023-01-17 DIAGNOSIS — R569 Unspecified convulsions: Secondary | ICD-10-CM | POA: Diagnosis not present

## 2023-01-17 DIAGNOSIS — I440 Atrioventricular block, first degree: Secondary | ICD-10-CM | POA: Diagnosis not present

## 2023-01-17 DIAGNOSIS — E785 Hyperlipidemia, unspecified: Secondary | ICD-10-CM | POA: Diagnosis not present

## 2023-01-17 DIAGNOSIS — I4892 Unspecified atrial flutter: Secondary | ICD-10-CM | POA: Diagnosis not present

## 2023-01-17 DIAGNOSIS — E876 Hypokalemia: Secondary | ICD-10-CM | POA: Diagnosis not present

## 2023-01-17 DIAGNOSIS — D649 Anemia, unspecified: Secondary | ICD-10-CM | POA: Diagnosis not present

## 2023-01-17 DIAGNOSIS — R0600 Dyspnea, unspecified: Secondary | ICD-10-CM | POA: Diagnosis not present

## 2023-01-17 DIAGNOSIS — E871 Hypo-osmolality and hyponatremia: Secondary | ICD-10-CM | POA: Diagnosis not present

## 2023-01-20 ENCOUNTER — Ambulatory Visit (HOSPITAL_BASED_OUTPATIENT_CLINIC_OR_DEPARTMENT_OTHER): Payer: Medicare Other

## 2023-01-20 ENCOUNTER — Ambulatory Visit
Admission: RE | Admit: 2023-01-20 | Discharge: 2023-01-20 | Disposition: A | Discharge status: Home / Self Care | Payer: Medicare Other | Source: Ambulatory Visit | Attending: Internal Medicine | Admitting: Internal Medicine

## 2023-01-20 ENCOUNTER — Ambulatory Visit (HOSPITAL_COMMUNITY): Payer: Medicare Other | Attending: Cardiovascular Disease

## 2023-01-20 DIAGNOSIS — R0602 Shortness of breath: Secondary | ICD-10-CM

## 2023-01-20 DIAGNOSIS — I5041 Acute combined systolic (congestive) and diastolic (congestive) heart failure: Secondary | ICD-10-CM | POA: Diagnosis not present

## 2023-01-20 DIAGNOSIS — R072 Precordial pain: Secondary | ICD-10-CM

## 2023-01-20 DIAGNOSIS — R9389 Abnormal findings on diagnostic imaging of other specified body structures: Secondary | ICD-10-CM

## 2023-01-20 LAB — MYOCARDIAL PERFUSION IMAGING
LV dias vol: 59 mL (ref 46–106)
LV sys vol: 18 mL
Nuc Stress EF: 69 %
Peak HR: 78 {beats}/min
Rest HR: 68 {beats}/min
Rest Nuclear Isotope Dose: 10.9 mCi
SDS: 6
SRS: 7
SSS: 13
ST Depression (mm): 0 mm
Stress Nuclear Isotope Dose: 31.8 mCi
TID: 1.06

## 2023-01-20 LAB — ECHOCARDIOGRAM COMPLETE
Area-P 1/2: 4.81 cm2
P 1/2 time: 537 msec
S' Lateral: 2.8 cm

## 2023-01-20 MED ORDER — METOPROLOL TARTRATE 50 MG PO TABS: 75.0000 mg | ORAL_TABLET | Freq: Two times a day (BID) | ORAL | Status: DC

## 2023-01-20 MED ORDER — REGADENOSON 0.4 MG/5ML IV SOLN
0.4000 mg | Freq: Once | INTRAVENOUS | Status: AC
  Administered 2023-01-20: 0.4 mg via INTRAVENOUS

## 2023-01-20 MED ORDER — TECHNETIUM TC 99M TETROFOSMIN IV KIT
31.8000 | PACK | Freq: Once | INTRAVENOUS | Status: AC | PRN
  Administered 2023-01-20: 31.8 via INTRAVENOUS

## 2023-01-20 MED ORDER — FUROSEMIDE 20 MG PO TABS: 40.0000 mg | ORAL_TABLET | Freq: Every day | ORAL | Status: DC

## 2023-01-20 MED ORDER — TECHNETIUM TC 99M TETROFOSMIN IV KIT
10.9000 | PACK | Freq: Once | INTRAVENOUS | Status: AC | PRN
  Administered 2023-01-20: 10.9 via INTRAVENOUS

## 2023-01-20 NOTE — Progress Notes (Signed)
Cardiology Office Note:  .   Date:  01/24/2023  ID:  Amber Bennett, DOB August 16, 1950, MRN [id number] PCP: Amber Ran, MD  [facility] Providers Cardiologist:  None    History of Present Illness: Amber Bennett Kitchen   Amber Bennett is a 72 y.o. female with recent onset paroxysmal atrial fibrillation with rapid ventricular response complicated by acute exacerbation of diastolic heart failure on a background history of hypertension, hypercholesterolemia, mild obesity, recently diagnosed obstructive sleep apnea.  Multaq was started to help with very frequent episodes of atrial fibrillation with RVR, but has not really had any positive impact on the overall burden of arrhythmia or her symptoms.  While she was at the beach last week she felt quite short of breath and had symptoms suggestive of orthopnea.  She came back from the beach early.  She has improved after taking a higher dose of furosemide.  She no longer has orthopnea.  She feels well today.  No longer has edema.  A little bit dizzy when she stands up.  Her watch continues to show frequent episodes of tachyarrhythmia with rates in the 120s.  Will have episodes of chest tightness at rest during the rapid arrhythmia, but does not have exertional chest tightness.  She underwent an echocardiogram and a Lexiscan Myoview earlier this week with encouraging findings.  She has normal left ventricular systolic function.  Diastolic function assessment was limited due to the arrhythmia but the mitral annulus velocities are not bad (medial E prime 8.3, lateral E prime 9.76).  She does not have significant left ventricular hypertrophy.  The left atrium is mildly dilated (end-systolic diameter 4.3 cm, biplane end-systolic volume index 34 mL/m).  There was no evidence of ischemia on her nuclear images.  The home sleep study was abnormal (mild OSA with AHI 11.5/hour, but with oxygen desaturation as low as 70%), but she has yet to be scheduled for the CPAP titration  testing.  ROS: Continues to have problems with palpitations, exertional dyspnea, has chest tightness associated with the episodes of atrial fibrillation.  Studies Reviewed: .        Echocardiogram 01/20/2023  1. Left ventricular ejection fraction, by estimation, is 60 to 65%. The  left ventricle has normal function. The left ventricle has no regional  wall motion abnormalities. There is mild asymmetric left ventricular  hypertrophy of the basal-septal segment.  Left ventricular diastolic parameters are indeterminate.   2. Right ventricular systolic function is normal. The right ventricular  size is normal.   3. Left atrial size was mildly dilated.   4. The mitral valve is grossly normal. Mild mitral valve regurgitation.  No evidence of mitral stenosis.   5. The aortic valve is tricuspid. Aortic valve regurgitation is mild.   6. The inferior vena cava is normal in size with greater than 50%  respiratory variability, suggesting right atrial pressure of 3 mmHg.   Comparison(s): No prior Echocardiogram.   Lexiscan Myoview 01/20/2023:   No ischemia. There is a small mild fixed mid-anterior defect that is most consistent with breast attenuation given normal wall motion. The study is overall low risk due to grossly normal perfusion, normal LV function and no TID. Patient was symptomatic after stress testing, see separate documentation from Amber Bennett. If clinical concern persists, consider cardiac PET-CT with myocardial blood flow or coronary CTA.   No ST deviation was noted.   LV perfusion is abnormal. Defect 1: There is a small defect with mild reduction in uptake present in  the mid anterior location(s) that is fixed. There is normal wall motion in the defect area. Consistent with artifact caused by breast attenuation.   Left ventricular function is normal. Nuclear stress EF: 69%. The left ventricular ejection fraction is hyperdynamic (>65%). End diastolic cavity size is normal. End  systolic cavity size is normal.   Prior study not available for comparison.  Risk Assessment/Calculations:    CHA2DS2-VASc Score = 4   This indicates a 4.8% annual risk of stroke. The patient's score is based upon: CHF History: 1 HTN History: 1 Diabetes History: 0 Stroke History: 0 Vascular Disease History: 0 Age Score: 1 Gender Score: 1            Physical Exam:   VS:  BP 97/66 (BP Location: Left Arm, Patient Position: Sitting, Cuff Size: Normal)   Pulse 74   Ht 4' 10.5" (1.486 m)   Wt 160 lb 6.4 oz (72.8 kg)   LMP 07/01/1980   SpO2 96%   BMI 32.95 kg/m    Wt Readings from Last 3 Encounters:  01/24/23 160 lb 6.4 oz (72.8 kg)  01/20/23 154 lb (69.9 kg)  01/03/23 157 lb 14.4 oz (71.6 kg)    GEN: Well nourished, well developed in no acute distress, mildly obese NECK: No JVD; No carotid bruits CARDIAC: RRR, no murmurs, rubs, gallops RESPIRATORY:  Clear to auscultation without rales, wheezing or rhonchi  ABDOMEN: Soft, non-tender, non-distended EXTREMITIES:  No edema; No deformity   ASSESSMENT AND PLAN: .   CHF: Had a transient episode of heart failure exacerbation while at the beach, improved with diuretics, now appears euvolemic and NYHA functional class I while in normal sinus rhythm.  Blood pressure is borderline low and will reduce the diuretic dose back to 20 mg daily. AFib: Unfortunately, the Multaq has not really helped with the arrhythmia and she continues to have frequent episodes of RVR which are symptomatic.  Unable to increase the beta-blocker further due to her blood pressure.  It is encouraging that she does not have major structural cardiac abnormalities or any evidence of CAD (also note calcium score of 05 years ago).  We could use flecainide or dofetilide to help control the atrial fibrillation, but that would still not protect her from RVR during breakthrough events.  Briefly discussed this with Dr. Elberta Bennett, although he has not yet had the opportunity to meet  Amber Bennett and evaluate her fully.  We agree that the next best step is treatment with amiodarone, which will provide both rhythm control and better rate control.  Went over the fact that this medication has numerous side effects that generally happen with long-term use.  Hopefully, we will be able to go ahead with an ablation sooner rather than later and treatment amiodarone will be relatively short-term.  She had normal liver and thyroid function tests on labs performed last month.  Advised that she avoid excessive sunlight exposure (which she does anyway for her other medical conditions) and let her ophthalmologist know that she is taking this medication.  Advised her to call promptly with any unexpected respiratory complaints such as shortness of breath or cough not associated with arrhythmia.  Appointment scheduled with Dr. Elberta Bennett August 22. Anticoagulation: Xarelto without any bleeding complications.  She does have a history of remote GI bleeding when she took aspirin for joint complaints.  Trying to avoid NSAIDs, but unfortunately she cannot take acetaminophen either due to an allergic response.  Wants to avoid opiates.  In the long run,  may be well served by a Watchman procedure to avoid the need for anticoagulation.  We discussed this briefly today. HTN: Blood pressure is borderline low today.  Decrease the furosemide back to just 20 mg daily.  If necessary we can also decrease the dose of telmisartan. OSA:   Reached out today to our sleep providers to accelerate the CPAP titration trial. Obesity: She has had no benefit from treatment with semaglutide.  Trying to get on Mounjaro, but was denied by insurance.       Dispo: Stop Multaq.  Start amiodarone with loading dose of 400 mg daily for the next 3 weeks, until follow-up with EP.  Signed, Thurmon Fair, MD

## 2023-01-20 NOTE — Progress Notes (Unsigned)
I saw Amber Bennett shortly after she had completed her echocardiogram and nuclear perfusion study today.  She felt poorly last week.  She had frequent episodes of rapid rates detected by her phone which her associated with chest discomfort and shortness of breath.  The chest discomfort is described as a squeezing sensation in her left breast area and does not last more than 6 minutes.  It occurs randomly at rest during episodes of atrial fibrillation, although not consistently with all episodes of atrial fibrillation.  She feels tired.    She was very short of breath last week and returned from her beach trip early because she was feeling poorly.  After she was advised to start taking furosemide 20 mg daily there was improvement in her shortness of breath and reduction in her edema.  Yesterday she took 40 mg of furosemide and the improvement was more substantial.  Treatment with Multaq has not really helped so far.  However, it's not clear that it led to any deterioration.  She is currently normal sinus rhythm.  While she was undergoing her echocardiogram earlier this morning she was in atrial fibrillation with ventricular rates around 100 bpm at rest.  The echocardiogram shows normal left ventricular systolic function.  Atrial fibrillation limits evaluation of diastolic function but the mitral annulus diastolic velocities are pretty good.  She has mild mitral insufficiency and mild aortic insufficiency.  By the time she underwent the nuclear stress testing she was back in normal rhythm with sinus rates around 70 bpm.  My review of the nuclear images there is a small and mild mid-anterior perfusion defect that appears to be largely but not completely reversible.  It is much less prominent on the supine images compared with the upright images and does not resolve completely on the rest images.  I think it most likely represents breast attenuation artifact, but cannot exclude mild ischemia in the distribution  of a diagonal branch of the LAD artery.  The 4 times daily EF was 69%  I advised [name] to increase the metoprolol to tartrate to 75 mg twice daily, stop amlodipine, continue taking furosemide 40 mg daily and come back to see me in the office on Friday morning, 4 days from now.  Plan to keep her appointments to follow-up with EP to discuss atrial fibrillation ablation.  That appointment is not until a month from now and it is unlikely ablation will be scheduled before the end of the year.  In the meantime we need to find a rate control/rhythm control strategy that would lead to prevention of heart failure.  Primary, we will discuss whether she should switch from Multaq to flecainide or amiodarone.

## 2023-01-23 DIAGNOSIS — E876 Hypokalemia: Secondary | ICD-10-CM | POA: Diagnosis not present

## 2023-01-23 DIAGNOSIS — I509 Heart failure, unspecified: Secondary | ICD-10-CM | POA: Diagnosis not present

## 2023-01-23 DIAGNOSIS — D649 Anemia, unspecified: Secondary | ICD-10-CM | POA: Diagnosis not present

## 2023-01-23 DIAGNOSIS — N179 Acute kidney failure, unspecified: Secondary | ICD-10-CM | POA: Diagnosis not present

## 2023-01-23 DIAGNOSIS — R0600 Dyspnea, unspecified: Secondary | ICD-10-CM | POA: Diagnosis not present

## 2023-01-23 DIAGNOSIS — I440 Atrioventricular block, first degree: Secondary | ICD-10-CM | POA: Diagnosis not present

## 2023-01-23 DIAGNOSIS — I4892 Unspecified atrial flutter: Secondary | ICD-10-CM | POA: Diagnosis not present

## 2023-01-23 DIAGNOSIS — E785 Hyperlipidemia, unspecified: Secondary | ICD-10-CM | POA: Diagnosis not present

## 2023-01-23 DIAGNOSIS — E871 Hypo-osmolality and hyponatremia: Secondary | ICD-10-CM | POA: Diagnosis not present

## 2023-01-23 DIAGNOSIS — I10 Essential (primary) hypertension: Secondary | ICD-10-CM | POA: Diagnosis not present

## 2023-01-23 DIAGNOSIS — G4730 Sleep apnea, unspecified: Secondary | ICD-10-CM | POA: Diagnosis not present

## 2023-01-24 ENCOUNTER — Encounter: Payer: Self-pay | Admitting: Cardiovascular Disease

## 2023-01-24 ENCOUNTER — Ambulatory Visit: Payer: Medicare Other | Admitting: Cardiovascular Disease

## 2023-01-24 VITALS — BP 97/66 | HR 74 | Ht 58.5 in | Wt 160.4 lb

## 2023-01-24 DIAGNOSIS — E669 Obesity, unspecified: Secondary | ICD-10-CM | POA: Insufficient documentation

## 2023-01-24 DIAGNOSIS — I480 Paroxysmal atrial fibrillation: Secondary | ICD-10-CM | POA: Diagnosis not present

## 2023-01-24 DIAGNOSIS — D6869 Other thrombophilia: Secondary | ICD-10-CM | POA: Diagnosis not present

## 2023-01-24 DIAGNOSIS — I10 Essential (primary) hypertension: Secondary | ICD-10-CM | POA: Insufficient documentation

## 2023-01-24 DIAGNOSIS — G4733 Obstructive sleep apnea (adult) (pediatric): Secondary | ICD-10-CM | POA: Insufficient documentation

## 2023-01-24 DIAGNOSIS — I5031 Acute diastolic (congestive) heart failure: Secondary | ICD-10-CM | POA: Insufficient documentation

## 2023-01-24 MED ORDER — FUROSEMIDE 20 MG PO TABS: 20.0000 mg | ORAL_TABLET | Freq: Every day | ORAL | 3 refills | Status: DC

## 2023-01-24 MED ORDER — AMIODARONE HCL 200 MG PO TABS: ORAL_TABLET | ORAL | 1 refills | Status: DC

## 2023-01-24 NOTE — Patient Instructions (Signed)
Medication Instructions:  Stop Multaq START AMIODARONE: Take 400 mg a day (2 tablets) for 3 weeks, then decrease to 200 mg daily (1 tablet) DECREASE FUROSEMIDE to 20 mg every morning *If you need a refill on your cardiac medications before your next appointment, please call your pharmacy*  Follow-Up: At [facility], you and your health needs are our priority.  As part of our continuing mission to provide you with exceptional heart care, we have created designated Provider Care Teams.  These Care Teams include your primary Cardiologist (physician) and Advanced Practice Providers (APPs -  Physician Assistants and Nurse Practitioners) who all work together to provide you with the care you need, when you need it.  We recommend signing up for the patient portal called "MyChart".  Sign up information is provided on this After Visit Summary.  MyChart is used to connect with patients for Virtual Visits (Telemedicine).  Patients are able to view lab/test results, encounter notes, upcoming appointments, etc.  Non-urgent messages can be sent to your provider as well.   To learn more about what you can do with MyChart, go to [email or web address].    Your next appointment:    3-4 months  Provider:   Dr Royann Shivers

## 2023-01-27 ENCOUNTER — Telehealth: Payer: Self-pay

## 2023-01-27 ENCOUNTER — Ambulatory Visit: Payer: Medicare Other | Admitting: Orthopaedic Surgery

## 2023-01-27 DIAGNOSIS — G4733 Obstructive sleep apnea (adult) (pediatric): Secondary | ICD-10-CM

## 2023-01-27 NOTE — Telephone Encounter (Signed)
Notified patient of sleep study results and recommendations. All questions were answered and patient verbalized understanding. Order for CPAP machine and supplies sent to [facility] 01/27/23.

## 2023-01-28 ENCOUNTER — Ambulatory Visit (HOSPITAL_COMMUNITY)
Admission: RE | Admit: 2023-01-28 | Discharge: 2023-01-28 | Disposition: A | Discharge status: Home / Self Care | Payer: Medicare Other | Source: Ambulatory Visit | Attending: Internal Medicine | Admitting: Internal Medicine

## 2023-01-28 ENCOUNTER — Other Ambulatory Visit: Payer: Self-pay

## 2023-01-28 VITALS — BP 122/68 | HR 72 | Ht 58.5 in | Wt 160.0 lb

## 2023-01-28 DIAGNOSIS — Z7901 Long term (current) use of anticoagulants: Secondary | ICD-10-CM | POA: Insufficient documentation

## 2023-01-28 DIAGNOSIS — Z79899 Other long term (current) drug therapy: Secondary | ICD-10-CM | POA: Diagnosis not present

## 2023-01-28 DIAGNOSIS — I4892 Unspecified atrial flutter: Secondary | ICD-10-CM | POA: Insufficient documentation

## 2023-01-28 DIAGNOSIS — I480 Paroxysmal atrial fibrillation: Secondary | ICD-10-CM | POA: Diagnosis not present

## 2023-01-28 DIAGNOSIS — E785 Hyperlipidemia, unspecified: Secondary | ICD-10-CM | POA: Insufficient documentation

## 2023-01-28 DIAGNOSIS — D6869 Other thrombophilia: Secondary | ICD-10-CM | POA: Diagnosis not present

## 2023-01-28 DIAGNOSIS — I10 Essential (primary) hypertension: Secondary | ICD-10-CM | POA: Insufficient documentation

## 2023-01-28 DIAGNOSIS — E669 Obesity, unspecified: Secondary | ICD-10-CM | POA: Insufficient documentation

## 2023-01-28 DIAGNOSIS — G4733 Obstructive sleep apnea (adult) (pediatric): Secondary | ICD-10-CM | POA: Diagnosis not present

## 2023-01-28 NOTE — Progress Notes (Addendum)
Primary Care Physician: Rodrigo Ran, MD Primary Cardiologist: Dr. Royann Shivers Electrophysiologist: None     Referring Physician: Dr. Claudean Kinds is a 72 y.o. female with a history of HTN, HLD, aortic atherosclerosis, mild obesity, OSA on CPAP, atrial flutter, and paroxysmal atrial fibrillation who presents for consultation in the [facility]. Patient was started on Multaq for frequent episodes of Afib with RVR but did not help with burden. She was started on amiodarone by Dr. Royann Shivers on 7/26 with loading dose of 400 mg daily x 3 weeks. She is scheduled to see Dr. Elberta Fortis on 8/22. Patient is on Xarelto for a CHADS2VASC score of 4.  On evaluation today, she is currently in NSR. She is feeling well overall. She has noted intermittent stabbing chest pain for the past several months which she has brought up with cardiologist previously. Currently taking amiodarone 400 mg daily for loading dose. No missed doses of amiodarone or Xarelto. She has upcoming appt with EP to discuss ablation.     Today, she denies symptoms of palpitations, shortness of breath, orthopnea, PND, lower extremity edema, dizziness, presyncope, syncope, snoring, daytime somnolence, bleeding, or neurologic sequela. The patient is tolerating medications without difficulties and is otherwise without complaint today.    Atrial Fibrillation Risk Factors:  she does have symptoms or diagnosis of sleep apnea. she is compliant with CPAP therapy.   she has a BMI of Body mass index is 32.87 kg/m.Marland Kitchen Filed Weights   01/28/23 1427  Weight: 72.6 kg    Current Outpatient Medications  Medication Sig Dispense Refill   amiodarone (PACERONE) 200 MG tablet Take 400 mg a day (2 tablets) for 3 weeks, then decrease to 200 mg daily (1 tablet) 180 tablet 1   BIOTIN PO Take 1 tablet by mouth daily.     Calcium Carb-Cholecalciferol (CALTRATE 600+D3 PO) Take 1 tablet by mouth daily.      Cholecalciferol (VITAMIN D) 50 MCG (2000 UT) tablet Take 2,000 Units by mouth daily.     Coenzyme Q10 (CO Q 10 PO) Take 300 mg by mouth daily.     diphenoxylate-atropine (LOMOTIL) 2.5-0.025 MG tablet Take 1 tablet by mouth 4 (four) times daily as needed for diarrhea or loose stools. 90 tablet 0   DULoxetine (CYMBALTA) 30 MG capsule Take 30 mg by mouth every evening.  5   ezetimibe (ZETIA) 10 MG tablet Take 10 mg by mouth every evening.     furosemide (LASIX) 20 MG tablet Take 1 tablet (20 mg total) by mouth daily. 90 tablet 3   gabapentin (NEURONTIN) 800 MG tablet Take 1 tablet (800 mg total) by mouth 2 (two) times daily. 180 tablet 3   hyoscyamine (LEVSIN SL) 0.125 MG SL tablet Place 1 tablet (0.125 mg total) under the tongue every 6 (six) hours as needed. 30 tablet 6   ibandronate (BONIVA) 150 MG tablet Take 150 mg by mouth every 30 (thirty) days.     loratadine (CLARITIN) 10 MG tablet Take 10 mg by mouth daily.     metoprolol tartrate (LOPRESSOR) 50 MG tablet Take 1.5 tablets (75 mg total) by mouth 2 (two) times daily.     montelukast (SINGULAIR) 10 MG tablet Take 10 mg by mouth at bedtime.     Multiple Vitamins-Minerals (MULTIVITAMIN PO) Take 1 tablet by mouth daily.     Multiple Vitamins-Minerals (PRESERVISION AREDS PO) Take 1 capsule by mouth daily.     omega-3 acid ethyl esters (  LOVAZA) 1 G capsule Take 2 g by mouth 2 (two) times daily.     omeprazole (PRILOSEC) 20 MG capsule Take 20 mg by mouth 2 (two) times daily before a meal. Then reduce to once daily  3   OZEMPIC 1 MG/DOSE SOPN Inject 1 mg into the skin every Friday.  6   PHENobarbital (LUMINAL) 97.2 MG tablet TAKE 1 AND 1/2 TABLETS BY MOUTH EVERY DAY 135 tablet 3   potassium chloride (KLOR-CON) 20 MEQ packet Take 40 mEq by mouth daily.     PRALUENT 150 MG/ML SOAJ Inject 150 mg into the skin every 14 (fourteen) days.     Probiotic Product (PROBIOTIC PO) Take 1 capsule by mouth daily.     pyridoxine (B-6) 100 MG tablet Take 100 mg by  mouth daily.     rivaroxaban (XARELTO) 20 MG TABS tablet Take 1 tablet (20 mg total) by mouth daily with supper. 90 tablet 3   sodium chloride (OCEAN) 0.65 % SOLN nasal spray Place 1 spray into both nostrils as needed for congestion.     telmisartan (MICARDIS) 40 MG tablet Take 40 mg by mouth daily.      tiZANidine (ZANAFLEX) 4 MG tablet Take 1 tablet (4 mg total) by mouth 2 (two) times daily as needed for muscle spasms. 30 tablet 0   topiramate (TOPAMAX) 50 MG tablet TAKE 1 TABLET BY MOUTH EVERY MORNING AND 2 TABLETS EVERY EVENING 270 tablet 3   vitamin E 400 UNIT capsule Take 400 Units by mouth daily.     potassium chloride SA (KLOR-CON M) 20 MEQ tablet Take 60 mEq by mouth daily. (Patient not taking: Reported on 01/28/2023)     No current facility-administered medications for this encounter.    Atrial Fibrillation Management history:  Previous antiarrhythmic drugs: Multaq, amiodarone Previous cardioversions: None Previous ablations: None Anticoagulation history: Xarelto   ROS- All systems are reviewed and negative except as per the HPI above.  Physical Exam: BP 122/68   Pulse 72   Ht 4' 10.5" (1.486 m)   Wt 72.6 kg   LMP 07/01/1980   BMI 32.87 kg/m   GEN: Well nourished, well developed in no acute distress NECK: No JVD; No carotid bruits CARDIAC: Regular rate and rhythm, no murmurs, rubs, gallops RESPIRATORY:  Clear to auscultation without rales, wheezing or rhonchi  ABDOMEN: Soft, non-tender, non-distended EXTREMITIES:  No edema; No deformity   EKG today demonstrates  Vent. rate 72 BPM PR interval 248 ms QRS duration 78 ms QT/QTcB 388/424 ms P-R-T axes 68 84 74 Sinus rhythm with 1st degree A-V block Otherwise normal ECG When compared with ECG of 03-Jan-2023 12:17, PREVIOUS ECG IS PRESENT  Echo 01/20/23 demonstrated   1. Left ventricular ejection fraction, by estimation, is 60 to 65%. The  left ventricle has normal function. The left ventricle has no regional   wall motion abnormalities. There is mild asymmetric left ventricular  hypertrophy of the basal-septal segment.  Left ventricular diastolic parameters are indeterminate.   2. Right ventricular systolic function is normal. The right ventricular  size is normal.   3. Left atrial size was mildly dilated.   4. The mitral valve is grossly normal. Mild mitral valve regurgitation.  No evidence of mitral stenosis.   5. The aortic valve is tricuspid. Aortic valve regurgitation is mild.   6. The inferior vena cava is normal in size with greater than 50%  respiratory variability, suggesting right atrial pressure of 3 mmHg.   Cardiac monitor 11/2022: 48%  Afib/atrial flutter burden.  ASSESSMENT & PLAN CHA2DS2-VASc Score = 4  The patient's score is based upon: CHF History: 1 HTN History: 1 Diabetes History: 0 Stroke History: 0 Vascular Disease History: 0 Age Score: 1 Gender Score: 1       ASSESSMENT AND PLAN: Paroxysmal Atrial Fibrillation (ICD10:  I48.0) The patient's CHA2DS2-VASc score is 4, indicating a 4.8% annual risk of stroke.    She is in NSR today.  Qtc stable.  Continue amiodarone 400 mg for loading dose 3 weeks. She has an appt to discuss ablation with Dr. Elberta Fortis.   Secondary Hypercoagulable State (ICD10:  D68.69) The patient is at significant risk for stroke/thromboembolism based upon her CHA2DS2-VASc Score of 4.  Continue Rivaroxaban (Xarelto).  No missed doses.    Follow up as scheduled with Dr. Elberta Fortis.    Lake Bells, PA-C  [facility] [address] [place], [place] [address] [phone]

## 2023-01-30 ENCOUNTER — Ambulatory Visit (INDEPENDENT_AMBULATORY_CARE_PROVIDER_SITE_OTHER): Payer: Medicare Other | Admitting: Orthopaedic Surgery

## 2023-01-30 ENCOUNTER — Encounter: Payer: Self-pay | Admitting: Orthopaedic Surgery

## 2023-01-30 ENCOUNTER — Other Ambulatory Visit (INDEPENDENT_AMBULATORY_CARE_PROVIDER_SITE_OTHER): Payer: Medicare Other

## 2023-01-30 DIAGNOSIS — Z96641 Presence of right artificial hip joint: Secondary | ICD-10-CM

## 2023-01-30 MED ORDER — TIZANIDINE HCL 4 MG PO TABS: 4.0000 mg | ORAL_TABLET | Freq: Two times a day (BID) | ORAL | 0 refills | Status: DC | PRN

## 2023-01-30 NOTE — Progress Notes (Signed)
Amber Bennett is now almost a year out from a right total hip arthroplasty to treat significant right hip arthritis.  She said the hip is doing great.  Unfortunately now she has A-fib.  She is on Xarelto.  I believe she is having an ablation potentially later in August.  She says the hip has had no issues at all.  She occasionally does take Zanaflex at night and does need refill of this.  On exam her right hip moves smoothly and fluidly.  She is walking without a limp or an assistive device.  Her leg lengths are also equal.  An AP pelvis and lateral of the right hip shows a well-seated bone ingrown total hip arthroplasty with no complicating features.  At this point follow-up for her hip can be as needed.  I will refill her Zanaflex.  I wished her luck about her ablation and I will touch base with her son who is actually my primary care physician and a good friend.  All questions and concerns were addressed and answered.

## 2023-01-31 ENCOUNTER — Telehealth: Payer: Self-pay | Admitting: Cardiovascular Disease

## 2023-01-31 ENCOUNTER — Encounter: Payer: Self-pay | Admitting: Cardiovascular Disease

## 2023-01-31 NOTE — Telephone Encounter (Signed)
Pt c/o BP issue: STAT if pt c/o blurred vision, one-sided weakness or slurred speech  1. What are your last 5 BP readings?   This Morning today 72/51 75/52 75/51   2. Are you having any other symptoms (ex. Dizziness, headache, blurred vision, passed out)? Lightheaded and Dizzy  3. What is your BP issue? Patient stated that we recently changed her medication to lower her BP. Patient is concerned because her BP is reading low and she feels very lightheaded and dizzy. Please advise.

## 2023-01-31 NOTE — Telephone Encounter (Signed)
I called and spoke to Mrs. Amber Bennett.   Asked the patient to stop taking telmisartan until her blood pressure is back in normal range. Also advised reducing the metoprolol to 50 mg twice daily (do not take the extra half pill twice daily anymore). Call us back on Monday with her blood pressure and heart rate.

## 2023-01-31 NOTE — Telephone Encounter (Signed)
Returned pt call. Pt's BP is running in the 70's/50's since the increase in her Metoprolol. She is dizzy, lightheaded and very tired. Pt states she has a dentist appt today and this nurse advised her not to drive until this resolves and we get a recommendation from Dr. Royann Shivers. Pt and husband verbalized understanding. Please advise.

## 2023-02-03 ENCOUNTER — Telehealth: Payer: Self-pay | Admitting: Cardiovascular Disease

## 2023-02-03 ENCOUNTER — Other Ambulatory Visit: Payer: Medicare Other

## 2023-02-03 NOTE — Telephone Encounter (Signed)
Patient stated she went into AFIB yesterday.  She is asymptomatic. Her BP this morning was 118/87 hr 120. When she checked her BP again her BP was 128/65 hr 92. This afternoon heart rate 78. Assured her to continue to take medications as prescribed and staying hydrated. She will continue to monitor heart rate and BP.   She also wanted to let  you know she is wearing her CPAP at night and she loves it.   She also wanted to know if you can help with getting mounjuro for weight loss and her diabetes. She stated she was on ozempic and its helped controlled her diabetes but nothing for her weight loss. She feel if it will help with her sleep apnea and her heart problems.

## 2023-02-03 NOTE — Telephone Encounter (Signed)
Pt c/o BP issue: STAT if pt c/o blurred vision, one-sided weakness or slurred speech  1. What are your last 5 BP readings? 118/87 and heart rate was 120, 115, 128/65 heart rate 92  2. Are you having any other symptoms (ex. Dizziness, headache, blurred vision, passed out)? No Afib when she woke up and heart rate was 120- took it again at 2:.00 heart rate was 78.  3. What is your BP issue? - she wants Dr C to know that she is wearing her C Pap machine. She says she loves it      She said she was told to call Dr C and give him an update and talk to him

## 2023-02-04 NOTE — Telephone Encounter (Signed)
Pt is calling to f/u on her receiving a callback and would like to give nurse BP readings for today. Please advise

## 2023-02-04 NOTE — Telephone Encounter (Signed)
Returned pt's call. She wanted to report her BP's today. BP today 78/44 HR 60, BP is in Afib and HR is 80. 96/55 and now 82/55. No chest pain, dizzy, lightheaded and very tired. Pt has a slight headache as well. Spoke with Dr. Rennis Golden and per his recommendation she is to go to the ER. Pt. Verbalized understanding.

## 2023-02-05 NOTE — Telephone Encounter (Signed)
Left message for pt to call, per my chart message 01/31/23-Croitoru, Rachelle Hora, MD  to Amber Bennett      01/31/23   Hello, Amber Bennett Just documenting the changes I recommended during our phone call -Please stop taking telmisartan -Please reduce the metoprolol to 50 mg twice daily (stop taking the extra 1/2 pill) -Please call us back on Monday with your blood pressure and heart rate. Do not hesitate to call back during the weekend with any new complaints.

## 2023-02-07 ENCOUNTER — Ambulatory Visit (HOSPITAL_BASED_OUTPATIENT_CLINIC_OR_DEPARTMENT_OTHER): Payer: Medicare Other | Admitting: Family

## 2023-02-07 MED ORDER — METOPROLOL TARTRATE 50 MG PO TABS: 50.0000 mg | ORAL_TABLET | Freq: Two times a day (BID) | ORAL | Status: AC

## 2023-02-11 DIAGNOSIS — M797 Fibromyalgia: Secondary | ICD-10-CM | POA: Diagnosis not present

## 2023-02-11 DIAGNOSIS — M1991 Primary osteoarthritis, unspecified site: Secondary | ICD-10-CM | POA: Diagnosis not present

## 2023-02-11 DIAGNOSIS — Z6834 Body mass index (BMI) 34.0-34.9, adult: Secondary | ICD-10-CM | POA: Diagnosis not present

## 2023-02-11 DIAGNOSIS — R768 Other specified abnormal immunological findings in serum: Secondary | ICD-10-CM | POA: Diagnosis not present

## 2023-02-11 DIAGNOSIS — E669 Obesity, unspecified: Secondary | ICD-10-CM | POA: Diagnosis not present

## 2023-02-11 DIAGNOSIS — M3390 Dermatopolymyositis, unspecified, organ involvement unspecified: Secondary | ICD-10-CM | POA: Diagnosis not present

## 2023-02-11 DIAGNOSIS — M79641 Pain in right hand: Secondary | ICD-10-CM | POA: Diagnosis not present

## 2023-02-11 DIAGNOSIS — M25511 Pain in right shoulder: Secondary | ICD-10-CM | POA: Diagnosis not present

## 2023-02-11 DIAGNOSIS — M2559 Pain in other specified joint: Secondary | ICD-10-CM | POA: Diagnosis not present

## 2023-02-20 ENCOUNTER — Ambulatory Visit: Payer: Medicare Other | Admitting: Cardiology

## 2023-02-20 ENCOUNTER — Encounter: Payer: Self-pay | Admitting: Cardiology

## 2023-02-20 VITALS — BP 120/82 | HR 98 | Ht 58.5 in | Wt 164.0 lb

## 2023-02-20 DIAGNOSIS — G4733 Obstructive sleep apnea (adult) (pediatric): Secondary | ICD-10-CM

## 2023-02-20 DIAGNOSIS — I5032 Chronic diastolic (congestive) heart failure: Secondary | ICD-10-CM | POA: Diagnosis not present

## 2023-02-20 DIAGNOSIS — D6869 Other thrombophilia: Secondary | ICD-10-CM | POA: Diagnosis not present

## 2023-02-20 DIAGNOSIS — I4819 Other persistent atrial fibrillation: Secondary | ICD-10-CM | POA: Diagnosis not present

## 2023-02-20 DIAGNOSIS — I10 Essential (primary) hypertension: Secondary | ICD-10-CM

## 2023-02-20 NOTE — H&P (View-Only) (Signed)
Electrophysiology Office Note:   Date:  02/20/2023  ID:  SHADE HARDING, DOB 12/27/1950, MRN [id number]  Primary Cardiologist: None Electrophysiologist: None      History of Present Illness:   Amber Bennett is a 72 y.o. female with h/o atrial fibrillation seen today for  for Electrophysiology evaluation of atrial fibrillation at the request of Mihai Croitrou.   She feels quite poorly when she is in atrial fibrillation.  She does not have fatigue or shortness of breath when she is in normal rhythm, though when she is in atrial fibrillation, she is unable to do her daily activities.  She has not been driving that she has felt so poorly, having to get rides from friends and her husband.  She would ultimately prefer a rhythm control strategy.  She is on amiodarone, but is continued to have episodes of atrial fibrillation.    She has a history of atrial fibrillation, diastolic heart failure, hypertension, hyperlipidemia, obesity, sleep apnea.  She was initially on Multaq for her atrial fibrillation but continued to have episodes.  She has been switched to amiodarone.  She does continue to have episodes of atrial fibrillation, though her heart rates are better controlled.  She also has chest tightness during episodes of rapid arrhythmia.  She had an echo that showed a normal ejection fraction and a Myoview without ischemia.     Review of systems complete and found to be negative unless listed in HPI.   EP Information / Studies Reviewed:    EKG is not ordered today. EKG from 01/28/23 reviewed which showed sinus rhythm        Risk Assessment/Calculations:    CHA2DS2-VASc Score = 4   This indicates a 4.8% annual risk of stroke. The patient's score is based upon: CHF History: 1 HTN History: 1 Diabetes History: 0 Stroke History: 0 Vascular Disease History: 0 Age Score: 1 Gender Score: 1             Physical Exam:   VS:  BP 120/82 (BP Location: Left Arm, Patient Position: Sitting,  Cuff Size: Normal)   Pulse 98   Ht 4' 10.5" (1.486 m)   Wt 164 lb (74.4 kg)   LMP 07/01/1980   SpO2 98%   BMI 33.69 kg/m    Wt Readings from Last 3 Encounters:  02/20/23 164 lb (74.4 kg)  01/28/23 160 lb (72.6 kg)  01/24/23 160 lb 6.4 oz (72.8 kg)     GEN: Well nourished, well developed in no acute distress NECK: No JVD; No carotid bruits CARDIAC: Irregularly irregular rate and rhythm, no murmurs, rubs, gallops RESPIRATORY:  Clear to auscultation without rales, wheezing or rhonchi  ABDOMEN: Soft, non-tender, non-distended EXTREMITIES:  No edema; No deformity   ASSESSMENT AND PLAN:    1.  Persistent atrial fibrillation: Currently on amiodarone and Xarelto.  She is unfortunately continued to have episodes of atrial fibrillation despite her current medications.  She would benefit from a rhythm control strategy as she feels quite poorly in atrial fibrillation.  As she has continued to have atrial fibrillation on amiodarone, we Ashantia Amaral plan for ablation.  Risk and benefits have been discussed.  She understands these risks and is agreed to the procedure.  Risk, benefits, and alternatives to EP study and radiofrequency ablation for afib were also discussed in detail today. These risks include but are not limited to stroke, bleeding, vascular damage, tamponade, perforation, damage to the esophagus, lungs, and other structures, pulmonary vein stenosis, worsening renal function,  and death. The patient understands these risk and wishes to proceed.  We Nikky Duba therefore proceed with catheter ablation at the next available time.  Carto, ICE, anesthesia are requested for the procedure.  Valda Christenson also obtain CT PV protocol prior to the procedure to exclude LAA thrombus and further evaluate atrial anatomy.  2.  Diastolic heart failure: No obvious volume overload.  Plan per primary cardiology.  3.  Secondary hypercoagulable state: Currently on Xarelto for atrial fibrillation  4.  Hypertension: Currently  well-controlled  5.  Sleep apnea: CPAP compliance encouraged  Follow up with Dr. Elberta Fortis as usual post procedure  Signed, Cortney Mckinney Jorja Loa, MD

## 2023-02-20 NOTE — Patient Instructions (Addendum)
Medication Instructions:  Your physician recommends that you continue on your current medications as directed. Please refer to the Current Medication list given to you today.  *If you need a refill on your cardiac medications before your next appointment, please call your pharmacy*   Lab Work: Pre procedure labs -- see procedure instruction letter:  BMP & CBC  If you have labs (blood work) drawn today and your tests are completely normal, you will receive your results only [id number] Message (if you have MyChart). Otherwise no news is good news. If you have any lab test that is abnormal and we need to change your treatment, we will call you to review the results.   Testing/Procedures: Your physician has requested that you have cardiac CT within 7 days PRIOR to your ablation. Cardiac computed tomography (CT) is a painless test that uses an x-ray machine to take clear, detailed pictures of your heart.  Please follow instruction below located under "other instructions". You will get a call from our office to schedule the date for this test.  Your physician has recommended that you have an ablation. Catheter ablation is a medical procedure used to treat some cardiac arrhythmias (irregular heartbeats). During catheter ablation, a long, thin, flexible tube is put into a blood vessel in your groin (upper thigh), or neck. This tube is called an ablation catheter. It is then guided to your heart through the blood vessel. Radio frequency waves destroy small areas of heart tissue where abnormal heartbeats may cause an arrhythmia to start.   The EP scheduler, April, will be in touch to schedule this and go over instructions.   Follow-Up: At [facility], you and your health needs are our priority.  As part of our continuing mission to provide you with exceptional heart care, we have created designated Provider Care Teams.  These Care Teams include your primary Cardiologist (physician) and Advanced  Practice Providers (APPs -  Physician Assistants and Nurse Practitioners) who all work together to provide you with the care you need, when you need it.  Your next appointment:   1 month(s) after your ablation  The format for your next appointment:   In Person  Provider:   AFib clinic   Thank you for choosing [facility]!!   Dory Horn, RN [phone]    Other Instructions   Cardiac Ablation Cardiac ablation is a procedure to destroy (ablate) some heart tissue that is sending bad signals. These bad signals cause problems in heart rhythm. The heart has many areas that make these signals. If there are problems in these areas, they can make the heart beat in a way that is not normal. Destroying some tissues can help make the heart rhythm normal. Tell your doctor about: Any allergies you have. All medicines you are taking. These include vitamins, herbs, eye drops, creams, and over-the-counter medicines. Any problems you or family members have had with medicines that make you fall asleep (anesthetics). Any blood disorders you have. Any surgeries you have had. Any medical conditions you have, such as kidney failure. Whether you are pregnant or may be pregnant. What are the risks? This is a safe procedure. But problems may occur, including: Infection. Bruising and bleeding. Bleeding into the chest. Stroke or blood clots. Damage to nearby areas of your body. Allergies to medicines or dyes. The need for a pacemaker if the normal system is damaged. Failure of the procedure to treat the problem. What happens before the procedure? Medicines Ask your doctor about:  Changing or stopping your normal medicines. This is important. Taking aspirin and ibuprofen. Do not take these medicines unless your doctor tells you to take them. Taking other medicines, vitamins, herbs, and supplements. General instructions Follow instructions from your doctor about what you cannot eat or  drink. Plan to have someone take you home from the hospital or clinic. If you will be going home right after the procedure, plan to have someone with you for 24 hours. Ask your doctor what steps will be taken to prevent infection. What happens during the procedure?  An IV tube will be put into one of your veins. You will be given a medicine to help you relax. The skin on your neck or groin will be numbed. A cut (incision) will be made in your neck or groin. A needle will be put through your cut and into a large vein. A tube (catheter) will be put into the needle. The tube will be moved to your heart. Dye may be put through the tube. This helps your doctor see your heart. Small devices (electrodes) on the tube will send out signals. A type of energy will be used to destroy some heart tissue. The tube will be taken out. Pressure will be held on your cut. This helps stop bleeding. A bandage will be put over your cut. The exact procedure may vary among doctors and hospitals. What happens after the procedure? You will be watched until you leave the hospital or clinic. This includes checking your heart rate, breathing rate, oxygen, and blood pressure. Your cut will be watched for bleeding. You will need to lie still for a few hours. Do not drive for 24 hours or as long as your doctor tells you. Summary Cardiac ablation is a procedure to destroy some heart tissue. This is done to treat heart rhythm problems. Tell your doctor about any medical conditions you may have. Tell him or her about all medicines you are taking to treat them. This is a safe procedure. But problems may occur. These include infection, bruising, bleeding, and damage to nearby areas of your body. Follow what your doctor tells you about food and drink. You may also be told to change or stop some of your medicines. After the procedure, do not drive for 24 hours or as long as your doctor tells you. This information is not  intended to replace advice given to you by your health care provider. Make sure you discuss any questions you have with your health care provider. Document Revised: 09/07/2021 Document Reviewed: 05/20/2019 Elsevier Patient Education  2023 Elsevier Inc.   Cardiac Ablation, Care After  This sheet gives you information about how to care for yourself after your procedure. Your health care provider may also give you more specific instructions. If you have problems or questions, contact your health care provider. What can I expect after the procedure? After the procedure, it is common to have: Bruising around your puncture site. Tenderness around your puncture site. Skipped heartbeats. If you had an atrial fibrillation ablation, you may have atrial fibrillation during the first several months after your procedure.  Tiredness (fatigue).  Follow these instructions at home: Puncture site care  Follow instructions from your health care provider about how to take care of your puncture site. Make sure you: If present, leave stitches (sutures), skin glue, or adhesive strips in place. These skin closures may need to stay in place for up to 2 weeks. If adhesive strip edges start to loosen and  curl up, you may trim the loose edges. Do not remove adhesive strips completely unless your health care provider tells you to do that. If a large square bandage is present, this may be removed 24 hours after surgery.  Check your puncture site every day for signs of infection. Check for: Redness, swelling, or pain. Fluid or blood. If your puncture site starts to bleed, lie down on your back, apply firm pressure to the area, and contact your health care provider. Warmth. Pus or a bad smell. A pea or small marble sized lump at the site is normal and can take up to three months to resolve.  Driving Do not drive for at least 4 days after your procedure or however long your health care provider recommends. (Do not resume  driving if you have previously been instructed not to drive for other health reasons.) Do not drive or use heavy machinery while taking prescription pain medicine. Activity Avoid activities that take a lot of effort for at least 7 days after your procedure. Do not lift anything that is heavier than 5 lb (4.5 kg) for one week.  No sexual activity for 1 week.  Return to your normal activities as told by your health care provider. Ask your health care provider what activities are safe for you. General instructions Take over-the-counter and prescription medicines only as told by your health care provider. Do not use any products that contain nicotine or tobacco, such as cigarettes and e-cigarettes. If you need help quitting, ask your health care provider. You may shower after 24 hours, but Do not take baths, swim, or use a hot tub for 1 week.  Do not drink alcohol for 24 hours after your procedure. Keep all follow-up visits as told by your health care provider. This is important. Contact a health care provider if: You have redness, mild swelling, or pain around your puncture site. You have fluid or blood coming from your puncture site that stops after applying firm pressure to the area. Your puncture site feels warm to the touch. You have pus or a bad smell coming from your puncture site. You have a fever. You have chest pain or discomfort that spreads to your neck, jaw, or arm. You have chest pain that is worse with lying on your back or taking a deep breath. You are sweating a lot. You feel nauseous. You have a fast or irregular heartbeat. You have shortness of breath. You are dizzy or light-headed and feel the need to lie down. You have pain or numbness in the arm or leg closest to your puncture site. Get help right away if: Your puncture site suddenly swells. Your puncture site is bleeding and the bleeding does not stop after applying firm pressure to the area. These symptoms may  represent a serious problem that is an emergency. Do not wait to see if the symptoms will go away. Get medical help right away. Call your local emergency services (911 in the [place] Do not drive yourself to the hospital. Summary After the procedure, it is normal to have bruising and tenderness at the puncture site in your groin, neck, or forearm. Check your puncture site every day for signs of infection. Get help right away if your puncture site is bleeding and the bleeding does not stop after applying firm pressure to the area. This is a medical emergency. This information is not intended to replace advice given to you by your health care provider. Make sure you discuss any  questions you have with your health care provider.

## 2023-02-20 NOTE — Progress Notes (Signed)
Electrophysiology Office Note:   Date:  02/20/2023  ID:  Amber Bennett, DOB 12/27/1950, MRN [id number]  Primary Cardiologist: None Electrophysiologist: None      History of Present Illness:   Amber Bennett is a 72 y.o. female with h/o atrial fibrillation seen today for  for Electrophysiology evaluation of atrial fibrillation at the request of Mihai Croitrou.   She feels quite poorly when she is in atrial fibrillation.  She does not have fatigue or shortness of breath when she is in normal rhythm, though when she is in atrial fibrillation, she is unable to do her daily activities.  She has not been driving that she has felt so poorly, having to get rides from friends and her husband.  She would ultimately prefer a rhythm control strategy.  She is on amiodarone, but is continued to have episodes of atrial fibrillation.    She has a history of atrial fibrillation, diastolic heart failure, hypertension, hyperlipidemia, obesity, sleep apnea.  She was initially on Multaq for her atrial fibrillation but continued to have episodes.  She has been switched to amiodarone.  She does continue to have episodes of atrial fibrillation, though her heart rates are better controlled.  She also has chest tightness during episodes of rapid arrhythmia.  She had an echo that showed a normal ejection fraction and a Myoview without ischemia.     Review of systems complete and found to be negative unless listed in HPI.   EP Information / Studies Reviewed:    EKG is not ordered today. EKG from 01/28/23 reviewed which showed sinus rhythm        Risk Assessment/Calculations:    CHA2DS2-VASc Score = 4   This indicates a 4.8% annual risk of stroke. The patient's score is based upon: CHF History: 1 HTN History: 1 Diabetes History: 0 Stroke History: 0 Vascular Disease History: 0 Age Score: 1 Gender Score: 1             Physical Exam:   VS:  BP 120/82 (BP Location: Left Arm, Patient Position: Sitting,  Cuff Size: Normal)   Pulse 98   Ht 4' 10.5" (1.486 m)   Wt 164 lb (74.4 kg)   LMP 07/01/1980   SpO2 98%   BMI 33.69 kg/m    Wt Readings from Last 3 Encounters:  02/20/23 164 lb (74.4 kg)  01/28/23 160 lb (72.6 kg)  01/24/23 160 lb 6.4 oz (72.8 kg)     GEN: Well nourished, well developed in no acute distress NECK: No JVD; No carotid bruits CARDIAC: Irregularly irregular rate and rhythm, no murmurs, rubs, gallops RESPIRATORY:  Clear to auscultation without rales, wheezing or rhonchi  ABDOMEN: Soft, non-tender, non-distended EXTREMITIES:  No edema; No deformity   ASSESSMENT AND PLAN:    1.  Persistent atrial fibrillation: Currently on amiodarone and Xarelto.  She is unfortunately continued to have episodes of atrial fibrillation despite her current medications.  She would benefit from a rhythm control strategy as she feels quite poorly in atrial fibrillation.  As she has continued to have atrial fibrillation on amiodarone, we Ashantia Amaral plan for ablation.  Risk and benefits have been discussed.  She understands these risks and is agreed to the procedure.  Risk, benefits, and alternatives to EP study and radiofrequency ablation for afib were also discussed in detail today. These risks include but are not limited to stroke, bleeding, vascular damage, tamponade, perforation, damage to the esophagus, lungs, and other structures, pulmonary vein stenosis, worsening renal function,  and death. The patient understands these risk and wishes to proceed.  We Nikky Duba therefore proceed with catheter ablation at the next available time.  Carto, ICE, anesthesia are requested for the procedure.  Valda Christenson also obtain CT PV protocol prior to the procedure to exclude LAA thrombus and further evaluate atrial anatomy.  2.  Diastolic heart failure: No obvious volume overload.  Plan per primary cardiology.  3.  Secondary hypercoagulable state: Currently on Xarelto for atrial fibrillation  4.  Hypertension: Currently  well-controlled  5.  Sleep apnea: CPAP compliance encouraged  Follow up with Dr. Elberta Fortis as usual post procedure  Signed, Cortney Mckinney Jorja Loa, MD

## 2023-02-26 ENCOUNTER — Telehealth: Payer: Self-pay

## 2023-02-26 DIAGNOSIS — I4819 Other persistent atrial fibrillation: Secondary | ICD-10-CM

## 2023-02-26 NOTE — Addendum Note (Signed)
Addended by: Cleda Mccreedy on: 02/26/2023 11:03 AM   Modules accepted: Orders

## 2023-02-26 NOTE — Telephone Encounter (Signed)
Pt is scheduled for Afib Ablation with Dr. Elberta Fortis on 9/12 at 7:30 AM.  I have put in CT order and she is aware that someone will call her to schedule.  She will come to [facility] on 8/29 for labwork.  I will leave instruction letters at front desk for her to pick up when she comes in for labs.

## 2023-02-26 NOTE — Telephone Encounter (Signed)
LM for pt to call to schedule Ablation with Dr. Elberta Fortis

## 2023-02-27 ENCOUNTER — Ambulatory Visit: Payer: Medicare Other | Attending: Cardiology

## 2023-02-27 DIAGNOSIS — I4819 Other persistent atrial fibrillation: Secondary | ICD-10-CM

## 2023-02-28 LAB — CBC
Hematocrit: 38.3 % (ref 34.0–46.6)
Hemoglobin: 12.8 g/dL (ref 11.1–15.9)
MCH: 32.3 pg (ref 26.6–33.0)
MCHC: 33.4 g/dL (ref 31.5–35.7)
MCV: 97 fL (ref 79–97)
Platelets: 156 10*3/uL (ref 150–450)
RBC: 3.96 x10E6/uL (ref 3.77–5.28)
RDW: 12.6 % (ref 11.7–15.4)
WBC: 7 10*3/uL (ref 3.4–10.8)

## 2023-02-28 LAB — BASIC METABOLIC PANEL
BUN/Creatinine Ratio: 24 (ref 12–28)
BUN: 18 mg/dL (ref 8–27)
CO2: 20 mmol/L (ref 20–29)
Calcium: 8.5 mg/dL — ABNORMAL LOW (ref 8.7–10.3)
Chloride: 103 mmol/L (ref 96–106)
Creatinine, Ser: 0.76 mg/dL (ref 0.57–1.00)
Glucose: 100 mg/dL — ABNORMAL HIGH (ref 70–99)
Potassium: 4.1 mmol/L (ref 3.5–5.2)
Sodium: 138 mmol/L (ref 134–144)
eGFR: 82 mL/min/{1.73_m2} (ref >59)

## 2023-03-02 NOTE — Pre-Procedure Instructions (Signed)
Attempted to call patient regarding procedure scheduled for 9/12 with anesthesia.  Anesthesia requires certain medications to be held 7 days before procedure.  You take Amber Bennett-  Don't take after Wednesday 9/5 until after procedure.

## 2023-03-05 ENCOUNTER — Ambulatory Visit (HOSPITAL_COMMUNITY)
Admission: RE | Admit: 2023-03-05 | Discharge: 2023-03-05 | Disposition: A | Discharge status: Home / Self Care | Payer: Medicare Other | Source: Ambulatory Visit | Attending: Cardiology | Admitting: Cardiology

## 2023-03-05 DIAGNOSIS — I4819 Other persistent atrial fibrillation: Secondary | ICD-10-CM

## 2023-03-05 DIAGNOSIS — I517 Cardiomegaly: Secondary | ICD-10-CM | POA: Diagnosis not present

## 2023-03-05 MED ORDER — IOHEXOL 350 MG/ML SOLN
95.0000 mL | Freq: Once | INTRAVENOUS | Status: AC | PRN
  Administered 2023-03-05: 95 mL via INTRAVENOUS

## 2023-03-06 ENCOUNTER — Telehealth: Payer: Self-pay | Admitting: Cardiology

## 2023-03-06 NOTE — Telephone Encounter (Signed)
Patient wants a call back from RN Sherri regarding upcoming procedure.

## 2023-03-06 NOTE — Telephone Encounter (Signed)
Spoke with patient concerning upcoming ablation, answered all questions, and reviewed letter. No further needs at this time

## 2023-03-12 NOTE — Pre-Procedure Instructions (Signed)
Attempted to call patient regarding procedure instructions .  Left voicemail on the following items: Arrival time 5:15 Nothing to eat or drink after midnight No meds AM of procedure Responsible person to drive you home and stay with you for 24 hrs  Have you missed any doses of anti-coagulant Xarelto- should be taken once a day, if you have missed any doses please let us know.  Don't take dose in there morning.

## 2023-03-13 ENCOUNTER — Other Ambulatory Visit: Payer: Self-pay

## 2023-03-13 ENCOUNTER — Ambulatory Visit (HOSPITAL_COMMUNITY)
Admission: RE | Admit: 2023-03-13 | Discharge: 2023-03-13 | Disposition: A | Discharge status: Home / Self Care | Payer: Medicare Other | Source: Ambulatory Visit | Attending: Cardiology | Admitting: Cardiology

## 2023-03-13 ENCOUNTER — Encounter (HOSPITAL_COMMUNITY)
Admission: RE | Disposition: A | Discharge status: Home / Self Care | Payer: Self-pay | Source: Ambulatory Visit | Attending: Cardiology

## 2023-03-13 ENCOUNTER — Ambulatory Visit (HOSPITAL_BASED_OUTPATIENT_CLINIC_OR_DEPARTMENT_OTHER): Payer: Medicare Other | Admitting: Certified Registered Nurse Anesthetist

## 2023-03-13 ENCOUNTER — Ambulatory Visit (HOSPITAL_COMMUNITY): Payer: Medicare Other | Admitting: Certified Registered Nurse Anesthetist

## 2023-03-13 DIAGNOSIS — I480 Paroxysmal atrial fibrillation: Secondary | ICD-10-CM | POA: Diagnosis not present

## 2023-03-13 DIAGNOSIS — I10 Essential (primary) hypertension: Secondary | ICD-10-CM | POA: Diagnosis not present

## 2023-03-13 DIAGNOSIS — E669 Obesity, unspecified: Secondary | ICD-10-CM | POA: Diagnosis not present

## 2023-03-13 DIAGNOSIS — I110 Hypertensive heart disease with heart failure: Secondary | ICD-10-CM | POA: Diagnosis not present

## 2023-03-13 DIAGNOSIS — Z7901 Long term (current) use of anticoagulants: Secondary | ICD-10-CM | POA: Diagnosis not present

## 2023-03-13 DIAGNOSIS — I4819 Other persistent atrial fibrillation: Secondary | ICD-10-CM | POA: Diagnosis not present

## 2023-03-13 DIAGNOSIS — Z87891 Personal history of nicotine dependence: Secondary | ICD-10-CM | POA: Diagnosis not present

## 2023-03-13 DIAGNOSIS — F32A Depression, unspecified: Secondary | ICD-10-CM | POA: Diagnosis not present

## 2023-03-13 DIAGNOSIS — E785 Hyperlipidemia, unspecified: Secondary | ICD-10-CM | POA: Diagnosis not present

## 2023-03-13 DIAGNOSIS — G4730 Sleep apnea, unspecified: Secondary | ICD-10-CM | POA: Diagnosis not present

## 2023-03-13 DIAGNOSIS — I5032 Chronic diastolic (congestive) heart failure: Secondary | ICD-10-CM | POA: Insufficient documentation

## 2023-03-13 DIAGNOSIS — Z8669 Personal history of other diseases of the nervous system and sense organs: Secondary | ICD-10-CM | POA: Insufficient documentation

## 2023-03-13 DIAGNOSIS — D6869 Other thrombophilia: Secondary | ICD-10-CM | POA: Diagnosis not present

## 2023-03-13 DIAGNOSIS — Z6833 Body mass index (BMI) 33.0-33.9, adult: Secondary | ICD-10-CM | POA: Insufficient documentation

## 2023-03-13 DIAGNOSIS — E119 Type 2 diabetes mellitus without complications: Secondary | ICD-10-CM | POA: Diagnosis not present

## 2023-03-13 DIAGNOSIS — I483 Typical atrial flutter: Secondary | ICD-10-CM | POA: Insufficient documentation

## 2023-03-13 DIAGNOSIS — I4891 Unspecified atrial fibrillation: Secondary | ICD-10-CM | POA: Diagnosis not present

## 2023-03-13 HISTORY — PX: ATRIAL FIBRILLATION ABLATION: EP1191

## 2023-03-13 LAB — POCT ACTIVATED CLOTTING TIME: Activated Clotting Time: 489 s

## 2023-03-13 LAB — GLUCOSE, CAPILLARY
Glucose-Capillary: 111 mg/dL — ABNORMAL HIGH (ref 70–99)
Glucose-Capillary: 153 mg/dL — ABNORMAL HIGH (ref 70–99)

## 2023-03-13 SURGERY — ATRIAL FIBRILLATION ABLATION
Anesthesia: General

## 2023-03-13 MED ORDER — ROCURONIUM BROMIDE 10 MG/ML (PF) SYRINGE
PREFILLED_SYRINGE | INTRAVENOUS | Status: DC | PRN
  Administered 2023-03-13 (×2): 50 mg via INTRAVENOUS

## 2023-03-13 MED ORDER — HEPARIN SODIUM (PORCINE) 1000 UNIT/ML IJ SOLN
INTRAMUSCULAR | Status: DC | PRN
  Administered 2023-03-13: 14000 [IU] via INTRAVENOUS

## 2023-03-13 MED ORDER — SODIUM CHLORIDE 0.9% FLUSH: 3.0000 mL | INTRAVENOUS | Status: DC | PRN

## 2023-03-13 MED ORDER — ONDANSETRON HCL 4 MG/2ML IJ SOLN: 4.0000 mg | Freq: Four times a day (QID) | INTRAMUSCULAR | Status: DC | PRN

## 2023-03-13 MED ORDER — DIPHENHYDRAMINE HCL 50 MG/ML IJ SOLN
INTRAMUSCULAR | Status: DC | PRN
Start: 2023-03-13 — End: 2023-03-13
  Administered 2023-03-13: 25 mg via INTRAVENOUS

## 2023-03-13 MED ORDER — PROPOFOL 10 MG/ML IV BOLUS
INTRAVENOUS | Status: DC | PRN
  Administered 2023-03-13: 140 mg via INTRAVENOUS
  Administered 2023-03-13: 20 mg via INTRAVENOUS

## 2023-03-13 MED ORDER — HEPARIN (PORCINE) IN NACL 1000-0.9 UT/500ML-% IV SOLN
INTRAVENOUS | Status: DC | PRN
  Administered 2023-03-13 (×4): 500 mL

## 2023-03-13 MED ORDER — PHENYLEPHRINE HCL-NACL 20-0.9 MG/250ML-% IV SOLN
INTRAVENOUS | Status: DC | PRN
  Administered 2023-03-13: 160 ug via INTRAVENOUS
  Administered 2023-03-13: 50 ug/min via INTRAVENOUS
  Administered 2023-03-13 (×2): 160 ug via INTRAVENOUS
  Administered 2023-03-13 (×2): 80 ug via INTRAVENOUS

## 2023-03-13 MED ORDER — METHYLPREDNISOLONE SODIUM SUCC 125 MG IJ SOLR
INTRAMUSCULAR | Status: AC
  Filled 2023-03-13: qty 2

## 2023-03-13 MED ORDER — ONDANSETRON HCL 4 MG/2ML IJ SOLN
INTRAMUSCULAR | Status: DC | PRN
  Administered 2023-03-13: 4 mg via INTRAVENOUS

## 2023-03-13 MED ORDER — HEPARIN SODIUM (PORCINE) 1000 UNIT/ML IJ SOLN
INTRAMUSCULAR | Status: DC | PRN
  Administered 2023-03-13: 1000 [IU] via INTRAVENOUS

## 2023-03-13 MED ORDER — HEPARIN SODIUM (PORCINE) 1000 UNIT/ML IJ SOLN
INTRAMUSCULAR | Status: AC
  Filled 2023-03-13: qty 10

## 2023-03-13 MED ORDER — LIDOCAINE 2% (20 MG/ML) 5 ML SYRINGE
INTRAMUSCULAR | Status: DC | PRN
  Administered 2023-03-13: 100 mg via INTRAVENOUS

## 2023-03-13 MED ORDER — DEXAMETHASONE SODIUM PHOSPHATE 10 MG/ML IJ SOLN
INTRAMUSCULAR | Status: DC | PRN
  Administered 2023-03-13: 5 mg via INTRAVENOUS

## 2023-03-13 MED ORDER — DIPHENHYDRAMINE HCL 50 MG/ML IJ SOLN
INTRAMUSCULAR | Status: AC
  Filled 2023-03-13: qty 1

## 2023-03-13 MED ORDER — GLYCOPYRROLATE PF 0.2 MG/ML IJ SOSY
PREFILLED_SYRINGE | INTRAMUSCULAR | Status: DC | PRN
Start: 2023-03-13 — End: 2023-03-13
  Administered 2023-03-13: .6 mg via INTRAVENOUS

## 2023-03-13 MED ORDER — SODIUM CHLORIDE 0.9 % IV SOLN: INTRAVENOUS | Status: DC

## 2023-03-13 MED ORDER — SUGAMMADEX SODIUM 200 MG/2ML IV SOLN
INTRAVENOUS | Status: DC | PRN
  Administered 2023-03-13: 100 mg via INTRAVENOUS

## 2023-03-13 MED ORDER — NEOSTIGMINE METHYLSULFATE 3 MG/3ML IV SOSY
PREFILLED_SYRINGE | INTRAVENOUS | Status: DC | PRN
Start: 2023-03-13 — End: 2023-03-13
  Administered 2023-03-13: 3 mg via INTRAVENOUS

## 2023-03-13 MED ORDER — FAMOTIDINE 20 MG PO TABS
ORAL_TABLET | ORAL | Status: AC
  Filled 2023-03-13: qty 1

## 2023-03-13 MED ORDER — SODIUM CHLORIDE 0.9 % IV SOLN: 250.0000 mL | INTRAVENOUS | Status: DC | PRN

## 2023-03-13 MED ORDER — PROTAMINE SULFATE 10 MG/ML IV SOLN
INTRAVENOUS | Status: DC | PRN
  Administered 2023-03-13 (×2): 5 mg via INTRAVENOUS
  Administered 2023-03-13: 10 mg via INTRAVENOUS
  Administered 2023-03-13 (×4): 5 mg via INTRAVENOUS

## 2023-03-13 MED ORDER — METHYLPREDNISOLONE SODIUM SUCC 125 MG IJ SOLR
INTRAMUSCULAR | Status: DC | PRN
Start: 2023-03-13 — End: 2023-03-13
  Administered 2023-03-13: 125 mg via INTRAVENOUS

## 2023-03-13 SURGICAL SUPPLY — 20 items
BAG SNAP BAND KOVER 36X36 (MISCELLANEOUS) IMPLANT
CATH ABLAT QDOT MICRO BI TC DF (CATHETERS) IMPLANT
CATH OCTARAY 2.0 F 3-3-3-3-3 (CATHETERS) IMPLANT
CATH S-M CIRCA TEMP PROBE (CATHETERS) IMPLANT
CATH SOUNDSTAR ECO 8FR (CATHETERS) IMPLANT
CATH WEB BI DIR CSDF CRV REPRO (CATHETERS) IMPLANT
CLOSURE MYNX CONTROL 6F/7F (Vascular Products) IMPLANT
CLOSURE PERCLOSE PROSTYLE (VASCULAR PRODUCTS) IMPLANT
COVER SWIFTLINK CONNECTOR (BAG) ×2 IMPLANT
PACK EP LATEX FREE (CUSTOM PROCEDURE TRAY) ×1
PACK EP LF (CUSTOM PROCEDURE TRAY) ×2 IMPLANT
PAD DEFIB RADIO PHYSIO CONN (PAD) ×2 IMPLANT
PATCH CARTO3 (PAD) IMPLANT
SHEATH CARTO VIZIGO SM CVD (SHEATH) IMPLANT
SHEATH PINNACLE 7F 10CM (SHEATH) IMPLANT
SHEATH PINNACLE 8F 10CM (SHEATH) IMPLANT
SHEATH PINNACLE 9F 10CM (SHEATH) IMPLANT
SHEATH PROBE COVER 6X72 (BAG) IMPLANT
SHEATH WIRE KIT BAYLIS SL1 (KITS) IMPLANT
TUBING SMART ABLATE COOLFLOW (TUBING) IMPLANT

## 2023-03-13 NOTE — Transfer of Care (Signed)
Immediate Anesthesia Transfer of Care Note  Patient: Amber Bennett  Procedure(s) Performed: ATRIAL FIBRILLATION ABLATION  Patient Location: Cath Lab  Anesthesia Type:General  Level of Consciousness: awake, drowsy, and patient cooperative  Airway & Oxygen Therapy: Patient Spontanous Breathing and Patient connected to nasal cannula oxygen  Post-op Assessment: Report given to RN, Post -op Vital signs reviewed and stable, Patient moving all extremities X 4, and Patient able to stick tongue midline  Post vital signs: Reviewed and stable  Last Vitals:  Vitals Value Taken Time  BP 108/54   Temp 98.6   Pulse 55   Resp 18   SpO2 100     Last Pain:  Vitals:   03/13/23 0704  PainSc: 0-No pain      Patients Stated Pain Goal: 4 (03/13/23 0641)  Complications:  Encounter Notable Events  Notable Event Outcome Phase Comment  Difficult to intubate - unexpected  Intraprocedure Filed from anesthesia note documentation.

## 2023-03-13 NOTE — Interval H&P Note (Signed)
History and Physical Interval Note:  03/13/2023 7:03 AM  Amber Bennett  has presented today for surgery, with the diagnosis of afib.  The various methods of treatment have been discussed with the patient and family. After consideration of risks, benefits and other options for treatment, the patient has consented to  Procedure(s): ATRIAL FIBRILLATION ABLATION (N/A) as a surgical intervention.  The patient's history has been reviewed, patient examined, no change in status, stable for surgery.  I have reviewed the patient's chart and labs.  Questions were answered to the patient's satisfaction.     Amber Bennett [employer]

## 2023-03-13 NOTE — Anesthesia Procedure Notes (Addendum)
Procedure Name: Intubation Date/Time: 03/13/2023 7:49 AM  Performed by: Cy Blamer, CRNAPre-anesthesia Checklist: Patient identified, Emergency Drugs available, Suction available and Patient being monitored Patient Re-evaluated:Patient Re-evaluated prior to induction Oxygen Delivery Method: Circle system utilized Preoxygenation: Pre-oxygenation with 100% oxygen Induction Type: IV induction Ventilation: Mask ventilation without difficulty Laryngoscope Size: Miller and 2 Grade View: Grade II Tube type: Oral Number of attempts: 1 Airway Equipment and Method: Stylet and Bite block Placement Confirmation: ETT inserted through vocal cords under direct vision, positive ETCO2 and breath sounds checked- equal and bilateral Secured at: 21 cm Tube secured with: Tape Dental Injury: Injury to lip  Difficulty Due To: Difficulty was unanticipated, Difficult Airway- due to limited oral opening and Difficult Airway- due to anterior larynx Future Recommendations: Recommend- induction with short-acting agent, and alternative techniques readily available Comments: Grade IIb view w/BURP maneuver, limited view & small glottic aperature, recommend glidescope for future intubations

## 2023-03-13 NOTE — Anesthesia Postprocedure Evaluation (Signed)
Anesthesia Post Note  Patient: Amber Bennett  Procedure(s) Performed: ATRIAL FIBRILLATION ABLATION     Patient location during evaluation: PACU Anesthesia Type: General Level of consciousness: awake and alert Pain management: pain level controlled Vital Signs Assessment: post-procedure vital signs reviewed and stable Respiratory status: spontaneous breathing, nonlabored ventilation and respiratory function stable Cardiovascular status: blood pressure returned to baseline Postop Assessment: no apparent nausea or vomiting Anesthetic complications: yes   Encounter Notable Events  Notable Event Outcome Phase Comment  Difficult to intubate - unexpected  Intraprocedure Filed from anesthesia note documentation.    Last Vitals:  Vitals:   03/13/23 1042 03/13/23 1045  BP: (!) 103/46 (!) 96/56  Pulse: 71 71  Resp: 15 19  Temp:    SpO2: 93% 93%    Last Pain:  Vitals:   03/13/23 1042  TempSrc:   PainSc: 0-No pain                 Shanda Howells

## 2023-03-13 NOTE — Anesthesia Preprocedure Evaluation (Addendum)
Anesthesia Evaluation  Patient identified by MRN, date of birth, ID band Patient awake    Reviewed: Allergy & Precautions, NPO status , Patient's Chart, lab work & pertinent test results, reviewed documented beta blocker date and time   Airway Mallampati: III  TM Distance: >3 FB Neck ROM: Full    Dental  (+) Teeth Intact, Dental Advisory Given, Caps   Pulmonary former smoker   Pulmonary exam normal breath sounds clear to auscultation       Cardiovascular hypertension, Pt. on home beta blockers and Pt. on medications Normal cardiovascular exam+ dysrhythmias (xarelto) Atrial Fibrillation  Rhythm:Regular Rate:Normal     Neuro/Psych Seizures - (last seizure 2014, no missed doses of anti-epileptics), Well Controlled,  PSYCHIATRIC DISORDERS  Depression       GI/Hepatic negative GI ROS, Neg liver ROS,,,Fatty liver   Endo/Other  diabetes, Type 2    Renal/GU negative Renal ROS  negative genitourinary   Musculoskeletal  (+) Arthritis ,  Fibromyalgia -  Abdominal   Peds  Hematology negative hematology ROS (+)   Anesthesia Other Findings   Reproductive/Obstetrics                             Anesthesia Physical Anesthesia Plan  ASA: 3  Anesthesia Plan: General   Post-op Pain Management:    Induction: Intravenous  PONV Risk Score and Plan: 3 and Midazolam, Dexamethasone and Ondansetron  Airway Management Planned: Oral ETT  Additional Equipment:   Intra-op Plan:   Post-operative Plan: Extubation in OR  Informed Consent: I have reviewed the patients History and Physical, chart, labs and discussed the procedure including the risks, benefits and alternatives for the proposed anesthesia with the patient or authorized representative who has indicated his/her understanding and acceptance.     Dental advisory given  Plan Discussed with: CRNA  Anesthesia Plan Comments:        Anesthesia  Quick Evaluation

## 2023-03-13 NOTE — Discharge Instructions (Signed)

## 2023-03-13 NOTE — Progress Notes (Signed)
Patient walked to the bathroom without difficulties. Bilateral groin sites level 0, clean, dry, and intact.

## 2023-03-14 ENCOUNTER — Encounter (HOSPITAL_COMMUNITY): Payer: Self-pay | Admitting: Cardiology

## 2023-03-17 ENCOUNTER — Telehealth: Payer: Self-pay | Admitting: *Deleted

## 2023-03-17 ENCOUNTER — Encounter: Payer: Self-pay | Admitting: Cardiovascular Disease

## 2023-03-17 ENCOUNTER — Ambulatory Visit: Payer: Medicare Other | Attending: Cardiovascular Disease | Admitting: Cardiovascular Disease

## 2023-03-17 ENCOUNTER — Telehealth: Payer: Self-pay | Admitting: Emergency Medicine

## 2023-03-17 VITALS — BP 136/59 | HR 78 | Ht <= 58 in | Wt 166.2 lb

## 2023-03-17 DIAGNOSIS — G4733 Obstructive sleep apnea (adult) (pediatric): Secondary | ICD-10-CM

## 2023-03-17 DIAGNOSIS — I5033 Acute on chronic diastolic (congestive) heart failure: Secondary | ICD-10-CM | POA: Diagnosis not present

## 2023-03-17 DIAGNOSIS — I4819 Other persistent atrial fibrillation: Secondary | ICD-10-CM

## 2023-03-17 DIAGNOSIS — E669 Obesity, unspecified: Secondary | ICD-10-CM

## 2023-03-17 DIAGNOSIS — Z79899 Other long term (current) drug therapy: Secondary | ICD-10-CM | POA: Insufficient documentation

## 2023-03-17 DIAGNOSIS — Z5181 Encounter for therapeutic drug level monitoring: Secondary | ICD-10-CM

## 2023-03-17 DIAGNOSIS — E668 Other obesity: Secondary | ICD-10-CM | POA: Diagnosis not present

## 2023-03-17 DIAGNOSIS — I10 Essential (primary) hypertension: Secondary | ICD-10-CM | POA: Diagnosis not present

## 2023-03-17 DIAGNOSIS — D6869 Other thrombophilia: Secondary | ICD-10-CM

## 2023-03-17 DIAGNOSIS — R0602 Shortness of breath: Secondary | ICD-10-CM

## 2023-03-17 MED ORDER — POTASSIUM CHLORIDE CRYS ER 20 MEQ PO TBCR: EXTENDED_RELEASE_TABLET | ORAL | 3 refills | Status: AC

## 2023-03-17 MED ORDER — FUROSEMIDE 20 MG PO TABS: ORAL_TABLET | ORAL | 3 refills | Status: DC

## 2023-03-17 NOTE — Telephone Encounter (Signed)
Called and spouse answered-(dpr)- asked if Amber Bennett can come in at 1:30 today to be seen by Dr Royann Shivers. He said they will be here at 1:30.

## 2023-03-17 NOTE — Patient Instructions (Signed)
Medication Instructions:  Furosemide 40 mg this afternoon Furosemide 40 mg daily until your weight is less than 159 While taking Furosemide 40 mg daily; Take Potassium 20 meq twice daily  ONCE YOUR WEIGHT IS LESS THAN 159:  Go back to FUROSEMIDE 20 MG DAILY *If you need a refill on your cardiac medications before your next appointment, please call your pharmacy*   Lab Work: BMP- On Wednesday If you have labs (blood work) drawn today and your tests are completely normal, you will receive your results only by: MyChart Message (if you have MyChart) OR A paper copy in the mail If you have any lab test that is abnormal or we need to change your treatment, we will call you to review the results.   Follow-Up: At [facility], you and your health needs are our priority.  As part of our continuing mission to provide you with exceptional heart care, we have created designated Provider Care Teams.  These Care Teams include your primary Cardiologist (physician) and Advanced Practice Providers (APPs -  Physician Assistants and Nurse Practitioners) who all work together to provide you with the care you need, when you need it.  We recommend signing up for the patient portal called "MyChart".  Sign up information is provided on this After Visit Summary.  MyChart is used to connect with patients for Virtual Visits (Telemedicine).  Patients are able to view lab/test results, encounter notes, upcoming appointments, etc.  Non-urgent messages can be sent to your provider as well.   To learn more about what you can do with MyChart, go to [email or web address].

## 2023-03-17 NOTE — Telephone Encounter (Signed)
Dr. Elberta Bennett received a message from pt's son (Dr. Clelia Croft) about her current condition. Pt is SOB, having difficulty walking, weight is up 7 pds, lung crackles, puffy hands. He had her take Lasix 40 mg yesterday.  Spoke to pt to confirm she is aware of her appt today with Dr. Royann Shivers at 1:30 pm She confirms and agreeable to plan.

## 2023-03-17 NOTE — Progress Notes (Unsigned)
Cardiology Office Note:  .   Date:  03/18/2023  ID:  Amber Bennett, DOB [birth year], MRN [id number] PCP: Amber Ran, MD  [facility] Providers Cardiologist:  None    History of Present Illness: Amber Bennett   Amber Bennett is a 72 y.o. female with recent onset paroxysmal atrial fibrillation with rapid ventricular response complicated by acute exacerbation of diastolic heart failure on a background history of hypertension, hypercholesterolemia, mild obesity, recently diagnosed obstructive sleep apnea.  She underwent atrial fibrillation ablation without immediate complications last week, but she has subsequently developed significant shortness of breath with minimal activity, orthopnea and wheezing.  On her home scale her weight peaked at about 166 pounds, which seems to be about 12-14 pounds over her usual baseline.  After taking a single dose of furosemide 20 mg yesterday she is lost 1.5 pounds and her breathing is a little bit better.  She typically weighs around 155 pounds on her home scale and today on our office scale weighs 166 pounds.  She does not have overt edema, but she does have audible wheezing and is tachypneic.  Recently performed echocardiogram and a Lexiscan Myoview showed normal left ventricular systolic function, diastolic function assessment was limited due to the arrhythmia but the mitral annulus velocities were not bad (medial E prime 8.3, lateral E prime 9.76), mildly dilated left atrium (end-systolic diameter 4.3 cm, biplane end-systolic volume index 34 mL/m) and no evidence  of ischemia on her nuclear images.  The home sleep study was abnormal (mild OSA with AHI 11.5/hour, but with oxygen desaturation as low as 70%), but she has yet to be scheduled for the CPAP titration testing.  ROS: Has not had palpitations or tachycardia since her ablation procedure.  Studies Reviewed: .        Echocardiogram 01/20/2023  1. Left ventricular ejection fraction, by estimation, is 60 to  65%. The  left ventricle has normal function. The left ventricle has no regional  wall motion abnormalities. There is mild asymmetric left ventricular  hypertrophy of the basal-septal segment.  Left ventricular diastolic parameters are indeterminate.   2. Right ventricular systolic function is normal. The right ventricular  size is normal.   3. Left atrial size was mildly dilated.   4. The mitral valve is grossly normal. Mild mitral valve regurgitation.  No evidence of mitral stenosis.   5. The aortic valve is tricuspid. Aortic valve regurgitation is mild.   6. The inferior vena cava is normal in size with greater than 50%  respiratory variability, suggesting right atrial pressure of 3 mmHg.   Comparison(s): No prior Echocardiogram.   Lexiscan Myoview 01/20/2023:   No ischemia. There is a small mild fixed mid-anterior defect that is most consistent with breast attenuation given normal wall motion. The study is overall low risk due to grossly normal perfusion, normal LV function and no TID. Patient was symptomatic after stress testing, see separate documentation from Amber Bennett. If clinical concern persists, consider cardiac PET-CT with myocardial blood flow or coronary CTA.   No ST deviation was noted.   LV perfusion is abnormal. Defect 1: There is a small defect with mild reduction in uptake present in the mid anterior location(s) that is fixed. There is normal wall motion in the defect area. Consistent with artifact caused by breast attenuation.   Left ventricular function is normal. Nuclear stress EF: 69%. The left ventricular ejection fraction is hyperdynamic (>65%). End diastolic cavity size is normal. End systolic cavity size is normal.  Prior study not available for comparison.  Risk Assessment/Calculations:    CHA2DS2-VASc Score = 4   This indicates a 4.8% annual risk of stroke. The patient's score is based upon: CHF History: 1 HTN History: 1 Diabetes History: 0 Stroke  History: 0 Vascular Disease History: 0 Age Score: 1 Gender Score: 1            Physical Exam:   VS:  BP (!) 136/59   Pulse 78   Ht 4\' 10"  (1.473 m)   Wt 166 lb 3.2 oz (75.4 kg)   LMP 07/01/1980   SpO2 96%   BMI 34.74 kg/m    Wt Readings from Last 3 Encounters:  03/17/23 166 lb 3.2 oz (75.4 kg)  03/13/23 160 lb (72.6 kg)  02/20/23 164 lb (74.4 kg)    GEN: Well nourished, well developed in no acute distress, mildly obese.  She is tachypneic at rest NECK: 8-10 cm JVD; No carotid bruits CARDIAC: RRR, no murmurs, rubs, gallops RESPIRATORY: A few scattered rales in both bases ABDOMEN: Soft, non-tender, non-distended EXTREMITIES:  No edema; No deformity   ASSESSMENT AND PLAN: .   CHF: She has clinical evidence of hypervolemia/heart failure and based on her weight has roughly 10 pounds of extra fluid.  I think her shortness of breath is due to heart failure, not due to amiodarone side effects.  Will have her take 40 mg of furosemide tonight, another 40 mg of furosemide tomorrow morning, when her home weight decreases to 159 pounds we will have her then decrease the dose to 20 mg daily.  Increase the potassium chloride to 20 mill equivalents daily.  Check labs later this week.  Previously she had a transient episode of heart failure exacerbation while at the beach, improved with diuretics, but developed symptomatic hypotension when we overdiuresed her.  Scheduled an appointment for follow-up with Amber Bennett on 04/03/2023.  AFib: s/p recent ablation.  On anticoagulation.  Has an appointment in the A-fib clinic 04/10/2023.  Appointment scheduled with Dr. Elberta Bennett 07/15/2023.  We discussed the fact that bursts of irregular rhythm are common in the first 3 months after ablation.  Still on amiodarone, hopefully will be able to discontinue that in the future. Anticoagulation: Needs to remain compliant with her anticoagulant (Xarelto).  No bleeding problems to date.  She does have a history of  remote GI bleeding when she took aspirin for joint complaints.  Trying to avoid NSAIDs, but unfortunately she cannot take acetaminophen either due to an allergic response.  Wants to avoid opiates.  In the long run, may be well served by a Watchman procedure to avoid the need for anticoagulation.  We discussed this briefly today. HTN: Blood pressure is adequately controlled.. OSA:   Has not yet undergone the CPAP titration trial.  Split-night study in 2019 showed AHI index of only 2/h, but home sleep study performed a couple of months ago showed AHI of 11/h, with oxygen desaturation down to 70%. Obesity: She has had no benefit from treatment with semaglutide.  Trying to get on Mounjaro, but was denied by insurance.     Signed, Thurmon Fair, MD

## 2023-03-19 DIAGNOSIS — R0602 Shortness of breath: Secondary | ICD-10-CM | POA: Diagnosis not present

## 2023-03-19 DIAGNOSIS — Z79899 Other long term (current) drug therapy: Secondary | ICD-10-CM | POA: Diagnosis not present

## 2023-03-19 DIAGNOSIS — Z5181 Encounter for therapeutic drug level monitoring: Secondary | ICD-10-CM | POA: Diagnosis not present

## 2023-03-20 ENCOUNTER — Ambulatory Visit: Payer: Medicare Other | Admitting: Cardiovascular Disease

## 2023-03-20 LAB — BASIC METABOLIC PANEL
BUN/Creatinine Ratio: 22 (ref 12–28)
BUN: 17 mg/dL (ref 8–27)
CO2: 23 mmol/L (ref 20–29)
Calcium: 8.8 mg/dL (ref 8.7–10.3)
Chloride: 98 mmol/L (ref 96–106)
Creatinine, Ser: 0.78 mg/dL (ref 0.57–1.00)
Glucose: 106 mg/dL — ABNORMAL HIGH (ref 70–99)
Potassium: 4 mmol/L (ref 3.5–5.2)
Sodium: 137 mmol/L (ref 134–144)
eGFR: 80 mL/min/{1.73_m2} (ref >59)

## 2023-03-24 ENCOUNTER — Other Ambulatory Visit: Payer: Self-pay | Admitting: *Deleted

## 2023-03-24 MED ORDER — TOPIRAMATE 50 MG PO TABS: ORAL_TABLET | ORAL | 2 refills | Status: DC

## 2023-04-01 DIAGNOSIS — Z23 Encounter for immunization: Secondary | ICD-10-CM | POA: Diagnosis not present

## 2023-04-01 NOTE — Progress Notes (Unsigned)
Cardiology Clinic Note   Patient Name: KAIULANI SITTON Date of Encounter: 04/03/2023  Primary Care Provider:  Rodrigo Ran, MD Primary Cardiologist:  Thurmon Fair, MD  Patient Profile    NAKEYIA MENDEN 72 year old female presents the clinic today for follow-up evaluation of her diastolic CHF.  Past Medical History    Past Medical History:  Diagnosis Date   Atherosclerosis of aorta (HCC)    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   Carpal tunnel syndrome    Cervical radiculitis    Depression    Dermatomyositis (HCC)    Diabetes mellitus without complication (HCC)    Elevated liver enzymes    External hemorrhoids    Fatty liver    Fibromyalgia affecting hand 08/2022   bilateral wrist and hands   Hematuria    negative evaluation   High cholesterol    HTN (hypertension)    Internal hemorrhoids    Laryngitis    Lower back pain    Macular degeneration    Osteopenia    Left side hip   Seizures (HCC)    09/07/2019 pt reports last seizure in 2014   Tubular adenoma of colon    Past Surgical History:  Procedure Laterality Date   ABDOMINAL HYSTERECTOMY  1982   APPENDECTOMY     ATRIAL FIBRILLATION ABLATION N/A 03/13/2023   Procedure: ATRIAL FIBRILLATION ABLATION;  Surgeon: Regan Lemming, MD;  Location: [facility];  Service: Cardiovascular;  Laterality: N/A;   BACK SURGERY     BACK SURGERY  07/2015   CARPAL TUNNEL RELEASE     CATARACT EXTRACTION, BILATERAL     CHOLECYSTECTOMY  2003   COLONOSCOPY     EXPLORATORY LAPAROTOMY     LAMINECTOMY     SHOULDER SURGERY Right 2000   TONSILLECTOMY     TOTAL HIP ARTHROPLASTY Right 02/12/2022   Procedure: RIGHT TOTAL HIP ARTHROPLASTY ANTERIOR APPROACH;  Surgeon: Kathryne Hitch, MD;  Location: [facility] OR;  Service: Orthopedics;  Laterality: Right;    Allergies  Allergies  Allergen Reactions   Tylenol [Acetaminophen] Swelling and Other (See Comments)    Throat swelling, anaphylaxis   Aspirin Other (See Comments)     gi bleeding in 2005   Cefdinir Other (See Comments)    Other Reaction(s): hives, itching   Colesevelam Other (See Comments)    Other Reaction(s): has never tried it but would not be a good choice as all seizure meds would have to be taken at least four hours prior to the dose and with her regimen that just won't be possible.   Ezetimibe-Simvastatin Other (See Comments)    Other Reaction(s): elevated LFT   Penicillin G Benzathine Itching   Rosuvastatin     Other Reaction(s): skin rash and myalgia   Wound Dressing Adhesive Other (See Comments)   Latex Itching    Rash     History of Present Illness    ZAMARIYA NEAL has a PMH of HTN, paroxysmal atrial fibrillation, thoracic or lumbosacral neuritis, hyperlipidemia, diastolic CHF, mild obesity, and OSA.  Her nuclear stress test 7/24 showed small defect with mild reduction in uptake in the mid anterior location.  EF was noted to be hyperdynamic and diastolic parameters were normal.  No evidence of ischemia.  Her PMH also includes A-fib ablation 9/24.  She was seen in follow-up by Dr. Royann Shivers on 03/17/2023.  During that time she was following up for A-fib ablation.  She reported significant shortness of breath with minimal  activity, orthopnea, and wheezing.  She reported a 12 to 14 pound weight increase.  She had lost about 1.5 pounds after taking a single dose of furosemide.  She felt that her breathing was some better.  Her dry weight is around 155 pounds.  Our office scale showed 166 pounds.  She was not noted to have edema however, audible wheezing and tachypnea were noted.  She was instructed to take 40 mg of furosemide x 2 days and 20 mill equivalents of potassium twice daily.  Once her weight reached 1 59 pounds she was instructed to decrease her furosemide dose to 20 and her potassium to 20 mill equivalents daily.  BMP was ordered and showed a potassium of 4.0 with a creatinine of 0.78.  On 03/19/2023.  She presents to the clinic today for  follow-up evaluation and states she has felt some improvement with losing 2 pounds.  She reports that her breathing is better.  She still notices increased work of breathing with increased physical activity.  Her ankles continue to be somewhat swollen.  She also notices that her rings have not been fitting well and are tight.  We reviewed the importance of low-sodium diet and daily weights.  She has been making her meals at home and reduced her fluid intake.  I will continue her current Lasix and potassium regimen.  I will order a BMP and plan follow-up in 1 to 2 months..  Today she denies chest pain, increase shortness of breath,  fatigue, palpitations, melena, hematuria, hemoptysis, diaphoresis, weakness, presyncope, syncope, orthopnea, and PND.   Home Medications    Prior to Admission medications   Medication Sig Start Date End Date Taking? Authorizing Provider  amiodarone (PACERONE) 200 MG tablet Take 400 mg a day (2 tablets) for 3 weeks, then decrease to 200 mg daily (1 tablet) Patient taking differently: Take 200 mg by mouth daily. 01/24/23   Croitoru, Mihai, MD  amLODipine (NORVASC) 2.5 MG tablet Take 2.5 mg by mouth at bedtime. Patient not taking: Reported on 03/17/2023 03/07/23   [provider]  Ascorbic Acid (VITAMIN C) 1000 MG tablet Take 1,000 mg by mouth daily. [employer] educational needs teacher, Historical, MD  Calcium Carb-Cholecalciferol (CALTRATE 600+D3 PO) Take 1 tablet by mouth daily.    [provider]  Cholecalciferol (VITAMIN D) 50 MCG (2000 UT) tablet Take 2,000 Units by mouth daily.    [provider]  Coenzyme Q10 (CO Q 10 PO) Take 200 mg by mouth daily.    [provider]  diphenoxylate-atropine (LOMOTIL) 2.5-0.025 MG tablet Take 1 tablet by mouth 4 (four) times daily as needed for diarrhea or loose stools. Patient not taking: Reported on 03/17/2023 07/11/22   Esterwood, Amy S, PA-C  DULoxetine (CYMBALTA) 30 MG capsule Take 30 mg by mouth every  evening. 12/28/15   [provider]  ECHINACEA PO Take 900 mg by mouth daily.    [provider]  ezetimibe (ZETIA) 10 MG tablet Take 10 mg by mouth every evening.    [provider]  fluticasone (FLONASE) 50 MCG/ACT nasal spray Place 2 sprays into both nostrils daily.    [provider]  furosemide (LASIX) 20 MG tablet Take 40 mg of Furosemide daily  until your weight is less than 159 pounds.  Once your weight is less than 159 pounds, take only 20 mg of Furosemide daily 03/17/23   Croitoru, Mihai, MD  gabapentin (NEURONTIN) 800 MG tablet Take 1 tablet (800 mg total)  by mouth 2 (two) times daily. Patient taking differently: Take 800 mg by mouth at bedtime. Additional 400 mg if needed in the middle of the night 12/20/21   Butch Penny, NP  hyoscyamine (LEVSIN SL) 0.125 MG SL tablet Place 1 tablet (0.125 mg total) under the tongue every 6 (six) hours as needed. 07/11/22   Esterwood, Amy S, PA-C  ibandronate (BONIVA) 150 MG tablet Take 150 mg by mouth every 30 (thirty) days. 01/25/23   [provider]  loratadine (CLARITIN) 10 MG tablet Take 10 mg by mouth daily.    [provider]  metoprolol tartrate (LOPRESSOR) 50 MG tablet Take 1 tablet (50 mg total) by mouth 2 (two) times daily. 02/07/23   Croitoru, Mihai, MD  montelukast (SINGULAIR) 10 MG tablet Take 10 mg by mouth at bedtime. 11/21/12   [provider]  MOUNJARO 2.5 MG/0.5ML Pen Inject 2.5 mg into the skin once a week. 02/11/23   [provider]  Multiple Vitamins-Minerals (MULTIVITAMIN PO) Take 1 tablet by mouth daily.    [provider]  Multiple Vitamins-Minerals (PRESERVISION AREDS PO) Take 1 capsule by mouth daily.    [provider]  omega-3 acid ethyl esters (LOVAZA) 1 G capsule Take 2 g by mouth 2 (two) times daily. 11/08/12   [provider]  omeprazole (PRILOSEC) 20 MG capsule Take 20 mg by mouth 2 (two) times daily before a meal. 08/19/22 03/13/23   [provider]  PHENobarbital (LUMINAL) 97.2 MG tablet TAKE 1 AND 1/2 TABLETS BY MOUTH EVERY DAY Patient taking differently: Take 145.8 mg by mouth at bedtime. 10/15/22   Levert Feinstein, MD  Polyethyl Glycol-Propyl Glycol (SYSTANE ULTRA) 0.4-0.3 % SOLN Place 1 drop into both eyes in the morning, at noon, and at bedtime.    [provider]  potassium chloride SA (KLOR-CON M) 20 MEQ tablet While taking 40 mg of Furosemide, take Potassium Chloride 20 meq twice a day. When you decrease Furosemide to 20 mg, decrease Potassium Chloride to 20 meq once a day. 03/17/23   Croitoru, Mihai, MD  PRALUENT 150 MG/ML SOAJ Inject 150 mg into the skin every 14 (fourteen) days. 10/03/21   [provider]  Probiotic Product (PROBIOTIC PO) Take 1 capsule by mouth daily.    [provider]  pyridoxine (B-6) 100 MG tablet Take 100 mg by mouth daily.    [provider]  rivaroxaban (XARELTO) 20 MG TABS tablet Take 1 tablet (20 mg total) by mouth daily with supper. 12/17/22   O'Neal, Ronnald Ramp, MD  sodium chloride (OCEAN) 0.65 % SOLN nasal spray Place 1 spray into both nostrils daily as needed for congestion. Patient not taking: Reported on 03/17/2023    [provider]  tiZANidine (ZANAFLEX) 4 MG tablet Take 1 tablet (4 mg total) by mouth 2 (two) times daily as needed for muscle spasms. 01/30/23   Kathryne Hitch, MD  topiramate (TOPAMAX) 50 MG tablet TAKE 1 TABLET BY MOUTH EVERY MORNING AND 2 TABLETS EVERY EVENING 03/24/23   Butch Penny, NP  vitamin E 400 UNIT capsule Take 400 Units by mouth daily.    [provider]  zinc gluconate 50 MG tablet Take 50 mg by mouth daily.    [provider]    Family History    Family History  Problem Relation Age of Onset   Cancer Mother        Cancer of the bone marrow   Heart attack Mother    Stroke Sister  Hypertension Sister    Diabetes Sister    Colon cancer Maternal Aunt    Seizures Neg Hx     Esophageal cancer Neg Hx    Rectal cancer Neg Hx    Stomach cancer Neg Hx    She indicated that her mother is deceased. She indicated that her father is deceased. She indicated that her sister is alive. She indicated that the status of her maternal aunt is unknown. She indicated that the status of her neg hx is unknown.  Social History    Social History   Socioeconomic History   Marital status: Married    Spouse name: Not on file   Number of children: 2   Years of education: 14   Highest education level: Not on file  Occupational History   Occupation: Employed    Comment: Works for [employer], [employer] and [employer]  Tobacco Use   Smoking status: Former    Current packs/day: 0.00    Types: Cigarettes    Quit date: 07/01/1978    Years since quitting: 44.7   Smokeless tobacco: Never  Vaping Use   Vaping status: Never Used  Substance and Sexual Activity   Alcohol use: Not Currently    Alcohol/week: 7.0 standard drinks of alcohol    Types: 7 Glasses of wine per week   Drug use: No   Sexual activity: Yes    Partners: Male    Birth control/protection: Surgical    Comment: TAH  Other Topics Concern   Not on file  Social History Narrative   Patient lives at home with her husband Cloyd Stagers).   Retired.   Education two years of business college.   Right handed.   Caffeine     Social Determinants of Health   Financial Resource Strain: Low Risk  (06/17/2018)   Received from [facility], [facility]   Overall Financial Resource Strain (CARDIA)    Difficulty of Paying Living Expenses: Not hard at all  Food Insecurity: No Food Insecurity (06/17/2018)   Received from [facility], [facility]   Hunger Vital Sign    Worried About Running Out of Food in the Last Year: Never true    Ran Out of Food in the Last Year: Never true  Transportation Needs: No Transportation Needs (06/17/2018)   Received from [facility], [facility]   [facility] - Transportation    In the past 12 months, has lack of transportation kept you from medical appointments or from getting medications?: No    Lack of Transportation (Non-Medical): No  Physical Activity: Not on file  Stress: No Stress Concern Present (06/17/2018)   Received from [facility], [facility]   [employer] of Occupational Health - Occupational Stress Questionnaire    Feeling of Stress : Only a little  Social Connections: Unknown (06/17/2018)   Received from [facility], [facility]   Social Connection and Isolation Panel [NHANES]    Frequency of Communication with Friends and Family: Not on file    Frequency of Social Gatherings with Friends and Family: Not on file    Attends Religious Services: Not on file    Active Member of Clubs or Organizations: Not on file    Attends Banker Meetings: Not on file    Marital Status: Married  Intimate Partner Violence: Not on file     Review of Systems  General:  No chills, fever, night sweats or weight changes.  Cardiovascular:  No chest pain, dyspnea on exertion, edema, orthopnea, palpitations, paroxysmal nocturnal dyspnea. Dermatological: No rash, lesions/masses Respiratory: No cough, dyspnea Urologic: No hematuria, dysuria Abdominal:   No nausea, vomiting, diarrhea, bright red blood per rectum, melena, or hematemesis Neurologic:  No visual changes, wkns, changes in mental status. All other systems reviewed and are otherwise negative except as noted above.  Physical Exam    VS:  BP 134/70 (BP Location: Left Arm, Patient Position: Sitting, Cuff Size: Normal)   Pulse 77   Ht 4\' 10"  (1.473 m)   Wt 164 lb 3.2 oz (74.5 kg)   LMP 07/01/1980   SpO2 98%   BMI 34.32 kg/m  , BMI Body mass index is 34.32 kg/m. GEN: Well nourished, well developed, in no acute distress. HEENT:  normal. Neck: Supple, no JVD, carotid bruits, or masses. Cardiac: RRR, no murmurs, rubs, or gallops. No clubbing, cyanosis, generalized bilateral lower extremity ankle edema.  Radials/DP/PT 2+ and equal bilaterally.  Respiratory:  Respirations regular and unlabored, clear to auscultation bilaterally. GI: Soft, nontender, nondistended, BS + x 4. MS: no deformity or atrophy. Skin: warm and dry, no rash. Neuro:  Strength and sensation are intact. Psych: Normal affect.  Accessory Clinical Findings    Recent Labs: 12/12/2022: TSH 1.090 02/27/2023: Hemoglobin 12.8; Platelets 156 03/19/2023: BUN 17; Creatinine, Ser 0.78; Potassium 4.0; Sodium 137   Recent Lipid Panel No results found for: "CHOL", "TRIG", "HDL", "CHOLHDL", "VLDL", "LDLCALC", "LDLDIRECT"       ECG personally reviewed by me today-none today.     Echocardiogram 01/20/2023  1. Left ventricular ejection fraction, by estimation, is 60 to 65%. The  left ventricle has normal function. The left ventricle has no regional  wall motion abnormalities. There is mild asymmetric left ventricular  hypertrophy of the basal-septal segment.  Left ventricular diastolic parameters are indeterminate.   2. Right ventricular systolic function is normal. The right ventricular  size is normal.   3. Left atrial size was mildly dilated.   4. The mitral valve is grossly normal. Mild mitral valve regurgitation.  No evidence of mitral stenosis.   5. The aortic valve is tricuspid. Aortic valve regurgitation is mild.   6. The inferior vena cava is normal in size with greater than 50%  respiratory variability, suggesting right atrial pressure of 3 mmHg.   Comparison(s): No prior Echocardiogram.    Lexiscan Myoview 01/20/2023:    No ischemia. There is a small mild fixed mid-anterior defect that is most consistent with breast attenuation given normal wall motion. The study is overall low risk due to grossly normal perfusion, normal LV function and no  TID. Patient was symptomatic after stress testing, see separate documentation from Dr. Royann Shivers. If clinical concern persists, consider cardiac PET-CT with myocardial blood flow or coronary CTA.   No ST deviation was noted.   LV perfusion is abnormal. Defect 1: There is a small defect with mild reduction in uptake present in the mid anterior location(s) that is fixed. There is normal wall motion in the defect area. Consistent with artifact caused by breast attenuation.   Left ventricular function is normal. Nuclear stress EF: 69%. The left ventricular ejection fraction is hyperdynamic (>65%). End diastolic cavity size is normal. End systolic cavity size is normal.   Prior study not available for comparison.     Assessment & Plan   1.  Acute on chronic diastolic CHF-weight today 164.2.  Breathing has  improved some.  Notices some shortness of breath with increased physical activity. Daily weights-for a weight increase of 2 to 3 pounds currently taking 40 of Lasix twice daily and 20 mill equivalents of potassium with each 40 mg dose.  Then will transition to 20 mg of Lasix daily.  (When weight returns to 159 pounds) low-sodium diet Continue metoprolol Order BMP  Persistent atrial fibrillation-heart rate today 77.  Reports compliance with anticoagulation.  Denies bleeding issues.  CHA2DS2-VASc score 4 (CHF, HTN, age, gender) Continue Xarelto, metoprolol, amiodarone Avoid triggers caffeine, chocolate, EtOH, dehydration etc.  Essential hypertension-BP today 134/70. Maintain blood pressure log Continue metoprolol, amlodipine  OSA-has been compliant with CPAP. Like the device and feels it is working well. Waking up still some fatigued.  09:52 usage avg  with 0.4 ahi Avoid supine sleeping Elevate head of bed Sleep hygiene instructions  Disposition: Follow-up with Dr. Royann Shivers or me in 1-2 months.  Follow-up with EP as scheduled.   Thomasene Ripple. Reylynn Vanalstine NP-C     04/03/2023, 3:50 PM [facility] [address] Office [phone] Fax [phone]    I spent 13 minutes examining this patient, reviewing medications, and using patient centered shared decision making involving her cardiac care.  Prior to her visit I spent greater than 20 minutes reviewing her past medical history,  medications, and prior cardiac tests.

## 2023-04-03 ENCOUNTER — Encounter: Payer: Self-pay | Admitting: General Practice

## 2023-04-03 ENCOUNTER — Ambulatory Visit: Payer: Medicare Other | Attending: General Practice | Admitting: General Practice

## 2023-04-03 VITALS — BP 134/70 | HR 77 | Ht <= 58 in | Wt 164.2 lb

## 2023-04-03 DIAGNOSIS — I5033 Acute on chronic diastolic (congestive) heart failure: Secondary | ICD-10-CM | POA: Diagnosis not present

## 2023-04-03 DIAGNOSIS — I4819 Other persistent atrial fibrillation: Secondary | ICD-10-CM

## 2023-04-03 DIAGNOSIS — I10 Essential (primary) hypertension: Secondary | ICD-10-CM | POA: Diagnosis not present

## 2023-04-03 DIAGNOSIS — G4733 Obstructive sleep apnea (adult) (pediatric): Secondary | ICD-10-CM

## 2023-04-03 MED ORDER — FUROSEMIDE 20 MG PO TABS: ORAL_TABLET | ORAL | 3 refills | Status: DC

## 2023-04-03 NOTE — Patient Instructions (Signed)
Medication Instructions:  Take 40 mg TWICE DAILY of Furosemide until your weight is less than 159 pounds.  Once your weight is less than 159 pounds, take only 20 mg of Furosemide daily *If you need a refill on your cardiac medications before your next appointment, please call your pharmacy*  Lab Work: BMET TODAY If you have labs (blood work) drawn today and your tests are completely normal, you will receive your results only by:      MyChart Message (if you have MyChart) OR  A paper copy in the mail If you have any lab test that is abnormal or we need to change your treatment, we will call you to review the results.  Other Instructions TAKE AND LOG YOUR WEIGHT DAILY  Follow-Up: At [facility], you and your health needs are our priority.  As part of our continuing mission to provide you with exceptional heart care, we have created designated Provider Care Teams.  These Care Teams include your primary Cardiologist (physician) and Advanced Practice Providers (APPs -  Physician Assistants and Nurse Practitioners) who all work together to provide you with the care you need, when you need it.  Your next appointment:   1-2 month(s)  Provider:   Thurmon Fair, MD  or Edd Fabian, FNP

## 2023-04-04 LAB — BASIC METABOLIC PANEL
BUN/Creatinine Ratio: 32 — ABNORMAL HIGH (ref 12–28)
BUN: 23 mg/dL (ref 8–27)
CO2: 24 mmol/L (ref 20–29)
Calcium: 9.1 mg/dL (ref 8.7–10.3)
Chloride: 94 mmol/L — ABNORMAL LOW (ref 96–106)
Creatinine, Ser: 0.71 mg/dL (ref 0.57–1.00)
Glucose: 103 mg/dL — ABNORMAL HIGH (ref 70–99)
Potassium: 4.7 mmol/L (ref 3.5–5.2)
Sodium: 135 mmol/L (ref 134–144)
eGFR: 89 mL/min/{1.73_m2} (ref >59)

## 2023-04-08 DIAGNOSIS — M791 Myalgia, unspecified site: Secondary | ICD-10-CM | POA: Diagnosis not present

## 2023-04-08 DIAGNOSIS — Z6833 Body mass index (BMI) 33.0-33.9, adult: Secondary | ICD-10-CM | POA: Diagnosis not present

## 2023-04-10 ENCOUNTER — Ambulatory Visit (HOSPITAL_COMMUNITY)
Admission: RE | Admit: 2023-04-10 | Discharge: 2023-04-10 | Disposition: A | Discharge status: Home / Self Care | Payer: Medicare Other | Source: Ambulatory Visit | Attending: Physician Assistant | Admitting: Physician Assistant

## 2023-04-10 ENCOUNTER — Encounter (HOSPITAL_COMMUNITY): Payer: Self-pay | Admitting: Physician Assistant

## 2023-04-10 VITALS — BP 148/88 | HR 71 | Ht <= 58 in | Wt 161.0 lb

## 2023-04-10 DIAGNOSIS — Z6833 Body mass index (BMI) 33.0-33.9, adult: Secondary | ICD-10-CM | POA: Insufficient documentation

## 2023-04-10 DIAGNOSIS — Z7901 Long term (current) use of anticoagulants: Secondary | ICD-10-CM | POA: Insufficient documentation

## 2023-04-10 DIAGNOSIS — D6869 Other thrombophilia: Secondary | ICD-10-CM | POA: Diagnosis not present

## 2023-04-10 DIAGNOSIS — G4733 Obstructive sleep apnea (adult) (pediatric): Secondary | ICD-10-CM | POA: Diagnosis not present

## 2023-04-10 DIAGNOSIS — I700 Atherosclerosis of aorta: Secondary | ICD-10-CM | POA: Insufficient documentation

## 2023-04-10 DIAGNOSIS — I4892 Unspecified atrial flutter: Secondary | ICD-10-CM | POA: Insufficient documentation

## 2023-04-10 DIAGNOSIS — I110 Hypertensive heart disease with heart failure: Secondary | ICD-10-CM | POA: Diagnosis not present

## 2023-04-10 DIAGNOSIS — I480 Paroxysmal atrial fibrillation: Secondary | ICD-10-CM

## 2023-04-10 DIAGNOSIS — E669 Obesity, unspecified: Secondary | ICD-10-CM | POA: Insufficient documentation

## 2023-04-10 DIAGNOSIS — I5032 Chronic diastolic (congestive) heart failure: Secondary | ICD-10-CM | POA: Diagnosis not present

## 2023-04-10 DIAGNOSIS — E785 Hyperlipidemia, unspecified: Secondary | ICD-10-CM | POA: Insufficient documentation

## 2023-04-10 DIAGNOSIS — Z5181 Encounter for therapeutic drug level monitoring: Secondary | ICD-10-CM

## 2023-04-10 DIAGNOSIS — Z79899 Other long term (current) drug therapy: Secondary | ICD-10-CM | POA: Diagnosis not present

## 2023-04-10 NOTE — Progress Notes (Signed)
Primary Care Physician: Rodrigo Ran, MD Primary Cardiologist: Dr. Royann Shivers Electrophysiologist: Will Jorja Loa, MD  Referring Physician: Dr. Claudean Kinds is a 72 y.o. female with a history of HTN, HLD, aortic atherosclerosis, mild obesity, OSA on CPAP, atrial flutter, atrial fibrillation who presents for follow up in the [facility]. Patient was started on Multaq for frequent episodes of Afib with RVR but did not help with burden. She was started on amiodarone by Dr. Royann Shivers on 01/24/23. Patient is on Xarelto for a CHADS2VASC score of 4.   On follow up today, patient is s/p afib and flutter ablation with Dr Elberta Fortis on 03/13/23. She reports that she has felt "much better" since her ablation. She is able to walk much further without becoming SOB. She denies chest pain, swallowing pain, or groin issues. She did have fluid retention post ablation and her diuretics were increased. Her weight is slowly trending down.   Today, she denies symptoms of palpitations, shortness of breath, orthopnea, PND, lower extremity edema, dizziness, presyncope, syncope, snoring, daytime somnolence, bleeding, or neurologic sequela. The patient is tolerating medications without difficulties and is otherwise without complaint today.    Atrial Fibrillation Risk Factors:  she does have symptoms or diagnosis of sleep apnea. she is compliant with CPAP therapy.    Current Outpatient Medications  Medication Sig Dispense Refill   amiodarone (PACERONE) 200 MG tablet Take 400 mg a day (2 tablets) for 3 weeks, then decrease to 200 mg daily (1 tablet) 180 tablet 1   amLODipine (NORVASC) 2.5 MG tablet Take 2.5 mg by mouth at bedtime.     Ascorbic Acid (VITAMIN C) 1000 MG tablet Take 1,000 mg by mouth daily. Airborne emergen c     Calcium Carb-Cholecalciferol (CALTRATE 600+D3 PO) Take 1 tablet by mouth daily.     Cholecalciferol (VITAMIN D) 50 MCG (2000 UT) tablet Take 2,000  Units by mouth daily.     Coenzyme Q10 (CO Q 10 PO) Take 200 mg by mouth daily.     diphenoxylate-atropine (LOMOTIL) 2.5-0.025 MG tablet Take 1 tablet by mouth 4 (four) times daily as needed for diarrhea or loose stools. 90 tablet 0   DULoxetine (CYMBALTA) 30 MG capsule Take 30 mg by mouth every evening.  5   ECHINACEA PO Take 900 mg by mouth daily.     ezetimibe (ZETIA) 10 MG tablet Take 10 mg by mouth every evening.     fluticasone (FLONASE) 50 MCG/ACT nasal spray Place 2 sprays into both nostrils daily.     furosemide (LASIX) 20 MG tablet Take 40 mg TWICE DAILY of Furosemide until your weight is less than 159 pounds.  Once your weight is less than 159 pounds, take only 20 mg of Furosemide daily 180 tablet 3   gabapentin (NEURONTIN) 800 MG tablet Take 1 tablet (800 mg total) by mouth 2 (two) times daily. (Patient taking differently: Take 800 mg by mouth at bedtime. Additional 400 mg if needed in the middle of the night) 180 tablet 3   hyoscyamine (LEVSIN SL) 0.125 MG SL tablet Place 1 tablet (0.125 mg total) under the tongue every 6 (six) hours as needed. 30 tablet 6   ibandronate (BONIVA) 150 MG tablet Take 150 mg by mouth every 30 (thirty) days.     loratadine (CLARITIN) 10 MG tablet Take 10 mg by mouth daily.     metoprolol tartrate (LOPRESSOR) 50 MG tablet Take 1 tablet (50 mg total) by mouth 2 (  two) times daily.     montelukast (SINGULAIR) 10 MG tablet Take 10 mg by mouth at bedtime.     MOUNJARO 5 MG/0.5ML Pen Inject into the skin.     Multiple Vitamins-Minerals (MULTIVITAMIN PO) Take 1 tablet by mouth daily.     Multiple Vitamins-Minerals (PRESERVISION AREDS PO) Take 1 capsule by mouth daily.     omega-3 acid ethyl esters (LOVAZA) 1 G capsule Take 2 g by mouth 2 (two) times daily.     PHENobarbital (LUMINAL) 97.2 MG tablet TAKE 1 AND 1/2 TABLETS BY MOUTH EVERY DAY (Patient taking differently: Take 145.8 mg by mouth at bedtime.) 135 tablet 3   Polyethyl Glycol-Propyl Glycol (SYSTANE ULTRA)  0.4-0.3 % SOLN Place 1 drop into both eyes in the morning, at noon, and at bedtime.     potassium chloride SA (KLOR-CON M) 20 MEQ tablet While taking 40 mg of Furosemide, take Potassium Chloride 20 meq twice a day. When you decrease Furosemide to 20 mg, decrease Potassium Chloride to 20 meq once a day. 180 tablet 3   PRALUENT 150 MG/ML SOAJ Inject 150 mg into the skin every 14 (fourteen) days.     Probiotic Product (PROBIOTIC PO) Take 1 capsule by mouth daily.     pyridoxine (B-6) 100 MG tablet Take 100 mg by mouth daily.     rivaroxaban (XARELTO) 20 MG TABS tablet Take 1 tablet (20 mg total) by mouth daily with supper. 90 tablet 3   sodium chloride (OCEAN) 0.65 % SOLN nasal spray Place 1 spray into both nostrils daily as needed for congestion.     tiZANidine (ZANAFLEX) 4 MG tablet Take 1 tablet (4 mg total) by mouth 2 (two) times daily as needed for muscle spasms. 30 tablet 0   topiramate (TOPAMAX) 50 MG tablet TAKE 1 TABLET BY MOUTH EVERY MORNING AND 2 TABLETS EVERY EVENING 270 tablet 2   vitamin E 400 UNIT capsule Take 400 Units by mouth daily.     zinc gluconate 50 MG tablet Take 50 mg by mouth daily.     omeprazole (PRILOSEC) 20 MG capsule Take 20 mg by mouth 2 (two) times daily before a meal.  3   No current facility-administered medications for this encounter.    Atrial Fibrillation Management history:  Previous antiarrhythmic drugs: Multaq, amiodarone Previous cardioversions: None Previous ablations: 03/13/23 Anticoagulation history: Xarelto   ROS- All systems are reviewed and negative except as per the HPI above.  Physical Exam: BP (!) 148/88   Pulse 71   Ht 4\' 10"  (1.473 m)   Wt 73 kg   LMP 07/01/1980   BMI 33.65 kg/m   GEN: Well nourished, well developed in no acute distress NECK: No JVD; No carotid bruits CARDIAC: Regular rate and rhythm, no murmurs, rubs, gallops RESPIRATORY:  Clear to auscultation without rales, wheezing or rhonchi  ABDOMEN: Soft, non-tender,  non-distended EXTREMITIES:  No edema; No deformity    EKG today demonstrates  SR, 1st degree AV block Vent. rate 71 BPM PR interval 240 ms QRS duration 76 ms QT/QTcB 384/417 ms   Echo 01/20/23 demonstrated   1. Left ventricular ejection fraction, by estimation, is 60 to 65%. The  left ventricle has normal function. The left ventricle has no regional  wall motion abnormalities. There is mild asymmetric left ventricular  hypertrophy of the basal-septal segment.  Left ventricular diastolic parameters are indeterminate.   2. Right ventricular systolic function is normal. The right ventricular  size is normal.   3. Left  atrial size was mildly dilated.   4. The mitral valve is grossly normal. Mild mitral valve regurgitation.  No evidence of mitral stenosis.   5. The aortic valve is tricuspid. Aortic valve regurgitation is mild.   6. The inferior vena cava is normal in size with greater than 50%  respiratory variability, suggesting right atrial pressure of 3 mmHg.   Cardiac monitor 11/2022: 48% Afib/atrial flutter burden.   CHA2DS2-VASc Score = 4  The patient's score is based upon: CHF History: 1 HTN History: 1 Diabetes History: 0 Stroke History: 0 Vascular Disease History: 0 Age Score: 1 Gender Score: 1       ASSESSMENT AND PLAN: Paroxysmal Atrial Fibrillation (ICD10:  I48.0) The patient's CHA2DS2-VASc score is 4, indicating a 4.8% annual risk of stroke.   S/p afib and flutter ablation 03/13/23 Patient appears to be maintaining SR. Continue amiodarone 200 mg daily for now Continue Xarelto 20 mg daily with no missed doses for 3 months post ablation. Continue Lopressor 50 mg BID   Secondary Hypercoagulable State (ICD10:  D68.69) The patient is at significant risk for stroke/thromboembolism based upon her CHA2DS2-VASc Score of 4.  Continue Rivaroxaban (Xarelto).   Chronic HFpEF Weight trending down. Dry weight ~159 lbs.   OSA  Encouraged nightly CPAP  HTN Stable on  current regimen   Follow up with Dr Elberta Fortis as scheduled.    Jorja Loa PA-C [facility] [address] [place], [place] [address] [phone]

## 2023-04-11 DIAGNOSIS — M79642 Pain in left hand: Secondary | ICD-10-CM | POA: Diagnosis not present

## 2023-04-11 DIAGNOSIS — R768 Other specified abnormal immunological findings in serum: Secondary | ICD-10-CM | POA: Diagnosis not present

## 2023-04-11 DIAGNOSIS — M79641 Pain in right hand: Secondary | ICD-10-CM | POA: Diagnosis not present

## 2023-04-11 DIAGNOSIS — M3390 Dermatopolymyositis, unspecified, organ involvement unspecified: Secondary | ICD-10-CM | POA: Diagnosis not present

## 2023-04-11 DIAGNOSIS — M2559 Pain in other specified joint: Secondary | ICD-10-CM | POA: Diagnosis not present

## 2023-04-11 DIAGNOSIS — M1991 Primary osteoarthritis, unspecified site: Secondary | ICD-10-CM | POA: Diagnosis not present

## 2023-04-11 DIAGNOSIS — M797 Fibromyalgia: Secondary | ICD-10-CM | POA: Diagnosis not present

## 2023-04-11 DIAGNOSIS — Z6833 Body mass index (BMI) 33.0-33.9, adult: Secondary | ICD-10-CM | POA: Diagnosis not present

## 2023-04-11 DIAGNOSIS — E669 Obesity, unspecified: Secondary | ICD-10-CM | POA: Diagnosis not present

## 2023-04-11 DIAGNOSIS — M25511 Pain in right shoulder: Secondary | ICD-10-CM | POA: Diagnosis not present

## 2023-04-17 ENCOUNTER — Ambulatory Visit: Payer: Medicare Other | Admitting: General Practice

## 2023-04-26 DIAGNOSIS — Z23 Encounter for immunization: Secondary | ICD-10-CM | POA: Diagnosis not present

## 2023-05-04 ENCOUNTER — Other Ambulatory Visit: Payer: Self-pay | Admitting: Physician Assistant

## 2023-05-09 ENCOUNTER — Ambulatory Visit: Payer: Medicare Other | Admitting: Cardiovascular Disease

## 2023-05-09 ENCOUNTER — Ambulatory Visit: Payer: Medicare Other | Attending: Cardiovascular Disease | Admitting: Cardiovascular Disease

## 2023-05-09 ENCOUNTER — Encounter: Payer: Self-pay | Admitting: Cardiovascular Disease

## 2023-05-09 VITALS — BP 112/68 | HR 74 | Ht <= 58 in | Wt 159.0 lb

## 2023-05-09 DIAGNOSIS — D6869 Other thrombophilia: Secondary | ICD-10-CM

## 2023-05-09 DIAGNOSIS — I5032 Chronic diastolic (congestive) heart failure: Secondary | ICD-10-CM | POA: Diagnosis not present

## 2023-05-09 DIAGNOSIS — I480 Paroxysmal atrial fibrillation: Secondary | ICD-10-CM

## 2023-05-09 DIAGNOSIS — G4733 Obstructive sleep apnea (adult) (pediatric): Secondary | ICD-10-CM

## 2023-05-09 DIAGNOSIS — I10 Essential (primary) hypertension: Secondary | ICD-10-CM | POA: Diagnosis not present

## 2023-05-09 DIAGNOSIS — E669 Obesity, unspecified: Secondary | ICD-10-CM

## 2023-05-09 MED ORDER — IPRATROPIUM BROMIDE 0.03 % NA SOLN: 2.0000 | Freq: Two times a day (BID) | NASAL | 4 refills | Status: DC

## 2023-05-09 NOTE — Patient Instructions (Signed)
Medication Instructions:   Ipratropium (ATROVENT) 0.03 % nasal spray  2 sprays into both nostrils every 12 hours *If you need a refill on your cardiac medications before your next appointment, please call your pharmacy*  Follow-Up: At [facility], you and your health needs are our priority.  As part of our continuing mission to provide you with exceptional heart care, we have created designated Provider Care Teams.  These Care Teams include your primary Cardiologist (physician) and Advanced Practice Providers (APPs -  Physician Assistants and Nurse Practitioners) who all work together to provide you with the care you need, when you need it.  We recommend signing up for the patient portal called "MyChart".  Sign up information is provided on this After Visit Summary.  MyChart is used to connect with patients for Virtual Visits (Telemedicine).  Patients are able to view lab/test results, encounter notes, upcoming appointments, etc.  Non-urgent messages can be sent to your provider as well.   To learn more about what you can do with MyChart, go to [email or web address].    Your next appointment:     5 months (April 2025)  Provider:   Thurmon Fair, MD

## 2023-05-11 NOTE — Progress Notes (Signed)
Cardiology Office Note:  .   Date:  05/11/2023  ID:  TAKAI CAUL, DOB [birth year], MRN [id number] PCP: Rodrigo Ran, MD  [facility] Providers Cardiologist:  Thurmon Fair, MD Electrophysiologist:  Will Jorja Loa, MD    History of Present Illness: Marland Kitchen   TYNIYA MAIZE is a 72 y.o. female with paroxysmal and persistent atrial fibrillation with rapid ventricular response complicated by acute exacerbation of diastolic heart failure on a background history of hypertension, hypercholesterolemia, mild obesity, recently diagnosed obstructive sleep apnea.  She has noticed a marked improvement in her quality of life after undergoing ablation for atrial fibrillation.  She thinks treatment with CPAP has also had a positive impact.  She has a lot more energy.  She has not had issues of lower extremity edema or exertional dyspnea.  She does not have orthopnea or PND.  She is not troubled by palpitations or chest pain.  Typically at home on her scale she weighs around 155 pounds, today in our office scale comparable value of 159 pounds.  She has some upper airway complaints.  Her voice is raspy and she has a lot of secretions that she has to clear every morning.  She has constantly runny nose despite the fact that she takes a nasal steroid, antihistamine, leukotriene inhibitor.  She remains on antiarrhythmic therapy with amiodarone 200 mg daily and on anticoagulation with Xarelto 20 mg daily.  She is also still taking a moderate dose of Toprol tartrate 50 mg twice daily as well as a very low-dose of furosemide 20 mg daily and a potassium supplement.  She is on lipid therapy with ezetimibe having been intolerant of statins (elevated liver function test with simvastatin, skin rash and myalgia with rosuvastatin).  Recently performed echocardiogram and a Lexiscan Myoview showed normal left ventricular systolic function, diastolic function assessment was limited due to the arrhythmia but the mitral  annulus velocities were not bad (medial E prime 8.3, lateral E prime 9.76), mildly dilated left atrium (end-systolic diameter 4.3 cm, biplane end-systolic volume index 34 mL/m) and no evidence  of ischemia on her nuclear images.  The home sleep study was abnormal (mild OSA with AHI 11.5/hour, but with oxygen desaturation as low as 70%), but she has yet to be scheduled for the CPAP titration testing.  ROS: Has not had palpitations or tachycardia since her ablation procedure.  Chest pain has never been a major complaint.  Studies Reviewed: .        Echocardiogram 01/20/2023  1. Left ventricular ejection fraction, by estimation, is 60 to 65%. The  left ventricle has normal function. The left ventricle has no regional  wall motion abnormalities. There is mild asymmetric left ventricular  hypertrophy of the basal-septal segment.  Left ventricular diastolic parameters are indeterminate.   2. Right ventricular systolic function is normal. The right ventricular  size is normal.   3. Left atrial size was mildly dilated.   4. The mitral valve is grossly normal. Mild mitral valve regurgitation.  No evidence of mitral stenosis.   5. The aortic valve is tricuspid. Aortic valve regurgitation is mild.   6. The inferior vena cava is normal in size with greater than 50%  respiratory variability, suggesting right atrial pressure of 3 mmHg.   Comparison(s): No prior Echocardiogram.   Lexiscan Myoview 01/20/2023:   No ischemia. There is a small mild fixed mid-anterior defect that is most consistent with breast attenuation given normal wall motion. The study is overall low risk due  to grossly normal perfusion, normal LV function and no TID. Patient was symptomatic after stress testing, see separate documentation from Dr. Royann Shivers. If clinical concern persists, consider cardiac PET-CT with myocardial blood flow or coronary CTA.   No ST deviation was noted.   LV perfusion is abnormal. Defect 1: There is a  small defect with mild reduction in uptake present in the mid anterior location(s) that is fixed. There is normal wall motion in the defect area. Consistent with artifact caused by breast attenuation.   Left ventricular function is normal. Nuclear stress EF: 69%. The left ventricular ejection fraction is hyperdynamic (>65%). End diastolic cavity size is normal. End systolic cavity size is normal.   Prior study not available for comparison.  Risk Assessment/Calculations:    CHA2DS2-VASc Score = 4   This indicates a 4.8% annual risk of stroke. The patient's score is based upon: CHF History: 1 HTN History: 1 Diabetes History: 0 Stroke History: 0 Vascular Disease History: 0 Age Score: 1 Gender Score: 1         Physical Exam:   VS:  BP 112/68 (BP Location: Left Arm, Patient Position: Sitting, Cuff Size: Normal)   Pulse 74   Ht 4\' 10"  (1.473 m)   Wt 159 lb (72.1 kg)   LMP 07/01/1980   SpO2 95%   BMI 33.23 kg/m    Wt Readings from Last 3 Encounters:  05/09/23 159 lb (72.1 kg)  04/10/23 161 lb (73 kg)  04/03/23 164 lb 3.2 oz (74.5 kg)    GEN: Well nourished, well developed in no acute distress, mildly obese.  She looks very comfortable. NECK: No evidence of JVD; No carotid bruits CARDIAC: RRR, no murmurs, rubs, gallops RESPIRATORY: A few scattered rales in both bases ABDOMEN: Soft, non-tender, non-distended EXTREMITIES:  No edema; No deformity   ASSESSMENT AND PLAN: .   CHF: Appears clinically euvolemic and her weight is close to what we have previously estimated to be her "dry weight".  Her episodes of decompensation have generally occurred in relationship to atrial fibrillation or following cardioversions.  She has proven to be relatively fragile when treated with diuretics, developing symptomatic hypotension with overdiuresis.  AFib: s/p recent ablation with excellent clinical response.  On anticoagulation.  Has an appointment in the A-fib clinic 04/10/2023.  Appointment  scheduled with Dr. Elberta Fortis 07/15/2023 when he may recommend discontinuation of antiarrhythmic therapy with amiodarone.  TSH and liver function tests need to be rechecked every 6 months if she remains on amiodarone. Anticoagulation: So far without serious bleeding complications on anticoagulant,.  In the long-term she is probably well served by a Watchman device, which would allow her to take NSAIDs for arthralgias (she cannot take acetaminophen due to allergy, she prefers to avoid opiates).    She does have a history of remote GI bleeding when she took aspirin for joint complaints.  Trying to avoid NSAIDs, but unfortunately she cannot take acetaminophen either due to an allergic response.   In the long run, may be well served by a Watchman procedure to avoid the need for anticoagulation.  She can review this with Dr. Elberta Fortis at the upcoming appointment. HTN: Very well-controlled. OSA:   Has started treatment CPAP and thinks that this is contributing to her improved quality of life. Obesity: Recently started treatment with tirzepatide.  Hoping to lose some weight.4 Rhinorrhea: Despite nasal steroid, Singulair, antihistamine.  Will try some nasal ipratropium spray.  She is planning to follow-up with her ENT specialist in [place] as  well.    Patient Instructions  Medication Instructions:   Ipratropium (ATROVENT) 0.03 % nasal spray  2 sprays into both nostrils every 12 hours *If you need a refill on your cardiac medications before your next appointment, please call your pharmacy*  Follow-Up: At [facility], you and your health needs are our priority.  As part of our continuing mission to provide you with exceptional heart care, we have created designated Provider Care Teams.  These Care Teams include your primary Cardiologist (physician) and Advanced Practice Providers (APPs -  Physician Assistants and Nurse Practitioners) who all work together to provide you with the care you need, when  you need it.  We recommend signing up for the patient portal called "MyChart".  Sign up information is provided on this After Visit Summary.  MyChart is used to connect with patients for Virtual Visits (Telemedicine).  Patients are able to view lab/test results, encounter notes, upcoming appointments, etc.  Non-urgent messages can be sent to your provider as well.   To learn more about what you can do with MyChart, go to [email or web address].    Your next appointment:     5 months (April 2025)  Provider:   Thurmon Fair, MD      Signed, Thurmon Fair, MD

## 2023-05-19 ENCOUNTER — Telehealth: Payer: Self-pay | Admitting: Physical Medicine and Rehabilitation

## 2023-05-19 NOTE — Telephone Encounter (Signed)
Patient called needing to schedule an appointment with Dr. Alvester Morin for an injection in her groin area. Per patient please leave a message if can not answer the phone.  The number to contact patient is [phone]

## 2023-05-20 ENCOUNTER — Ambulatory Visit: Payer: Medicare Other | Admitting: General Practice

## 2023-05-26 IMAGING — CT CT BIOPSY
1 of 2 series · 14 of 32 positions shown, 19 images · non-contrast
Comparison: none

CLINICAL DATA: 71-year-old female with right sacroiliac joint
dysfunction. She had good relief following injection of anesthetic
only on 08/28/2021 and presents today for injection of both
anesthetic and steroid.

[Series 2: needle -guided injection · axial · 0.79mm/px · z∈[-46,+30]mm · 14 of 43 slices shown, 19 images]
[im 3/43  soft-tissue]
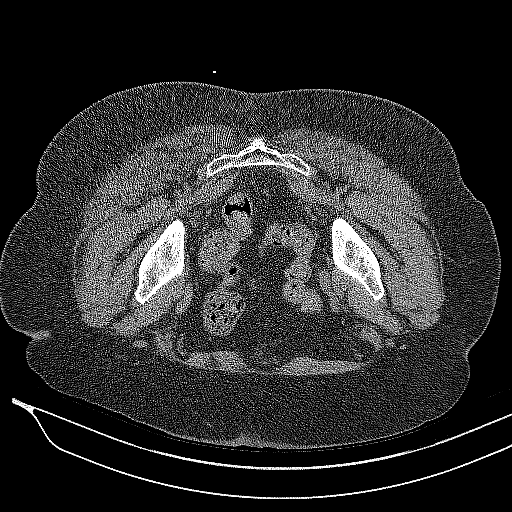
[im 3/43  bone]
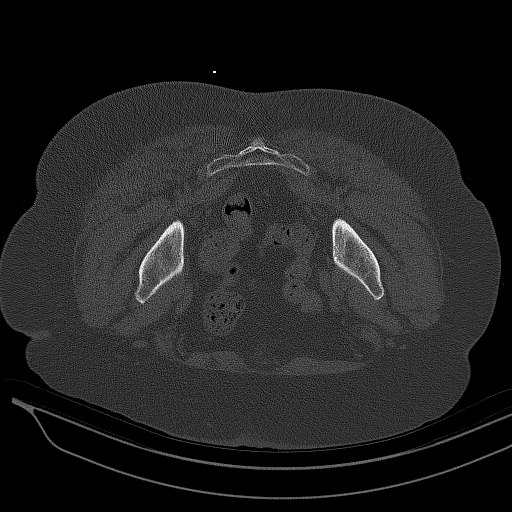
[im 7/43  soft-tissue]
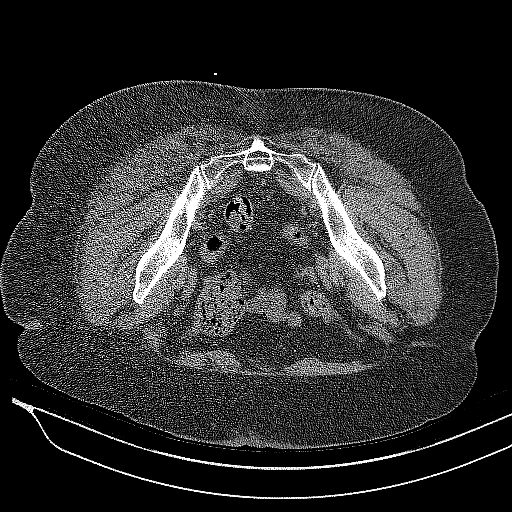
[im 9/43  soft-tissue]
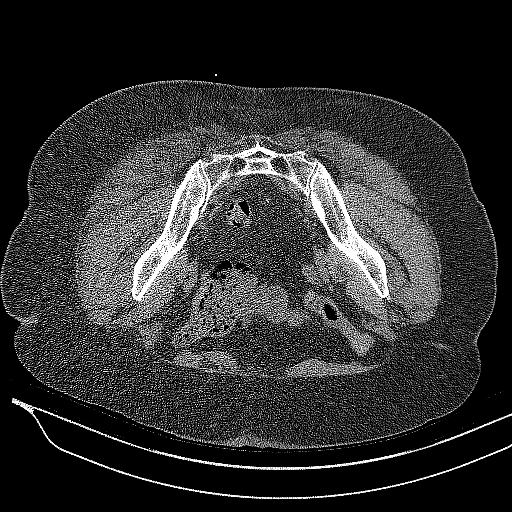
[im 13/43  soft-tissue]
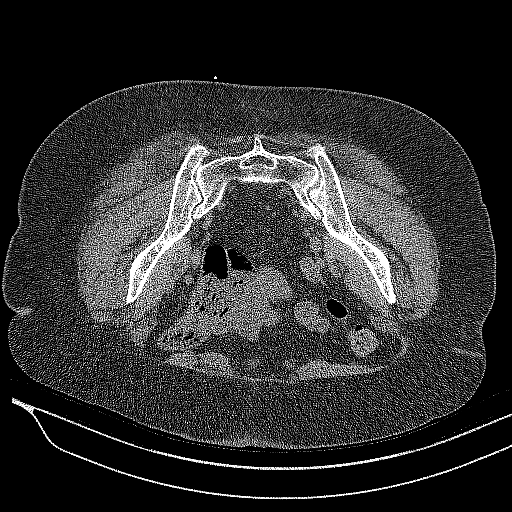
[im 15/43  soft-tissue]
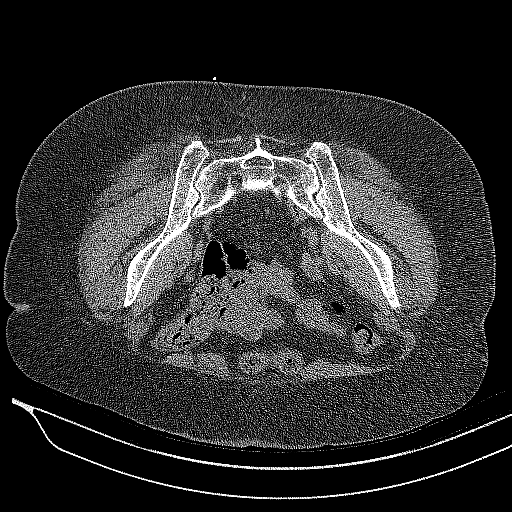
[im 19/43  soft-tissue]
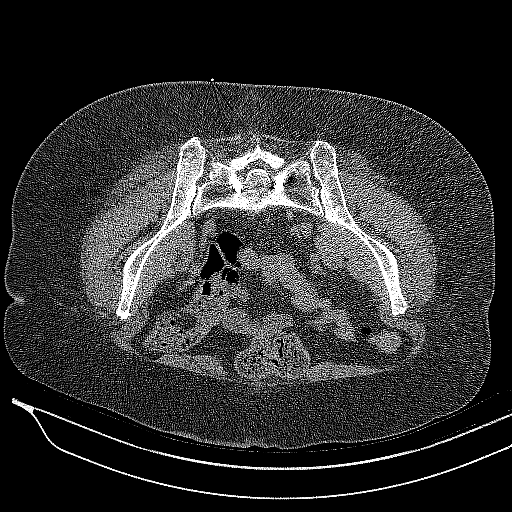
[im 21/43  soft-tissue]
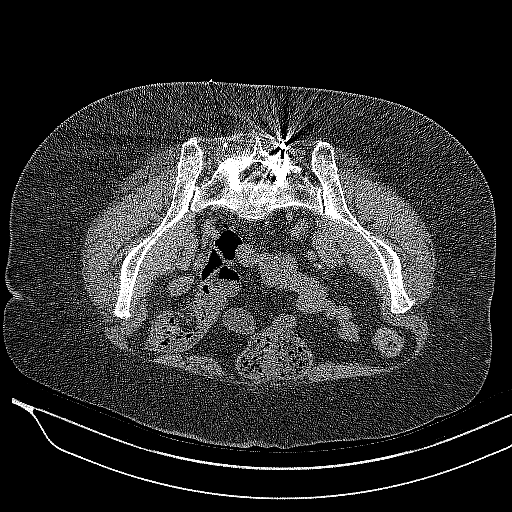
[im 23/43  soft-tissue]
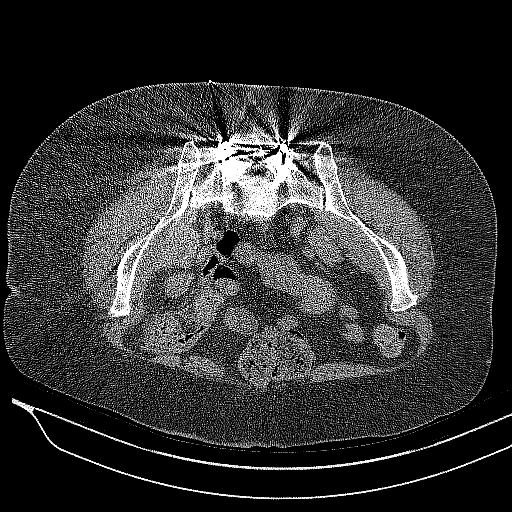
[im 27/43  soft-tissue]
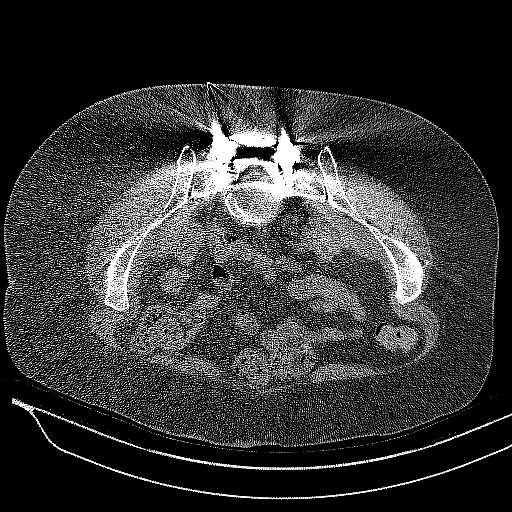
[im 27/43  bone]
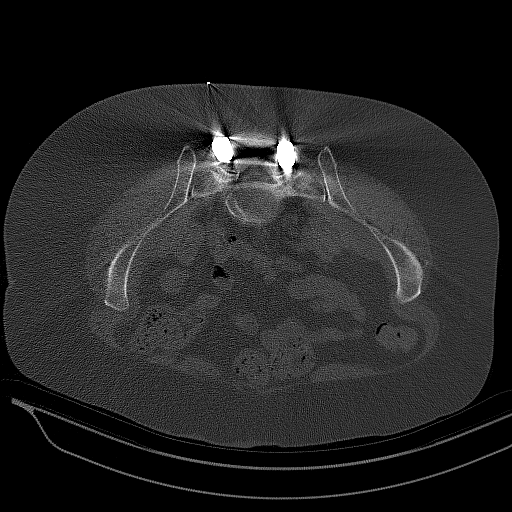
[im 29/43  soft-tissue]
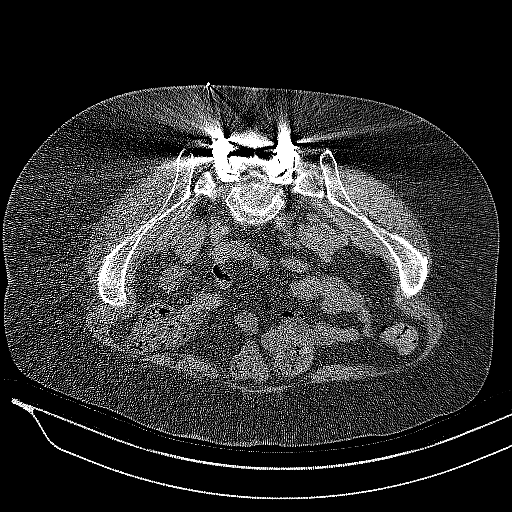
[im 33/43  soft-tissue]
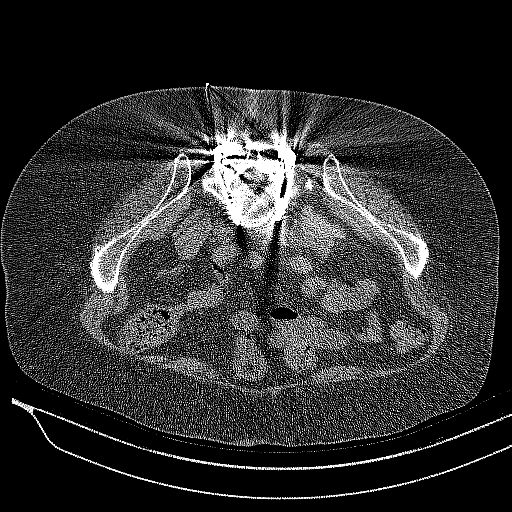
[im 33/43  lung]
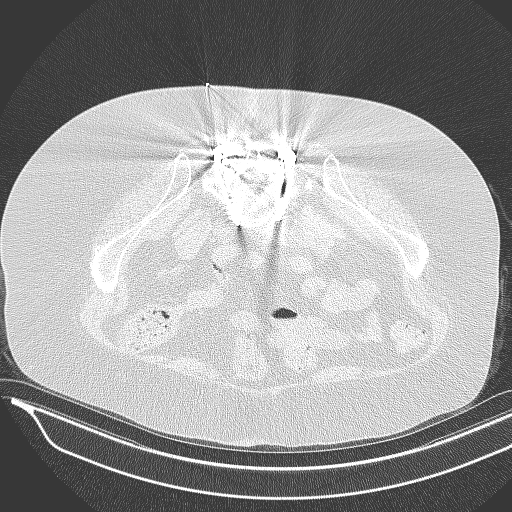
[im 37/43  soft-tissue]
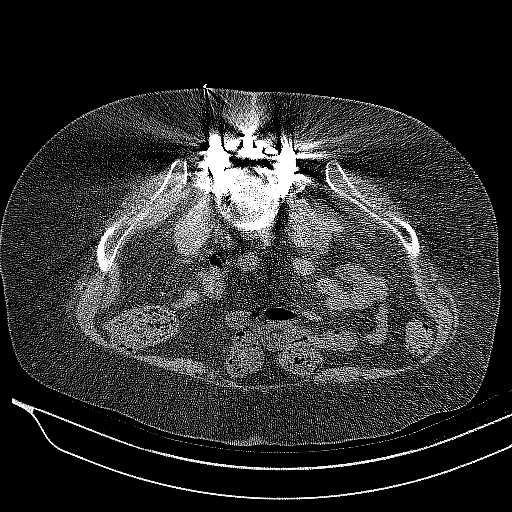
[im 37/43  lung]
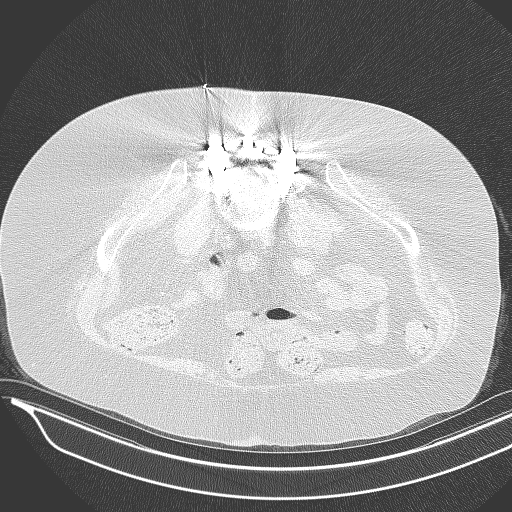
[im 39/43  lung]
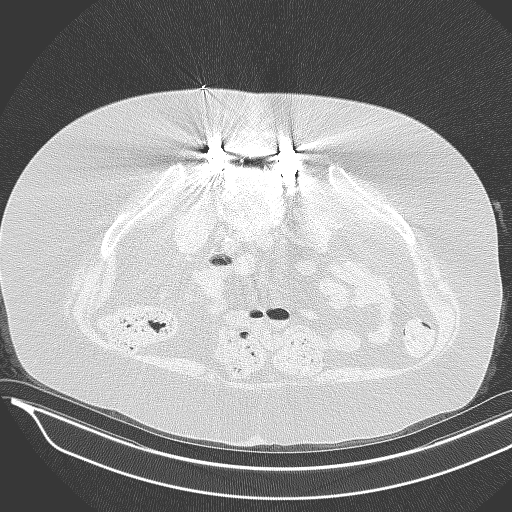
[im 41/43  soft-tissue]
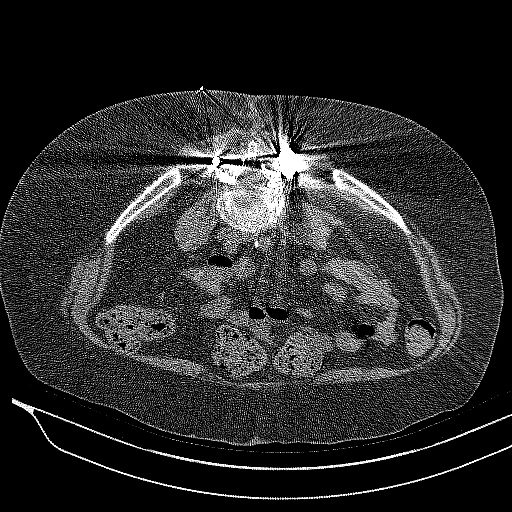
[im 41/43  lung]
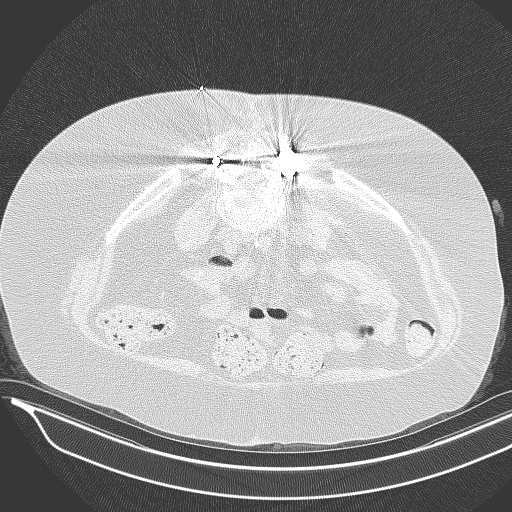

[14 of 32 positions shown; findings below may reference images not displayed]

EXAM:
Right CT GUIDED SI JOINT INJECTION



After local anesthesia with 1% lidocaine without epinephrine and
subsequent deep anesthesia, a 3-1/2 inch curved 22 gauge spinal
needle was advanced into the right SI joint under intermittent CT
guidance.

Once the needle was in satisfactory position, representative image
was captured with the needle demonstrated in the sacroiliac joint.
Subsequently, 80 mg Depo-Medrol and 1 mL 0.5% bupivacaine was
injected into the right SI joint. Needles removed and a sterile
dressing applied.

No complications were observed.
IMPRESSION: Successful CT-guided right SI joint injection of anesthetic and
steroid.

RADIATION DOSE REDUCTION: This exam was performed according to the
departmental dose-optimization program which includes automated
exposure control, adjustment of the mA and/or kV according to
patient size and/or use of iterative reconstruction technique.

## 2023-05-27 DIAGNOSIS — D649 Anemia, unspecified: Secondary | ICD-10-CM | POA: Diagnosis not present

## 2023-05-27 DIAGNOSIS — I700 Atherosclerosis of aorta: Secondary | ICD-10-CM | POA: Diagnosis not present

## 2023-05-27 DIAGNOSIS — R7301 Impaired fasting glucose: Secondary | ICD-10-CM | POA: Diagnosis not present

## 2023-05-27 DIAGNOSIS — R0600 Dyspnea, unspecified: Secondary | ICD-10-CM | POA: Diagnosis not present

## 2023-05-27 DIAGNOSIS — J309 Allergic rhinitis, unspecified: Secondary | ICD-10-CM | POA: Diagnosis not present

## 2023-05-27 DIAGNOSIS — K219 Gastro-esophageal reflux disease without esophagitis: Secondary | ICD-10-CM | POA: Diagnosis not present

## 2023-05-27 DIAGNOSIS — E785 Hyperlipidemia, unspecified: Secondary | ICD-10-CM | POA: Diagnosis not present

## 2023-05-27 DIAGNOSIS — I10 Essential (primary) hypertension: Secondary | ICD-10-CM | POA: Diagnosis not present

## 2023-05-27 DIAGNOSIS — I4892 Unspecified atrial flutter: Secondary | ICD-10-CM | POA: Diagnosis not present

## 2023-05-27 DIAGNOSIS — I509 Heart failure, unspecified: Secondary | ICD-10-CM | POA: Diagnosis not present

## 2023-05-27 DIAGNOSIS — M810 Age-related osteoporosis without current pathological fracture: Secondary | ICD-10-CM | POA: Diagnosis not present

## 2023-05-27 DIAGNOSIS — M3390 Dermatopolymyositis, unspecified, organ involvement unspecified: Secondary | ICD-10-CM | POA: Diagnosis not present

## 2023-06-02 ENCOUNTER — Telehealth: Payer: Self-pay | Admitting: Internal Medicine

## 2023-06-02 NOTE — Telephone Encounter (Signed)
Inbound call from patient stating she is still having loose bowel. States medication that was prescribed has not been helping. States even when she takes to it seems to not help. Patient is requesting a call to discuss alternative medications. Please advise, thank you.

## 2023-06-02 NOTE — Telephone Encounter (Signed)
Spoke with the pt and she tells me that she continues to have diarrhea despite lomotil.  She tells me that she takes 1 or 2 lomotil when she has diarrhea but not on a regular basis every 4 hours.  She has been set up for an appt with Dr Rhea Belton to discuss.  She will increase her fiber and take the lomotil exactly as prescribed and call back to check for any cancellations.

## 2023-06-04 NOTE — Telephone Encounter (Signed)
Pt states she has been taking lomotil and it has helped her with loose stools and incontinence. Reports for the last 5 mths she has started having problems again. She goes 3-5 times/day and it is diarrhea. Reports she never knows when she is going to have to go and if she will make it to the bathroom in time. Offered her an OV with an APP but she wants to Dr. Rhea Belton. Pt wanted a message to be sent to Dr. Rhea Belton to see what else he might recommend for her until she can be seen. States she even took 2 when going out and still had issues. Please advise.

## 2023-06-04 NOTE — Telephone Encounter (Signed)
Inbound call from patient, states for about five months she has been taking Lomotil but has seen no improvement. She realizes she does have an appointment in January and is wishing to speak to someone for temprorary relief. She states it is now affecting her social life. Please advise.

## 2023-06-04 NOTE — Telephone Encounter (Signed)
Left message for pt to call back  °

## 2023-06-05 NOTE — Telephone Encounter (Signed)
I would recommend we have her perform a fecal lactoferrin, stool culture, ova and parasite exam and C. difficile PCR After stool studies are submitted she can try colestipol 2 g nightly Lomotil could still be used 1 tablet 3-4 times a day as needed I will let her know when we get the stool study results and she should let me know how the colestipol is working Is still having symptoms we will bring her in for a visit

## 2023-06-06 ENCOUNTER — Other Ambulatory Visit: Payer: Self-pay

## 2023-06-06 DIAGNOSIS — K529 Noninfective gastroenteritis and colitis, unspecified: Secondary | ICD-10-CM

## 2023-06-06 MED ORDER — COLESTIPOL HCL 1 G PO TABS: 2.0000 g | ORAL_TABLET | Freq: Every day | ORAL | 3 refills | Status: DC

## 2023-06-06 NOTE — Telephone Encounter (Signed)
Spoke with pt and she is aware of Dr. Rhea Belton recommendations. She knows to come for labs and about the prescription. She knows to submit stool specimen prior to starting med.

## 2023-06-06 NOTE — Telephone Encounter (Signed)
Left message for pt to call back. Lab orders in epic, script sent to pharmacy.

## 2023-06-09 ENCOUNTER — Other Ambulatory Visit: Payer: Self-pay

## 2023-06-10 ENCOUNTER — Other Ambulatory Visit: Payer: Medicare Other

## 2023-06-10 DIAGNOSIS — K529 Noninfective gastroenteritis and colitis, unspecified: Secondary | ICD-10-CM | POA: Diagnosis not present

## 2023-06-11 LAB — CLOSTRIDIUM DIFFICILE BY PCR: Toxigenic C. Difficile by PCR: NEGATIVE

## 2023-06-12 ENCOUNTER — Encounter: Payer: Self-pay | Admitting: Physician Assistant

## 2023-06-12 ENCOUNTER — Other Ambulatory Visit (INDEPENDENT_AMBULATORY_CARE_PROVIDER_SITE_OTHER): Payer: Medicare Other

## 2023-06-12 ENCOUNTER — Ambulatory Visit: Payer: Medicare Other | Admitting: Physician Assistant

## 2023-06-12 DIAGNOSIS — M25552 Pain in left hip: Secondary | ICD-10-CM | POA: Diagnosis not present

## 2023-06-12 DIAGNOSIS — Z96641 Presence of right artificial hip joint: Secondary | ICD-10-CM

## 2023-06-12 DIAGNOSIS — M25551 Pain in right hip: Secondary | ICD-10-CM | POA: Diagnosis not present

## 2023-06-12 LAB — FECAL LACTOFERRIN, QUANT
Fecal Lactoferrin: POSITIVE — AB
MICRO NUMBER:: 15831893
SPECIMEN QUALITY:: ADEQUATE

## 2023-06-12 LAB — OVA AND PARASITE EXAMINATION
CONCENTRATE RESULT:: NONE SEEN
MICRO NUMBER:: 15831728
SPECIMEN QUALITY:: ADEQUATE
TRICHROME RESULT:: NONE SEEN

## 2023-06-12 MED ORDER — LIDOCAINE HCL 1 % IJ SOLN
3.0000 mL | INTRAMUSCULAR | Status: AC | PRN
  Administered 2023-06-12: 3 mL

## 2023-06-12 MED ORDER — METHYLPREDNISOLONE ACETATE 40 MG/ML IJ SUSP
40.0000 mg | INTRAMUSCULAR | Status: AC | PRN
  Administered 2023-06-12: 40 mg via INTRA_ARTICULAR

## 2023-06-12 NOTE — Progress Notes (Signed)
HPI: Amber Bennett 72 year old female history of right total hip arthroplasty 02/12/2022.  Comes in today due to groin pain that begins after walking for some 2020 minutes.  She has to sit down.  States it grabs her it is deep achy pain she points to the anterior aspect of the hip but also the lateral aspect of the buttocks.  She has difficulty with steps.  She denies any radicular symptoms down the leg.  She has had no new injury.  She did see her neurosurgeon Dr. Yetta Barre who set her up for an epidural steroid injection did not help with her pain.  She returns today for further evaluation of her right hip.  Review of systems: Negative for fevers chills.  Physical exam: General Well-developed well-nourished female who ambulates without any assistive device and a nonantalgic gait.  She is able to get on and off the exam table on her own. Respirations: Unlabored Bilateral hips good range of motion without significant pain.  Hip flexion bilaterally causes no discomfort.  She has tenderness maximally over the right hip trochanteric region.  Slight tenderness over the left hip trochanteric region.  Radiographs: AP pelvis lateral view right hip: Bilateral hips well located.  Right total hip arthroplasty components well-seated.  Hardware lower lumbar visible on the AP pelvis with no gross abnormalities or hardware failure.  No acute fractures.  Impression: Right hip pain Differential trochanteric bursitis  Plan: She shown IT band stretching.  Offered her cortisone injection for both therapeutic and diagnostic purposes.  She is agreeable.  Will see her back in approximately 2 weeks to see what type her results she had to the injection.  Questions were encouraged and answered at length.    Procedure Note  Patient: Amber Bennett             Date of Birth: [birth year]           MRN: [id number]             Visit Date: 06/12/2023  Procedures: Visit Diagnoses:  1. History of right hip replacement   2. Pain in  left hip     Large Joint Inj on 06/12/2023 4:40 PM Indications: pain Details: 22 G 1.5 in needle, lateral approach  Arthrogram: No  Medications: 3 mL lidocaine 1 %; 40 mg methylPREDNISolone acetate 40 MG/ML Outcome: tolerated well, no immediate complications Procedure, treatment alternatives, risks and benefits explained, specific risks discussed. Consent was given by the patient. Immediately prior to procedure a time out was called to verify the correct patient, procedure, equipment, support staff and site/side marked as required. Patient was prepped and draped in the usual sterile fashion.

## 2023-06-14 LAB — STOOL CULTURE: E coli, Shiga toxin Assay: NEGATIVE

## 2023-06-16 NOTE — Telephone Encounter (Signed)
Patient called requesting to speak with you in regards to lab results.

## 2023-06-18 NOTE — Telephone Encounter (Signed)
Pt calling about additional stool test results, please advise.

## 2023-06-18 NOTE — Telephone Encounter (Signed)
Please let Amber Bennett know --There is positive white cells in her stool indicating inflammation --The infectious panels were all negative --Previously magnesium had caused her loose stools and diarrhea so make sure she has remained off of magnesium --I would recommend we treat with rifaximin 550 mg 3 times daily x 14 days; may need to get samples for this therapy --She has an appointment with me in January but she should let me know her response to rifaximin even before that if diarrhea fails to improve

## 2023-06-20 NOTE — Telephone Encounter (Signed)
Spoke with pt and she is aware of results and recommendations. She knows we are trying to get her samples and we will call her when/if we can get them.

## 2023-07-01 ENCOUNTER — Other Ambulatory Visit: Payer: Self-pay | Admitting: Neurology

## 2023-07-01 NOTE — Telephone Encounter (Signed)
 Requested Prescriptions   Pending Prescriptions Disp Refills   PHENobarbital  (LUMINAL) 97.2 MG tablet [Pharmacy Med Name: PHENOBARBITAL  97.2MG  TABLETS] 135 tablet     Sig: TAKE 1 AND 1/2 TABLETS BY MOUTH EVERY DAY   Last seen 12/11/22, next appt 12/11/23 Dispenses   Dispensed Days Supply Quantity Provider Pharmacy  PHENOBARBITAL  97.2MG  TABLETS 04/08/2023 90 135 each Onita Duos, MD [facility] #...  PHENOBARBITAL  97.2MG  TABLETS 01/09/2023 90 135 each Onita Duos, MD [facility] #...  PHENOBARBITAL  97.2MG  TABLETS 10/17/2022 90 127 each Loreli Elsie JONETTA Mickey., MD [facility] #...  PHENOBARBITAL  97.2MG  TABLETS 10/14/2022 5 8 each Loreli Elsie JONETTA Mickey., MD [facility] #...  PHENOBARBITAL  97.2MG  TABLETS 07/16/2022 84 127 each Sherryl Bouchard, NP [facility] #...  PHENOBARBITAL  97.2MG  TABLETS 07/15/2022 5 8 each Sherryl Bouchard, NP [facility] #...      How do dispenses affect the score?

## 2023-07-04 NOTE — Telephone Encounter (Signed)
 Left detailed message for pt that her samples are here and she can pick them up at the 3rd floor receoption desk. Written instructions provided for pt that she is to take the xifaxan 1 by mouth tid. She knows she can pick them up M-F between the hours of 7:30am-5pm.

## 2023-07-08 DIAGNOSIS — M25512 Pain in left shoulder: Secondary | ICD-10-CM | POA: Diagnosis not present

## 2023-07-08 DIAGNOSIS — M898X1 Other specified disorders of bone, shoulder: Secondary | ICD-10-CM | POA: Diagnosis not present

## 2023-07-08 DIAGNOSIS — W19XXXA Unspecified fall, initial encounter: Secondary | ICD-10-CM | POA: Diagnosis not present

## 2023-07-09 DIAGNOSIS — M25512 Pain in left shoulder: Secondary | ICD-10-CM | POA: Diagnosis not present

## 2023-07-15 ENCOUNTER — Ambulatory Visit: Payer: Medicare Other | Admitting: Cardiology

## 2023-07-15 ENCOUNTER — Telehealth: Payer: Self-pay | Admitting: Adult Health

## 2023-07-15 NOTE — Telephone Encounter (Signed)
 Pt request refill for PHENobarbital (LUMINAL) 97.2 MG tablet  and gabapentin (NEURONTIN) 800 MG tablet sent to [facility] #[id number]

## 2023-07-17 ENCOUNTER — Ambulatory Visit: Payer: Medicare Other | Admitting: Internal Medicine

## 2023-07-17 ENCOUNTER — Encounter: Payer: Self-pay | Admitting: Internal Medicine

## 2023-07-17 VITALS — BP 114/68 | HR 68 | Ht <= 58 in | Wt 161.1 lb

## 2023-07-17 DIAGNOSIS — Z860100 Personal history of colon polyps, unspecified: Secondary | ICD-10-CM

## 2023-07-17 DIAGNOSIS — R197 Diarrhea, unspecified: Secondary | ICD-10-CM

## 2023-07-17 DIAGNOSIS — Z860101 Personal history of adenomatous and serrated colon polyps: Secondary | ICD-10-CM | POA: Diagnosis not present

## 2023-07-17 DIAGNOSIS — K219 Gastro-esophageal reflux disease without esophagitis: Secondary | ICD-10-CM

## 2023-07-17 NOTE — Patient Instructions (Addendum)
Continue colestipol 2 grams at bedtime.   Continue Lomotil as needed.   _______________________________________________________  If your blood pressure at your visit was 140/90 or greater, please contact your primary care physician to follow up on this.  _______________________________________________________  If you are age 75 or older, your body mass index should be between 23-30. Your Body mass index is 33.68 kg/m. If this is out of the aforementioned range listed, please consider follow up with your Primary Care Provider.  If you are age 35 or younger, your body mass index should be between 19-25. Your Body mass index is 33.68 kg/m. If this is out of the aformentioned range listed, please consider follow up with your Primary Care Provider.   ________________________________________________________  The [facility] providers would like to encourage you to use [facility] to communicate with providers for non-urgent requests or questions.  Due to long hold times on the telephone, sending your provider a message by [facility] may be a faster and more efficient way to get a response.  Please allow 48 business hours for a response.  Please remember that this is for non-urgent requests.  _______________________________________________________

## 2023-07-17 NOTE — Progress Notes (Signed)
   Subjective:    Patient ID: Amber Bennett, female    DOB: 09-20-1950, 72 y.o.   MRN: [id number]  HPI Amber Bennett is a 73 year old female with a history of GERD, hepatic steatosis, adenomatous colon polyps, previous diarrhea felt to be at mostly irritable in nature exacerbated by magnesium supplementation, previous symptomatic internal hemorrhoids who is here for follow-up.  She is here today with her husband.  She had called the office in late November or early December with significant diarrhea with foul-smelling stools.  Stool studies showed a positive fecal WBC, neg c diff and GI pathogen panel.  A recent fall resulting in a broken collarbone. The fall occurred on the second of the month, and the patient self-managed initially with over-the-counter Aleve and a sling purchased from [employer]. However, upon consultation with her son, she discontinued the Aleve due to potential interactions with her heart medication. The patient sought medical attention on the eighth of the month, where the broken collarbone was confirmed. The patient has been managing the pain without pain medication or muscle relaxers, which she found made her feel "loopy."  Regarding her chronic diarrhea, the patient completed a two-week course of rifaximin, which improved the frequency and consistency of her stools. She also reported a reduction in the odor of her stools. The patient is currently taking 2 g colestipol at bedtime and diphenoxylate/atropine as needed, particularly when going out or having guests over. She reported satisfaction with her current bowel management.  The patient also mentioned a history of reflux, which is well-managed with twice-daily omeprazole. She has discontinued her magnesium supplement, which had previously contributed to loose stools.   Review of Systems As per HPI, otherwise negative  Current Medications, Allergies, Past Medical History, Past Surgical History, Family History and Social  History were reviewed in [employer] record.    Objective:   Physical Exam BP 114/68   Pulse 68   Ht 4\' 10"  (1.473 m)   Wt 161 lb 2 oz (73.1 kg)   LMP 07/01/1980   SpO2 96%   BMI 33.68 kg/m  Gen: awake, alert, NAD HEENT: anicteric  Abd: soft, NT/ND, +BS throughout Ext: no c/c/e, left arm in sling Neuro: nonfocal       Assessment & Plan:  73 year old female with a history of GERD, hepatic steatosis, adenomatous colon polyps, previous diarrhea felt to be at mostly irritable in nature exacerbated by magnesium supplementation, previous symptomatic internal hemorrhoids who is here for follow-up.  Recent acute diarrhea presumed infectious/cannot exclude SIBO --positive response to rifaximin x 14 days.  Microscopic colitis was excluded in 2021. -- Continue colestipol 2 g at bedtime -- Can use Lomotil 1 tablet 3 times daily as needed but if this is needed on a regular basis I would suggest we titrate colestipol -- Contact me if symptoms recur  2.  Small adenoma of the colon in 2021 -- Consider surveillance colonoscopy March 2028  3. GERD --stable on omeprazole 20 mg twice daily  4. Fractured Clavicle Recent fall resulting in a broken collarbone. Pain managed with Aleve initially, but discontinued due to potential interaction with heart medication. Currently managing pain without medication. -Continue to monitor and manage pain as tolerated. -Follow-up appointment with orthopedics scheduled for mid-February to assess need for surgery.  30 minutes total spent today including patient facing time, coordination of care, reviewing medical history/procedures/pertinent radiology studies, and documentation of the encounter.

## 2023-07-22 ENCOUNTER — Encounter: Payer: Self-pay | Admitting: Allergy and Immunology

## 2023-07-22 ENCOUNTER — Ambulatory Visit (INDEPENDENT_AMBULATORY_CARE_PROVIDER_SITE_OTHER): Payer: Medicare Other | Admitting: Allergy and Immunology

## 2023-07-22 ENCOUNTER — Other Ambulatory Visit: Payer: Self-pay

## 2023-07-22 VITALS — BP 128/78 | HR 71 | Temp 98.0°F | Resp 20 | Ht <= 58 in | Wt 161.9 lb

## 2023-07-22 DIAGNOSIS — J4550 Severe persistent asthma, uncomplicated: Secondary | ICD-10-CM | POA: Diagnosis not present

## 2023-07-22 DIAGNOSIS — R0609 Other forms of dyspnea: Secondary | ICD-10-CM | POA: Diagnosis not present

## 2023-07-22 DIAGNOSIS — M3313 Other dermatomyositis without myopathy: Secondary | ICD-10-CM | POA: Diagnosis not present

## 2023-07-22 DIAGNOSIS — J302 Other seasonal allergic rhinitis: Secondary | ICD-10-CM

## 2023-07-22 DIAGNOSIS — J8410 Pulmonary fibrosis, unspecified: Secondary | ICD-10-CM | POA: Diagnosis not present

## 2023-07-22 DIAGNOSIS — Z87891 Personal history of nicotine dependence: Secondary | ICD-10-CM

## 2023-07-22 MED ORDER — RYALTRIS 665-25 MCG/ACT NA SUSP: 2.0000 | Freq: Two times a day (BID) | NASAL | 1 refills | Status: DC

## 2023-07-22 MED ORDER — ASMANEX (60 METERED DOSES) 220 MCG/ACT IN AEPB
1.0000 | INHALATION_SPRAY | Freq: Two times a day (BID) | RESPIRATORY_TRACT | 1 refills | Status: DC
  Filled 2023-07-25: qty 1, 30d supply, fill #0

## 2023-07-22 MED ORDER — LORATADINE 10 MG PO TABS: 10.0000 mg | ORAL_TABLET | Freq: Every day | ORAL | 1 refills | Status: DC | PRN

## 2023-07-22 NOTE — Progress Notes (Unsigned)
[place] - [place] - [place] - [place] - [place]   Dear Amber Bennett,  Thank you for referring Amber Bennett to the [facility] [place] on 07/22/2023.   Below is a summation of this patient's evaluation and recommendations.  Thank you for your referral. I will keep you informed about this patient's response to treatment.   If you have any questions please do not hesitate to contact me.   Sincerely,  Amber Priest, MD Allergy / Immunology [facility]   ______________________________________________________________________    NEW PATIENT NOTE   Referring Provider: Rodrigo Ran, MD Primary Provider: Rodrigo Ran, MD Date of office visit: 07/22/2023    Subjective:   Chief Complaint:  Amber Bennett (DOB: 1950/11/12) is a 73 y.o. female who presents to the clinic on 07/22/2023 with a chief complaint of Establish Care (Short of breath with long distances. Years ago, was told she was allergic to tylenol, would like clarification. Continuous runny nose, clear drainage, yellow/clear phlegm. Sneezing.) .     HPI: Amber Bennett presents to this clinic in evaluation of shortness of breath.  Dania's history dates back to the past 2 years.  She has noticed that whenever she walks she gets out of breath and she needs to stop this activity and take a rest for 5 to 10 minutes to catch her breath.  This has been somewhat of a consistent issue ever since she had a hip replacement complicated by a pneumonia 2 years ago complicated by atrial fibrillation requiring an ablation and administration of amiodarone.  It is interesting to note that when she exercises and water aerobics she can complete this activity without much difficulty.  She has also developed an issue with sneezing all the time ever since she started her CPAP in June 2024.  Her nose is also very runny.  She has tried various nose sprays which have not  helped.  Sounds as though she has used Flonase and ipratropium.  She is also been using an antihistamine and a leukotriene modifier and she is not sure that these have helped very much.  She has a history of developing a reaction to Tylenol at around the age of 28.  She took a Tylenol she had lip swelling and mouth swelling and itchy palms and throat itching that she treated with Benadryl.  She did smoke from 1978-2007 at 1/4 pack of cigarettes per day.  She is currently being treated with hydroxychloroquine for what sounds like dermatomyositis or possible rheumatoid arthritis or an overlap of the 2 by Dr. Dierdre Bennett.  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   Carpal tunnel syndrome    Cervical radiculitis    Collar bone fracture    Depression    Dermatomyositis (HCC)    Diabetes mellitus without complication (HCC)    Elevated liver enzymes    External hemorrhoids    Fatty liver    Fibromyalgia affecting hand 08/2022   bilateral wrist and hands   Hematuria    negative evaluation   High cholesterol    HTN (hypertension)    Internal hemorrhoids    Laryngitis    Lower back pain    Macular degeneration    Osteopenia    Left side hip   Seizures (HCC)    09/07/2019 pt reports last seizure in 2014  Tubular adenoma of colon     Past Surgical History:  Procedure Laterality Date   ABDOMINAL HYSTERECTOMY  1982   ADENOIDECTOMY     APPENDECTOMY     ATRIAL FIBRILLATION ABLATION N/A 03/13/2023   Procedure: ATRIAL FIBRILLATION ABLATION;  Surgeon: Regan Lemming, MD;  Location: [facility] IN[facility];  Service: Cardiovascular;  Laterality: N/A;   BACK SURGERY     BACK SURGERY  07/2015   CARPAL TUNNEL RELEASE     CATARACT EXTRACTION, BILATERAL     CHOLECYSTECTOMY  2003   COLONOSCOPY     EXPLORATORY LAPAROTOMY     LAMINECTOMY     SHOULDER SURGERY Right 2000   SINOSCOPY     TONSILLECTOMY     TOTAL HIP  ARTHROPLASTY Right 02/12/2022   Procedure: RIGHT TOTAL HIP ARTHROPLASTY ANTERIOR APPROACH;  Surgeon: Kathryne Hitch, MD;  Location: [facility] OR;  Service: Orthopedics;  Laterality: Right;    Allergies as of 07/22/2023       Reactions   Tylenol [acetaminophen] Swelling, Other (See Comments)   Throat swelling, anaphylaxis   Aspirin Other (See Comments)   gi bleeding in 2005   Cefdinir Other (See Comments)   Other Reaction(s): hives, itching   Colesevelam Other (See Comments)   Other Reaction(s): has never tried it but would not be a good choice as all seizure meds would have to be taken at least four hours prior to the dose and with her regimen that just won't be possible.   Ezetimibe-simvastatin Other (See Comments)   Other Reaction(s): elevated LFT   Penicillin G Benzathine Itching   Rosuvastatin    Other Reaction(s): skin rash and myalgia   Wound Dressing Adhesive Other (See Comments)   Latex Itching   Rash        Medication List    CALTRATE 600+D3 PO Take 1 tablet by mouth daily.   CO Q 10 PO Take 200 mg by mouth daily.   colestipol 1 g tablet Commonly known as: COLESTID Take 2 tablets (2 g total) by mouth at bedtime.   diphenoxylate-atropine 2.5-0.025 MG tablet Commonly known as: LOMOTIL TAKE 1 TABLET BY MOUTH EVERY 4 HOURS AS NEEDED FOR DIARRHEA OR LOOSE STOOLS.   DULoxetine 30 MG capsule Commonly known as: CYMBALTA Take 30 mg by mouth every evening.   ezetimibe 10 MG tablet Commonly known as: ZETIA Take 10 mg by mouth every evening.   fluticasone 50 MCG/ACT nasal spray Commonly known as: FLONASE Place 2 sprays into both nostrils daily.   furosemide 20 MG tablet Commonly known as: LASIX Take 40 mg TWICE DAILY of Furosemide until your weight is less than 159 pounds.  Once your weight is less than 159 pounds, take only 20 mg of Furosemide daily   gabapentin 800 MG tablet Commonly known as: NEURONTIN Take 1 tablet (800 mg total) by mouth 2 (two) times  daily.   hyoscyamine 0.125 MG SL tablet Commonly known as: LEVSIN SL Place 1 tablet (0.125 mg total) under the tongue every 6 (six) hours as needed.   ibandronate 150 MG tablet Commonly known as: BONIVA Take 150 mg by mouth every 30 (thirty) days.   ipratropium 0.03 % nasal spray Commonly known as: ATROVENT Place 2 sprays into both nostrils every 12 (twelve) hours.   loratadine 10 MG tablet Commonly known as: CLARITIN Take 10 mg by mouth daily.   metoprolol tartrate 50 MG tablet Commonly known as: LOPRESSOR Take 1 tablet (50 mg total) by mouth 2 (two) times  daily.   montelukast 10 MG tablet Commonly known as: SINGULAIR Take 10 mg by mouth at bedtime.   Mounjaro 2.5 MG/0.5ML Pen Generic drug: tirzepatide Inject 2.5 mg into the skin once a week.   Mounjaro 5 MG/0.5ML Pen Generic drug: tirzepatide Inject into the skin.   MULTIVITAMIN PO Take 1 tablet by mouth daily.   omega-3 acid ethyl esters 1 g capsule Commonly known as: LOVAZA Take 2 g by mouth 2 (two) times daily.   omeprazole 20 MG capsule Commonly known as: PRILOSEC Take 20 mg by mouth 2 (two) times daily before a meal.   PHENobarbital 97.2 MG tablet Commonly known as: LUMINAL TAKE 1 AND 1/2 TABLETS BY MOUTH EVERY DAY   potassium chloride SA 20 MEQ tablet Commonly known as: KLOR-CON M While taking 40 mg of Furosemide, take Potassium Chloride 20 meq twice a day. When you decrease Furosemide to 20 mg, decrease Potassium Chloride to 20 meq once a day.   Praluent 150 MG/ML Soaj Generic drug: Alirocumab Inject 150 mg into the skin every 14 (fourteen) days.   PRESERVISION AREDS PO Take 1 capsule by mouth daily.   PROBIOTIC PO Take 1 capsule by mouth daily.   pyridoxine 100 MG tablet Commonly known as: B-6 Take 100 mg by mouth daily.   rivaroxaban 20 MG Tabs tablet Commonly known as: XARELTO Take 1 tablet (20 mg total) by mouth daily with supper.   telmisartan 40 MG tablet Commonly known as:  MICARDIS Take 40 mg by mouth daily.   tiZANidine 4 MG tablet Commonly known as: ZANAFLEX Take 1 tablet (4 mg total) by mouth 2 (two) times daily as needed for muscle spasms.   topiramate 50 MG tablet Commonly known as: TOPAMAX TAKE 1 TABLET BY MOUTH EVERY MORNING AND 2 TABLETS EVERY EVENING   vitamin C 1000 MG tablet Take 1,000 mg by mouth daily. Airborne emergen c   Vitamin D 50 MCG (2000 UT) tablet Take 2,000 Units by mouth daily.   vitamin E 180 MG (400 UNITS) capsule Take 400 Units by mouth daily.    Review of systems negative except as noted in HPI / PMHx or noted below:  Review of Systems  Constitutional: Negative.   HENT: Negative.    Eyes: Negative.   Respiratory: Negative.    Cardiovascular: Negative.   Gastrointestinal: Negative.   Genitourinary: Negative.   Musculoskeletal: Negative.   Skin: Negative.   Neurological: Negative.   Endo/Heme/Allergies: Negative.   Psychiatric/Behavioral: Negative.      Family History  Problem Relation Age of Onset   Cancer Mother        Cancer of the bone marrow   Heart attack Mother    Stroke Sister    Hypertension Sister    Diabetes Sister    Colon cancer Maternal Aunt    Seizures Neg Hx    Esophageal cancer Neg Hx    Rectal cancer Neg Hx    Stomach cancer Neg Hx     Social History   Socioeconomic History   Marital status: Married    Spouse name: Not on file   Number of children: 2   Years of education: 14   Highest education level: Not on file  Occupational History   Occupation: Employed    Comment: Works for [employer], [employer] and [employer]  Tobacco Use   Smoking status: Former    Current packs/day: 0.00    Types: Cigarettes    Quit date: 07/01/1978    Years since quitting: 45.0  Smokeless tobacco: Never   Tobacco comments:    Former smoker 04/10/23  Vaping Use   Vaping status: Never Used  Substance and Sexual Activity   Alcohol use: Yes    Alcohol/week: 7.0 standard drinks of alcohol    Types: 7  Glasses of wine per week    Comment: 1 glass of white wine nightly 04/10/23   Drug use: No   Sexual activity: Yes    Partners: Male    Birth control/protection: Surgical    Comment: TAH  Other Topics Concern   Not on file  Social History Narrative   Patient lives at home with her husband Amber Bennett).   Retired.   Education two years of business college.   Right handed.   Caffeine     Social Drivers of Corporate investment banker Strain: Low Risk  (06/17/2018)   Received from [facility], [facility]   Overall Financial Resource Strain (CARDIA)    Difficulty of Paying Living Expenses: Not hard at all  Food Insecurity: No Food Insecurity (06/17/2018)   Received from [facility], [facility]   Hunger Vital Sign    Worried About Running Out of Food in the Last Year: Never true    Bennett Out of Food in the Last Year: Never true  Transportation Needs: No Transportation Needs (06/17/2018)   Received from [facility], [facility]   [facility] - Transportation    In the past 12 months, has lack of transportation kept you from medical appointments or from getting medications?: No    Lack of Transportation (Non-Medical): No  Physical Activity: Not on file  Stress: No Stress Concern Present (06/17/2018)   Received from [facility], [facility]   [employer] of Occupational Health - Occupational Stress Questionnaire    Feeling of Stress : Only a little  Social Connections: Unknown (06/17/2018)   Received from [facility], [facility]   Social Connection and Isolation Panel [NHANES]    Frequency of Communication with Friends and Family: Not on file    Frequency of Social Gatherings with Friends and Family: Not on file    Attends Religious Services: Not on file    Active Member of Clubs or Organizations: Not on  file    Attends Banker Meetings: Not on file    Marital Status: Married  Catering manager Violence: Not on Scientist, research (life sciences) and Social history  Lives in a house with a dry environment, no animals located inside the household, carpet in the bedroom, plastic on the bed, no plastic on the pillow, no smoking ongoing with inside the household.    Objective:   Vitals:   07/22/23 1418  BP: 128/78  Pulse: 71  Resp: 20  Temp: 98 F (36.7 C)  SpO2: 97%   Height: 4\' 10"  (147.3 cm) Weight: 161 lb 14.4 oz (73.4 kg)  Physical Exam Constitutional:      Appearance: She is not diaphoretic.  HENT:     Head: Normocephalic.     Right Ear: Tympanic membrane, ear canal and external ear normal.     Left Ear: Ear canal and external ear normal. Tympanic membrane is scarred.     Nose: Nose normal. No mucosal edema or rhinorrhea.     Mouth/Throat:     Pharynx: Uvula midline. No oropharyngeal exudate.  Eyes:     Conjunctiva/sclera: Conjunctivae  normal.  Neck:     Thyroid: No thyromegaly.     Trachea: Trachea normal. No tracheal tenderness or tracheal deviation.  Cardiovascular:     Rate and Rhythm: Normal rate and regular rhythm.     Heart sounds: Normal heart sounds, S1 normal and S2 normal. No murmur heard. Pulmonary:     Effort: No respiratory distress.     Breath sounds: Normal breath sounds. No stridor. No wheezing or rales.  Lymphadenopathy:     Head:     Right side of head: No tonsillar adenopathy.     Left side of head: No tonsillar adenopathy.     Cervical: No cervical adenopathy.  Skin:    Findings: No erythema or rash.     Nails: There is no clubbing.  Neurological:     Mental Status: She is alert.     Diagnostics: Allergy skin tests were not performed.   Spirometry was performed and demonstrated an FEV1 of 1.21 @ 72 % of predicted. FEV1/FVC = 0.75  Oxygen saturation at rest on room air was 97%.  Oxygen saturation while walking the hall on room air  was 95%.  Results of a coronary morphology CT scan obtained for September 2024 identified the following:  The heart size is within normal limits. No pericardial fluid is identified. Visualized segments of the thoracic aorta and central pulmonary arteries are normal in caliber. Visualized mediastinum and hilar regions demonstrate no lymphadenopathy or masses. Stable bilateral pulmonary scarring and mild right middle lobe bronchiectasis likely reflecting prior inflammation/infection. Visualized lungs show no evidence of pulmonary edema, consolidation, pneumothorax, nodule or pleural fluid.  Results of blood tests obtained 27 February 2023 identifies WBC 7.0, hemoglobin 12.8, platelet 156.  Assessment and Plan:    1. Not well controlled severe persistent asthma   2. Dyspnea on exertion   3. Pulmonary fibrosis (HCC)   4. Seasonal allergic rhinitis, unspecified trigger    1. Blood - CBC w/d, area 2 aeroallergen profile  2. High resolution chest CT scan   3. Treat inflammation of airway:   A. Asmanex 220 Twisthaler - 1 inhalation 2 times per day (empty lungs)  B. Ryaltris - 2 sprays each nostril 2 times per day  C. Stay on Montelukast 10 mg - 1 tablet 1 time per day  4. If needed:   A. Loratadine 10 mg - 1 tablet 1 time per day  5. Return to clinic in 4 weeks or earlier if problem  6. Influenza = Tamiflu. Covid = Paxlovid  7. Tylenol challenge???   Amber Priest, MD Allergy / Immunology [facility] [place]

## 2023-07-22 NOTE — Patient Instructions (Addendum)
  1. Blood - CBC w/d, area 2 aeroallergen profile  2. High resolution chest CT scan   3. Treat inflammation of airway:   A. Asmanex 220 Twisthaler - 1 inhalation 2 times per day (empty lungs)  B. Ryaltris - 2 sprays each nostril 2 times per day  C. Stay on Montelukast 10 mg - 1 tablet 1 time per day  4. If needed:   A. Loratadine 10 mg - 1 tablet 1 time per day  5. Return to clinic in 4 weeks or earlier if problem  6. Influenza = Tamiflu. Covid = molnupiravir  7. Tylenol challenge???

## 2023-07-23 ENCOUNTER — Encounter: Payer: Self-pay | Admitting: Allergy and Immunology

## 2023-07-25 ENCOUNTER — Other Ambulatory Visit (HOSPITAL_COMMUNITY): Payer: Self-pay

## 2023-07-25 LAB — ALLERGEN PROFILE WITH TOTAL IGE, RESPIRATORY-AREA 2

## 2023-07-25 LAB — CBC WITH DIFFERENTIAL/PLATELET
Basophils Absolute: 0.1 x10E3/uL (ref 0.0–0.2)
Basos: 1 %
EOS (ABSOLUTE): 0.1 x10E3/uL (ref 0.0–0.4)
Eos: 2 %
Hematocrit: 37.9 % (ref 34.0–46.6)
Hemoglobin: 13 g/dL (ref 11.1–15.9)
Immature Grans (Abs): 0 x10E3/uL (ref 0.0–0.1)
Immature Granulocytes: 0 %
Lymphocytes Absolute: 1.6 x10E3/uL (ref 0.7–3.1)
Lymphs: 26 %
MCH: 33.9 pg — ABNORMAL HIGH (ref 26.6–33.0)
MCHC: 34.3 g/dL (ref 31.5–35.7)
MCV: 99 fL — ABNORMAL HIGH (ref 79–97)
Monocytes Absolute: 0.7 x10E3/uL (ref 0.1–0.9)
Monocytes: 11 %
Neutrophils Absolute: 3.5 x10E3/uL (ref 1.4–7.0)
Neutrophils: 60 %
Platelets: 163 x10E3/uL (ref 150–450)
RBC: 3.84 x10E6/uL (ref 3.77–5.28)
RDW: 11.6 % — ABNORMAL LOW (ref 11.7–15.4)
WBC: 5.9 x10E3/uL (ref 3.4–10.8)

## 2023-07-29 ENCOUNTER — Other Ambulatory Visit (HOSPITAL_COMMUNITY): Payer: Self-pay

## 2023-07-30 ENCOUNTER — Telehealth: Payer: Self-pay

## 2023-07-30 NOTE — Telephone Encounter (Signed)
Received fax from [facility]- DOB verified- stating:  Mometasone (Asmanex) 220 mcg/act is not covered by patient's insurance.  Preferred alternatives:  Arnuity  Qvar   Forwarding message to provider.

## 2023-07-31 ENCOUNTER — Other Ambulatory Visit (HOSPITAL_COMMUNITY): Payer: Self-pay

## 2023-07-31 ENCOUNTER — Ambulatory Visit (HOSPITAL_COMMUNITY)
Admission: RE | Admit: 2023-07-31 | Discharge: 2023-07-31 | Disposition: A | Discharge status: Home / Self Care | Payer: Medicare Other | Source: Ambulatory Visit | Attending: Allergy and Immunology | Admitting: Allergy and Immunology

## 2023-07-31 ENCOUNTER — Telehealth: Payer: Self-pay | Admitting: Allergy and Immunology

## 2023-07-31 DIAGNOSIS — R911 Solitary pulmonary nodule: Secondary | ICD-10-CM | POA: Diagnosis not present

## 2023-07-31 DIAGNOSIS — J8410 Pulmonary fibrosis, unspecified: Secondary | ICD-10-CM | POA: Insufficient documentation

## 2023-07-31 DIAGNOSIS — S2231XA Fracture of one rib, right side, initial encounter for closed fracture: Secondary | ICD-10-CM | POA: Diagnosis not present

## 2023-07-31 DIAGNOSIS — J984 Other disorders of lung: Secondary | ICD-10-CM | POA: Diagnosis not present

## 2023-07-31 DIAGNOSIS — R918 Other nonspecific abnormal finding of lung field: Secondary | ICD-10-CM | POA: Diagnosis not present

## 2023-07-31 MED ORDER — APIXABAN 5 MG PO TABS: 5.0000 mg | ORAL_TABLET | Freq: Two times a day (BID) | ORAL | 0 refills | Status: DC

## 2023-07-31 MED ORDER — MOUNJARO 5 MG/0.5ML ~~LOC~~ SOAJ: 5.0000 mg | SUBCUTANEOUS | 5 refills | Status: DC

## 2023-07-31 MED ORDER — AMIODARONE HCL 200 MG PO TABS: ORAL_TABLET | ORAL | 0 refills | Status: DC

## 2023-07-31 MED ORDER — AMLODIPINE BESYLATE 2.5 MG PO TABS: 2.5000 mg | ORAL_TABLET | Freq: Every day | ORAL | 3 refills | Status: AC

## 2023-07-31 MED ORDER — FUROSEMIDE 20 MG PO TABS: 40.0000 mg | ORAL_TABLET | Freq: Every day | ORAL | 3 refills | Status: DC

## 2023-07-31 MED ORDER — HYDROXYCHLOROQUINE SULFATE 200 MG PO TABS: 200.0000 mg | ORAL_TABLET | Freq: Every day | ORAL | 1 refills | Status: DC

## 2023-07-31 MED ORDER — MOUNJARO 10 MG/0.5ML ~~LOC~~ SOAJ
10.0000 mg | SUBCUTANEOUS | 3 refills | Status: DC
  Filled 2023-07-31 – 2023-08-01 (×2): qty 2, 28d supply, fill #0

## 2023-07-31 MED ORDER — FLUTICASONE PROPIONATE 50 MCG/ACT NA SUSP: 2.0000 | Freq: Every day | NASAL | 3 refills | Status: DC

## 2023-07-31 MED ORDER — GABAPENTIN 800 MG PO TABS: 800.0000 mg | ORAL_TABLET | Freq: Two times a day (BID) | ORAL | 3 refills | Status: AC

## 2023-07-31 MED ORDER — OMEGA-3-ACID ETHYL ESTERS 1 G PO CAPS: 2.0000 g | ORAL_CAPSULE | Freq: Two times a day (BID) | ORAL | 3 refills | Status: DC

## 2023-07-31 MED ORDER — DULOXETINE HCL 30 MG PO CPEP: 30.0000 mg | ORAL_CAPSULE | Freq: Every evening | ORAL | 4 refills | Status: AC

## 2023-07-31 MED ORDER — APIXABAN 5 MG PO TABS: 5.0000 mg | ORAL_TABLET | Freq: Two times a day (BID) | ORAL | 4 refills | Status: DC

## 2023-07-31 MED ORDER — PRALUENT 150 MG/ML ~~LOC~~ SOAJ: 150.0000 mg | SUBCUTANEOUS | 3 refills | Status: AC

## 2023-07-31 MED ORDER — MOMETASONE FUROATE 220 MCG/ACT IN AEPB: 1.0000 | INHALATION_SPRAY | Freq: Two times a day (BID) | RESPIRATORY_TRACT | 1 refills | Status: DC

## 2023-07-31 MED ORDER — METOPROLOL TARTRATE 50 MG PO TABS: 50.0000 mg | ORAL_TABLET | Freq: Two times a day (BID) | ORAL | 3 refills | Status: DC

## 2023-07-31 MED ORDER — OZEMPIC (1 MG/DOSE) 4 MG/3ML ~~LOC~~ SOPN: 1.0000 mg | PEN_INJECTOR | SUBCUTANEOUS | 6 refills | Status: DC

## 2023-07-31 MED ORDER — METOPROLOL TARTRATE 25 MG PO TABS: 25.0000 mg | ORAL_TABLET | Freq: Two times a day (BID) | ORAL | 3 refills | Status: DC

## 2023-07-31 MED ORDER — MOUNJARO 2.5 MG/0.5ML ~~LOC~~ SOAJ: 2.5000 mg | SUBCUTANEOUS | 5 refills | Status: DC

## 2023-07-31 MED ORDER — QVAR REDIHALER 80 MCG/ACT IN AERB
1.0000 | INHALATION_SPRAY | Freq: Two times a day (BID) | RESPIRATORY_TRACT | 1 refills | Status: DC
  Filled 2023-08-01: qty 10.6, 60d supply, fill #0

## 2023-07-31 MED ORDER — MONTELUKAST SODIUM 10 MG PO TABS: 10.0000 mg | ORAL_TABLET | Freq: Every day | ORAL | 3 refills | Status: DC

## 2023-07-31 MED ORDER — CYCLOBENZAPRINE HCL 5 MG PO TABS: 5.0000 mg | ORAL_TABLET | Freq: Three times a day (TID) | ORAL | 1 refills | Status: DC | PRN

## 2023-07-31 NOTE — Telephone Encounter (Addendum)
Per Provider:  Has she not used any ICS since her visit 1 week ago? Use the Qvar 80 in place of Asmanex.       Called patient - DOB/Pharmacy verified - advised of provider notation above.   Called [facility]  - spoke to [place], [place]. Mgr - DOB verified - stated patient called early and requested all of medications be transferred to [facility].  Called patient back - stated she did request the above notation due to [facility] staying out of stock of medications she needs/prescribes to much.  Patient stated she will pick the Qvar 80 mc once pharmacy notifies her it's ready for up.  Forwarding updated message to provider.

## 2023-07-31 NOTE — Addendum Note (Signed)
Addended by: Robet Leu A on: 07/31/2023 03:11 PM   Modules accepted: Orders

## 2023-07-31 NOTE — Telephone Encounter (Deleted)
Marland Kitchen

## 2023-07-31 NOTE — Addendum Note (Signed)
Addended by: Robet Leu A on: 07/31/2023 05:05 PM   Modules accepted: Orders

## 2023-07-31 NOTE — Telephone Encounter (Signed)
Patient called and stated the prescription for the Asmanex was not covered through her insurance. She called her insurance and she stated they told her that the Generic Prescription of the Asmanex was covered. She stated it was either the Fluticasone or the Wixela that is covered by her insurance. Patient wants the prescription sent over to the [facility] pharmacy on [address]. Patient would like a call back when the prescription is sent in.   Best Contact: [phone]  Patient also stated the Ryaltris sample that was given to her was not working too well for her. She stated she is still sneezing all the time.

## 2023-08-01 ENCOUNTER — Other Ambulatory Visit (HOSPITAL_COMMUNITY): Payer: Self-pay

## 2023-08-04 ENCOUNTER — Other Ambulatory Visit (HOSPITAL_COMMUNITY): Payer: Self-pay

## 2023-08-05 ENCOUNTER — Encounter: Payer: Self-pay | Admitting: Allergy and Immunology

## 2023-08-05 ENCOUNTER — Encounter: Payer: Self-pay | Admitting: Pulmonary Disease

## 2023-08-05 ENCOUNTER — Ambulatory Visit: Payer: Medicare Other | Admitting: Pulmonary Disease

## 2023-08-05 VITALS — BP 128/60 | HR 68 | Temp 97.4°F | Ht 58.5 in | Wt 161.2 lb

## 2023-08-05 DIAGNOSIS — M3313 Other dermatomyositis without myopathy: Secondary | ICD-10-CM

## 2023-08-05 DIAGNOSIS — J479 Bronchiectasis, uncomplicated: Secondary | ICD-10-CM | POA: Diagnosis not present

## 2023-08-05 DIAGNOSIS — R9389 Abnormal findings on diagnostic imaging of other specified body structures: Secondary | ICD-10-CM

## 2023-08-05 LAB — SEDIMENTATION RATE: Sed Rate: 9 mm/h (ref 0–30)

## 2023-08-05 LAB — C-REACTIVE PROTEIN: CRP: 1.1 mg/dL (ref 0.5–20.0)

## 2023-08-05 MED ORDER — IPRATROPIUM-ALBUTEROL 0.5-2.5 (3) MG/3ML IN SOLN: 3.0000 mL | Freq: Three times a day (TID) | RESPIRATORY_TRACT | 5 refills | Status: AC | PRN

## 2023-08-05 NOTE — Progress Notes (Signed)
 Synopsis: Referred in February 2024 for Shortness of Breath  Subjective:   PATIENT ID: Amber Bennett GENDER: female DOB: Dec 11, 1950, MRN: [id number]   HPI  Chief Complaint  Patient presents with   Consult    Pt is complaining on sneezing w/ runny nose, sob w/ wheezing during exertion (x2 yrs)    Amber Bennett is a 73 year old woman, former smoker with history of dermatomyositis, DMII, atrial fibrillation s/p ablation and hypertension who is referred to pulmonary clinic for shortness of breath.   Records reviewed from [facility]. She had pneumonia in 2023.   She has experienced progressive shortness of breath over the past two years, which has worsened over time. She finds it difficult to walk to her grandson's soccer field without stopping due to breathlessness. No syncope, but she describes an inability to breathe. Some wheezing is present, but usually no cough, although she produces a lot of phlegm and has nasal congestion. She has a history of AFib and underwent an ablation procedure, which did not alleviate her shortness of breath. Her blood pressure has improved post-ablation, but the breathing issues persist. She has been on montelukast  for allergies and recently started on Asmanex  inhaler.  She has a history of dermatomyositis, diagnosed in 2010, with symptoms including morning hand stiffness and swelling, particularly in the wrist area. She was treated with steroids for a month without improvement and is currently on Plaquenil , which has not alleviated her symptoms but has caused hair loss. She reports fatigue, sleeping 10-12 hours, and feeling tired enough to nap shortly after waking. She uses a CPAP machine for sleep apnea diagnosed during her Afib evaluation.  She reports a swollen gland near her neck for the past 4-5 months, which is not tender but causes discomfort when turning her neck. She has experienced issues with swallowing, particularly with chicken, and  underwent an endoscopy a few years ago that was normal. She has a history of hoarseness and underwent vocal therapy after a hip replacement surgery.  She has a history of a broken collarbone and hip replacement, which have limited her physical activity. She mentions weight gain due to inactivity, having increased from a size 4 to a size 10-12. She is unable to drive due to the collarbone injury and experiences groin pain after walking. She has a history of smoking, having quit at age 58 after smoking since her teenage years.  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   Carpal tunnel syndrome    Cervical radiculitis    Collar bone fracture    Depression    Dermatomyositis (HCC)    Diabetes mellitus without complication (HCC)    Elevated liver enzymes    External hemorrhoids    Fatty liver    Fibromyalgia affecting hand 08/2022   bilateral wrist and hands   Hematuria    negative evaluation   High cholesterol    HTN (hypertension)    Internal hemorrhoids    Laryngitis    Lower back pain    Macular degeneration    Osteopenia    Left side hip   Seizures (HCC)    09/07/2019 pt reports last seizure in 2014   Tubular adenoma of colon      Family History  Problem Relation Age of Onset   Cancer Mother        Cancer of the bone marrow  Heart attack Mother    Stroke Sister    Hypertension Sister    Diabetes Sister    Colon cancer Maternal Aunt    Seizures Neg Hx    Esophageal cancer Neg Hx    Rectal cancer Neg Hx    Stomach cancer Neg Hx      Social History   Socioeconomic History   Marital status: Married    Spouse name: Not on file   Number of children: 2   Years of education: 14   Highest education level: Not on file  Occupational History   Occupation: Employed    Comment: Works for [employer], [place] and [employer]  Tobacco Use   Smoking status: Former    Current packs/day: 0.00     Types: Cigarettes    Quit date: 07/01/1978    Years since quitting: 45.1   Smokeless tobacco: Never   Tobacco comments:    Former smoker 04/10/23  Vaping Use   Vaping status: Never Used  Substance and Sexual Activity   Alcohol  use: Yes    Alcohol /week: 7.0 standard drinks of alcohol     Types: 7 Glasses of wine per week    Comment: 1 glass of white wine nightly 04/10/23   Drug use: No   Sexual activity: Yes    Partners: Male    Birth control/protection: Surgical    Comment: TAH  Other Topics Concern   Not on file  Social History Narrative   Patient lives at home with her husband Amber Bennett).   Retired.   Education two years of business college.   Right handed.   Caffeine     Social Drivers of Corporate Investment Banker Strain: Low Risk  (06/17/2018)   Received from [facility], [facility]   Overall Financial Resource Strain (CARDIA)    Difficulty of Paying Living Expenses: Not hard at all  Food Insecurity: No Food Insecurity (06/17/2018)   Received from [facility], [facility]   Hunger Vital Sign    Worried About Running Out of Food in the Last Year: Never true    Ran Out of Food in the Last Year: Never true  Transportation Needs: No Transportation Needs (06/17/2018)   Received from [facility], [facility]   [facility] - Transportation    In the past 12 months, has lack of transportation kept you from medical appointments or from getting medications?: No    Lack of Transportation (Non-Medical): No  Physical Activity: Not on file  Stress: No Stress Concern Present (06/17/2018)   Received from [facility], [facility]   [employer] of Occupational Health - Occupational Stress Questionnaire    Feeling of Stress : Only a little  Social Connections: Unknown (06/17/2018)   Received from [facility], [facility]   Social Connection and Isolation Panel [NHANES]    Frequency of Communication with Friends and Family: Not on file    Frequency of Social Gatherings with Friends and Family: Not on file    Attends Religious Services: Not on file    Active Member of Clubs or Organizations: Not on file    Attends Banker Meetings: Not on file    Marital Status: Married  Intimate Partner Violence: Not on file     Allergies  Allergen Reactions   Tylenol  [Acetaminophen ] Swelling and Other (See Comments)    Throat swelling, anaphylaxis  Aspirin  Other (See Comments)    gi bleeding in 2005   Cefdinir Other (See Comments)    Other Reaction(s): hives, itching   Colesevelam Other (See Comments)    Other Reaction(s): has never tried it but would not be a good choice as all seizure meds would have to be taken at least four hours prior to the dose and with her regimen that just won't be possible.   Ezetimibe -Simvastatin Other (See Comments)    Other Reaction(s): elevated LFT   Penicillin G Benzathine Itching   Rosuvastatin     Other Reaction(s): skin rash and myalgia   Wound Dressing Adhesive Other (See Comments)   Latex Itching    Rash      Outpatient Medications Prior to Visit  Medication Sig Dispense Refill   Alirocumab  (PRALUENT ) 150 MG/ML SOAJ Inject 1 mL (150 mg total) into the skin every 14 (fourteen) days. 6 mL 3   amLODipine  (NORVASC ) 2.5 MG tablet Take 1 tablet (2.5 mg total) by mouth at bedtime. 90 tablet 3   Ascorbic Acid (VITAMIN C) 1000 MG tablet Take 1,000 mg by mouth daily. Airborne emergen c     beclomethasone (QVAR  REDIHALER) 80 MCG/ACT inhaler Inhale 1 puff into the lungs 2 (two) times daily. 31.8 g 1   Calcium Carb-Cholecalciferol  (CALTRATE 600+D3 PO) Take 1 tablet by mouth daily.     Cholecalciferol  (VITAMIN D ) 50 MCG (2000 UT) tablet Take 2,000 Units by mouth daily.     Coenzyme Q10 (CO Q 10 PO) Take 200 mg by mouth daily.     cyclobenzaprine   (FLEXERIL ) 5 MG tablet Take 1 tablet (5 mg total) by mouth every 8 (eight) hours as needed. 30 tablet 1   diphenoxylate -atropine  (LOMOTIL ) 2.5-0.025 MG tablet TAKE 1 TABLET BY MOUTH EVERY 4 HOURS AS NEEDED FOR DIARRHEA OR LOOSE STOOLS. 90 tablet 1   DULoxetine  (CYMBALTA ) 30 MG capsule Take 30 mg by mouth every evening.  5   DULoxetine  (CYMBALTA ) 30 MG capsule Take 1 capsule (30 mg total) by mouth every evening. 90 capsule 4   ezetimibe  (ZETIA ) 10 MG tablet Take 10 mg by mouth every evening.     fluticasone  (FLONASE ) 50 MCG/ACT nasal spray Place 2 sprays into both nostrils daily.     fluticasone  (FLONASE ) 50 MCG/ACT nasal spray Place 2 sprays into both nostrils daily as directed 48 g 3   furosemide  (LASIX ) 20 MG tablet Take 2 tablets TWICE DAILY until your weight is less than 159 pounds.  Once your weight is less than 159 pounds, take only 1 tablet daily 180 tablet 3   gabapentin  (NEURONTIN ) 800 MG tablet Take 1 tablet (800 mg total) by mouth 2 (two) times daily. 180 tablet 3   hydroxychloroquine  (PLAQUENIL ) 200 MG tablet Take 1 tablet (200 mg total) by mouth daily with food or milk. 90 tablet 1   ibandronate (BONIVA) 150 MG tablet Take 150 mg by mouth every 30 (thirty) days.     ipratropium (ATROVENT ) 0.03 % nasal spray Place 2 sprays into both nostrils every 12 (twelve) hours. 30 mL 4   loratadine  (CLARITIN ) 10 MG tablet Take 1 tablet (10 mg total) by mouth daily as needed for allergies (Can take an extra dose during flare ups.). 180 tablet 1   metoprolol  tartrate (LOPRESSOR ) 50 MG tablet Take 1 tablet (50 mg total) by mouth 2 (two) times daily.     mometasone  (ASMANEX ) 220 MCG/ACT inhaler Inhale 1 puff into the lungs 2 (two) times daily. 3 each  1   montelukast  (SINGULAIR ) 10 MG tablet Take 10 mg by mouth at bedtime.     montelukast  (SINGULAIR ) 10 MG tablet Take 1 tablet (10 mg total) by mouth daily. 90 tablet 3   Multiple Vitamins-Minerals (MULTIVITAMIN PO) Take 1 tablet by mouth daily.      Multiple Vitamins-Minerals (PRESERVISION AREDS PO) Take 1 capsule by mouth daily.     Olopatadine-Mometasone  (RYALTRIS ) 665-25 MCG/ACT SUSP Place 2 sprays into the nose 2 (two) times daily. 87 g 1   omega-3 acid ethyl esters (LOVAZA ) 1 G capsule Take 2 g by mouth 2 (two) times daily.     omega-3 acid ethyl esters (LOVAZA ) 1 g capsule Take 2 capsules (2 g total) by mouth 2 (two) times daily. 360 capsule 3   PHENobarbital  (LUMINAL) 97.2 MG tablet TAKE 1 AND 1/2 TABLETS BY MOUTH EVERY DAY 135 tablet 2   potassium chloride  SA (KLOR-CON  M) 20 MEQ tablet While taking 40 mg of Furosemide , take Potassium Chloride  20 meq twice a day. When you decrease Furosemide  to 20 mg, decrease Potassium Chloride  to 20 meq once a day. 180 tablet 3   PRALUENT  150 MG/ML SOAJ Inject 150 mg into the skin every 14 (fourteen) days.     pyridoxine  (B-6) 100 MG tablet Take 100 mg by mouth daily.     rivaroxaban  (XARELTO ) 20 MG TABS tablet Take 1 tablet (20 mg total) by mouth daily with supper. 90 tablet 3   telmisartan (MICARDIS) 40 MG tablet Take 40 mg by mouth daily.     tirzepatide  (MOUNJARO ) 10 MG/0.5ML Pen Inject 10 mg into the skin once a week. 2 mL 3   topiramate  (TOPAMAX ) 50 MG tablet 1 tablet (50 mg total) in the morning AND 2 tablets (100 mg total) every evening. 270 tablet 2   vitamin E 400 UNIT capsule Take 400 Units by mouth daily.     apixaban  (ELIQUIS ) 5 MG TABS tablet Take 1 tablet (5 mg total) by mouth 2 (two) times daily. (Patient not taking: Reported on 08/05/2023) 60 tablet 0   apixaban  (ELIQUIS ) 5 MG TABS tablet Take 1 tablet (5 mg total) by mouth 2 (two) times daily. (Patient not taking: Reported on 08/05/2023) 60 tablet 4   colestipol  (COLESTID ) 1 g tablet Take 2 tablets (2 g total) by mouth at bedtime. (Patient not taking: Reported on 08/05/2023) 60 tablet 3   fluticasone  (FLONASE ) 50 MCG/ACT nasal spray Place 2 sprays into both nostrils daily as directed 48 g 3   furosemide  (LASIX ) 20 MG tablet Take 2 tablets  (40 mg total) by mouth daily until your weight is under 159lbs, then take 1 tablet daily. 180 tablet 3   gabapentin  (NEURONTIN ) 800 MG tablet Take 1 tablet (800 mg total) by mouth 2 (two) times daily. (Patient taking differently: Take 800 mg by mouth at bedtime. Additional 400 mg if needed in the middle of the night) 180 tablet 3   hyoscyamine  (LEVSIN  SL) 0.125 MG SL tablet Place 1 tablet (0.125 mg total) under the tongue every 6 (six) hours as needed. (Patient not taking: Reported on 08/05/2023) 30 tablet 6   metoprolol  tartrate (LOPRESSOR ) 25 MG tablet Take 1 tablet (25 mg total) by mouth 2 (two) times daily. 180 tablet 3   metoprolol  tartrate (LOPRESSOR ) 25 MG tablet Take 1 tablet (25 mg total) by mouth 2 (two) times daily. 180 tablet 3   metoprolol  tartrate (LOPRESSOR ) 50 MG tablet Take 1 tablet (50 mg total) by mouth 2 (two) times  daily. (Patient not taking: Reported on 08/05/2023) 180 tablet 3   mometasone  (ASMANEX , 60 METERED DOSES,) 220 MCG/ACT inhaler Inhale 1 puff into the lungs 2 (two) times daily. 3 each 1   MOUNJARO  5 MG/0.5ML Pen Inject into the skin. (Patient not taking: Reported on 08/05/2023)     omeprazole (PRILOSEC) 20 MG capsule Take 20 mg by mouth 2 (two) times daily before a meal.  3   Probiotic Product (PROBIOTIC PO) Take 1 capsule by mouth daily. (Patient not taking: Reported on 08/05/2023)     Semaglutide , 1 MG/DOSE, (OZEMPIC , 1 MG/DOSE,) 4 MG/3ML SOPN Inject 1 mg into the skin once a week. 3 mL 6   tirzepatide  (MOUNJARO ) 2.5 MG/0.5ML Pen Inject 2.5 mg into the skin once a week.     tirzepatide  (MOUNJARO ) 2.5 MG/0.5ML Pen Inject 2.5 mg into the skin once a week. 2 mL 5   tirzepatide  (MOUNJARO ) 5 MG/0.5ML Pen Inject 5 mg into the skin once a week. 2 mL 5   tiZANidine  (ZANAFLEX ) 4 MG tablet Take 1 tablet (4 mg total) by mouth 2 (two) times daily as needed for muscle spasms. (Patient not taking: Reported on 08/05/2023) 30 tablet 0   No facility-administered medications prior to visit.    Review of Systems  Constitutional:  Positive for malaise/fatigue. Negative for chills, fever and weight loss.  HENT:  Negative for congestion, sinus pain and sore throat.   Eyes: Negative.   Respiratory:  Positive for shortness of breath. Negative for cough, hemoptysis, sputum production and wheezing.   Cardiovascular:  Negative for chest pain, palpitations, orthopnea, claudication and leg swelling.  Gastrointestinal:  Negative for abdominal pain, heartburn, nausea and vomiting.  Genitourinary: Negative.   Musculoskeletal:  Positive for joint pain. Negative for myalgias.  Skin:  Negative for rash.  Neurological:  Negative for weakness.  Endo/Heme/Allergies: Negative.   Psychiatric/Behavioral: Negative.     Objective:   Vitals:   08/05/23 1015  BP: 128/60  Pulse: 68  Temp: (!) 97.4 F (36.3 C)  SpO2: 98%  Weight: 161 lb 3.2 oz (73.1 kg)  Height: 4' 10.5 (1.486 m)     Physical Exam Constitutional:      General: She is not in acute distress.    Appearance: Normal appearance. She is obese.  Eyes:     General: No scleral icterus.    Conjunctiva/sclera: Conjunctivae normal.  Cardiovascular:     Rate and Rhythm: Normal rate and regular rhythm.  Pulmonary:     Breath sounds: No wheezing, rhonchi or rales.  Musculoskeletal:     Right lower leg: No edema.     Left lower leg: No edema.  Skin:    General: Skin is warm and dry.  Neurological:     General: No focal deficit present.    CBC    Component Value Date/Time   WBC 5.9 07/22/2023 1524   WBC 10.4 02/13/2022 0559   RBC 3.84 07/22/2023 1524   RBC 2.84 (L) 02/13/2022 0559   HGB 13.0 07/22/2023 1524   HCT 37.9 07/22/2023 1524   PLT 163 07/22/2023 1524   MCV 99 (H) 07/22/2023 1524   MCH 33.9 (H) 07/22/2023 1524   MCH 34.2 (H) 02/13/2022 0559   MCHC 34.3 07/22/2023 1524   MCHC 33.9 02/13/2022 0559   RDW 11.6 (L) 07/22/2023 1524   LYMPHSABS 1.6 07/22/2023 1524   EOSABS 0.1 07/22/2023 1524   BASOSABS 0.1  07/22/2023 1524      Latest Ref Rng & Units 04/03/2023  4:02 PM 03/19/2023   12:00 AM 02/27/2023    3:12 PM  BMP  Glucose 70 - 99 mg/dL 896  893  899   BUN 8 - 27 mg/dL 23  17  18    Creatinine 0.57 - 1.00 mg/dL 9.28  9.21  9.23   BUN/Creat Ratio 12 - 28 32  22  24   Sodium 134 - 144 mmol/L 135  137  138   Potassium 3.5 - 5.2 mmol/L 4.7  4.0  4.1   Chloride 96 - 106 mmol/L 94  98  103   CO2 20 - 29 mmol/L 24  23  20    Calcium 8.7 - 10.3 mg/dL 9.1  8.8  8.5    Chest imaging: HRCT Chest 07/31/23 1. New clustered heterogeneous airspace opacity and nodularity in the inferior right upper lobe measuring 2.4 x 1.6 cm. Additional tiny nodules and small consolidations in the medial left upper lobe and in the right middle lobe. Findings are most consistent with multifocal atypical infection. 2. Mild, bland appearing scarring of the lung bases. No evidence of fibrotic interstitial lung disease. 3. Hepatic steatosis. Coarse contour of the liver, suggestive of cirrhosis. 4. Subacute, partially callused fracture of the anterior right fifth rib.  CT Chest 03/05/23 Cardiovascular: No significant vascular findings. Normal heart size. No pericardial effusion.   Mediastinum/Nodes: No enlarged mediastinal or axillary lymph nodes. Thyroid  gland, trachea, and esophagus demonstrate no significant findings.   Lungs/Pleura: There is mild bibasilar atelectasis. Minimal ground-glass opacities in the right upper lung may represent scarring from prior inflammatory changes in this area. No pleural effusion or pneumothorax.  CTA Chest PE 02/18/22 Cardiovascular: Satisfactory opacification of the pulmonary arteries to the segmental level. No evidence of pulmonary embolism. Normal heart size. No pericardial effusion. Normal caliber thoracic aorta with mild atherosclerotic disease.   Mediastinum/Nodes: Esophagus and thyroid  are unremarkable. No pathologically enlarged lymph nodes seen in the chest.    Lungs/Pleura: Central airways are patent. Mild right upper lobe consolidation with surrounding ground-glass. No consolidation, pleural effusion or pneumothorax.  PFT:     No data to display          Labs:  Path:  Echo:  Heart Catheterization:       Assessment & Plan:   Bronchiectasis without complication (HCC) - Plan: Aldolase, ANA, ANCA screen with reflex titer, Anti-DNA antibody, double-stranded, Anti-scleroderma antibody, Anti-Smith antibody, Centromere Antibodies, C-reactive protein, Cyclic citrul peptide antibody, IgG, Hypersensitivity Pneumonitis, Rheumatoid factor, RNP Antibodies, Sedimentation rate, Sjogren's syndrome antibods(ssa + ssb), MyoMarker 3 Plus Profile (RDL), ipratropium-albuterol  (DUONEB) 0.5-2.5 (3) MG/3ML SOLN, Pulmonary Function Test, Ambulatory Referral for DME, Aldolase, ANA, ANCA screen with reflex titer, Anti-DNA antibody, double-stranded, Anti-scleroderma antibody, Anti-Smith antibody, Centromere Antibodies, C-reactive protein, Cyclic citrul peptide antibody, IgG, Hypersensitivity Pneumonitis, Rheumatoid factor, RNP Antibodies, Sedimentation rate, Sjogren's syndrome antibods(ssa + ssb), MyoMarker 3 Plus Profile (RDL)  Abnormal CT of the chest  Discussion: Tahja Liao is a 73 year old woman, former smoker with history of dermatomyositis, DMII, atrial fibrillation s/p ablation and hypertension who is referred to pulmonary clinic for shortness of breath.   Bronchiectasis, Mild Shortness of Breath Noted shortness of breath with some wheezing and phlegm production. No significant sinus congestion or postnasal drainage. History of bronchiectasis and recent CT showing new nodular foci and nonspecific scarring at lung bases. Possible causes include pulmonary vein stenosis post-ablation, autoimmune/inflammatory disease, deconditioning, and weight gain. -Start nebulizer treatments twice daily with a flutter valve. -Order inflammatory lab testing. -Consider  bronchoscopy depending  on lab results. -Repeat CT scan in 3 months. -Schedule pulmonary function tests.  Dermatomyositis History of dermatomyositis with recent symptoms of hand swelling and pain. Currently on Plaquenil . Did not report significant response to recent steroid treatment. -Repeat ANA and other autoimmune markers. -Consider restarting steroids and adding a second immunosuppressant (methotrexate or Cellcept) depending on lab results.  Deconditioning/Weight Gain Recent surgeries and broken collarbone have limited physical activity leading to weight gain and possible deconditioning. -Encourage gradual increase in physical activity as tolerated, such as water aerobics or stationary bike. -Consider [employer] programs for gym access.   Follow-up in 3 months to monitor progress and adjust treatment as necessary.  65 minutes spent on this visit.   Dorn Chill, MD [facility] Office: [phone]    Current Outpatient Medications:    Alirocumab  (PRALUENT ) 150 MG/ML SOAJ, Inject 1 mL (150 mg total) into the skin every 14 (fourteen) days., Disp: 6 mL, Rfl: 3   amLODipine  (NORVASC ) 2.5 MG tablet, Take 1 tablet (2.5 mg total) by mouth at bedtime., Disp: 90 tablet, Rfl: 3   Ascorbic Acid (VITAMIN C) 1000 MG tablet, Take 1,000 mg by mouth daily. Airborne emergen c, Disp: , Rfl:    beclomethasone (QVAR  REDIHALER) 80 MCG/ACT inhaler, Inhale 1 puff into the lungs 2 (two) times daily., Disp: 31.8 g, Rfl: 1   Calcium Carb-Cholecalciferol  (CALTRATE 600+D3 PO), Take 1 tablet by mouth daily., Disp: , Rfl:    Cholecalciferol  (VITAMIN D ) 50 MCG (2000 UT) tablet, Take 2,000 Units by mouth daily., Disp: , Rfl:    Coenzyme Q10 (CO Q 10 PO), Take 200 mg by mouth daily., Disp: , Rfl:    cyclobenzaprine  (FLEXERIL ) 5 MG tablet, Take 1 tablet (5 mg total) by mouth every 8 (eight) hours as needed., Disp: 30 tablet, Rfl: 1   diphenoxylate -atropine  (LOMOTIL ) 2.5-0.025 MG  tablet, TAKE 1 TABLET BY MOUTH EVERY 4 HOURS AS NEEDED FOR DIARRHEA OR LOOSE STOOLS., Disp: 90 tablet, Rfl: 1   DULoxetine  (CYMBALTA ) 30 MG capsule, Take 30 mg by mouth every evening., Disp: , Rfl: 5   DULoxetine  (CYMBALTA ) 30 MG capsule, Take 1 capsule (30 mg total) by mouth every evening., Disp: 90 capsule, Rfl: 4   ezetimibe  (ZETIA ) 10 MG tablet, Take 10 mg by mouth every evening., Disp: , Rfl:    fluticasone  (FLONASE ) 50 MCG/ACT nasal spray, Place 2 sprays into both nostrils daily., Disp: , Rfl:    fluticasone  (FLONASE ) 50 MCG/ACT nasal spray, Place 2 sprays into both nostrils daily as directed, Disp: 48 g, Rfl: 3   furosemide  (LASIX ) 20 MG tablet, Take 2 tablets TWICE DAILY until your weight is less than 159 pounds.  Once your weight is less than 159 pounds, take only 1 tablet daily, Disp: 180 tablet, Rfl: 3   gabapentin  (NEURONTIN ) 800 MG tablet, Take 1 tablet (800 mg total) by mouth 2 (two) times daily., Disp: 180 tablet, Rfl: 3   hydroxychloroquine  (PLAQUENIL ) 200 MG tablet, Take 1 tablet (200 mg total) by mouth daily with food or milk., Disp: 90 tablet, Rfl: 1   ibandronate (BONIVA) 150 MG tablet, Take 150 mg by mouth every 30 (thirty) days., Disp: , Rfl:    ipratropium (ATROVENT ) 0.03 % nasal spray, Place 2 sprays into both nostrils every 12 (twelve) hours., Disp: 30 mL, Rfl: 4   ipratropium-albuterol  (DUONEB) 0.5-2.5 (3) MG/3ML SOLN, Take 3 mLs by nebulization 3 (three) times daily as needed., Disp: 360 mL, Rfl: 5   loratadine  (CLARITIN ) 10  MG tablet, Take 1 tablet (10 mg total) by mouth daily as needed for allergies (Can take an extra dose during flare ups.)., Disp: 180 tablet, Rfl: 1   metoprolol  tartrate (LOPRESSOR ) 50 MG tablet, Take 1 tablet (50 mg total) by mouth 2 (two) times daily., Disp: , Rfl:    mometasone  (ASMANEX ) 220 MCG/ACT inhaler, Inhale 1 puff into the lungs 2 (two) times daily., Disp: 3 each, Rfl: 1   montelukast  (SINGULAIR ) 10 MG tablet, Take 10 mg by mouth at bedtime.,  Disp: , Rfl:    montelukast  (SINGULAIR ) 10 MG tablet, Take 1 tablet (10 mg total) by mouth daily., Disp: 90 tablet, Rfl: 3   Multiple Vitamins-Minerals (MULTIVITAMIN PO), Take 1 tablet by mouth daily., Disp: , Rfl:    Multiple Vitamins-Minerals (PRESERVISION AREDS PO), Take 1 capsule by mouth daily., Disp: , Rfl:    Olopatadine-Mometasone  (RYALTRIS ) 665-25 MCG/ACT SUSP, Place 2 sprays into the nose 2 (two) times daily., Disp: 87 g, Rfl: 1   omega-3 acid ethyl esters (LOVAZA ) 1 G capsule, Take 2 g by mouth 2 (two) times daily., Disp: , Rfl:    omega-3 acid ethyl esters (LOVAZA ) 1 g capsule, Take 2 capsules (2 g total) by mouth 2 (two) times daily., Disp: 360 capsule, Rfl: 3   PHENobarbital  (LUMINAL) 97.2 MG tablet, TAKE 1 AND 1/2 TABLETS BY MOUTH EVERY DAY, Disp: 135 tablet, Rfl: 2   potassium chloride  SA (KLOR-CON  M) 20 MEQ tablet, While taking 40 mg of Furosemide , take Potassium Chloride  20 meq twice a day. When you decrease Furosemide  to 20 mg, decrease Potassium Chloride  to 20 meq once a day., Disp: 180 tablet, Rfl: 3   PRALUENT  150 MG/ML SOAJ, Inject 150 mg into the skin every 14 (fourteen) days., Disp: , Rfl:    pyridoxine  (B-6) 100 MG tablet, Take 100 mg by mouth daily., Disp: , Rfl:    rivaroxaban  (XARELTO ) 20 MG TABS tablet, Take 1 tablet (20 mg total) by mouth daily with supper., Disp: 90 tablet, Rfl: 3   telmisartan (MICARDIS) 40 MG tablet, Take 40 mg by mouth daily., Disp: , Rfl:    tirzepatide  (MOUNJARO ) 10 MG/0.5ML Pen, Inject 10 mg into the skin once a week., Disp: 2 mL, Rfl: 3   topiramate  (TOPAMAX ) 50 MG tablet, 1 tablet (50 mg total) in the morning AND 2 tablets (100 mg total) every evening., Disp: 270 tablet, Rfl: 2   vitamin E 400 UNIT capsule, Take 400 Units by mouth daily., Disp: , Rfl:    apixaban  (ELIQUIS ) 5 MG TABS tablet, Take 1 tablet (5 mg total) by mouth 2 (two) times daily. (Patient not taking: Reported on 08/05/2023), Disp: 60 tablet, Rfl: 0   apixaban  (ELIQUIS ) 5 MG TABS  tablet, Take 1 tablet (5 mg total) by mouth 2 (two) times daily. (Patient not taking: Reported on 08/05/2023), Disp: 60 tablet, Rfl: 4   colestipol  (COLESTID ) 1 g tablet, Take 2 tablets (2 g total) by mouth at bedtime. (Patient not taking: Reported on 08/05/2023), Disp: 60 tablet, Rfl: 3   fluticasone  (FLONASE ) 50 MCG/ACT nasal spray, Place 2 sprays into both nostrils daily as directed, Disp: 48 g, Rfl: 3   furosemide  (LASIX ) 20 MG tablet, Take 2 tablets (40 mg total) by mouth daily until your weight is under 159lbs, then take 1 tablet daily., Disp: 180 tablet, Rfl: 3   gabapentin  (NEURONTIN ) 800 MG tablet, Take 1 tablet (800 mg total) by mouth 2 (two) times daily. (Patient taking differently: Take 800 mg by  mouth at bedtime. Additional 400 mg if needed in the middle of the night), Disp: 180 tablet, Rfl: 3   hyoscyamine  (LEVSIN  SL) 0.125 MG SL tablet, Place 1 tablet (0.125 mg total) under the tongue every 6 (six) hours as needed. (Patient not taking: Reported on 08/05/2023), Disp: 30 tablet, Rfl: 6   metoprolol  tartrate (LOPRESSOR ) 25 MG tablet, Take 1 tablet (25 mg total) by mouth 2 (two) times daily., Disp: 180 tablet, Rfl: 3   metoprolol  tartrate (LOPRESSOR ) 25 MG tablet, Take 1 tablet (25 mg total) by mouth 2 (two) times daily., Disp: 180 tablet, Rfl: 3   metoprolol  tartrate (LOPRESSOR ) 50 MG tablet, Take 1 tablet (50 mg total) by mouth 2 (two) times daily. (Patient not taking: Reported on 08/05/2023), Disp: 180 tablet, Rfl: 3   mometasone  (ASMANEX , 60 METERED DOSES,) 220 MCG/ACT inhaler, Inhale 1 puff into the lungs 2 (two) times daily., Disp: 3 each, Rfl: 1   MOUNJARO  5 MG/0.5ML Pen, Inject into the skin. (Patient not taking: Reported on 08/05/2023), Disp: , Rfl:    omeprazole (PRILOSEC) 20 MG capsule, Take 20 mg by mouth 2 (two) times daily before a meal., Disp: , Rfl: 3   Probiotic Product (PROBIOTIC PO), Take 1 capsule by mouth daily. (Patient not taking: Reported on 08/05/2023), Disp: , Rfl:     Semaglutide , 1 MG/DOSE, (OZEMPIC , 1 MG/DOSE,) 4 MG/3ML SOPN, Inject 1 mg into the skin once a week., Disp: 3 mL, Rfl: 6   tirzepatide  (MOUNJARO ) 2.5 MG/0.5ML Pen, Inject 2.5 mg into the skin once a week., Disp: , Rfl:    tirzepatide  (MOUNJARO ) 2.5 MG/0.5ML Pen, Inject 2.5 mg into the skin once a week., Disp: 2 mL, Rfl: 5   tirzepatide  (MOUNJARO ) 5 MG/0.5ML Pen, Inject 5 mg into the skin once a week., Disp: 2 mL, Rfl: 5   tiZANidine  (ZANAFLEX ) 4 MG tablet, Take 1 tablet (4 mg total) by mouth 2 (two) times daily as needed for muscle spasms. (Patient not taking: Reported on 08/05/2023), Disp: 30 tablet, Rfl: 0

## 2023-08-05 NOTE — Patient Instructions (Addendum)
 We will check inflammatory labs today  Based on these labs we will consider scheduling you for a bronchoscopy evaluation  Continue the Asmanex  inhaler per Dr. Maurilio  Start albuterol  duoneb nebulizer treatments twice daily followed by flutter valve therapy  We will order nebulizer machine and supplies  Follow up in 3 months, call sooner if needed

## 2023-08-06 ENCOUNTER — Other Ambulatory Visit: Payer: Self-pay

## 2023-08-06 ENCOUNTER — Other Ambulatory Visit (HOSPITAL_COMMUNITY): Payer: Self-pay

## 2023-08-06 ENCOUNTER — Encounter (HOSPITAL_COMMUNITY): Payer: Self-pay

## 2023-08-06 DIAGNOSIS — J479 Bronchiectasis, uncomplicated: Secondary | ICD-10-CM

## 2023-08-06 DIAGNOSIS — R9389 Abnormal findings on diagnostic imaging of other specified body structures: Secondary | ICD-10-CM

## 2023-08-07 ENCOUNTER — Ambulatory Visit: Payer: Medicare Other | Admitting: Pulmonary Disease

## 2023-08-08 ENCOUNTER — Encounter: Payer: Self-pay | Admitting: Pulmonary Disease

## 2023-08-11 DIAGNOSIS — M797 Fibromyalgia: Secondary | ICD-10-CM | POA: Diagnosis not present

## 2023-08-11 DIAGNOSIS — M1991 Primary osteoarthritis, unspecified site: Secondary | ICD-10-CM | POA: Diagnosis not present

## 2023-08-11 DIAGNOSIS — J479 Bronchiectasis, uncomplicated: Secondary | ICD-10-CM | POA: Diagnosis not present

## 2023-08-11 DIAGNOSIS — M3390 Dermatopolymyositis, unspecified, organ involvement unspecified: Secondary | ICD-10-CM | POA: Diagnosis not present

## 2023-08-11 DIAGNOSIS — Z6834 Body mass index (BMI) 34.0-34.9, adult: Secondary | ICD-10-CM | POA: Diagnosis not present

## 2023-08-11 DIAGNOSIS — E669 Obesity, unspecified: Secondary | ICD-10-CM | POA: Diagnosis not present

## 2023-08-11 DIAGNOSIS — M79642 Pain in left hand: Secondary | ICD-10-CM | POA: Diagnosis not present

## 2023-08-11 DIAGNOSIS — M25511 Pain in right shoulder: Secondary | ICD-10-CM | POA: Diagnosis not present

## 2023-08-11 DIAGNOSIS — M79641 Pain in right hand: Secondary | ICD-10-CM | POA: Diagnosis not present

## 2023-08-11 DIAGNOSIS — R768 Other specified abnormal immunological findings in serum: Secondary | ICD-10-CM | POA: Diagnosis not present

## 2023-08-11 DIAGNOSIS — M2559 Pain in other specified joint: Secondary | ICD-10-CM | POA: Diagnosis not present

## 2023-08-11 DIAGNOSIS — M1380 Other specified arthritis, unspecified site: Secondary | ICD-10-CM | POA: Diagnosis not present

## 2023-08-11 LAB — ANTI-DNA ANTIBODY, DOUBLE-STRANDED: ds DNA Ab: <1 [IU]/mL

## 2023-08-11 LAB — ANTI-SMITH ANTIBODY: ENA SM Ab Ser-aCnc: <1 AI

## 2023-08-11 LAB — ANTI-NUCLEAR AB-TITER (ANA TITER)
ANA TITER: 1:40 {titer} — ABNORMAL HIGH
ANA Titer 1: 1:40 {titer} — ABNORMAL HIGH

## 2023-08-11 LAB — SJOGREN'S SYNDROME ANTIBODS(SSA + SSB)
SSA (Ro) (ENA) Antibody, IgG: <1 AI
SSB (La) (ENA) Antibody, IgG: <1 AI

## 2023-08-11 LAB — CYCLIC CITRUL PEPTIDE ANTIBODY, IGG: Cyclic Citrullin Peptide Ab: <16 U

## 2023-08-11 LAB — CENTROMERE ANTIBODIES: Centromere Ab Screen: <1 AI

## 2023-08-11 LAB — RHEUMATOID FACTOR: Rheumatoid fact SerPl-aCnc: <10 [IU]/mL (ref <14)

## 2023-08-11 LAB — ANTI-SCLERODERMA ANTIBODY: Scleroderma (Scl-70) (ENA) Antibody, IgG: <1 AI

## 2023-08-11 LAB — ANA: Anti Nuclear Antibody (ANA): POSITIVE — AB

## 2023-08-11 LAB — ANCA SCREEN W REFLEX TITER: ANCA SCREEN: NEGATIVE

## 2023-08-11 LAB — ALDOLASE: Aldolase: 4.8 U/L (ref <=8.1)

## 2023-08-12 ENCOUNTER — Encounter: Payer: Self-pay | Admitting: Pulmonary Disease

## 2023-08-12 DIAGNOSIS — D1801 Hemangioma of skin and subcutaneous tissue: Secondary | ICD-10-CM | POA: Diagnosis not present

## 2023-08-12 DIAGNOSIS — L650 Telogen effluvium: Secondary | ICD-10-CM | POA: Diagnosis not present

## 2023-08-12 DIAGNOSIS — L821 Other seborrheic keratosis: Secondary | ICD-10-CM | POA: Diagnosis not present

## 2023-08-12 DIAGNOSIS — L812 Freckles: Secondary | ICD-10-CM | POA: Diagnosis not present

## 2023-08-12 DIAGNOSIS — I788 Other diseases of capillaries: Secondary | ICD-10-CM | POA: Diagnosis not present

## 2023-08-12 NOTE — Progress Notes (Signed)
   Interstitial Lung Disease Multidisciplinary Conference   Amber Bennett    MRN [id number]    DOB 12-19-1950  Primary Care Physician:Perini, Loraine Leriche, MD  Referring Physician:   Time of Conference: 7.30am- 8.30am Date of conference: 08/12/2023 Location of Conference: -  Virtual  Participating Pulmonary: Dr. Kalman Shan, MD,  Dr Chilton Greathouse, MD  Pathology: Dr Holley Bouche, MD  Radiology: Dr Trudie Reed MD Others:   Brief History:  Progressive dyspnea, abnormal HRCT, joint pains Has been evaluated by Dr. Dierdre Forth, Rheumatology with no clear evidence of connective tissue disease.   PFT     No data to display          MDD discussion of CT scan   CT high res 1/302025 Air trapping with peribronchovascular nodular infiltrates predominant in the right upper lobe.   MDD Impression/Recs:  Alternate pattern. No clear evidence of ILD Looks like infection and inflammatory etiology  Time Spent in preparation and discussion:  > 30 min   SIGNATURE   Chilton Greathouse MD [facility] See Amion for pager  If no response to pager , please call [phone] until 7pm After 7:00 pm call Elink  [phone] 08/12/2023, 7:36 AM

## 2023-08-13 ENCOUNTER — Telehealth: Payer: Self-pay | Admitting: Pulmonary Disease

## 2023-08-13 DIAGNOSIS — M25512 Pain in left shoulder: Secondary | ICD-10-CM | POA: Diagnosis not present

## 2023-08-13 NOTE — Telephone Encounter (Signed)
Dr. Dierdre Forth is calling from [facility] Rheumat[facility] @ [phone] asking Dr. Francine Graven to ret his call. I did do a Epic message to Dr. Francine Graven. He said it was not urgent. NFN

## 2023-08-15 ENCOUNTER — Other Ambulatory Visit (HOSPITAL_COMMUNITY): Payer: Self-pay

## 2023-08-20 ENCOUNTER — Institutional Professional Consult (permissible substitution): Payer: Medicare Other | Admitting: Pulmonary Disease

## 2023-08-20 ENCOUNTER — Telehealth: Payer: Self-pay | Admitting: Pulmonary Disease

## 2023-08-20 DIAGNOSIS — J479 Bronchiectasis, uncomplicated: Secondary | ICD-10-CM

## 2023-08-20 DIAGNOSIS — R1310 Dysphagia, unspecified: Secondary | ICD-10-CM

## 2023-08-20 NOTE — Telephone Encounter (Signed)
She is having issues with runny nose despite using nasal sprays: flonase, ryaltris and ipratropium.  She has not noted much improvement in her breathing since being on Qvar.   I reviewed her lab results and that the ANA is only mildly elevated and other results are negative.   We will schedule her for modified barium swallow given history of dysphagia.  Once we have these results we will consider bronchoscopy.  Melody Comas, MD [facility] Office: [phone]   See Amion for personal pager PCCM on call pager [phone] until 7pm. Please call Elink 7p-7a. [phone]

## 2023-08-21 ENCOUNTER — Other Ambulatory Visit (HOSPITAL_COMMUNITY): Payer: Self-pay | Admitting: Pulmonary Disease

## 2023-08-21 ENCOUNTER — Telehealth (HOSPITAL_COMMUNITY): Payer: Self-pay | Admitting: *Deleted

## 2023-08-21 DIAGNOSIS — R1310 Dysphagia, unspecified: Secondary | ICD-10-CM

## 2023-08-21 DIAGNOSIS — R059 Cough, unspecified: Secondary | ICD-10-CM

## 2023-08-21 NOTE — Telephone Encounter (Signed)
Attempted to contact patient to schedule OP MBS. Left VM @ [phone]. RKEEL

## 2023-08-26 ENCOUNTER — Other Ambulatory Visit: Payer: Self-pay

## 2023-08-26 ENCOUNTER — Encounter: Payer: Self-pay | Admitting: Allergy and Immunology

## 2023-08-26 ENCOUNTER — Ambulatory Visit (INDEPENDENT_AMBULATORY_CARE_PROVIDER_SITE_OTHER): Payer: Medicare Other | Admitting: Allergy and Immunology

## 2023-08-26 VITALS — BP 118/68 | HR 61 | Temp 98.1°F | Resp 18 | Wt 163.9 lb

## 2023-08-26 DIAGNOSIS — R0609 Other forms of dyspnea: Secondary | ICD-10-CM | POA: Diagnosis not present

## 2023-08-26 DIAGNOSIS — J3089 Other allergic rhinitis: Secondary | ICD-10-CM

## 2023-08-26 MED ORDER — RYALTRIS 665-25 MCG/ACT NA SUSP: 2.0000 | Freq: Two times a day (BID) | NASAL | 1 refills | Status: DC

## 2023-08-26 MED ORDER — LORATADINE 10 MG PO TABS: 10.0000 mg | ORAL_TABLET | Freq: Every day | ORAL | 1 refills | Status: AC | PRN

## 2023-08-26 NOTE — Patient Instructions (Addendum)
  1. Treat inflammation of airway:   A. Ryaltris - 2 sprays each nostril 2 times per day  2. If needed:   A. Loratadine 10 mg - 1 tablet 1 time per day  3. Discontinue Asmanex and montelukast   4. Influenza = Tamiflu. Covid = molnupiravir  5. Tylenol challenge???  6. Follow up with Dr. Francine Graven about swallow test, bronchoscopy

## 2023-08-26 NOTE — Progress Notes (Unsigned)
 [place] - [place] - [place] - [place]   Follow-up Note  Referring Provider: Rodrigo Ran, MD Primary Provider: Rodrigo Ran, MD Date of Office Visit: 08/26/2023  Subjective:   Amber Bennett (DOB: [birth year]) is a 73 y.o. female [facility] in re-evaluation of the following:  HPI: Amber Bennett returns to this clinic in evaluation of shortness of breath.  I last saw her in this clinic during her initial evaluation of 22 July 2023.  When we initially saw her in this clinic we empirically treated her with anti-inflammatory agents for her airway and this has not helped her at all.  In investigation of her shortness of breath and dyspnea on exertion we obtained a high-resolution chest CT scan which identified some parenchymal abnormalities for which referred her to pulmonology, Dr. Francine Graven, who is going to be proceeding with a swallow test and a bronchoscopy.  The use of her nasal spray has resulted in significant improvement regarding her sneezing and irritation of her nose.  She still has problems with lots of watery eyes if she is exposed to cats and dogs and what sounds like [place] grass on soccer turf.  Allergies as of 08/26/2023       Reactions   Tylenol [acetaminophen] Swelling, Other (See Comments)   Throat swelling, anaphylaxis   Aspirin Other (See Comments)   gi bleeding in 2005   Cefdinir Other (See Comments)   Other Reaction(s): hives, itching   Colesevelam Other (See Comments)   Other Reaction(s): has never tried it but would not be a good choice as all seizure meds would have to be taken at least four hours prior to the dose and with her regimen that just won't be possible.   Ezetimibe-simvastatin Other (See Comments)   Other Reaction(s): elevated LFT   Penicillin G Benzathine Itching   Rosuvastatin    Other Reaction(s): skin rash and myalgia   Wound Dressing Adhesive Other (See Comments)   Latex  Itching   Rash        Medication List    amLODipine 2.5 MG tablet Commonly known as: NORVASC Take 1 tablet (2.5 mg total) by mouth at bedtime.   CALTRATE 600+D3 PO Take 1 tablet by mouth daily.   CO Q 10 PO Take 200 mg by mouth daily.   cyclobenzaprine 5 MG tablet Commonly known as: FLEXERIL Take 1 tablet (5 mg total) by mouth every 8 (eight) hours as needed.   diphenoxylate-atropine 2.5-0.025 MG tablet Commonly known as: LOMOTIL TAKE 1 TABLET BY MOUTH EVERY 4 HOURS AS NEEDED FOR DIARRHEA OR LOOSE STOOLS.   DULoxetine 30 MG capsule Commonly known as: CYMBALTA Take 30 mg by mouth every evening.   DULoxetine 30 MG capsule Commonly known as: CYMBALTA Take 1 capsule (30 mg total) by mouth every evening.   ezetimibe 10 MG tablet Commonly known as: ZETIA Take 10 mg by mouth every evening.   fluticasone 50 MCG/ACT nasal spray Commonly known as: FLONASE Place 2 sprays into both nostrils daily.   fluticasone 50 MCG/ACT nasal spray Commonly known as: FLONASE Place 2 sprays into both nostrils daily as directed   furosemide 20 MG tablet Commonly known as: LASIX Take 2 tablets TWICE DAILY until your weight is less than 159 pounds.  Once your weight is less than 159 pounds, take only 1 tablet daily   gabapentin 800 MG tablet Commonly known as: NEURONTIN Take 1 tablet (800 mg total) by mouth 2 (  two) times daily.   hydroxychloroquine 200 MG tablet Commonly known as: PLAQUENIL Take 1 tablet (200 mg total) by mouth daily with food or milk.   ibandronate 150 MG tablet Commonly known as: BONIVA Take 150 mg by mouth every 30 (thirty) days.   ipratropium 0.03 % nasal spray Commonly known as: ATROVENT Place 2 sprays into both nostrils every 12 (twelve) hours.   ipratropium-albuterol 0.5-2.5 (3) MG/3ML Soln Commonly known as: DUONEB Take 3 mLs by nebulization 3 (three) times daily as needed.   loratadine 10 MG tablet Commonly known as: CLARITIN Take 1 tablet (10 mg  total) by mouth daily as needed for allergies (Can take an extra dose during flare ups.).   metoprolol tartrate 50 MG tablet Commonly known as: LOPRESSOR Take 1 tablet (50 mg total) by mouth 2 (two) times daily.   metoprolol tartrate 50 MG tablet Commonly known as: LOPRESSOR Take 1 tablet (50 mg total) by mouth 2 (two) times daily.   mometasone 220 MCG/ACT inhaler Commonly known as: ASMANEX Inhale 1 puff into the lungs 2 (two) times daily.   montelukast 10 MG tablet Commonly known as: SINGULAIR Take 10 mg by mouth at bedtime.   montelukast 10 MG tablet Commonly known as: SINGULAIR Take 1 tablet (10 mg total) by mouth daily.   Mounjaro 10 MG/0.5ML Pen Generic drug: tirzepatide Inject 10 mg into the skin once a week.   MULTIVITAMIN PO Take 1 tablet by mouth daily.   omega-3 acid ethyl esters 1 g capsule Commonly known as: LOVAZA Take 2 g by mouth 2 (two) times daily.   omega-3 acid ethyl esters 1 g capsule Commonly known as: LOVAZA Take 2 capsules (2 g total) by mouth 2 (two) times daily.   omeprazole 20 MG capsule Commonly known as: PRILOSEC Take 20 mg by mouth 2 (two) times daily before a meal.   PHENobarbital 97.2 MG tablet Commonly known as: LUMINAL TAKE 1 AND 1/2 TABLETS BY MOUTH EVERY DAY   potassium chloride SA 20 MEQ tablet Commonly known as: KLOR-CON M While taking 40 mg of Furosemide, take Potassium Chloride 20 meq twice a day. When you decrease Furosemide to 20 mg, decrease Potassium Chloride to 20 meq once a day.   Praluent 150 MG/ML Soaj Generic drug: Alirocumab Inject 150 mg into the skin every 14 (fourteen) days.   Praluent 150 MG/ML Soaj Generic drug: Alirocumab Inject 1 mL (150 mg total) into the skin every 14 (fourteen) days.   PRESERVISION AREDS PO Take 1 capsule by mouth daily.   pyridoxine 100 MG tablet Commonly known as: B-6 Take 100 mg by mouth daily.   Qvar RediHaler 80 MCG/ACT inhaler Generic drug: beclomethasone Inhale 1 puff  into the lungs 2 (two) times daily.   rivaroxaban 20 MG Tabs tablet Commonly known as: XARELTO Take 1 tablet (20 mg total) by mouth daily with supper.   Ryaltris 409-81 MCG/ACT Susp Generic drug: Olopatadine-Mometasone Place 2 sprays into the nose 2 (two) times daily.   telmisartan 40 MG tablet Commonly known as: MICARDIS Take 40 mg by mouth daily.   topiramate 50 MG tablet Commonly known as: TOPAMAX 1 tablet (50 mg total) in the morning AND 2 tablets (100 mg total) every evening.   vitamin C 1000 MG tablet Take 1,000 mg by mouth daily. Airborne emergen c   Vitamin D 50 MCG (2000 UT) tablet Take 2,000 Units by mouth daily.   vitamin E 180 MG (400 UNITS) capsule Take 400 Units by mouth daily.  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   Carpal tunnel syndrome    Cervical radiculitis    Collar bone fracture    Depression    Dermatomyositis (HCC)    Diabetes mellitus without complication (HCC)    Elevated liver enzymes    External hemorrhoids    Fatty liver    Fibromyalgia affecting hand 08/2022   bilateral wrist and hands   Hematuria    negative evaluation   High cholesterol    HTN (hypertension)    Internal hemorrhoids    Laryngitis    Lower back pain    Macular degeneration    Osteopenia    Left side hip   Seizures (HCC)    09/07/2019 pt reports last seizure in 2014   Tubular adenoma of colon     Past Surgical History:  Procedure Laterality Date   ABDOMINAL HYSTERECTOMY  1982   ADENOIDECTOMY     APPENDECTOMY     ATRIAL FIBRILLATION ABLATION N/A 03/13/2023   Procedure: ATRIAL FIBRILLATION ABLATION;  Surgeon: Regan Lemming, MD;  Location: [facility];  Service: Cardiovascular;  Laterality: N/A;   BACK SURGERY     BACK SURGERY  07/2015   CARPAL TUNNEL RELEASE     CATARACT EXTRACTION, BILATERAL     CHOLECYSTECTOMY  2003   COLONOSCOPY     EXPLORATORY  LAPAROTOMY     LAMINECTOMY     SHOULDER SURGERY Right 2000   SINOSCOPY     TONSILLECTOMY     TOTAL HIP ARTHROPLASTY Right 02/12/2022   Procedure: RIGHT TOTAL HIP ARTHROPLASTY ANTERIOR APPROACH;  Surgeon: Kathryne Hitch, MD;  Location: [facility] OR;  Service: Orthopedics;  Laterality: Right;    Review of systems negative except as noted in HPI / PMHx or noted below:  Review of Systems  Constitutional: Negative.   HENT: Negative.    Eyes: Negative.   Respiratory: Negative.    Cardiovascular: Negative.   Gastrointestinal: Negative.   Genitourinary: Negative.   Musculoskeletal: Negative.   Skin: Negative.   Neurological: Negative.   Endo/Heme/Allergies: Negative.   Psychiatric/Behavioral: Negative.       Objective:   Vitals:   08/26/23 1414  BP: 118/68  Pulse: 61  Resp: 18  Temp: 98.1 F (36.7 C)  SpO2: 96%      Weight: 163 lb 14.4 oz (74.3 kg)   Physical Exam Constitutional:      Appearance: She is not diaphoretic.  HENT:     Head: Normocephalic.     Right Ear: Tympanic membrane, ear canal and external ear normal.     Left Ear: Tympanic membrane, ear canal and external ear normal.     Nose: Nose normal. No mucosal edema or rhinorrhea.     Mouth/Throat:     Pharynx: Uvula midline. No oropharyngeal exudate.  Eyes:     Conjunctiva/sclera: Conjunctivae normal.  Neck:     Thyroid: No thyromegaly.     Trachea: Trachea normal. No tracheal tenderness or tracheal deviation.  Cardiovascular:     Rate and Rhythm: Normal rate and regular rhythm.     Heart sounds: Normal heart sounds, S1 normal and S2 normal. No murmur heard. Pulmonary:     Effort: No respiratory distress.     Breath sounds: Normal breath sounds. No stridor. No wheezing or rales.  Lymphadenopathy:     Head:     Right side of  head: No tonsillar adenopathy.     Left side of head: No tonsillar adenopathy.     Cervical: No cervical adenopathy.  Skin:    Findings: No erythema or rash.     Nails:  There is no clubbing.  Neurological:     Mental Status: She is alert.     Diagnostics:   Results of a high-resolution chest CT scan obtained 31 July 2023 identified the following:  Cardiovascular: Aortic atherosclerosis. Normal heart size. No pericardial effusion.   Mediastinum/Nodes: No enlarged mediastinal, hilar, or axillary lymph nodes. Thyroid gland, trachea, and esophagus demonstrate no significant findings.   Lungs/Pleura: Mild, bland appearing scarring of the lung bases. No significant air trapping on expiratory phase imaging. New clustered heterogeneous airspace opacity and nodularity in the inferior right upper lobe measuring 2.4 x 1.6 cm (series 8, image 58). Additional tiny nodules and small consolidations in the medial left upper lobe (series 8, image 48) and in the right middle lobe (series 8, image 80). No pleural effusion or pneumothorax.  Results of blood tests obtained 22 July 2023 identified IgE 39 KU/L, no antigen specific IgE antibodies on an area two aero allergen IgE profile, WBC 5.9, absolute eosinophil 100, absolute lymphocyte 1600, hemoglobin 13.0, platelet 163.  Assessment and Plan:   1. Dyspnea on exertion   2. Perennial allergic rhinitis    1. Treat inflammation of airway:   A. Ryaltris - 2 sprays each nostril 2 times per day  2. If needed:   A. Loratadine 10 mg - 1 tablet 1 time per day  3. Discontinue Asmanex and montelukast   4. Influenza = Tamiflu. Covid = molnupiravir  5. Tylenol challenge???  6. Follow up with Dr. Francine Graven about swallow test, bronchoscopy   Madalynne has not obtained any benefit from using anti-inflammatory agents for her airway in the form of an inhaled steroid and a leukotriene modifier and we will have her discontinue both of those agents today.  She did obtain benefit regarding her upper airway disease while using a combination nasal steroid/nasal antihistamine and she can continue on that agent.  We are going  to defer any further evaluation regarding her respiratory tract issues to pulmonology, Dr. Francine Graven.  Laurette Schimke, MD Allergy / Immunology [facility]

## 2023-08-27 ENCOUNTER — Encounter: Payer: Self-pay | Admitting: Pulmonary Disease

## 2023-08-27 ENCOUNTER — Encounter: Payer: Self-pay | Admitting: Allergy and Immunology

## 2023-08-27 ENCOUNTER — Telehealth: Payer: Self-pay | Admitting: Pulmonary Disease

## 2023-08-27 ENCOUNTER — Ambulatory Visit: Payer: Medicare Other | Admitting: Pulmonary Disease

## 2023-08-27 VITALS — BP 120/84 | HR 68 | Ht 58.5 in | Wt 161.0 lb

## 2023-08-27 DIAGNOSIS — G4733 Obstructive sleep apnea (adult) (pediatric): Secondary | ICD-10-CM | POA: Diagnosis not present

## 2023-08-27 DIAGNOSIS — D6869 Other thrombophilia: Secondary | ICD-10-CM

## 2023-08-27 DIAGNOSIS — I10 Essential (primary) hypertension: Secondary | ICD-10-CM

## 2023-08-27 DIAGNOSIS — I4819 Other persistent atrial fibrillation: Secondary | ICD-10-CM | POA: Diagnosis not present

## 2023-08-27 DIAGNOSIS — I480 Paroxysmal atrial fibrillation: Secondary | ICD-10-CM | POA: Diagnosis not present

## 2023-08-27 MED ORDER — FUROSEMIDE 20 MG PO TABS: ORAL_TABLET | ORAL | 2 refills | Status: DC

## 2023-08-27 NOTE — Progress Notes (Signed)
 Electrophysiology Office Note:   Date:  08/27/2023  ID:  Amber Bennett, DOB [birth year], MRN [id number]  Primary Cardiologist: Thurmon Fair, MD Primary Heart Failure: None Electrophysiologist: Will Jorja Loa, MD      History of Present Illness:   Amber Bennett is a 73 y.o. female with h/o AF, dCHF, HTN, HLD, obesity, OSA seen today for routine electrophysiology follow up post ablation. Son is PCP in area.   Since last being seen in our clinic the patient reports she has been doing well. She has not noted further AF since her ablation.  She reports she had to go off wegovy & started gaining weight. She reports she largely eats at home.  Does drink 1-2 glasses of wine per evening.  Reports she can not walk due to back / hip pain. Does go to the [facility] to do water aerobics. She denies chest pain, palpitations, dyspnea, PND, orthopnea, nausea, vomiting, dizziness, syncope, edema, weight gain, or early satiety.   Review of systems complete and found to be negative unless listed in HPI.   EP Information / Studies Reviewed:    EKG is ordered today. Personal review as below.  EKG Interpretation Date/Time:  Wednesday August 27 2023 15:22:53 EST Ventricular Rate:  68 PR Interval:  218 QRS Duration:  76 QT Interval:  424 QTC Calculation: 450 R Axis:   87  Text Interpretation: Sinus rhythm with 1st degree A-V block Confirmed by Canary Brim ([id number]) on 08/27/2023 3:37:47 PM   Studies:  ECHO 12/2022 > LVEF 60-65%, no RWMA, LA mildly dilated Cardiac Morphology 03/2023 > normal pulmonary vein drainage into the LA, PFO, CCS 0    Arrhythmia / AAD AF  Multaq > breakthrough AF  Amiodarone     Risk Assessment/Calculations:    CHA2DS2-VASc Score = 4   This indicates a 4.8% annual risk of stroke. The patient's score is based upon: CHF History: 1 HTN History: 1 Diabetes History: 0 Stroke History: 0 Vascular Disease History: 0 Age Score: 1 Gender Score: 1             Physical  Exam:   VS:  BP 120/84 (BP Location: Left Arm, Patient Position: Sitting, Cuff Size: Normal)   Pulse 68   Ht 4' 10.5" (1.486 m)   Wt 161 lb (73 kg)   LMP 07/01/1980   SpO2 96%   BMI 33.08 kg/m    Wt Readings from Last 3 Encounters:  08/27/23 161 lb (73 kg)  08/26/23 163 lb 14.4 oz (74.3 kg)  08/05/23 161 lb 3.2 oz (73.1 kg)     GEN: Well nourished, well developed in no acute distress NECK: No JVD; No carotid bruits CARDIAC: Regular rate and rhythm, no murmurs, rubs, gallops RESPIRATORY:  Clear to auscultation without rales, wheezing or rhonchi  ABDOMEN: Soft, non-tender, non-distended EXTREMITIES:  No edema; No deformity   ASSESSMENT AND PLAN:    Persistent Atrial Fibrillation  CHA2DS2-VASc 4, s/p ablation 03/2023. Failed multaq due to breakthrough, continued to have on amio as well.  -continue metoprolol 50 mg BID   -stop amiodarone   -OAC for stroke prophylaxis  -no symptom burden since ablation  -monitors with an Apple Watch -discussed modification of risk factors   Secondary Hypercoagulable State  -continue Xarelto, dose reviewed and appropriate by CrCl 52mL/min  HFpEF  -euvolemic on exam   Hypertension  -well controlled on current regimen    OSA  -CPAP compliance encouraged     Follow up with Dr. Elberta Fortis or  B.Veleta Miners, NP in 6 months  Signed, Canary Brim, NP-C, AGACNP-BC [facility] - Electrophysiology  08/27/2023, 4:45 PM

## 2023-08-27 NOTE — Telephone Encounter (Signed)
*  STAT* If patient is at the pharmacy, call can be transferred to refill team.   1. Which medications need to be refilled? (please list name of each medication and dose if known)   furosemide (LASIX) 20 MG tablet    2. Which pharmacy/location (including street and city if local pharmacy) is medication to be sent to? [id number] [place], [place] - [address] AT [facility] [address] [facility] [address] Phone: [phone]  Fax: [phone]     3. Do they need a 30 day or 90 day supply? 90

## 2023-08-27 NOTE — Patient Instructions (Signed)
 Medication Instructions:  STOP amiodarone *If you need a refill on your cardiac medications before your next appointment, please call your pharmacy*  Lab Work: None  If you have labs (blood work) drawn today and your tests are completely normal, you will receive your results only by: MyChart Message (if you have MyChart) OR A paper copy in the mail If you have any lab test that is abnormal or we need to change your treatment, we will call you to review the results.  Follow-Up: At [facility], you and your health needs are our priority.  As part of our continuing mission to provide you with exceptional heart care, we have created designated Provider Care Teams.  These Care Teams include your primary Cardiologist (physician) and Advanced Practice Providers (APPs -  Physician Assistants and Nurse Practitioners) who all work together to provide you with the care you need, when you need it.  Your next appointment:   6 month(s)  Provider:   Canary Brim, NP    Other Instructions STOP amiodarone

## 2023-08-28 DIAGNOSIS — H04123 Dry eye syndrome of bilateral lacrimal glands: Secondary | ICD-10-CM | POA: Diagnosis not present

## 2023-08-28 DIAGNOSIS — E119 Type 2 diabetes mellitus without complications: Secondary | ICD-10-CM | POA: Diagnosis not present

## 2023-08-28 DIAGNOSIS — H52201 Unspecified astigmatism, right eye: Secondary | ICD-10-CM | POA: Diagnosis not present

## 2023-08-28 DIAGNOSIS — H353132 Nonexudative age-related macular degeneration, bilateral, intermediate dry stage: Secondary | ICD-10-CM | POA: Diagnosis not present

## 2023-08-28 DIAGNOSIS — Z961 Presence of intraocular lens: Secondary | ICD-10-CM | POA: Diagnosis not present

## 2023-08-28 LAB — MYOMARKER 3 PLUS PROFILE (RDL)

## 2023-08-28 LAB — HYPERSENSITIVITY PNEUMONITIS
A. Pullulans Abs: NEGATIVE
A.Fumigatus #1 Abs: NEGATIVE
Micropolyspora faeni, IgG: NEGATIVE
Pigeon Serum Abs: NEGATIVE
Thermoact. Saccharii: NEGATIVE
Thermoactinomyces vulgaris, IgG: NEGATIVE

## 2023-08-28 LAB — RNP ANTIBODIES: ENA RNP Ab: <0.2 AI (ref 0.0–0.9)

## 2023-09-01 ENCOUNTER — Other Ambulatory Visit

## 2023-09-01 ENCOUNTER — Telehealth: Payer: Self-pay | Admitting: Internal Medicine

## 2023-09-01 DIAGNOSIS — R197 Diarrhea, unspecified: Secondary | ICD-10-CM

## 2023-09-01 NOTE — Telephone Encounter (Signed)
 C diff PCR -- send stat please JMP

## 2023-09-01 NOTE — Telephone Encounter (Signed)
 Patient is requesting to speak with a nurse stating she is having change in bowel movement. Please advise.

## 2023-09-01 NOTE — Telephone Encounter (Signed)
 Order has been entered  The pt has been advised and will pick up the stool kit this afternoon

## 2023-09-01 NOTE — Telephone Encounter (Signed)
 Pt developed diarrhea with very strong odor last week.  She has 4-5 watery diarrhea episodes with mucous.  She has been taking colestipol 4 times daily with only little relief.  She states she has a history of C diff and her symptoms are the same as they were with the C diff.  Do you want to order stool studies? Please advise

## 2023-09-03 ENCOUNTER — Other Ambulatory Visit

## 2023-09-03 DIAGNOSIS — R197 Diarrhea, unspecified: Secondary | ICD-10-CM

## 2023-09-04 ENCOUNTER — Ambulatory Visit (HOSPITAL_COMMUNITY)
Admission: RE | Admit: 2023-09-04 | Discharge: 2023-09-04 | Disposition: A | Discharge status: Home / Self Care | Payer: Medicare Other | Source: Ambulatory Visit | Attending: Internal Medicine | Admitting: Internal Medicine

## 2023-09-04 ENCOUNTER — Ambulatory Visit (HOSPITAL_COMMUNITY)
Admission: RE | Admit: 2023-09-04 | Discharge: 2023-09-04 | Disposition: A | Discharge status: Home / Self Care | Source: Ambulatory Visit | Attending: Pulmonary Disease | Admitting: Pulmonary Disease

## 2023-09-04 DIAGNOSIS — R1310 Dysphagia, unspecified: Secondary | ICD-10-CM

## 2023-09-04 DIAGNOSIS — M3313 Other dermatomyositis without myopathy: Secondary | ICD-10-CM | POA: Diagnosis not present

## 2023-09-04 DIAGNOSIS — E119 Type 2 diabetes mellitus without complications: Secondary | ICD-10-CM | POA: Diagnosis not present

## 2023-09-04 DIAGNOSIS — J479 Bronchiectasis, uncomplicated: Secondary | ICD-10-CM | POA: Insufficient documentation

## 2023-09-04 DIAGNOSIS — I4891 Unspecified atrial fibrillation: Secondary | ICD-10-CM | POA: Diagnosis not present

## 2023-09-04 DIAGNOSIS — R059 Cough, unspecified: Secondary | ICD-10-CM | POA: Diagnosis not present

## 2023-09-04 DIAGNOSIS — R0602 Shortness of breath: Secondary | ICD-10-CM | POA: Insufficient documentation

## 2023-09-04 DIAGNOSIS — I10 Essential (primary) hypertension: Secondary | ICD-10-CM | POA: Insufficient documentation

## 2023-09-04 LAB — PULMONARY FUNCTION TEST
DL/VA % pred: 94 %
DL/VA: 4.04 ml/min/mmHg/L
DLCO unc % pred: 71 %
DLCO unc: 11.31 ml/min/mmHg
FEF 25-75 Post: 1.68 L/s
FEF 25-75 Pre: 1.59 L/s
FEF2575-%Change-Post: 5 %
FEF2575-%Pred-Post: 124 %
FEF2575-%Pred-Pre: 117 %
FEV1-%Change-Post: 1 %
FEV1-%Pred-Post: 84 %
FEV1-%Pred-Pre: 83 %
FEV1-Post: 1.36 L
FEV1-Pre: 1.34 L
FEV1FVC-%Change-Post: 6 %
FEV1FVC-%Pred-Pre: 111 %
FEV6-%Change-Post: -4 %
FEV6-%Pred-Post: 75 %
FEV6-%Pred-Pre: 78 %
FEV6-Post: 1.54 L
FEV6-Pre: 1.6 L
FEV6FVC-%Pred-Post: 105 %
FEV6FVC-%Pred-Pre: 105 %
FVC-%Change-Post: -4 %
FVC-%Pred-Post: 70 %
FVC-%Pred-Pre: 73 %
FVC-Post: 1.54 L
FVC-Pre: 1.6 L
Post FEV1/FVC ratio: 88 %
Post FEV6/FVC ratio: 100 %
Pre FEV1/FVC ratio: 83 %
Pre FEV6/FVC Ratio: 100 %
RV % pred: 64 %
RV: 1.31 L
TLC % pred: 78 %
TLC: 3.31 L

## 2023-09-04 LAB — CLOSTRIDIUM DIFFICILE BY PCR: Toxigenic C. Difficile by PCR: NEGATIVE

## 2023-09-04 MED ORDER — ALBUTEROL SULFATE (2.5 MG/3ML) 0.083% IN NEBU
2.5000 mg | INHALATION_SOLUTION | Freq: Once | RESPIRATORY_TRACT | Status: AC
  Administered 2023-09-04: 2.5 mg via RESPIRATORY_TRACT

## 2023-09-04 NOTE — Telephone Encounter (Signed)
 The pt has been that the testing has not returned and we will speak with her as soon as results are received.

## 2023-09-04 NOTE — Telephone Encounter (Signed)
 Dr Rhea Belton pt will send to [facility]

## 2023-09-04 NOTE — Therapy (Signed)
 Modified Barium Swallow Study  Patient Details  Name: GEARLDEAN LOMANTO MRN: [id number] Date of Birth: Nov 01, 1950  Today's Date: 09/04/2023  Modified Barium Swallow completed.  Full report located under Chart Review in the Imaging Section.  History of Present Illness 73 year old woman, former smoker with history of dermatomyositis, DMII, atrial fibrillation s/p ablation and hypertension who is followed by pulmonary clinic for shortness of breath dx with mild bronchiectasis. EGD with dilation completed 08/19/2022 with GI, also rx PPI. Seen by ENT/ST Jan 2024 demonstrating mild vocal fold atrophy. Completed short course of ST to address functional voice disorder. Today, pt reports sensation of "sticking" at area of suprasternal notch, whcih is the worst with chicken but can occur with other solids too. Endorses coughing with intake, using liquid wash to clear.   Clinical Impression Ms. Mcbrayer presents for MBSS to assess swallow function and safety. She presents with functional oropharyngeal swallow with safety intact. She accepted a variety of liquid and solid textures during study. Oral phase was adequate with timely and efficient mastication and a/p transit of boluses. Mildly reduced base of tongue retraction results in oral residue which pt propels posteriorly into pharynx but requires cues for second swallow to clear.  Pt initiates pharyngeal swallow at level of pyriform sinuses. Mild impairment in laryngeal elevation noted, which allows for trace and transient penetration of thin and mildly thick liquids early in the swallow. Intact laryngeal vestibule closure and anterior hyoid excursion with full epiglottic inversion noted. Esophageal sweep completed. Esophagus appears slow to clear, with some retrograde flow noted. Material does eventually fully clear the esophagus with no need for additional bolus to aid in clearance. Suspect that pt's current complaints of globus is related to potential esophageal  issues. Pt may benefit from esophageal assessment if symptoms do not respond to strategies or symptoms worsen. Pt presents with moderate dysphonia this date, c/b hoarseness. Pt states she did have improvement from prior ST course but taught strategies have been ineffective in achieving clear vocal quality. SLP provided pt education regarding reflux precautions, strategies including positioning and dietary modifications to address potential esophageal clearance issues. Recommend pt follow in OP clinic to address voice. Would benefit from repeat ENT evaluation with laryngologist to assess laryngeal function.  Factors that may increase risk of adverse event in presence of aspiration Rubye Oaks & Clearance Coots 2021):  (none noted)  Swallow Evaluation Recommendations Recommendations: PO diet PO Diet Recommendation: Regular;Thin liquids (Level 0) Liquid Administration via: Cup;Straw Medication Administration: Whole meds with liquid Supervision: Patient able to self-feed Postural changes: Position pt fully upright for meals;Stay upright 30-60 min after meals Oral care recommendations: Oral care BID (2x/day) Recommended consults: Consider ENT consultation;Consider esophageal assessment      Maia Breslow 09/04/2023,3:15 PM

## 2023-09-04 NOTE — Telephone Encounter (Signed)
 Patient called and stated that she needs to speak to the nurse regarding her stool sample. Patient is requesting a call back. Please advise.

## 2023-09-05 ENCOUNTER — Other Ambulatory Visit: Payer: Self-pay | Admitting: Physician Assistant

## 2023-09-08 ENCOUNTER — Telehealth: Payer: Self-pay | Admitting: Cardiovascular Disease

## 2023-09-08 ENCOUNTER — Other Ambulatory Visit: Payer: Self-pay | Admitting: Internal Medicine

## 2023-09-08 ENCOUNTER — Other Ambulatory Visit: Payer: Self-pay | Admitting: *Deleted

## 2023-09-08 MED ORDER — COLESTIPOL HCL 1 G PO TABS: 2.0000 g | ORAL_TABLET | Freq: Every day | ORAL | 0 refills | Status: DC

## 2023-09-08 MED ORDER — METRONIDAZOLE 250 MG PO TABS
250.0000 mg | ORAL_TABLET | Freq: Three times a day (TID) | ORAL | 0 refills | Status: AC
Start: 2023-09-08 — End: 2023-09-15

## 2023-09-08 MED ORDER — DIPHENOXYLATE-ATROPINE 2.5-0.025 MG PO TABS: ORAL_TABLET | ORAL | 1 refills | Status: AC

## 2023-09-08 MED ORDER — CIPROFLOXACIN HCL 500 MG PO TABS: 500.0000 mg | ORAL_TABLET | Freq: Two times a day (BID) | ORAL | 0 refills | Status: AC

## 2023-09-08 NOTE — Telephone Encounter (Signed)
 Pt c/o medication issue:  1. Name of Medication:    2. How are you currently taking this medication (dosage and times per day)? Take 5 mg by mouth 2 (two) times daily.   3. Are you having a reaction (difficulty breathing--STAT)? No  4. What is your medication issue? Patient would like to know if she should continue taking the medication. Please advise.

## 2023-09-09 MED ORDER — RIVAROXABAN 20 MG PO TABS: 20.0000 mg | ORAL_TABLET | Freq: Every day | ORAL | 1 refills | Status: DC

## 2023-09-09 NOTE — Telephone Encounter (Signed)
 Left message for pt to call back regarding Eliquis.

## 2023-09-09 NOTE — Telephone Encounter (Signed)
 Ouch. I'm glad nothing happened.

## 2023-09-09 NOTE — Telephone Encounter (Signed)
 Pt called back to discuss Eliquis. Patient identification verified by 2 forms. Pt states that she believes that she is supposed to be on this medication and isn't sure why it was taken off her medication list. Attempted to send in refill, advisory warning shows that pt currently has prescription for Xarelto 20mg  with supper. Called pt back regarding this. Pt confirms with husband in background that she is taking both Eliquis 5mg  BID and Xarelto 20mg  with supper. Explained that these are similar medications and she should only be on one of these medications. Pt will stop taking Eliquis and continue taking Xarelto 20mg  with supper. Pt would like refills sent in. Pt verbalizes understanding.

## 2023-09-10 ENCOUNTER — Telehealth: Payer: Self-pay | Admitting: Pulmonary Disease

## 2023-09-10 DIAGNOSIS — R9389 Abnormal findings on diagnostic imaging of other specified body structures: Secondary | ICD-10-CM

## 2023-09-10 DIAGNOSIS — H43813 Vitreous degeneration, bilateral: Secondary | ICD-10-CM | POA: Diagnosis not present

## 2023-09-10 DIAGNOSIS — J479 Bronchiectasis, uncomplicated: Secondary | ICD-10-CM

## 2023-09-10 DIAGNOSIS — H353122 Nonexudative age-related macular degeneration, left eye, intermediate dry stage: Secondary | ICD-10-CM | POA: Diagnosis not present

## 2023-09-10 DIAGNOSIS — H43391 Other vitreous opacities, right eye: Secondary | ICD-10-CM | POA: Diagnosis not present

## 2023-09-10 NOTE — Telephone Encounter (Signed)
 Spoke with patient regarding prior message.Patient stated she wanted to know when she was having her procedure Bronch. Patient had lab work done in February and also had labs with Dr.Kozlow. Patient has been miserable sob,coughing ,wheezing . Patient would like to have this procedure done before may. Patient is also going to the [facility] the 1st week of April for a week . Advised patient Dr.Dewald will be in the office on Friday.Patient was ok to wait until he come into the office   Dr.Dewald can you please advise.  Thank you .

## 2023-09-10 NOTE — Telephone Encounter (Signed)
   Please advise if patient can switch to Xarelto? Per chart review Eliquis was not approved in 12/2022

## 2023-09-10 NOTE — Telephone Encounter (Signed)
 Patient would like to know when Bronch procedure will be scheduled. Patient phone number is [phone].

## 2023-09-10 NOTE — Telephone Encounter (Signed)
 Follow Up;        Marland KitchenShe said she wanted the Eliquis and not the Xarelto.She said the Xarelto was called in.She said.      *STAT* If patient is at the pharmacy, call can be transferred to refill team.   1. Which medications need to be refilled? (please list name of each medication and dose if known) Eliquis   2. Would you like to learn more about the convenience, safety, & potential cost savings by using the [facility]?     3. Are you open to using the [facility] (Type [facility].   4. Which pharmacy/location (including street and city if local pharmacy) is medication to be sent to? [facility] [address] [place]   5. Do they need a 30 day or 90 day supply?

## 2023-09-11 ENCOUNTER — Other Ambulatory Visit (HOSPITAL_COMMUNITY): Payer: Self-pay

## 2023-09-11 ENCOUNTER — Ambulatory Visit (HOSPITAL_COMMUNITY)
Admission: RE | Admit: 2023-09-11 | Discharge: 2023-09-11 | Disposition: A | Discharge status: Home / Self Care | Source: Ambulatory Visit | Attending: Pulmonary Disease | Admitting: Pulmonary Disease

## 2023-09-11 ENCOUNTER — Telehealth: Payer: Self-pay | Admitting: Pharmacy Technician

## 2023-09-11 DIAGNOSIS — R9389 Abnormal findings on diagnostic imaging of other specified body structures: Secondary | ICD-10-CM | POA: Diagnosis not present

## 2023-09-11 DIAGNOSIS — J479 Bronchiectasis, uncomplicated: Secondary | ICD-10-CM | POA: Diagnosis not present

## 2023-09-11 DIAGNOSIS — R918 Other nonspecific abnormal finding of lung field: Secondary | ICD-10-CM | POA: Diagnosis not present

## 2023-09-11 MED ORDER — APIXABAN 5 MG PO TABS: 5.0000 mg | ORAL_TABLET | Freq: Two times a day (BID) | ORAL | 1 refills | Status: DC

## 2023-09-11 MED ORDER — IPRATROPIUM BROMIDE 0.03 % NA SOLN: 2.0000 | Freq: Two times a day (BID) | NASAL | 4 refills | Status: DC

## 2023-09-11 NOTE — Telephone Encounter (Signed)
 Pharmacy Patient Advocate Encounter  Insurance verification completed.   The patient is insured through [employer] claim for Eliquis. Currently a quantity of 30 Tabs for 30 days or 60 tabs for 30 day supply and the co-pay is 47.00 . The current 09/11/23 day co-pay is, $47.00.  No PA needed at this time.  This test claim was processed through [facility]- copay amounts may vary at other pharmacies due to pharmacy/plan contracts, or as the patient moves through the different stages of their insurance plan.

## 2023-09-11 NOTE — Telephone Encounter (Signed)
 Attempted to call patient, no answer left message requesting a call back.

## 2023-09-11 NOTE — Telephone Encounter (Signed)
 Please let patient know I will be ordering a follow up CT scan since it has been three months from her last scan and I will schedule her for a bronchoscopy on Friday 3/21 if endoscopy has availability. She will need to hold her Xarelto starting 2 days prior to the procedure.  Thanks, Dr. Francine Graven

## 2023-09-11 NOTE — Telephone Encounter (Signed)
 Patient identification verified by 2 forms. [name], RN    Called and spoke to patient  Informed refill sent to pharmacy  Patient has no further questions at this time

## 2023-09-11 NOTE — Telephone Encounter (Signed)
 Patient identification verified by 2 forms. [name], RN    Called and spoke to patient  Patient states:   -she would prefer eliquis Rx   -would also like refill for Ipratropium [facility]   -she has not followed up with ENT, waiting for PCP to send referral  Informed patient:   -Eliquis Rx sent to [facility] pharmacy (5mg  BID)   -she should not be taking Xarelto 20mg  Rx   -message sent to Dr. Royann Shivers regarding Ipratropium refill  Patient verbalized understanding, no questions at this time

## 2023-09-11 NOTE — Telephone Encounter (Signed)
 Both Eliquis and Xarelto are covered on her plan. She should be on one of them, not both. Either Eliquis 5mg  BID or Xarelto 20mg  once daily. Can you please verify what she prefers?

## 2023-09-11 NOTE — Telephone Encounter (Signed)
 ATX X2. Lmtcb   I am also going to send a Mychart message.

## 2023-09-11 NOTE — Telephone Encounter (Signed)
ATC x1 LVM for patient to call our office back. 

## 2023-09-11 NOTE — Telephone Encounter (Signed)
 Refill the ipratropium, please

## 2023-09-15 ENCOUNTER — Telehealth: Payer: Self-pay | Admitting: Pulmonary Disease

## 2023-09-15 DIAGNOSIS — J479 Bronchiectasis, uncomplicated: Secondary | ICD-10-CM

## 2023-09-15 NOTE — Telephone Encounter (Signed)
 Bronc is Friday, she said. What time and where. Her # is [phone]

## 2023-09-15 NOTE — Telephone Encounter (Signed)
 JD requested Bronch to be scheduled for 3/21  Per kim in scheduling 3/21 is inpatient only, shannon love could sch if possible but JD needs to send a request to [facility] endo.   Called pt in attempt to inform her this is not scheduled yet, left her a vm

## 2023-09-15 NOTE — Telephone Encounter (Signed)
 PT writes the following on MYCHART:  What time is my bronchoscopy study to be done on Friday, March 21st? Also, where is it to done. All I was told was it would be done on Friday, 3/21. I look to hear back ASAP. Thanks, Amber Hefty. Bennett   Please call to advise.

## 2023-09-15 NOTE — Telephone Encounter (Signed)
 Patient is wondering when and where her bronch will be. Please call her back at [phone]

## 2023-09-17 ENCOUNTER — Encounter: Payer: Self-pay | Admitting: Pulmonary Disease

## 2023-09-17 NOTE — Telephone Encounter (Signed)
 Case request sent to [facility] endo.  JD

## 2023-09-17 NOTE — Telephone Encounter (Signed)
 Case request entered for [facility].  Thanks, JD

## 2023-09-18 ENCOUNTER — Telehealth: Payer: Self-pay | Admitting: Pulmonary Disease

## 2023-09-18 ENCOUNTER — Encounter (HOSPITAL_COMMUNITY): Payer: Self-pay | Admitting: Pulmonary Disease

## 2023-09-18 ENCOUNTER — Other Ambulatory Visit: Payer: Self-pay

## 2023-09-18 NOTE — Progress Notes (Signed)
 Message left for Dr. Francine Graven regarding patient is taking Eliquis instead of xarleto. Per patient last dose of eliquis 09-16-23

## 2023-09-18 NOTE — Progress Notes (Signed)
 Case: [id number] Date/Time: 09/19/23 0815   Procedure: BRONCHOSCOPY, FLEXIBLE (Bilateral) - bronchoscopy for BAL   Anesthesia type: General   Diagnosis: Bronchiectasis without complication (HCC) [J47.9]   Pre-op diagnosis: pneumonia   [facility] / [facility]   Surgeons: Martina Sinner, MD       DISCUSSION: Amber Bennett is a 73 yo female who is being evaluated as SDW prior to surgery above. PMH of former smoking, HTN, aortic and coronary atherosclerosis, PAF/flutter s/p ablation (03/2023), OSA (uses CPAP), bronchiectasis, chronic SOB, hx of seizures, hepatic steatosis, DM, dermatomyositis, depression.  Prior anesthesia complication includes difficult intubation during ablation procedure. Per notes: "Comments: Grade IIb view w/BURP maneuver, limited view & small glottic aperature, recommend glidescope for future intubations"  Patient was referred to pulmonology for progressive SOB. Now scheduled for bronch due to abnormal chest CT.  Patient referred to Cardiology in June 2024 for new onset A. Flutter. She had cardiac monitoring done which showed high burden of A.flutter and was referred to EP for ablation which was done on 03/13/23 which was successful however SOB persisted 2/2 problem above. Last seen in clinic on 08/27/23 and noted to be doing well from cardiac standpoint. Advised f/u in 6 months.  Patient follows with Neurology for seizures. Last seen on 12/11/22. She takes Topamax, Gabapentin, Phenobarbital.  LD Eliquis 09-16-23   VS: LMP 07/01/1980   PROVIDERS: Rodrigo Ran, MD   LABS: n/a   IMAGES: CT Chest 09/11/23:  IMPRESSION: 1. Interval resolution of airspace consolidation within the basilar right upper lobe. In the area of the previous consolidative change there is a cluster of lung nodules which measure up to 5 mm. The clustered appearance of these nodules strongly suggest sequelae of an inflammatory or infectious process. Most likely chronic  atypical infection such as MAI 2. Interval resolution of clustered nodularity within the anteromedial left upper lobe. 3. Stable appearance of peripheral bronchiolectasis with peripheral clustered tree-in-bud nodularity within the right middle lobe and posterior lingula. Findings are also favored to represent sequelae of chronic atypical mycobacterial infection. 4. Stable 5 mm peripheral subpleural nodule in the posterolateral right middle lobe. 5. Coronary artery calcifications noted. 6.  Aortic Atherosclerosis (ICD10-I70.0).  EKG 08/27/23:  Sinus rhythm with 1st degree A-V block, rate 68  CV:  Cardiac CT 03/05/23:  IMPRESSION: 1. There is normal pulmonary vein drainage into the left atrium. Measurements as reported. Small ostium of left inferior pulmonary vein (measures 1.6cm x 0.5cm in diameter with area 0.7cm^2)   2. There is no thrombus in the left atrial appendage.   3. The esophagus courses posterior to ostium of right inferior pulmonary vein   4. PFO   5. Coronary calcium score 0  Stress test 01/20/23:    No ischemia. There is a small mild fixed mid-anterior defect that is most consistent with breast attenuation given normal wall motion. The study is overall low risk due to grossly normal perfusion, normal LV function and no TID. Patient was symptomatic after stress testing, see separate documentation from Dr. Royann Shivers. If clinical concern persists, consider cardiac PET-CT with myocardial blood flow or coronary CTA.   No ST deviation was noted.   LV perfusion is abnormal. Defect 1: There is a small defect with mild reduction in uptake present in the mid anterior location(s) that is fixed. There is normal wall motion in the defect area. Consistent with artifact caused by breast attenuation.   Left ventricular function is normal. Nuclear stress EF: 69%.  The left ventricular ejection fraction is hyperdynamic (>65%). End diastolic cavity size is normal. End systolic cavity  size is normal.   Prior study not available for comparison.    Echo 01/20/23:  IMPRESSIONS     1. Left ventricular ejection fraction, by estimation, is 60 to 65%. The  left ventricle has normal function. The left ventricle has no regional  wall motion abnormalities. There is mild asymmetric left ventricular  hypertrophy of the basal-septal segment.  Left ventricular diastolic parameters are indeterminate.   2. Right ventricular systolic function is normal. The right ventricular  size is normal.   3. Left atrial size was mildly dilated.   4. The mitral valve is grossly normal. Mild mitral valve regurgitation.  No evidence of mitral stenosis.   5. The aortic valve is tricuspid. Aortic valve regurgitation is mild.   6. The inferior vena cava is normal in size with greater than 50%  respiratory variability, suggesting right atrial pressure of 3 mmHg.   Comparison(s): No prior Echocardiogram.   Cardiac monitor 01/10/23:  Impression: Paroxysmal Atrial fibrillation and flutter were present (48% burden).  Occasional PACs (1.1% burden).   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   Carpal tunnel syndrome    Cervical radiculitis    Collar bone fracture    Depression    Dermatomyositis (HCC)    Diabetes mellitus without complication (HCC)    Elevated liver enzymes    External hemorrhoids    Fatty liver    Fibromyalgia affecting hand 08/2022   bilateral wrist and hands   Hematuria    negative evaluation   High cholesterol    HTN (hypertension)    Internal hemorrhoids    Laryngitis    Lower back pain    Macular degeneration    Osteopenia    Left side hip   Seizures (HCC)    09/07/2019 pt reports last seizure in 2014   Tubular adenoma of colon     Past Surgical History:  Procedure Laterality Date   ABDOMINAL HYSTERECTOMY  1982   ADENOIDECTOMY     APPENDECTOMY     ATRIAL FIBRILLATION ABLATION  N/A 03/13/2023   Procedure: ATRIAL FIBRILLATION ABLATION;  Surgeon: Regan Lemming, MD;  [facility] IN[facility];  Service: Cardiovascular;  Laterality: N/A;   BACK SURGERY     BACK SURGERY  07/2015   CARPAL TUNNEL RELEASE     CATARACT EXTRACTION, BILATERAL     CHOLECYSTECTOMY  2003   COLONOSCOPY     EXPLORATORY LAPAROTOMY     LAMINECTOMY     SHOULDER SURGERY Right 2000   SINOSCOPY     TONSILLECTOMY     TOTAL HIP ARTHROPLASTY Right 02/12/2022   Procedure: RIGHT TOTAL HIP ARTHROPLASTY ANTERIOR APPROACH;  Surgeon: Kathryne Hitch, MD;  Location: [facility] OR;  Service: Orthopedics;  Laterality: Right;    MEDICATIONS: No current facility-administered medications for this encounter.    Alirocumab (PRALUENT) 150 MG/ML SOAJ   amLODipine (NORVASC) 2.5 MG tablet   apixaban (ELIQUIS) 5 MG TABS tablet   Ascorbic Acid (VITAMIN C) 1000 MG tablet   beclomethasone (QVAR REDIHALER) 80 MCG/ACT inhaler   Calcium Carb-Cholecalciferol (CALTRATE 600+D3 PO)   Cholecalciferol (VITAMIN D) 50 MCG (2000 UT) tablet   Coenzyme Q10 (CO Q 10 PO)   colestipol (COLESTID) 1 g tablet   cyclobenzaprine (FLEXERIL) 5 MG tablet   diphenoxylate-atropine (LOMOTIL)  2.5-0.025 MG tablet   DULoxetine (CYMBALTA) 30 MG capsule   ezetimibe (ZETIA) 10 MG tablet   furosemide (LASIX) 20 MG tablet   gabapentin (NEURONTIN) 800 MG tablet   ibandronate (BONIVA) 150 MG tablet   ipratropium (ATROVENT) 0.03 % nasal spray   ipratropium-albuterol (DUONEB) 0.5-2.5 (3) MG/3ML SOLN   loratadine (CLARITIN) 10 MG tablet   metoprolol tartrate (LOPRESSOR) 50 MG tablet   mometasone (ASMANEX) 220 MCG/ACT inhaler   montelukast (SINGULAIR) 10 MG tablet   Multiple Vitamins-Minerals (MULTIVITAMIN PO)   Multiple Vitamins-Minerals (PRESERVISION AREDS PO)   Olopatadine-Mometasone (RYALTRIS) 665-25 MCG/ACT SUSP   omega-3 acid ethyl esters (LOVAZA) 1 g capsule   omeprazole (PRILOSEC) 20 MG capsule   PHENobarbital (LUMINAL) 97.2 MG  tablet   potassium chloride SA (KLOR-CON M) 20 MEQ tablet   PRALUENT 150 MG/ML SOAJ   pyridoxine (B-6) 100 MG tablet   telmisartan (MICARDIS) 40 MG tablet   topiramate (TOPAMAX) 50 MG tablet   vitamin E 400 UNIT capsule   Ubaldo Glassing, PA-C [facility] Surgical Short Stay/Anesthesiology [facility] Phone [phone] 09/18/2023 8:58 AM

## 2023-09-18 NOTE — Telephone Encounter (Signed)
 Pt states she has finished the cipro, flagyl, and colestid that Dr Rhea Belton prescribed for her. States she is still having about 10-12 liquid diarrhea stools/day. Also states the lomotil is not helping. She is afraid to leave the house, fears she may have an accident. Pt is requesting something to help with the diarrhea and wonders if she needs to do more stool tests. Please advise.

## 2023-09-18 NOTE — Telephone Encounter (Signed)
 Returned phone call to preadmission testing.  I was advised that patient is taking Eliquis, not xarelto.  She did stop taking it on the 18th.  She just wanted [place] to know what blood thinner she is taking.  I let her know I would send an FYI to Dr. Francine Graven.  Nothing further needed.  Dr. Francine Graven, This is just an FYI to let you know that the patient is on Eliquis, not Xarelto.  She did stop taking it on 3/18.  Thank you.

## 2023-09-18 NOTE — Telephone Encounter (Signed)
 Inbound call from patient requesting to speak with a nurse in regards to diarrhea. Please advise.

## 2023-09-18 NOTE — Progress Notes (Signed)
 PCP - Rodrigo Ran, MD  Cardiologist - Croitoru, Mihai, MD Electrophysiology - Cardiology Regan Lemming, MD   PPM/ICD - denies Device Orders - n/a Rep Notified - n/a  Chest x-ray -  PFT- 01-20-23 EKG - 08-27-23 Stress Test - 01-20-23 ECHO - 01-20-23 Cardiac Cath - denies  CPAP - uses nightly  GLP-1 - No longer takes   Fasting Blood Sugar - per patient between 105-114 Checks Blood Sugar per patient she checks blood sugar a couple times a week. MD monitors A1C  Blood Thinner Instructions: apixaban (ELIQUIS) per patient Last dose 09-16-23 Aspirin Instructions: denies  ERAS Protcol - NPO  COVID TEST- n/a  Anesthesia review: yes  Patient verbally denies any shortness of breath, fever, cough and chest pain during phone call   -------------  SDW INSTRUCTIONS given:  Your procedure is scheduled on March 21,2025.  Report to [facility] Main Entrance "A" at 5:45 A.M., and check in at the Admitting office.  Call this number if you have problems the morning of surgery:  [phone]   Remember:  Do not eat or drink  after midnight the night before your surgery      Take these medicines the morning of surgery with A SIP OF WATER  beclomethasone (QVAR REDIHALER)  DULoxetine (CYMBALTA)  gabapentin (NEURONTIN)  ipratropium (ATROVENT) nasal spray  metoprolol tartrate (LOPRESSOR)  Olopatadine-Mometasone (RYALTRIS)  omeprazole (PRILOSEC)  PHENobarbital (LUMINAL)  topiramate (TOPAMAX)   IF NEEDED cyclobenzaprine (FLEXERIL)  ipratropium-albuterol (DUONEB)  loratadine (CLARITIN)   As of today, STOP taking any Aspirin (unless otherwise instructed by your surgeon) Aleve, Naproxen, Ibuprofen, Motrin, Advil, Goody's, BC's, all herbal medications, fish oil, and all vitamins.                      Do not wear jewelry, make up, or nail polish            Do not wear lotions, powders, perfumes/colognes, or deodorant.            Do not shave 48 hours prior to surgery.  Men may  shave face and neck.            Do not bring valuables to the hospital.            [facility] is not responsible for any belongings or valuables.  Do NOT Smoke (Tobacco/Vaping) 24 hours prior to your procedure If you use a CPAP at night, you may bring all equipment for your overnight stay.   Contacts, glasses, dentures or bridgework may not be worn into surgery.      For patients admitted to the hospital, discharge time will be determined by your treatment team.   Patients discharged the day of surgery will not be allowed to drive home, and someone needs to stay with them for 24 hours.    Special instructions:   [place]- Preparing For Surgery  Before surgery, you can play an important role. Because skin is not sterile, your skin needs to be as free of germs as possible. You can reduce the number of germs on your skin by washing with CHG (chlorahexidine gluconate) Soap before surgery.  CHG is an antiseptic cleaner which kills germs and bonds with the skin to continue killing germs even after washing.    Oral Hygiene is also important to reduce your risk of infection.  Remember - BRUSH YOUR TEETH THE MORNING OF SURGERY WITH YOUR REGULAR TOOTHPASTE  Please do not use if you have  an allergy to CHG or antibacterial soaps. If your skin becomes reddened/irritated stop using the CHG.  Do not shave (including legs and underarms) for at least 48 hours prior to first CHG shower. It is OK to shave your face.  Please follow these instructions carefully.   Shower the NIGHT BEFORE SURGERY and the MORNING OF SURGERY with DIAL Soap.   Pat yourself dry with a CLEAN TOWEL.  Wear CLEAN PAJAMAS to bed the night before surgery  Place CLEAN SHEETS on your bed the night of your first shower and DO NOT SLEEP WITH PETS.   Day of Surgery: Please shower morning of surgery  Wear Clean/Comfortable clothing the morning of surgery Do not apply any deodorants/lotions.   Remember to brush your teeth WITH  YOUR REGULAR TOOTHPASTE.   Questions were answered. Patient verbalized understanding of instructions.

## 2023-09-18 NOTE — Telephone Encounter (Signed)
 Patein has held off on the eliquis and she has been taking the zeralto. She has a procedure tomorrow. [phone]

## 2023-09-19 ENCOUNTER — Ambulatory Visit (HOSPITAL_COMMUNITY): Payer: Self-pay | Admitting: Medical

## 2023-09-19 ENCOUNTER — Other Ambulatory Visit: Payer: Self-pay

## 2023-09-19 ENCOUNTER — Ambulatory Visit (HOSPITAL_BASED_OUTPATIENT_CLINIC_OR_DEPARTMENT_OTHER): Payer: Self-pay | Admitting: Medical

## 2023-09-19 ENCOUNTER — Encounter (HOSPITAL_COMMUNITY): Payer: Self-pay | Admitting: Pulmonary Disease

## 2023-09-19 ENCOUNTER — Ambulatory Visit
Admission: RE | Admit: 2023-09-19 | Discharge: 2023-09-19 | Disposition: A | Discharge status: Home / Self Care | Source: Ambulatory Visit | Attending: Gastroenterology | Admitting: Gastroenterology

## 2023-09-19 ENCOUNTER — Ambulatory Visit (HOSPITAL_COMMUNITY)
Admission: RE | Admit: 2023-09-19 | Discharge: 2023-09-19 | Disposition: A | Discharge status: Home / Self Care | Source: Ambulatory Visit | Attending: Pulmonary Disease | Admitting: Pulmonary Disease

## 2023-09-19 ENCOUNTER — Encounter (HOSPITAL_COMMUNITY)
Admission: RE | Disposition: A | Discharge status: Home / Self Care | Payer: Self-pay | Source: Ambulatory Visit | Attending: Pulmonary Disease

## 2023-09-19 ENCOUNTER — Ambulatory Visit (HOSPITAL_COMMUNITY)

## 2023-09-19 DIAGNOSIS — M797 Fibromyalgia: Secondary | ICD-10-CM | POA: Insufficient documentation

## 2023-09-19 DIAGNOSIS — R197 Diarrhea, unspecified: Secondary | ICD-10-CM

## 2023-09-19 DIAGNOSIS — Z87891 Personal history of nicotine dependence: Secondary | ICD-10-CM | POA: Insufficient documentation

## 2023-09-19 DIAGNOSIS — R109 Unspecified abdominal pain: Secondary | ICD-10-CM

## 2023-09-19 DIAGNOSIS — K760 Fatty (change of) liver, not elsewhere classified: Secondary | ICD-10-CM | POA: Diagnosis not present

## 2023-09-19 DIAGNOSIS — R918 Other nonspecific abnormal finding of lung field: Secondary | ICD-10-CM | POA: Diagnosis not present

## 2023-09-19 DIAGNOSIS — I4892 Unspecified atrial flutter: Secondary | ICD-10-CM | POA: Insufficient documentation

## 2023-09-19 DIAGNOSIS — E119 Type 2 diabetes mellitus without complications: Secondary | ICD-10-CM | POA: Insufficient documentation

## 2023-09-19 DIAGNOSIS — J479 Bronchiectasis, uncomplicated: Secondary | ICD-10-CM | POA: Insufficient documentation

## 2023-09-19 DIAGNOSIS — Z7901 Long term (current) use of anticoagulants: Secondary | ICD-10-CM | POA: Diagnosis not present

## 2023-09-19 DIAGNOSIS — I10 Essential (primary) hypertension: Secondary | ICD-10-CM

## 2023-09-19 DIAGNOSIS — G40909 Epilepsy, unspecified, not intractable, without status epilepticus: Secondary | ICD-10-CM | POA: Diagnosis not present

## 2023-09-19 DIAGNOSIS — G4733 Obstructive sleep apnea (adult) (pediatric): Secondary | ICD-10-CM | POA: Insufficient documentation

## 2023-09-19 DIAGNOSIS — Z79899 Other long term (current) drug therapy: Secondary | ICD-10-CM | POA: Diagnosis not present

## 2023-09-19 DIAGNOSIS — I4891 Unspecified atrial fibrillation: Secondary | ICD-10-CM | POA: Insufficient documentation

## 2023-09-19 DIAGNOSIS — I480 Paroxysmal atrial fibrillation: Secondary | ICD-10-CM | POA: Insufficient documentation

## 2023-09-19 DIAGNOSIS — M3390 Dermatopolymyositis, unspecified, organ involvement unspecified: Secondary | ICD-10-CM | POA: Insufficient documentation

## 2023-09-19 DIAGNOSIS — K5900 Constipation, unspecified: Secondary | ICD-10-CM | POA: Diagnosis not present

## 2023-09-19 DIAGNOSIS — F32A Depression, unspecified: Secondary | ICD-10-CM | POA: Diagnosis not present

## 2023-09-19 DIAGNOSIS — Z96641 Presence of right artificial hip joint: Secondary | ICD-10-CM | POA: Diagnosis not present

## 2023-09-19 HISTORY — PX: BRONCHIAL WASHINGS: SHX5105

## 2023-09-19 HISTORY — DX: Fibromyalgia: M79.7

## 2023-09-19 HISTORY — DX: Cardiac arrhythmia, unspecified: I49.9

## 2023-09-19 HISTORY — DX: Anemia, unspecified: D64.9

## 2023-09-19 HISTORY — DX: Nonalcoholic steatohepatitis (NASH): K75.81

## 2023-09-19 HISTORY — DX: Unspecified osteoarthritis, unspecified site: M19.90

## 2023-09-19 HISTORY — PX: FLEXIBLE BRONCHOSCOPY: SHX5094

## 2023-09-19 LAB — CBC
HCT: 39 % (ref 36.0–46.0)
Hemoglobin: 13.1 g/dL (ref 12.0–15.0)
MCH: 32.8 pg (ref 26.0–34.0)
MCHC: 33.6 g/dL (ref 30.0–36.0)
MCV: 97.5 fL (ref 80.0–100.0)
Platelets: 180 10*3/uL (ref 150–400)
RBC: 4 MIL/uL (ref 3.87–5.11)
RDW: 12 % (ref 11.5–15.5)
WBC: 5.4 10*3/uL (ref 4.0–10.5)
nRBC: 0 % (ref 0.0–0.2)

## 2023-09-19 LAB — COMPREHENSIVE METABOLIC PANEL
ALT: 32 U/L (ref 0–44)
AST: 34 U/L (ref 15–41)
Albumin: 3.8 g/dL (ref 3.5–5.0)
Alkaline Phosphatase: 62 U/L (ref 38–126)
Anion gap: 8 (ref 5–15)
BUN: 12 mg/dL (ref 8–23)
CO2: 24 mmol/L (ref 22–32)
Calcium: 9.1 mg/dL (ref 8.9–10.3)
Chloride: 104 mmol/L (ref 98–111)
Creatinine, Ser: 0.65 mg/dL (ref 0.44–1.00)
GFR, Estimated: >60 mL/min (ref >60)
Glucose, Bld: 117 mg/dL — ABNORMAL HIGH (ref 70–99)
Potassium: 3.8 mmol/L (ref 3.5–5.1)
Sodium: 136 mmol/L (ref 135–145)
Total Bilirubin: 0.5 mg/dL (ref 0.0–1.2)
Total Protein: 7 g/dL (ref 6.5–8.1)

## 2023-09-19 LAB — BODY FLUID CELL COUNT WITH DIFFERENTIAL
Eos, Fluid: 3 %
Eos, Fluid: 3 %
Lymphs, Fluid: 17 %
Lymphs, Fluid: 18 %
Monocyte-Macrophage-Serous Fluid: 23 % — ABNORMAL LOW (ref 50–90)
Monocyte-Macrophage-Serous Fluid: 34 % — ABNORMAL LOW (ref 50–90)
Neutrophil Count, Fluid: 45 % — ABNORMAL HIGH (ref 0–25)
Neutrophil Count, Fluid: 57 % — ABNORMAL HIGH (ref 0–25)
Total Nucleated Cell Count, Fluid: 120 uL (ref 0–1000)
Total Nucleated Cell Count, Fluid: 141 uL (ref 0–1000)

## 2023-09-19 LAB — TYPE AND SCREEN
ABO/RH(D): O POS
Antibody Screen: NEGATIVE

## 2023-09-19 LAB — GLUCOSE, CAPILLARY
Glucose-Capillary: 122 mg/dL — ABNORMAL HIGH (ref 70–99)
Glucose-Capillary: 99 mg/dL (ref 70–99)

## 2023-09-19 LAB — SURGICAL PCR SCREEN
MRSA, PCR: NEGATIVE
Staphylococcus aureus: NEGATIVE

## 2023-09-19 SURGERY — BRONCHOSCOPY, FLEXIBLE
Anesthesia: General | Laterality: Bilateral

## 2023-09-19 MED ORDER — DIPHENHYDRAMINE HCL 50 MG/ML IJ SOLN
INTRAMUSCULAR | Status: DC | PRN
  Administered 2023-09-19 (×2): 25 mg via INTRAVENOUS

## 2023-09-19 MED ORDER — OXYCODONE HCL 5 MG/5ML PO SOLN: 5.0000 mg | Freq: Once | ORAL | Status: DC | PRN

## 2023-09-19 MED ORDER — SUGAMMADEX SODIUM 200 MG/2ML IV SOLN
INTRAVENOUS | Status: DC | PRN
  Administered 2023-09-19: 400 mg via INTRAVENOUS

## 2023-09-19 MED ORDER — OXYCODONE HCL 5 MG PO TABS: 5.0000 mg | ORAL_TABLET | Freq: Once | ORAL | Status: DC | PRN

## 2023-09-19 MED ORDER — ONDANSETRON HCL 4 MG/2ML IJ SOLN
INTRAMUSCULAR | Status: DC | PRN
  Administered 2023-09-19: 4 mg via INTRAVENOUS

## 2023-09-19 MED ORDER — FENTANYL CITRATE (PF) 100 MCG/2ML IJ SOLN: 25.0000 ug | INTRAMUSCULAR | Status: DC | PRN

## 2023-09-19 MED ORDER — FENTANYL CITRATE (PF) 250 MCG/5ML IJ SOLN
INTRAMUSCULAR | Status: DC | PRN
  Administered 2023-09-19 (×2): 50 ug via INTRAVENOUS

## 2023-09-19 MED ORDER — FENTANYL CITRATE (PF) 100 MCG/2ML IJ SOLN
INTRAMUSCULAR | Status: AC
  Filled 2023-09-19: qty 2

## 2023-09-19 MED ORDER — ONDANSETRON HCL 4 MG/2ML IJ SOLN: 4.0000 mg | Freq: Once | INTRAMUSCULAR | Status: DC | PRN

## 2023-09-19 MED ORDER — PROPOFOL 10 MG/ML IV BOLUS
INTRAVENOUS | Status: DC | PRN
  Administered 2023-09-19: 100 mg via INTRAVENOUS
  Administered 2023-09-19: 125 ug/kg/min via INTRAVENOUS

## 2023-09-19 MED ORDER — DEXAMETHASONE SODIUM PHOSPHATE 10 MG/ML IJ SOLN
INTRAMUSCULAR | Status: DC | PRN
  Administered 2023-09-19: 5 mg via INTRAVENOUS

## 2023-09-19 MED ORDER — ROCURONIUM BROMIDE 10 MG/ML (PF) SYRINGE
PREFILLED_SYRINGE | INTRAVENOUS | Status: DC | PRN
  Administered 2023-09-19: 70 mg via INTRAVENOUS

## 2023-09-19 MED ORDER — DIPHENHYDRAMINE HCL 50 MG/ML IJ SOLN
INTRAMUSCULAR | Status: AC
  Filled 2023-09-19: qty 1

## 2023-09-19 MED ORDER — LIDOCAINE 2% (20 MG/ML) 5 ML SYRINGE
INTRAMUSCULAR | Status: DC | PRN
Start: 2023-09-19 — End: 2023-09-19
  Administered 2023-09-19: 60 mg via INTRAVENOUS

## 2023-09-19 MED ORDER — COLESTIPOL HCL 1 G PO TABS: 2.0000 g | ORAL_TABLET | Freq: Two times a day (BID) | ORAL | 0 refills | Status: DC

## 2023-09-19 MED ORDER — CHLORHEXIDINE GLUCONATE 0.12 % MT SOLN
OROMUCOSAL | Status: AC
Start: 2023-09-19 — End: 2023-09-19
  Filled 2023-09-19: qty 15

## 2023-09-19 MED ORDER — LACTATED RINGERS IV SOLN: INTRAVENOUS | Status: DC

## 2023-09-19 MED ORDER — INSULIN ASPART 100 UNIT/ML IJ SOLN: 0.0000 [IU] | INTRAMUSCULAR | Status: DC | PRN

## 2023-09-19 NOTE — H&P (Signed)
 NAME:  Amber Bennett, MRN:  [id number], DOB:  Feb 07, 1951, LOS: 0 ADMISSION DATE:  09/19/2023, CONSULTATION DATE:  09/19/23 REFERRING MD:  na, CHIEF COMPLAINT:  dyspnea, bronchiectasis  History of Present Illness:  Amber Bennett is a 73 year old woman, former smoker with history of dermatomyositis, DMII, atrial fibrillation s/p ablation and hypertension who presents for bronchoscopy today.  She was seen in pulmonary clinic for dyspnea workup on 08/05/23. No major changes in her symptoms since visit.   Repeat CT Chest scan 09/11/23 shows resolution of right upper lobe air space consolidation with remaining cluster of lung nodules measuring 5mm and resolution of clustered nodularity within anteromedial left upper lung. Within right middle lobe there is peripheral bronchiolectasis with peripheral tree in bud nodularity. Focal mucoid impaction of the right middle lobe. Tree in bud nodularity of the lingula.   Given her on going symptoms she has agreed to proceed with bronchoscopy for BAL cultures. Risks and benefits of procedure explained. She has held her blood thinner.  Pertinent  Medical History   Past Medical History:  Diagnosis Date   Anemia    Angio-edema    Arthritis    Atherosclerosis of aorta (HCC)    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   Carpal tunnel syndrome    Cervical radiculitis    Collar bone fracture    Depression    Dermatomyositis (HCC)    Diabetes mellitus without complication (HCC)    Dysrhythmia    Elevated liver enzymes    External hemorrhoids    Fatty liver    Fibromyalgia    Fibromyalgia affecting hand 08/2022   bilateral wrist and hands   Hematuria    negative evaluation   High cholesterol    HTN (hypertension)    Internal hemorrhoids    Laryngitis    Lower back pain    Macular degeneration    Nonalcoholic steatohepatitis (NASH)    Osteopenia    Left side hip   Seizures (HCC)    09/07/2019 pt reports last seizure in 2014   Tubular adenoma of colon      Objective   Height 4\' 10"  (1.473 m), weight 73 kg, last menstrual period 07/01/1980.       No intake or output data in the 24 hours ending 09/19/23 0854 Filed Weights   09/18/23 1045  Weight: 73 kg    Examination: General: obese, no acute distress HENT: [place]/AT, dry mucous membranes Lungs: clear to auscultation, no wheezing Cardiovascular: rrr, no murmurs Abdomen: soft, non-tender Extremities: warm, no edema Neuro: alert, moving all extremities GU: n/a   Assessment & Plan:  Bronchiectasis Tree-in-bud nodularity of RML and Lingula  Plan: - Proceed with bronchoscopy with BAL for cultures   Labs   CBC: Recent Labs  Lab 09/19/23 0635  WBC 5.4  HGB 13.1  HCT 39.0  MCV 97.5  PLT 180    Basic Metabolic Panel: Recent Labs  Lab 09/19/23 0635  NA 136  K 3.8  CL 104  CO2 24  GLUCOSE 117*  BUN 12  CREATININE 0.65  CALCIUM 9.1   GFR: Estimated Creatinine Clearance: 51.5 mL/min (by C-G formula based on SCr of 0.65 mg/dL). Recent Labs  Lab 09/19/23 0635  WBC 5.4    Liver Function Tests: Recent Labs  Lab 09/19/23 0635  AST 34  ALT 32  ALKPHOS 62  BILITOT 0.5  PROT 7.0  ALBUMIN 3.8   No results for input(s): "LIPASE", "AMYLASE" in the last 168 hours.  No results for input(s): "AMMONIA" in the last 168 hours.  ABG No results found for: "PHART", "PCO2ART", "PO2ART", "HCO3", "TCO2", "ACIDBASEDEF", "O2SAT"   Coagulation Profile: No results for input(s): "INR", "PROTIME" in the last 168 hours.  Cardiac Enzymes: No results for input(s): "CKTOTAL", "CKMB", "CKMBINDEX", "TROPONINI" in the last 168 hours.  HbA1C: Hgb A1c MFr Bld  Date/Time Value Ref Range Status  02/05/2022 02:53 PM 4.9 4.8 - 5.6 % Final    Comment:    (NOTE) Pre diabetes:          5.7%-6.4%  Diabetes:              >6.4%  Glycemic control for   <7.0% adults with diabetes     CBG: Recent Labs  Lab 09/19/23 0634 09/19/23 0827  GLUCAP 122* 99    Review of Systems:    Review of Systems  Constitutional:  Negative for chills, fever, malaise/fatigue and weight loss.  HENT:  Negative for congestion, sinus pain and sore throat.   Eyes: Negative.   Respiratory:  Positive for shortness of breath. Negative for cough, hemoptysis, sputum production and wheezing.   Cardiovascular:  Negative for chest pain, palpitations, orthopnea, claudication and leg swelling.  Gastrointestinal:  Positive for diarrhea. Negative for abdominal pain, heartburn, nausea and vomiting.  Genitourinary: Negative.   Musculoskeletal:  Negative for joint pain and myalgias.  Skin:  Negative for rash.  Neurological:  Negative for weakness.  Endo/Heme/Allergies: Negative.   Psychiatric/Behavioral: Negative.       Past Medical History:  She,  has a past medical history of Anemia, Angio-edema, Arthritis, Atherosclerosis of aorta (HCC), Bertolotti's syndrome, Carpal tunnel syndrome, Cervical radiculitis, Collar bone fracture, Depression, Dermatomyositis (HCC), Diabetes mellitus without complication (HCC), Dysrhythmia, Elevated liver enzymes, External hemorrhoids, Fatty liver, Fibromyalgia, Fibromyalgia affecting hand (08/2022), Hematuria, High cholesterol, HTN (hypertension), Internal hemorrhoids, Laryngitis, Lower back pain, Macular degeneration, Nonalcoholic steatohepatitis (NASH), Osteopenia, Seizures (HCC), and Tubular adenoma of colon.   Surgical History:   Past Surgical History:  Procedure Laterality Date   ABDOMINAL HYSTERECTOMY  1982   ADENOIDECTOMY     APPENDECTOMY     ATRIAL FIBRILLATION ABLATION N/A 03/13/2023   Procedure: ATRIAL FIBRILLATION ABLATION;  Surgeon: Regan Lemming, MD;  Location: [facility] INVASIVE CV LAB;  Service: Cardiovascular;  Laterality: N/A;   BACK SURGERY     BACK SURGERY  07/2015   CARPAL TUNNEL RELEASE     CATARACT EXTRACTION, BILATERAL     CHOLECYSTECTOMY  2003   COLONOSCOPY     EXPLORATORY LAPAROTOMY     LAMINECTOMY     SHOULDER SURGERY Right 2000    SINOSCOPY     TONSILLECTOMY     TOTAL HIP ARTHROPLASTY Right 02/12/2022   Procedure: RIGHT TOTAL HIP ARTHROPLASTY ANTERIOR APPROACH;  Surgeon: Kathryne Hitch, MD;  Location: [facility] OR;  Service: Orthopedics;  Laterality: Right;     Social History:   reports that she quit smoking about 45 years ago. Her smoking use included cigarettes. She has never used smokeless tobacco. She reports current alcohol use of about 7.0 standard drinks of alcohol per week. She reports that she does not use drugs.   Family History:  Her family history includes Cancer in her mother; Colon cancer in her maternal aunt; Diabetes in her sister; Heart attack in her mother; Hypertension in her sister; Stroke in her sister. There is no history of Seizures, Esophageal cancer, Rectal cancer, or Stomach cancer.   Allergies Allergies  Allergen Reactions   Tylenol [Acetaminophen]  Swelling and Other (See Comments)    Throat swelling, anaphylaxis   Aspirin Other (See Comments)    gi bleeding in 2005   Cefdinir Other (See Comments)    Other Reaction(s): hives, itching   Colesevelam Other (See Comments)    Other Reaction(s): has never tried it but would not be a good choice as all seizure meds would have to be taken at least four hours prior to the dose and with her regimen that just won't be possible.   Ezetimibe-Simvastatin Other (See Comments)    Other Reaction(s): elevated LFT   Penicillin G Benzathine Itching   Rosuvastatin     Other Reaction(s): skin rash and myalgia   Wound Dressing Adhesive Other (See Comments)   Latex Itching    Rash      Home Medications  Prior to Admission medications   Medication Sig Start Date End Date Taking? Authorizing Provider  Alirocumab (PRALUENT) 150 MG/ML SOAJ Inject 1 mL (150 mg total) into the skin every 14 (fourteen) days. 07/16/23  Yes Rodrigo Ran, MD  amLODipine (NORVASC) 2.5 MG tablet Take 1 tablet (2.5 mg total) by mouth at bedtime. 12/02/22  Yes Rodrigo Ran, MD   beclomethasone (QVAR REDIHALER) 80 MCG/ACT inhaler Inhale 1 puff into the lungs 2 (two) times daily. 07/31/23  Yes Kozlow, Alvira Philips, MD  colestipol (COLESTID) 1 g tablet TAKE 2 TABLETS(2 GRAMS) BY MOUTH AT BEDTIME 09/08/23  Yes Pyrtle, Carie Caddy, MD  cyclobenzaprine (FLEXERIL) 5 MG tablet Take 1 tablet (5 mg total) by mouth every 8 (eight) hours as needed. 07/07/23  Yes Avva, Ravisankar, MD  diphenoxylate-atropine (LOMOTIL) 2.5-0.025 MG tablet Take 1-2 tablets by mouth every 4 hours as needed for diarrhea/loose stools 09/08/23  Yes Pyrtle, Carie Caddy, MD  DULoxetine (CYMBALTA) 30 MG capsule Take 1 capsule (30 mg total) by mouth every evening. 06/02/23  Yes Rodrigo Ran, MD  ezetimibe (ZETIA) 10 MG tablet Take 10 mg by mouth every evening.   Yes [provider]  furosemide (LASIX) 20 MG tablet Take 2 tablets TWICE DAILY until your weight is less than 159 pounds.  Once your weight is less than 159 pounds, take only 1 tablet daily 08/27/23  Yes Croitoru, Mihai, MD  gabapentin (NEURONTIN) 800 MG tablet Take 1 tablet (800 mg total) by mouth 2 (two) times daily. 10/28/22  Yes Perini, Loraine Leriche, MD  ibandronate (BONIVA) 150 MG tablet Take 150 mg by mouth every 30 (thirty) days. 01/25/23  Yes [provider]  ipratropium (ATROVENT) 0.03 % nasal spray Place 2 sprays into both nostrils every 12 (twelve) hours. 09/11/23  Yes Croitoru, Mihai, MD  loratadine (CLARITIN) 10 MG tablet Take 1 tablet (10 mg total) by mouth daily as needed for allergies (Can take an extra dose during flare ups.). 08/26/23  Yes Kozlow, Alvira Philips, MD  metoprolol tartrate (LOPRESSOR) 50 MG tablet Take 1 tablet (50 mg total) by mouth 2 (two) times daily. 02/07/23  Yes Croitoru, Mihai, MD  mometasone (ASMANEX) 220 MCG/ACT inhaler Inhale 1 puff into the lungs 2 (two) times daily. 07/31/23  Yes Kozlow, Alvira Philips, MD  Olopatadine-Mometasone (RYALTRIS) [phone] MCG/ACT SUSP Place 2 sprays into the nose 2 (two) times daily. 08/26/23  Yes Kozlow, Alvira Philips, MD   PHENobarbital (LUMINAL) 97.2 MG tablet TAKE 1 AND 1/2 TABLETS BY MOUTH EVERY DAY 07/01/23  Yes Anson Fret, MD  telmisartan (MICARDIS) 40 MG tablet Take 40 mg by mouth daily. 06/02/23  Yes [provider]  topiramate (TOPAMAX) 50 MG  tablet 1 tablet (50 mg total) in the morning AND 2 tablets (100 mg total) every evening. 03/24/23  Yes Butch Penny, NP  apixaban (ELIQUIS) 5 MG TABS tablet Take 1 tablet (5 mg total) by mouth 2 (two) times daily. 09/11/23   Croitoru, Mihai, MD  Ascorbic Acid (VITAMIN C) 1000 MG tablet Take 1,000 mg by mouth daily. [employer] Special educational needs teacher, Historical, MD  Calcium Carb-Cholecalciferol (CALTRATE 600+D3 PO) Take 1 tablet by mouth daily.    [provider]  Cholecalciferol (VITAMIN D) 50 MCG (2000 UT) tablet Take 2,000 Units by mouth daily.    [provider]  Coenzyme Q10 (CO Q 10 PO) Take 200 mg by mouth daily.    [provider]  ipratropium-albuterol (DUONEB) 0.5-2.5 (3) MG/3ML SOLN Take 3 mLs by nebulization 3 (three) times daily as needed. 08/05/23   Martina Sinner, MD  montelukast (SINGULAIR) 10 MG tablet Take 10 mg by mouth at bedtime. Patient not taking: Reported on 08/27/2023 11/21/12   [provider]  Multiple Vitamins-Minerals (MULTIVITAMIN PO) Take 1 tablet by mouth daily.    [provider]  Multiple Vitamins-Minerals (PRESERVISION AREDS PO) Take 1 capsule by mouth daily.    [provider]  omega-3 acid ethyl esters (LOVAZA) 1 g capsule Take 2 capsules (2 g total) by mouth 2 (two) times daily. 03/06/23   Rodrigo Ran, MD  omeprazole (PRILOSEC) 20 MG capsule Take 20 mg by mouth 2 (two) times daily before a meal. 08/19/22 07/17/23  [provider]  potassium chloride SA (KLOR-CON M) 20 MEQ tablet While taking 40 mg of Furosemide, take Potassium Chloride 20 meq twice a day. When you decrease Furosemide to 20 mg, decrease Potassium Chloride to 20 meq once a day. 03/17/23   Croitoru,  Mihai, MD  PRALUENT 150 MG/ML SOAJ Inject 150 mg into the skin every 14 (fourteen) days. Patient not taking: Reported on 08/27/2023 10/03/21   [provider]  pyridoxine (B-6) 100 MG tablet Take 100 mg by mouth daily.    [provider]  vitamin E 400 UNIT capsule Take 400 Units by mouth daily.    [provider]     Critical care time: n/a    Melody Comas, MD [facility] Office: [phone]   See Amion for personal pager PCCM on call pager [phone] until 7pm. Please call Elink 7p-7a. [phone]

## 2023-09-19 NOTE — Anesthesia Preprocedure Evaluation (Addendum)
 Anesthesia Evaluation  Patient identified by MRN, date of birth, ID band Patient awake    History of Anesthesia Complications (+) DIFFICULT AIRWAY and history of anesthetic complications  Airway Mallampati: III  TM Distance: >3 FB     Dental no notable dental hx. (+) Implants   Pulmonary neg COPD, former smoker   breath sounds clear to auscultation       Cardiovascular hypertension, + dysrhythmias Atrial Fibrillation  Rhythm:Regular Rate:Normal  Grossly normal TTE/stress test 2024   Neuro/Psych Seizures -, Well Controlled,  PSYCHIATRIC DISORDERS  Depression     Neuromuscular disease    GI/Hepatic ,neg GERD  ,,(+) Hepatitis -  Endo/Other  diabetes, Type 2    Renal/GU      Musculoskeletal  (+) Arthritis ,  Fibromyalgia -  Abdominal   Peds  Hematology  (+) Blood dyscrasia, anemia   Anesthesia Other Findings   Reproductive/Obstetrics                             Anesthesia Physical Anesthesia Plan  ASA: 3  Anesthesia Plan: General   Post-op Pain Management:    Induction: Intravenous  PONV Risk Score and Plan: 2 and Ondansetron  Airway Management Planned: Video Laryngoscope Planned and Oral ETT  Additional Equipment:   Intra-op Plan:   Post-operative Plan: Extubation in OR  Informed Consent: I have reviewed the patients History and Physical, chart, labs and discussed the procedure including the risks, benefits and alternatives for the proposed anesthesia with the patient or authorized representative who has indicated his/her understanding and acceptance.     Dental advisory given  Plan Discussed with: CRNA  Anesthesia Plan Comments:        Anesthesia Quick Evaluation

## 2023-09-19 NOTE — Progress Notes (Signed)
 CRITICAL RESULT PROVIDER NOTIFICATION  Test performed and critical result:  BAL-see preliminary results   Date and time result received:  3/21 @ 1315  Provider name/title: Francine Graven  Date and time provider notified: 3/21 @1325   Date and time provider responded: 3/21 @1325   Provider response:Evaluate remotely

## 2023-09-19 NOTE — Anesthesia Procedure Notes (Signed)
 Procedure Name: Intubation Date/Time: 09/19/2023 9:21 AM  Performed by: Debbe Odea, CRNAPre-anesthesia Checklist: Patient identified, Emergency Drugs available, Suction available and Patient being monitored Patient Re-evaluated:Patient Re-evaluated prior to induction Oxygen Delivery Method: Circle System Utilized Preoxygenation: Pre-oxygenation with 100% oxygen Induction Type: IV induction Ventilation: Mask ventilation without difficulty Laryngoscope Size: Glidescope and 3 Grade View: Grade II Tube type: Oral Tube size: 8.0 mm Number of attempts: 1 Airway Equipment and Method: Stylet Placement Confirmation: ETT inserted through vocal cords under direct vision, positive ETCO2 and breath sounds checked- equal and bilateral Secured at: 21 cm Tube secured with: Tape Dental Injury: Teeth and Oropharynx as per pre-operative assessment  Difficulty Due To: Difficulty was anticipated, Difficult Airway- due to limited oral opening, Difficult Airway- due to reduced neck mobility and Difficult Airway- due to anterior larynx

## 2023-09-19 NOTE — Op Note (Signed)
 Bronchoscopy Procedure Note  Amber Bennett  [id number]  Aug 08, 1950  Date:09/19/23  Time:9:37 AM   Provider Performing:Yuki Brunsman B Keilin Gamboa   Procedure(s):  Flexible bronchoscopy with bronchial alveolar lavage (40981)  Indication(s) Bronchiectasis  Consent Risks of the procedure as well as the alternatives and risks of each were explained to the patient and/or caregiver.  Consent for the procedure was obtained and is signed in the bedside chart  Anesthesia General   Time Out Verified patient identification, verified procedure, site/side was marked, verified correct patient position, special equipment/implants available, medications/allergies/relevant history reviewed, required imaging and test results available.   Sterile Technique Usual hand hygiene, masks, gowns, and gloves were used   Procedure Description Bronchoscope advanced through endotracheal tube and into airway.  Airways were examined down to subsegmental level with findings noted below.   Following diagnostic evaluation, BAL(s) performed in RML and Lingula with normal saline and return of 30-97mL fluid  Findings:  - Scant thick clear mucous scattered throughout both bronchial trees - Possible excessive dynamic airway collapse - Mild erythema of the RML and Lingula entrances - No endobronchial lesions noted   Complications/Tolerance None; patient tolerated the procedure well. Chest X-ray is needed post procedure.   EBL Minimal   Specimen(s) RML BAL - cytology, cell count differential, AFB/Fungal/Resp cultures Lingula BAL -  cytology, cell count differential, AFB/Fungal/Resp cultures

## 2023-09-19 NOTE — Transfer of Care (Signed)
 Immediate Anesthesia Transfer of Care Note  Patient: Amber Bennett  Procedure(s) Performed: BRONCHOSCOPY, FLEXIBLE (Bilateral) IRRIGATION, BRONCHUS  Patient Location: PACU  Anesthesia Type:General  Level of Consciousness: awake and sedated  Airway & Oxygen Therapy: Patient Spontanous Breathing and Patient connected to face mask oxygen  Post-op Assessment: Report given to RN and Post -op Vital signs reviewed and stable  Post vital signs: Reviewed and stable  Last Vitals:  Vitals Value Taken Time  BP 119/69 09/19/23 0950  Temp    Pulse 79 09/19/23 0950  Resp 18 09/19/23 0950  SpO2 95 % 09/19/23 0950  Vitals shown include unfiled device data.  Last Pain:  Vitals:   09/19/23 0648  PainSc: 0-No pain         Complications:  Encounter Notable Events  Notable Event Outcome Phase Comment  Difficult to intubate - expected  Intraprocedure Filed from anesthesia note documentation.

## 2023-09-19 NOTE — Telephone Encounter (Signed)
 Pt aware of recommendations per [facility] PA. Order in for xay, script sent to pharmacy, hemoccult cards up front for pick up.

## 2023-09-19 NOTE — Discharge Instructions (Signed)
 We will call you with the bronchoscopy results  If you have a fever within 24 hours of procedure, this is not abnormal. If fever persists beyond 24 hours please call our office.  You may resume regular activity and diet.

## 2023-09-19 NOTE — Anesthesia Postprocedure Evaluation (Signed)
 Anesthesia Post Note  Patient: Amber Bennett  Procedure(s) Performed: BRONCHOSCOPY, FLEXIBLE (Bilateral) IRRIGATION, BRONCHUS     Patient location during evaluation: PACU Anesthesia Type: General Level of consciousness: awake and alert Pain management: pain level controlled Vital Signs Assessment: post-procedure vital signs reviewed and stable Respiratory status: spontaneous breathing, nonlabored ventilation, respiratory function stable and patient connected to nasal cannula oxygen Cardiovascular status: blood pressure returned to baseline and stable Postop Assessment: no apparent nausea or vomiting Anesthetic complications: yes   Encounter Notable Events  Notable Event Outcome Phase Comment  Difficult to intubate - expected  Intraprocedure Filed from anesthesia note documentation.    Last Vitals:  Vitals:   09/19/23 1000 09/19/23 1010  BP: (!) 120/53 132/65  Pulse: 74 75  Resp: 14 16  Temp:    SpO2: 90% 94%    Last Pain:  Vitals:   09/19/23 1010  TempSrc:   PainSc: 0-No pain                 Mariann Barter

## 2023-09-21 ENCOUNTER — Encounter (HOSPITAL_COMMUNITY): Payer: Self-pay | Admitting: Pulmonary Disease

## 2023-09-21 LAB — ACID FAST SMEAR (AFB, MYCOBACTERIA)
Acid Fast Smear: NEGATIVE
Acid Fast Smear: NEGATIVE

## 2023-09-22 DIAGNOSIS — M25512 Pain in left shoulder: Secondary | ICD-10-CM | POA: Diagnosis not present

## 2023-09-22 LAB — CULTURE, BAL-QUANTITATIVE W GRAM STAIN: Culture: 1000 — AB

## 2023-09-22 LAB — CYTOLOGY - NON PAP

## 2023-09-24 LAB — ANAEROBIC CULTURE W GRAM STAIN

## 2023-09-25 DIAGNOSIS — R059 Cough, unspecified: Secondary | ICD-10-CM | POA: Diagnosis not present

## 2023-09-25 DIAGNOSIS — I4892 Unspecified atrial flutter: Secondary | ICD-10-CM | POA: Diagnosis not present

## 2023-09-25 DIAGNOSIS — R6884 Jaw pain: Secondary | ICD-10-CM | POA: Diagnosis not present

## 2023-09-25 DIAGNOSIS — R5383 Other fatigue: Secondary | ICD-10-CM | POA: Diagnosis not present

## 2023-09-25 DIAGNOSIS — R519 Headache, unspecified: Secondary | ICD-10-CM | POA: Diagnosis not present

## 2023-09-25 DIAGNOSIS — R0981 Nasal congestion: Secondary | ICD-10-CM | POA: Diagnosis not present

## 2023-09-25 DIAGNOSIS — Z1152 Encounter for screening for COVID-19: Secondary | ICD-10-CM | POA: Diagnosis not present

## 2023-09-25 DIAGNOSIS — K1120 Sialoadenitis, unspecified: Secondary | ICD-10-CM | POA: Diagnosis not present

## 2023-09-29 ENCOUNTER — Telehealth: Payer: Self-pay | Admitting: Internal Medicine

## 2023-09-29 ENCOUNTER — Telehealth: Payer: Self-pay | Admitting: *Deleted

## 2023-09-29 ENCOUNTER — Telehealth: Payer: Self-pay | Admitting: Pulmonary Disease

## 2023-09-29 ENCOUNTER — Other Ambulatory Visit (HOSPITAL_COMMUNITY)
Admission: RE | Admit: 2023-09-29 | Discharge: 2023-09-29 | Disposition: A | Discharge status: Home / Self Care | Source: Ambulatory Visit | Attending: Pulmonary Disease | Admitting: Pulmonary Disease

## 2023-09-29 DIAGNOSIS — J479 Bronchiectasis, uncomplicated: Secondary | ICD-10-CM

## 2023-09-29 DIAGNOSIS — R197 Diarrhea, unspecified: Secondary | ICD-10-CM

## 2023-09-29 NOTE — Telephone Encounter (Signed)
 Request for surgical clearance:     Endoscopy Procedure  What type of surgery is being performed?     colonoscopy  When is this surgery scheduled?     11/12/23  What type of clearance is required ?   Pharmacy  Are there any medications that need to be held prior to surgery and how long? Eliquis, 2 days  Practice name and name of physician performing surgery?      [facility]  What is your office phone and fax number?      Phone- [phone]  Fax- [phone]  Anesthesia type (None, local, MAC, general) ?       MAC  Please route your response to Vernia Buff, RN  Thank you!

## 2023-09-29 NOTE — Telephone Encounter (Signed)
 I spoke with patient about her bronchoscopy results.  Cultures are negative for bacteria. There is mold growing from right middle lobe, likely contaminant per lab report. I have spoken with the lab to send it out for further identification.   The cytology is negative for malignant cells.  Will plan to follow up a CT Chest scan in 3-6 months to further monitor the infiltrates/opacities.   Discussed working on weight loss and increasing her physical activity.  Melody Comas, MD [facility] Office: [phone]   See Amion for personal pager PCCM on call pager [phone] until 7pm. Please call Elink 7p-7a. [phone]

## 2023-09-29 NOTE — Telephone Encounter (Signed)
 Colonoscopy is probably the best next step We did colonoscopy in 2021 for this and microscopic colitis neg, but her fecal WBC was positive Have her submit fecal cal and c diff pcr 1 more time and then arrange colonoscopy if she is agreeable JMP

## 2023-09-29 NOTE — Telephone Encounter (Signed)
 Patient calls with continuing loose, frequent stools. She states that her symptoms are really no different than they have been over the last several months, but they are becoming more and more inconveniencing. No complaints of abdominal pain but does now have complaints of rectal irritation/bleeding from frequent toileting. Has been using desitin for this. Patient says she has been taking lomotil as directed, colestid 2 grams twice daily but continues to have loose stools. These occur with or without eating. States her son told her to start Align and metamucil so she has done this which has helped with the "looseness' but not frequency. Patient states she is going on a couples trip tomorrow and was really hoping we had some additional medications/recommendations to help before she leaves.

## 2023-09-29 NOTE — Telephone Encounter (Signed)
 Inbound call to speak to a nurse regarding her symptoms. She has extreme diarrhea and has BM after every meal and while urinating. She is very concerned. Please advise.

## 2023-09-29 NOTE — Telephone Encounter (Signed)
 Yes, she can add imodium up to 8 mg daily in divided doses JMP

## 2023-09-29 NOTE — Telephone Encounter (Signed)
 I have spoken to Amber Bennett to advise she may take imodium up to 8 mg daily in divided doses in addition to her current medications. Following her vacation (she is leaving tomorrow morning), she is to come to our lab to pick up stool studies and return as soon as possible. In addition, she has scheduled colonoscopy for 11/12/23 at 3 pm. She requests Sutab prep. Patient has been advised of time/date/location for upcoming procedure. Written instructions have been made available to the patient for additional review through mychart.

## 2023-09-29 NOTE — Telephone Encounter (Signed)
 Ok, will discuss with patient. Any changes/increases in medication that may help he a bit while she is on her trip? Can she add in imodium?

## 2023-09-30 ENCOUNTER — Ambulatory Visit (INDEPENDENT_AMBULATORY_CARE_PROVIDER_SITE_OTHER)

## 2023-09-30 DIAGNOSIS — R109 Unspecified abdominal pain: Secondary | ICD-10-CM

## 2023-09-30 DIAGNOSIS — R197 Diarrhea, unspecified: Secondary | ICD-10-CM | POA: Diagnosis not present

## 2023-09-30 LAB — HEMOCCULT SLIDES (X 3 CARDS)
Fecal Occult Blood: NEGATIVE
OCCULT 1: NEGATIVE
OCCULT 2: NEGATIVE
OCCULT 3: NEGATIVE
OCCULT 4: NEGATIVE
OCCULT 5: NEGATIVE

## 2023-09-30 NOTE — Telephone Encounter (Signed)
 Patient with diagnosis of afib on Eliquis for anticoagulation.    Procedure: Colonoscopy   Date of procedure: 11/12/2023   CHA2DS2-VASc Score = 4   This indicates a 4.8% annual risk of stroke. The patient's score is based upon: CHF History: 1 HTN History: 1 Diabetes History: 0 Stroke History: 0 Vascular Disease History: 0 Age Score: 1 Gender Score: 1    CrCl 58 mL/min (adj BW)  Platelet count 180 K    Per office protocol, patient can hold Eliquis for 2 days prior to procedure.     **This guidance is not considered finalized until pre-operative APP has relayed final recommendations.**

## 2023-09-30 NOTE — Telephone Encounter (Signed)
 I have spoken to patient to advise that per cardiology, she may hold her eliquis 2 days prior to her scheduled colonoscopy. Patient verbalizes understanding.

## 2023-09-30 NOTE — Telephone Encounter (Signed)
   Patient Name: Amber Bennett  DOB: 1950-11-14 MRN: [id number]  Primary Cardiologist: Thurmon Fair, MD  Chart reviewed as part of pre-operative protocol coverage. Pre-op clearance already addressed by colleagues in earlier phone notes. To summarize recommendations:  -Per office protocol, patient can hold Eliquis for 2 days prior to procedure.  Please resume when medically safe to do so.  No medical clearance was requested.  Will route this bundled recommendation to requesting provider via Epic fax function and remove from pre-op pool. Please call with questions.  Sharlene Dory, PA-C 09/30/2023, 2:00 PM

## 2023-10-05 LAB — FUNGUS CULTURE RESULT

## 2023-10-05 LAB — FUNGUS CULTURE WITH STAIN

## 2023-10-05 LAB — FUNGAL ORGANISM REFLEX

## 2023-10-08 ENCOUNTER — Other Ambulatory Visit

## 2023-10-08 DIAGNOSIS — R197 Diarrhea, unspecified: Secondary | ICD-10-CM | POA: Diagnosis not present

## 2023-10-09 ENCOUNTER — Other Ambulatory Visit: Payer: Self-pay | Admitting: *Deleted

## 2023-10-09 LAB — CLOSTRIDIUM DIFFICILE TOXIN B, QUALITATIVE, REAL-TIME PCR: Toxigenic C. Difficile by PCR: DETECTED — AB

## 2023-10-09 MED ORDER — VANCOMYCIN HCL 125 MG PO CAPS: 125.0000 mg | ORAL_CAPSULE | Freq: Four times a day (QID) | ORAL | 0 refills | Status: AC

## 2023-10-10 LAB — CALPROTECTIN, FECAL: Calprotectin, Fecal: 236 ug/g — ABNORMAL HIGH (ref 0–120)

## 2023-10-13 ENCOUNTER — Telehealth: Payer: Self-pay | Admitting: Pulmonary Disease

## 2023-10-13 ENCOUNTER — Other Ambulatory Visit: Payer: Self-pay | Admitting: Internal Medicine

## 2023-10-13 DIAGNOSIS — B4489 Other forms of aspergillosis: Secondary | ICD-10-CM

## 2023-10-13 DIAGNOSIS — Z1231 Encounter for screening mammogram for malignant neoplasm of breast: Secondary | ICD-10-CM

## 2023-10-13 NOTE — Telephone Encounter (Signed)
 Please let patient know that her BAL culture is growing aspergillus fumigatus which is fungas and can cause pneumonia. I recommend she be evaluated by infectious disease to determine treatment. I have placed referral.  Thanks, JD

## 2023-10-14 NOTE — Telephone Encounter (Signed)
 Lm x1 for patient.

## 2023-10-15 ENCOUNTER — Telehealth: Payer: Self-pay

## 2023-10-15 LAB — CULTURE, BAL-QUANTITATIVE W GRAM STAIN: Culture: <10000 — AB

## 2023-10-15 NOTE — Telephone Encounter (Signed)
 Copied from CRM [id number]. Topic: General - Other >> Oct 15, 2023 12:36 PM Alverda Joe S wrote: Reason for CRM: patient calling to speak with smith, margie, let patient know why she was given a call. Patient is concerned and have questions regarding diagnosis. Please patient would like to speak with smith, margie, requesting for a callback after 4:15 pm.   I called and spoke to pt. Pt informed of Dr Arvin Laundry note for BAL results. Pt states she has had S.O.B and it is a major concern. Pt is using her inhalers. Pt verbalized understanding regarding note and nfn. Pt will call our office if her inhalers are not helping the S.O.B.

## 2023-10-16 NOTE — Telephone Encounter (Signed)
 I have already spoke to pt regarding this. Pt verbalized understanding. NFN

## 2023-10-17 ENCOUNTER — Telehealth: Payer: Self-pay | Admitting: Infectious Disease

## 2023-10-17 NOTE — Telephone Encounter (Signed)
 Amber Bennett reached on behalf of Amber Bennett Mom, Amber Bennett who had aspergillus on culture but does NOT sound like she has infection due tot this.  She is scheduled with Amber Bennett but Amber Bennett wanted him seen by someone he knows which is me and Amber Bennett since Amber Bennett is gone.  None of us  have anything before Amber Bennett appt. Maybe she could be overbooked on an appt where I Have a likely no show or we can creat maybe one earlier Wed clinic somewhere

## 2023-10-23 ENCOUNTER — Encounter: Payer: Self-pay | Admitting: Infectious Disease

## 2023-10-23 DIAGNOSIS — B4489 Other forms of aspergillosis: Secondary | ICD-10-CM | POA: Insufficient documentation

## 2023-10-23 DIAGNOSIS — K7581 Nonalcoholic steatohepatitis (NASH): Secondary | ICD-10-CM | POA: Insufficient documentation

## 2023-10-23 HISTORY — DX: Other forms of aspergillosis: B44.89

## 2023-10-24 ENCOUNTER — Other Ambulatory Visit: Payer: Self-pay

## 2023-10-24 ENCOUNTER — Encounter: Payer: Self-pay | Admitting: Infectious Disease

## 2023-10-24 ENCOUNTER — Ambulatory Visit (INDEPENDENT_AMBULATORY_CARE_PROVIDER_SITE_OTHER): Admitting: Infectious Disease

## 2023-10-24 ENCOUNTER — Other Ambulatory Visit (HOSPITAL_COMMUNITY): Payer: Self-pay

## 2023-10-24 VITALS — BP 156/82 | HR 74 | Temp 97.4°F | Ht 58.5 in | Wt 165.0 lb

## 2023-10-24 DIAGNOSIS — K7581 Nonalcoholic steatohepatitis (NASH): Secondary | ICD-10-CM

## 2023-10-24 DIAGNOSIS — B441 Other pulmonary aspergillosis: Secondary | ICD-10-CM

## 2023-10-24 DIAGNOSIS — J479 Bronchiectasis, uncomplicated: Secondary | ICD-10-CM

## 2023-10-24 DIAGNOSIS — I480 Paroxysmal atrial fibrillation: Secondary | ICD-10-CM

## 2023-10-24 DIAGNOSIS — B4489 Other forms of aspergillosis: Secondary | ICD-10-CM

## 2023-10-24 DIAGNOSIS — R918 Other nonspecific abnormal finding of lung field: Secondary | ICD-10-CM

## 2023-10-24 DIAGNOSIS — K529 Noninfective gastroenteritis and colitis, unspecified: Secondary | ICD-10-CM

## 2023-10-24 DIAGNOSIS — R197 Diarrhea, unspecified: Secondary | ICD-10-CM | POA: Diagnosis not present

## 2023-10-24 DIAGNOSIS — R0609 Other forms of dyspnea: Secondary | ICD-10-CM | POA: Diagnosis not present

## 2023-10-24 DIAGNOSIS — M3313 Other dermatomyositis without myopathy: Secondary | ICD-10-CM

## 2023-10-24 DIAGNOSIS — Z114 Encounter for screening for human immunodeficiency virus [HIV]: Secondary | ICD-10-CM

## 2023-10-24 DIAGNOSIS — Z221 Carrier of other intestinal infectious diseases: Secondary | ICD-10-CM

## 2023-10-24 DIAGNOSIS — I509 Heart failure, unspecified: Secondary | ICD-10-CM

## 2023-10-24 HISTORY — DX: Noninfective gastroenteritis and colitis, unspecified: K52.9

## 2023-10-24 NOTE — Progress Notes (Signed)
 Reason for infectious disease consult: Aspergillus fumigatus isolated on bronchoscopy  Requesting Physician: Duaine German, MD  PCP: Aldo Hun, MD  Subjective:    Patient ID: Amber Bennett, female    DOB: 01/25/1951, 73 y.o.   MRN: [id number]  HPI  73 year old with former smoker (quit at age 29) seronegative rheumatoid arthritis dermatomyositis, followed by [name] and on Plaquenil  and prednisone (currently being held) also with a seizure disorder that is currently managed with phenobarbital , chronic diarrhea, who apparently had pneumonia after her total hip replacement with [name] in 2023. She also has  history of PAF and CHF having undergone an ablation for the former when it was thought to be driving her dyspnea. She does have some other confounders with recent weight gain from 140 to 167 # and diagnosis of OSA and I would query whether there might be an obesity hypoventilation syndrome restrictive lung disease (which was seen on PFTS) from the weight gain.   I have reviewed imaging available prior to 2023 including a CT coronary study done in 2019.  This study seemed completely normal with regards to the lung parenchyma.   She then underwent THR by Dr. Lucienne Ryder in early August 2023.  He then had developed some dyspnea on exertion as well as coughing with phlegm production.  Was concerns for PE and a CT angiogram was ordered on February 18, 2022  CTA on [id number] showed:  IMPRESSION: 1. No evidence of pulmonary embolus. 2. Mild right upper lobe consolidation with surrounding ground-glass, likely infectious or inflammatory. Recommend follow-up chest CT in 3 months to ensure resolution. 3. Aortic Atherosclerosis (ICD10-I70.0).   She appears to have been prescribed a 5 day course of levaquin at this point in time.  He says that her symptoms improved in particular she had been having phlegm that she was producing and this did improve with the levofloxacin.  However the  dyspnea on exertion remained.  Continues  struggle with dyspnea on exertion ever since then.  She has struggled with recurrent sinus infections which have typically responded to antibacterial antibiotics.  She however is continue to have trouble with the dyspnea on exertion.  She has a CPAP machine at home which at one point was not fitting correctly and which was causing some hypoxia based on her home sleep study.  This has been remedied.  She does however suffer from sneezing when she wears the CPAP religiously has fairly uncontrolled sneezing at least once per week.  She has seen Dr. Kozlow from Rheumatology who noted that her symptoms of DOE actually do NOT manifest themselves when she does aquatic exercises which again raises ? Could the weight and potential obesity ventilation by the issue and getting rid of some gravity helps here?   CT lung [id number] showed  IMPRESSION: Minimal ground-glass opacities in the right upper lung may represent scarring from prior inflammatory changes in this area.  She then had a CT scan  t in January 30th of 2025 showed:  MPRESSION: 1. New clustered heterogeneous airspace opacity and nodularity in the inferior right upper lobe measuring 2.4 x 1.6 cm. Additional tiny nodules and small consolidations in the medial left upper lobe and in the right middle lobe. Findings are most consistent with multifocal atypical infection. 2. Mild, bland appearing scarring of the lung bases. No evidence of fibrotic interstitial lung disease. 3. Hepatic steatosis. Coarse contour of the liver, suggestive of cirrhosis. 4. Subacute, partially callused fracture of the anterior right fifth rib.  She had been on cefdnir prior to that scan but I am not sure what antibacterial antibiotic she received after that.  CT in March 2025 showed:   IMPRESSION: 1. Interval resolution of airspace consolidation within the basilar right upper lobe. In the area of the previous  consolidative change there is a cluster of lung nodules which measure up to 5 mm. The clustered appearance of these nodules strongly suggest sequelae of an inflammatory or infectious process. Most likely chronic atypical infection such as MAI 2. Interval resolution of clustered nodularity within the anteromedial left upper lobe. 3. Stable appearance of peripheral bronchiolectasis with peripheral clustered tree-in-bud nodularity within the right middle lobe and posterior lingula. Findings are also favored to represent sequelae of chronic atypical mycobacterial infection. 4. Stable 5 mm peripheral subpleural nodule in the posterolateral right middle lobe. 5. Coronary artery calcifications noted. 6.  Aortic Atherosclerosis (ICD10-I70.0).  She underwent bronchoscopy with BAL with Dr. Diania Fortes on [phone]  He found    - Scant thick clear mucous scattered throughout both bronchial trees - Possible excessive dynamic airway collapse - Mild erythema of the RML and Lingula entrances - No endobronchial lesions noted   BAL were sent for cultures and on fungal cultures from 2 BAL specimens were Aspergillus fumigatus was isolated.   She more recently was rx ciprofloxacin  and flagyl  and later augmentin, in this case for possible parotitis/sinusitis with worsening of her frequency of loose stools. Her baseline is to have about 8 loose bowel movements per day.[name] She was seen by Dr. Bridgett Camps who sent C difficile PCR on stool that was + though I dont see that a C difficile toxin was sent. She has now been placed on vancomycin  orally.Her symptoms have not improved on vancomycin .  I am skeptical that C difficile is driving pathology in her. I would think that the augmentin more likely just in itself caused worsening of her chronic diarrhea rather than causing c difficile colitis.  As of her weight she had success with Ozempic  and Mounjaro  but then when she was no longer diabetic the medication was stopped and  she regained her weight she is about to go on a new drug to help with this and she certainly has multiple indications for need for weight loss drug including NASH, obstructive sleep apnea process possible obesity hypoventilation syndrome and diabetes.        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   Carpal tunnel syndrome    Cervical radiculitis    Collar bone fracture    Depression    Dermatomyositis (HCC)    Diabetes mellitus without complication (HCC)    Dysrhythmia    Elevated liver enzymes    External hemorrhoids    Fatty liver    Fibromyalgia    Fibromyalgia affecting hand 08/2022   bilateral wrist and hands   Hematuria    negative evaluation   High cholesterol    HTN (hypertension)    Internal hemorrhoids    Laryngitis    Lower back pain    Macular degeneration    Nonalcoholic steatohepatitis (NASH)    Nonalcoholic steatohepatitis (NASH)    Osteopenia    Left side hip   Seizures (HCC)    09/07/2019 pt reports last seizure in 2014   Tubular adenoma of colon  Past Surgical History:  Procedure Laterality Date   ABDOMINAL HYSTERECTOMY  1982   ADENOIDECTOMY     APPENDECTOMY     ATRIAL FIBRILLATION ABLATION N/A 03/13/2023   Procedure: ATRIAL FIBRILLATION ABLATION;  Surgeon: Lei Pump, MD;  Location: [facility];  Service: Cardiovascular;  Laterality: N/A;   BACK SURGERY     BACK SURGERY  07/2015   BRONCHIAL WASHINGS  09/19/2023   Procedure: IRRIGATION, BRONCHUS;  Surgeon: Wilfredo Hanly, MD;  Location: [facility];  Service: Pulmonary;;   CARPAL TUNNEL RELEASE     CATARACT EXTRACTION, BILATERAL     CHOLECYSTECTOMY  2003   COLONOSCOPY     EXPLORATORY LAPAROTOMY     FLEXIBLE BRONCHOSCOPY Bilateral 09/19/2023   Procedure: BRONCHOSCOPY, FLEXIBLE;  Surgeon: Wilfredo Hanly, MD;  Location: [facility]  ENDOSCOPY;  Service: Pulmonary;  Laterality: Bilateral;  bronchoscopy for BAL   LAMINECTOMY     SHOULDER SURGERY Right 2000   SINOSCOPY     TONSILLECTOMY     TOTAL HIP ARTHROPLASTY Right 02/12/2022   Procedure: RIGHT TOTAL HIP ARTHROPLASTY ANTERIOR APPROACH;  Surgeon: Arnie Lao, MD;  Location: [facility] OR;  Service: Orthopedics;  Laterality: Right;    Family History  Problem Relation Age of Onset   Cancer Mother        Cancer of the bone marrow   Heart attack Mother    Stroke Sister    Hypertension Sister    Diabetes Sister    Colon cancer Maternal Aunt    Seizures Neg Hx    Esophageal cancer Neg Hx    Rectal cancer Neg Hx    Stomach cancer Neg Hx       Social History   Socioeconomic History   Marital status: Married    Spouse name: Not on file   Number of children: 2   Years of education: 14   Highest education level: Not on file  Occupational History   Occupation: Employed    Comment: Works for [employer], [place] and [employer]  Tobacco Use   Smoking status: Former    Current packs/day: 0.00    Types: Cigarettes    Quit date: 07/01/1978    Years since quitting: 45.3   Smokeless tobacco: Never   Tobacco comments:    Former smoker 04/10/23  Vaping Use   Vaping status: Never Used  Substance and Sexual Activity   Alcohol  use: Yes    Alcohol /week: 7.0 standard drinks of alcohol     Types: 7 Glasses of wine per week    Comment: 1 glass of white wine nightly 04/10/23   Drug use: No   Sexual activity: Yes    Partners: Male    Birth control/protection: Surgical    Comment: TAH  Other Topics Concern   Not on file  Social History Narrative   Patient lives at home with her husband Loretta Romp).   Retired.   Education two years of business college.   Right handed.   Caffeine     Social Drivers of Corporate investment banker Strain: Low Risk  (06/17/2018)   Received from [facility], [facility]   Overall Financial Resource  Strain (CARDIA)    Difficulty of Paying Living Expenses: Not hard at all  Food Insecurity: No Food Insecurity (06/17/2018)   Received from [facility], [facility]   Hunger Vital Sign    Worried About Running Out of Food in the Last Year: Never true  Ran Out of Food in the Last Year: Never true  Transportation Needs: No Transportation Needs (06/17/2018)   Received from [facility], [facility]   [facility] - Transportation    In the past 12 months, has lack of transportation kept you from medical appointments or from getting medications?: No    Lack of Transportation (Non-Medical): No  Physical Activity: Not on file  Stress: No Stress Concern Present (06/17/2018)   Received from [facility], [facility]   [employer] of Occupational Health - Occupational Stress Questionnaire    Feeling of Stress : Only a little  Social Connections: Unknown (06/17/2018)   Received from [facility], [facility]   Social Connection and Isolation Panel [NHANES]    Frequency of Communication with Friends and Family: Not on file    Frequency of Social Gatherings with Friends and Family: Not on file    Attends Religious Services: Not on file    Active Member of Clubs or Organizations: Not on file    Attends Banker Meetings: Not on file    Marital Status: Married    Allergies  Allergen Reactions   Tylenol  [Acetaminophen ] Swelling and Other (See Comments)    Throat swelling, anaphylaxis   Aspirin  Other (See Comments)    gi bleeding in 2005   Cefdinir Other (See Comments)    Other Reaction(s): hives, itching   Colesevelam Other (See Comments)    Other Reaction(s): has never tried it but would not be a good choice as all seizure meds would have to be taken at least four hours prior to the dose and with her regimen that just won't be possible.    Ezetimibe -Simvastatin Other (See Comments)    Other Reaction(s): elevated LFT   Penicillin G Benzathine Itching   Rosuvastatin     Other Reaction(s): skin rash and myalgia   Wound Dressing Adhesive Other (See Comments)   Latex Itching    Rash      Current Outpatient Medications:    Alirocumab  (PRALUENT ) 150 MG/ML SOAJ, Inject 1 mL (150 mg total) into the skin every 14 (fourteen) days., Disp: 6 mL, Rfl: 3   amLODipine  (NORVASC ) 2.5 MG tablet, Take 1 tablet (2.5 mg total) by mouth at bedtime., Disp: 90 tablet, Rfl: 3   apixaban  (ELIQUIS ) 5 MG TABS tablet, Take 1 tablet (5 mg total) by mouth 2 (two) times daily., Disp: 180 tablet, Rfl: 1   Ascorbic Acid (VITAMIN C) 1000 MG tablet, Take 1,000 mg by mouth daily. Airborne emergen c, Disp: , Rfl:    beclomethasone (QVAR  REDIHALER) 80 MCG/ACT inhaler, Inhale 1 puff into the lungs 2 (two) times daily., Disp: 31.8 g, Rfl: 1   Calcium Carb-Cholecalciferol  (CALTRATE 600+D3 PO), Take 1 tablet by mouth daily., Disp: , Rfl:    Cholecalciferol  (VITAMIN D ) 50 MCG (2000 UT) tablet, Take 2,000 Units by mouth daily., Disp: , Rfl:    Coenzyme Q10 (CO Q 10 PO), Take 200 mg by mouth daily., Disp: , Rfl:    colestipol  (COLESTID ) 1 g tablet, Take 2 tablets (2 g total) by mouth 2 (two) times daily., Disp: 180 tablet, Rfl: 0   cyclobenzaprine  (FLEXERIL ) 5 MG tablet, Take 1 tablet (5 mg total) by mouth every 8 (eight) hours as needed., Disp: 30 tablet, Rfl: 1   diphenoxylate -atropine  (LOMOTIL ) 2.5-0.025 MG tablet, Take 1-2 tablets by mouth every 4 hours as needed for diarrhea/loose stools, Disp: 90 tablet, Rfl:  1   DULoxetine  (CYMBALTA ) 30 MG capsule, Take 1 capsule (30 mg total) by mouth every evening., Disp: 90 capsule, Rfl: 4   ezetimibe  (ZETIA ) 10 MG tablet, Take 10 mg by mouth every evening., Disp: , Rfl:    furosemide  (LASIX ) 20 MG tablet, Take 2 tablets TWICE DAILY until your weight is less than 159 pounds.  Once your weight is less than 159 pounds, take only 1  tablet daily, Disp: 180 tablet, Rfl: 2   gabapentin  (NEURONTIN ) 800 MG tablet, Take 1 tablet (800 mg total) by mouth 2 (two) times daily., Disp: 180 tablet, Rfl: 3   ibandronate (BONIVA) 150 MG tablet, Take 150 mg by mouth every 30 (thirty) days., Disp: , Rfl:    ipratropium (ATROVENT ) 0.03 % nasal spray, Place 2 sprays into both nostrils every 12 (twelve) hours., Disp: 30 mL, Rfl: 4   ipratropium-albuterol  (DUONEB) 0.5-2.5 (3) MG/3ML SOLN, Take 3 mLs by nebulization 3 (three) times daily as needed., Disp: 360 mL, Rfl: 5   loratadine  (CLARITIN ) 10 MG tablet, Take 1 tablet (10 mg total) by mouth daily as needed for allergies (Can take an extra dose during flare ups.)., Disp: 90 tablet, Rfl: 1   metoprolol  tartrate (LOPRESSOR ) 50 MG tablet, Take 1 tablet (50 mg total) by mouth 2 (two) times daily., Disp: , Rfl:    mometasone  (ASMANEX ) 220 MCG/ACT inhaler, Inhale 1 puff into the lungs 2 (two) times daily., Disp: 3 each, Rfl: 1   montelukast  (SINGULAIR ) 10 MG tablet, Take 10 mg by mouth at bedtime. (Patient not taking: Reported on 08/27/2023), Disp: , Rfl:    Multiple Vitamins-Minerals (MULTIVITAMIN PO), Take 1 tablet by mouth daily., Disp: , Rfl:    Multiple Vitamins-Minerals (PRESERVISION AREDS PO), Take 1 capsule by mouth daily., Disp: , Rfl:    Olopatadine-Mometasone  (RYALTRIS ) 665-25 MCG/ACT SUSP, Place 2 sprays into the nose 2 (two) times daily., Disp: 87 g, Rfl: 1   omega-3 acid ethyl esters (LOVAZA ) 1 g capsule, Take 2 capsules (2 g total) by mouth 2 (two) times daily., Disp: 360 capsule, Rfl: 3   omeprazole (PRILOSEC) 20 MG capsule, Take 20 mg by mouth 2 (two) times daily before a meal., Disp: , Rfl: 3   PHENobarbital  (LUMINAL) 97.2 MG tablet, TAKE 1 AND 1/2 TABLETS BY MOUTH EVERY DAY, Disp: 135 tablet, Rfl: 2   potassium chloride  SA (KLOR-CON  M) 20 MEQ tablet, While taking 40 mg of Furosemide , take Potassium Chloride  20 meq twice a day. When you decrease Furosemide  to 20 mg, decrease Potassium  Chloride to 20 meq once a day., Disp: 180 tablet, Rfl: 3   PRALUENT  150 MG/ML SOAJ, Inject 150 mg into the skin every 14 (fourteen) days. (Patient not taking: Reported on 08/27/2023), Disp: , Rfl:    pyridoxine  (B-6) 100 MG tablet, Take 100 mg by mouth daily., Disp: , Rfl:    telmisartan (MICARDIS) 40 MG tablet, Take 40 mg by mouth daily., Disp: , Rfl:    topiramate  (TOPAMAX ) 50 MG tablet, 1 tablet (50 mg total) in the morning AND 2 tablets (100 mg total) every evening., Disp: 270 tablet, Rfl: 2   vitamin E 400 UNIT capsule, Take 400 Units by mouth daily., Disp: , Rfl:    Review of Systems  Constitutional:  Positive for unexpected weight change. Negative for activity change, appetite change, chills, diaphoresis, fatigue and fever.  HENT:  Negative for congestion, rhinorrhea, sinus pressure, sneezing, sore throat and trouble swallowing.   Eyes:  Negative for photophobia and visual  disturbance.  Respiratory:  Positive for shortness of breath. Negative for cough, chest tightness, wheezing and stridor.   Cardiovascular:  Negative for chest pain, palpitations and leg swelling.  Gastrointestinal:  Positive for diarrhea. Negative for abdominal distention, abdominal pain, anal bleeding, blood in stool, constipation, nausea and vomiting.  Genitourinary:  Negative for difficulty urinating, dysuria, flank pain and hematuria.  Musculoskeletal:  Negative for arthralgias, back pain, gait problem, joint swelling and myalgias.  Skin:  Negative for color change, pallor, rash and wound.  Neurological:  Negative for dizziness, tremors, weakness and light-headedness.  Hematological:  Negative for adenopathy. Does not bruise/bleed easily.  Psychiatric/Behavioral:  Negative for agitation, behavioral problems, confusion, decreased concentration, dysphoric mood and sleep disturbance.        Objective:   Physical Exam Constitutional:      General: She is not in acute distress.    Appearance: Normal appearance. She  is well-developed. She is not ill-appearing or diaphoretic.  HENT:     Head: Normocephalic and atraumatic.     Right Ear: Hearing and external ear normal.     Left Ear: Hearing and external ear normal.     Nose: No nasal deformity or rhinorrhea.  Eyes:     General: No scleral icterus.    Conjunctiva/sclera: Conjunctivae normal.     Right eye: Right conjunctiva is not injected.     Left eye: Left conjunctiva is not injected.     Pupils: Pupils are equal, round, and reactive to light.  Neck:     Vascular: No JVD.  Cardiovascular:     Rate and Rhythm: Normal rate and regular rhythm.     Heart sounds: Normal heart sounds, S1 normal and S2 normal. No murmur heard.    No friction rub. No gallop.  Pulmonary:     Effort: Pulmonary effort is normal. Prolonged expiration present. No respiratory distress.     Breath sounds: Normal breath sounds. No stridor. No wheezing or rhonchi.  Abdominal:     General: There is no distension.     Palpations: Abdomen is soft.  Musculoskeletal:        General: Normal range of motion.     Right shoulder: Normal.     Left shoulder: Normal.     Cervical back: Normal range of motion and neck supple.     Right hip: Normal.     Left hip: Normal.     Right knee: Normal.     Left knee: Normal.  Lymphadenopathy:     Head:     Right side of head: No submandibular, preauricular or posterior auricular adenopathy.     Left side of head: No submandibular, preauricular or posterior auricular adenopathy.     Cervical: No cervical adenopathy.     Right cervical: No superficial or deep cervical adenopathy.    Left cervical: No superficial or deep cervical adenopathy.  Skin:    General: Skin is warm and dry.     Coloration: Skin is not pale.     Findings: No abrasion, bruising, ecchymosis, erythema, lesion or rash.     Nails: There is no clubbing.  Neurological:     Mental Status: She is alert and oriented to person, place, and time.     Sensory: No sensory  deficit.     Coordination: Coordination normal.     Gait: Gait normal.  Psychiatric:        Attention and Perception: She is attentive.        Mood and  Affect: Mood normal.        Speech: Speech normal.        Behavior: Behavior normal. Behavior is cooperative.        Thought Content: Thought content normal.        Judgment: Judgment normal.           Assessment & Plan:   Aspergillus on BAL:  I do not find that her clinical picture fits well for Aspergillus infection.  Certainly there is no way that she has invasive aspergillosis.  She is not sufficiently immunocompromised to see that type of pathology and certainly the findings on CT scan would not be waxing and waning.  She appears to have bronchiectasis based on recent CT scans though it is interesting that bronchiectasis was never raised in 2019.  Certainly people who have bronchiectasis are at risk of getting colonized with Aspergillus and I think that is certainly the case here (though other explanations for the Aspergillus being in the BAL could be contamination I think it is likely a true positive culture given it was in 2 BALs and with the bronchiectasis she would have the right phenotype to be colonized with it)  Typically when we see it cause problems and bronchiectatic patients it is in the form of an aspergilloma or fungus ball.  Patients with this typically present with cough and frequently hemoptysis.  She has problems with sinus infections and at times has some phlegm produced then but does not have much in the way of coughing is one of her symptoms.  Allergic bronchopulmonary aspergillosis would also be in the differential but I do not think that her scenario fits well for that either.  For thoroughness I will check an Aspergillus antibody Fungitell galactomannan sed rate CRP CMP and CBC with differential.  Also reach out to Arminda Landmark at [facility] who is and then a [facility] my[facility] to see if he  has any additional thoughts  Note if we do give her antifungal therapy current first-line drug such as voriconazole and Cresemba would be contraindicated with her phenobarbital .  It looks like posaconazole would be okay in terms of drug-drug interactions but we would likely have to get prior authorization.  Resident when not convinced that Aspergillus is playing a role in her pathology I am loath to try to get a drug that requires prior authorization or ask her to rearrange her antiepileptic medications when her she has been seizure-free for many years with this regimen.    #2 Dyspnea on exertion: there would seem to be multiple other causeas here including possible cardiac cause, obesity hypoventilation, deconditioning in context of THR, more recently clavicle fracture  She is going to see Dr. Bertha Broad soon and I have asked her to bring up issue of her DOE with him  I think weight loss and improved exercise perhaps with aquatic exercises MIGHt be something that gives her signficant improvement  #3 Bronchiectasis:  Is that this is now showing up on imaging when it was not mentioned before her smoking is quite remote she does not have [facility] syndrome with Mycobacterium AVM causing this pathology.  Certainly Aspergillus does not itself cause bronchiectasis.  She will be following up with Dr. Diania Fortes shortly  #4  Waxing waning infiltrates, nodules on CT scans: If these were indeed due to Aspergillus this should not have improved with antibiotics and time and absent antifungal therapy.  Would suspect that these waxing waning infiltrates are related to potential  viral infections and/or bacterial infections.  She certainly has had multiple problems with sinus infections that could also potentially lead to pneumonia as well  #5 PAF: she has been atrial fibrillation since the ablation but is not stopped her dyspnea on exertion.    #6 C. difficile PCR positive on stool: I am skeptical that  she has C. difficile colitis I am in general as in principle skeptical of people who have chronic diarrhea having a test for C. difficile in particular if the test is a C. difficile PCR as this test cannot distinguish between colonization and infection.  I think that she more likely had worsening diarrhea in the context of Augmentin which is known to cause diarrhea when superimposed on her chronic diarrhea.  Certainly if she has checked again a toxin antigen test should be sent as the first test to be sent and then PCR testing could be done if the former testing is inconclusive.  Given her colonization though she should avoid high risk antibiotics such as fluoroquinolones and clindamycin as well as third-generation cephalosporins when possible  I have personally spent 84 minutes involved in face-to-face and non-face-to-face activities for this patient on the day of the visit. Professional time spent includes the following activities: Preparing to see the patient (review of tests), Obtaining and/or reviewing separately obtained history (admission/discharge record), Performing a medically appropriate examination and/or evaluation , Ordering medications/tests/procedures, referring and communicating with other health care professionals, Documenting clinical information in the EMR, Independently interpreting results (not separately reported), Communicating results to the patient/family/caregiver, Counseling and educating the patient/family/caregiver and Care coordination (not separately reported).

## 2023-10-24 NOTE — Patient Instructions (Signed)
 Amber Bennett

## 2023-10-28 ENCOUNTER — Telehealth: Payer: Self-pay | Admitting: Internal Medicine

## 2023-10-28 NOTE — Telephone Encounter (Signed)
 Patient requests to know what kind of C difficile testing she had. She is advised that she had Clostridium difficile Toxin B, Qualitative, Real-Time PCR. She says that Dr Ernie Heal told her that she should have specific testing completed to differentiate active infection vs colonization. States that she would like to know if she can have repeat testing completed before her appointment on Tuesday as she has been avoiding outings and having great grandchildren over in fear she is still contagious. Patient says that following vancomycin , she continues to have multiple diarrheal bowel movements daily (counted 7+ yesterday). Continues to have frequency although possibly somewhat better.  Please advise.Aaron AasAaron Aas

## 2023-10-28 NOTE — Telephone Encounter (Signed)
 Spoke with patient to advise that Dr Bridgett Camps would like to hold off on repeat C difficile testing at this time but would instead have her to schedule flexible sigmoidoscopy in [facility] to exlude microscopic colitis if she continues to have diarrheal symptoms. Advised she should keep her appointment with T.Garrett, PA-C on 11/04/23 and could schedule flexible sigmoidoscopy at that time if found appropriate. We discussed completing the entire vancomycin  course for what Dr Bridgett Camps feels was a true infection given recent negative testing previously. Patient states she finished her course 2 days ago. Discussed using extreme caution with antibiotics in the future unless absolutely necessary since she is at higher risk for c diff recurrence. Patient verbalizes understanding. She is concerned about her great grandchildren coming to her home as scheduled on 11/08/23 and is also concerned about aquatic exercises if she is still infectious. I advised that she is likely no longer contagious following full vancomycin  course and should be ok to complete normal activities at this time as long as she continues appropriate hand hygiene.

## 2023-10-28 NOTE — Telephone Encounter (Signed)
 Patient called and stated that she was wanting to know if her C-Diff test ever include a toxin panel. Patient stated that she is requesting a call back. Please advise.

## 2023-10-28 NOTE — Telephone Encounter (Signed)
 Left message for pt that she did have cdiff toxin testing done.

## 2023-10-28 NOTE — Telephone Encounter (Signed)
 Please reassure patient, also spoke to the patient's son Dr. Jeana Michaels by phone She had a C. difficile toxigenic PCR positive so I do think this infection was real in the setting of her Augmentin and symptoms ; this test had been negative 2 times before this year which argues against colonization alone  I would have her complete the vancomycin  course Be cautious with other antibiotics unless absolutely needed If diarrhea continues posttreatment I would recommend flexible sigmoidoscopy in the [facility] to exclude microscopic colitis And microscopic colitis ultimately excluded we will try pancreatic enzymes

## 2023-10-30 ENCOUNTER — Ambulatory Visit: Admitting: Infectious Diseases

## 2023-10-30 LAB — COMPLETE METABOLIC PANEL WITHOUT GFR
AG Ratio: 1.5 (calc) (ref 1.0–2.5)
ALT: 34 U/L — ABNORMAL HIGH (ref 6–29)
AST: 35 U/L (ref 10–35)
Albumin: 4.5 g/dL (ref 3.6–5.1)
Alkaline phosphatase (APISO): 94 U/L (ref 37–153)
BUN/Creatinine Ratio: 33 (calc) — ABNORMAL HIGH (ref 6–22)
BUN: 16 mg/dL (ref 7–25)
CO2: 26 mmol/L (ref 20–32)
Calcium: 9.4 mg/dL (ref 8.6–10.4)
Chloride: 103 mmol/L (ref 98–110)
Creat: 0.49 mg/dL — ABNORMAL LOW (ref 0.60–1.00)
Globulin: 3.1 g/dL (ref 1.9–3.7)
Glucose, Bld: 93 mg/dL (ref 65–99)
Potassium: 4.4 mmol/L (ref 3.5–5.3)
Sodium: 140 mmol/L (ref 135–146)
Total Bilirubin: 0.6 mg/dL (ref 0.2–1.2)
Total Protein: 7.6 g/dL (ref 6.1–8.1)

## 2023-10-30 LAB — CBC WITH DIFFERENTIAL/PLATELET
Absolute Lymphocytes: 1992 {cells}/uL (ref 850–3900)
Absolute Monocytes: 582 {cells}/uL (ref 200–950)
Basophils Absolute: 48 {cells}/uL (ref 0–200)
Basophils Relative: 0.8 %
Eosinophils Absolute: 198 {cells}/uL (ref 15–500)
Eosinophils Relative: 3.3 %
HCT: 40.8 % (ref 35.0–45.0)
Hemoglobin: 13.9 g/dL (ref 11.7–15.5)
MCH: 32.5 pg (ref 27.0–33.0)
MCHC: 34.1 g/dL (ref 32.0–36.0)
MCV: 95.3 fL (ref 80.0–100.0)
MPV: 9.9 fL (ref 7.5–12.5)
Monocytes Relative: 9.7 %
Neutro Abs: 3180 {cells}/uL (ref 1500–7800)
Neutrophils Relative %: 53 %
Platelets: 184 10*3/uL (ref 140–400)
RBC: 4.28 10*6/uL (ref 3.80–5.10)
RDW: 11 % (ref 11.0–15.0)
Total Lymphocyte: 33.2 %
WBC: 6 10*3/uL (ref 3.8–10.8)

## 2023-10-30 LAB — FUNGITELL (1-3)-B-D-GLUCAN
(1-3)-B-D-Glucan: <31 pg/mL (ref <60)
Interpretation: NEGATIVE

## 2023-10-30 LAB — C-REACTIVE PROTEIN: CRP: 5.6 mg/L (ref <8.0)

## 2023-10-30 LAB — SEDIMENTATION RATE: Sed Rate: 14 mm/h (ref 0–30)

## 2023-10-30 LAB — ASPERGILLUS ANTIBODY BY IMMUNODIFF: Aspergillus Antibody ID: NEGATIVE

## 2023-10-31 ENCOUNTER — Ambulatory Visit: Admitting: Infectious Disease

## 2023-11-02 NOTE — Progress Notes (Signed)
 Amber Bennett, can Amber Bennett please get an echo for shortness of breath between now and her July appointment with me? Thanks

## 2023-11-03 ENCOUNTER — Other Ambulatory Visit: Payer: Self-pay | Admitting: Cardiovascular Disease

## 2023-11-03 LAB — ACID FAST CULTURE WITH REFLEXED SENSITIVITIES (MYCOBACTERIA)
Acid Fast Culture: NEGATIVE
Acid Fast Culture: NEGATIVE

## 2023-11-04 ENCOUNTER — Encounter: Payer: Self-pay | Admitting: Physician Assistant

## 2023-11-04 ENCOUNTER — Telehealth: Payer: Self-pay | Admitting: Emergency Medicine

## 2023-11-04 ENCOUNTER — Ambulatory Visit (INDEPENDENT_AMBULATORY_CARE_PROVIDER_SITE_OTHER): Admitting: Physician Assistant

## 2023-11-04 VITALS — BP 130/62 | HR 70 | Ht <= 58 in | Wt 163.8 lb

## 2023-11-04 DIAGNOSIS — K580 Irritable bowel syndrome with diarrhea: Secondary | ICD-10-CM

## 2023-11-04 DIAGNOSIS — A498 Other bacterial infections of unspecified site: Secondary | ICD-10-CM

## 2023-11-04 DIAGNOSIS — E871 Hypo-osmolality and hyponatremia: Secondary | ICD-10-CM | POA: Diagnosis not present

## 2023-11-04 DIAGNOSIS — Z79899 Other long term (current) drug therapy: Secondary | ICD-10-CM | POA: Diagnosis not present

## 2023-11-04 DIAGNOSIS — N179 Acute kidney failure, unspecified: Secondary | ICD-10-CM | POA: Diagnosis not present

## 2023-11-04 DIAGNOSIS — D649 Anemia, unspecified: Secondary | ICD-10-CM | POA: Diagnosis not present

## 2023-11-04 DIAGNOSIS — E785 Hyperlipidemia, unspecified: Secondary | ICD-10-CM | POA: Diagnosis not present

## 2023-11-04 DIAGNOSIS — K219 Gastro-esophageal reflux disease without esophagitis: Secondary | ICD-10-CM | POA: Diagnosis not present

## 2023-11-04 DIAGNOSIS — R7301 Impaired fasting glucose: Secondary | ICD-10-CM | POA: Diagnosis not present

## 2023-11-04 DIAGNOSIS — M810 Age-related osteoporosis without current pathological fracture: Secondary | ICD-10-CM | POA: Diagnosis not present

## 2023-11-04 DIAGNOSIS — Z1212 Encounter for screening for malignant neoplasm of rectum: Secondary | ICD-10-CM | POA: Diagnosis not present

## 2023-11-04 DIAGNOSIS — I4892 Unspecified atrial flutter: Secondary | ICD-10-CM | POA: Diagnosis not present

## 2023-11-04 DIAGNOSIS — R0602 Shortness of breath: Secondary | ICD-10-CM

## 2023-11-04 DIAGNOSIS — I110 Hypertensive heart disease with heart failure: Secondary | ICD-10-CM | POA: Diagnosis not present

## 2023-11-04 NOTE — Progress Notes (Signed)
 se     Amber Canard, PA-C [address] [place], [place]  [address] Phone: [phone]   Primary Care Physician: Aldo Hun, MD  Primary Gastroenterologist:  Amber Canard, PA-C / Dr. Laurell Pond   Chief Complaint: Follow-up C. difficile diarrhea and chronic diarrhea.       HPI:   Amber Bennett is a 73 y.o. female returns for follow-up of C. difficile.  She last saw Dr. Bridgett Camps for diarrhea 07/17/2023.  GI history significant for GERD, hepatic steatosis, adenomatous colon polyps, IBS-D, hemorrhoids.  She has taken Xifaxan, colestipol , and Lomotil  to help her IBS diarrhea in the past.  Takes omeprazole 20 Mg twice daily for GERD.  She stopped taking magnesium supplement due to her diarrhea.  She recently saw infectious disease specialist Dr. Fontaine Ice 10/24/23 and pulmonologist Dr. Diania Fortes for Aspergillus fumigatus found on fungal cultures on bronchoscopy.  Treated with ciprofloxacin , Flagyl , and Augmentin in the past few months for pneumonia, UTI, and sinusitis.  She has taken multiple antibiotics in the past 6 months.  10/08/23: C. difficile toxin PCR was positive.  Treated with vancomycin  125 mg 4 times daily x 14 days.  Current symptoms: She finished vancomycin  1 week ago.  She is no longer having diarrhea.  Currently having 2 or 3 formed stools daily which are a lot less frequent.  Before starting vancomycin , she was having 7-9 loose and watery stools daily.  Currently feeling a lot better.  No abdominal pain, rectal bleeding, or other GI symptoms.  09/03/2023: C. difficile toxin PCR was negative.  06/10/2023: C. difficile toxin PCR was negative.  Ova, parasite, and stool culture were negative.  Fecal lactoferrin was positive.  08/2019 colonoscopy by Dr. Bridgett Camps: Medium internal and external hemorrhoids, 1 small 3 mm tubular adenoma and 1 small hyperplastic polyp removed.  Good prep.  Biopsies negative for microscopic colitis.  7-year repeat.  08/2022 EGD by Dr. Bridgett Camps: 1 cm hiatal  hernia.  Nonobstructing Schatzki's ring at the GE junction dilated to 52 FR.  Stomach and duodenum normal.  No biopsies.  PMH: Currently on Eliquis , history of rheumatoid arthritis dermatomyositis, former smoker (quit age 40), history of seizure disorder, history of chronic diarrhea, paroxysmal atrial fibrillation, CHF.     Current Outpatient Medications  Medication Sig Dispense Refill   Alirocumab  (PRALUENT ) 150 MG/ML SOAJ Inject 1 mL (150 mg total) into the skin every 14 (fourteen) days. 6 mL 3   amiodarone  (PACERONE ) 200 MG tablet Take 1 tablet (200 mg total) by mouth daily. 90 tablet 1   amLODipine  (NORVASC ) 2.5 MG tablet Take 1 tablet (2.5 mg total) by mouth at bedtime. 90 tablet 3   apixaban  (ELIQUIS ) 5 MG TABS tablet Take 1 tablet (5 mg total) by mouth 2 (two) times daily. 180 tablet 1   Ascorbic Acid (VITAMIN C) 1000 MG tablet Take 1,000 mg by mouth daily. Airborne emergen c     beclomethasone (QVAR  REDIHALER) 80 MCG/ACT inhaler Inhale 1 puff into the lungs 2 (two) times daily. 31.8 g 1   Calcium Carb-Cholecalciferol  (CALTRATE 600+D3 PO) Take 1 tablet by mouth daily.     Cholecalciferol  (VITAMIN D ) 50 MCG (2000 UT) tablet Take 2,000 Units by mouth daily.     Coenzyme Q10 (CO Q 10 PO) Take 200 mg by mouth daily.     colestipol  (COLESTID ) 1 g tablet Take 2 tablets (2 g total) by mouth 2 (two) times daily. 180 tablet 0   cyclobenzaprine  (FLEXERIL ) 5 MG tablet Take 1  tablet (5 mg total) by mouth every 8 (eight) hours as needed. 30 tablet 1   diphenoxylate -atropine  (LOMOTIL ) 2.5-0.025 MG tablet Take 1-2 tablets by mouth every 4 hours as needed for diarrhea/loose stools 90 tablet 1   DULoxetine  (CYMBALTA ) 30 MG capsule Take 1 capsule (30 mg total) by mouth every evening. 90 capsule 4   ezetimibe  (ZETIA ) 10 MG tablet Take 10 mg by mouth every evening.     furosemide  (LASIX ) 20 MG tablet Take 2 tablets TWICE DAILY until your weight is less than 159 pounds.  Once your weight is less than 159  pounds, take only 1 tablet daily 180 tablet 2   gabapentin  (NEURONTIN ) 800 MG tablet Take 1 tablet (800 mg total) by mouth 2 (two) times daily. 180 tablet 3   ibandronate (BONIVA) 150 MG tablet Take 150 mg by mouth every 30 (thirty) days.     ipratropium (ATROVENT ) 0.03 % nasal spray Place 2 sprays into both nostrils every 12 (twelve) hours. 30 mL 4   ipratropium-albuterol  (DUONEB) 0.5-2.5 (3) MG/3ML SOLN Take 3 mLs by nebulization 3 (three) times daily as needed. 360 mL 5   loratadine  (CLARITIN ) 10 MG tablet Take 1 tablet (10 mg total) by mouth daily as needed for allergies (Can take an extra dose during flare ups.). 90 tablet 1   metoprolol  tartrate (LOPRESSOR ) 50 MG tablet Take 1 tablet (50 mg total) by mouth 2 (two) times daily.     mometasone  (ASMANEX ) 220 MCG/ACT inhaler Inhale 1 puff into the lungs 2 (two) times daily. 3 each 1   Multiple Vitamins-Minerals (MULTIVITAMIN PO) Take 1 tablet by mouth daily.     Multiple Vitamins-Minerals (PRESERVISION AREDS PO) Take 1 capsule by mouth daily.     Olopatadine-Mometasone  (RYALTRIS ) 665-25 MCG/ACT SUSP Place 2 sprays into the nose 2 (two) times daily. 87 g 1   omega-3 acid ethyl esters (LOVAZA ) 1 g capsule Take 2 capsules (2 g total) by mouth 2 (two) times daily. 360 capsule 3   PHENobarbital  (LUMINAL) 97.2 MG tablet TAKE 1 AND 1/2 TABLETS BY MOUTH EVERY DAY 135 tablet 2   potassium chloride  SA (KLOR-CON  M) 20 MEQ tablet While taking 40 mg of Furosemide , take Potassium Chloride  20 meq twice a day. When you decrease Furosemide  to 20 mg, decrease Potassium Chloride  to 20 meq once a day. 180 tablet 3   PRALUENT  150 MG/ML SOAJ Inject 150 mg into the skin every 14 (fourteen) days.     pyridoxine  (B-6) 100 MG tablet Take 100 mg by mouth daily.     telmisartan (MICARDIS) 40 MG tablet Take 40 mg by mouth daily.     topiramate  (TOPAMAX ) 50 MG tablet 1 tablet (50 mg total) in the morning AND 2 tablets (100 mg total) every evening. 270 tablet 2   vitamin E 400  UNIT capsule Take 400 Units by mouth daily.     ZEPBOUND 2.5 MG/0.5ML Pen Inject 2.5 mg into the skin once a week.     omeprazole (PRILOSEC) 20 MG capsule Take 20 mg by mouth 2 (two) times daily before a meal.  3   No current facility-administered medications for this visit.    Allergies as of 11/04/2023 - Review Complete 11/04/2023  Allergen Reaction Noted   Tylenol  [acetaminophen ] Swelling and Other (See Comments) 01/08/2011   Aspirin  Other (See Comments) 11/06/2009   Cefdinir Other (See Comments) 08/02/2022   Colesevelam Other (See Comments) 10/25/2011   Ezetimibe -simvastatin Other (See Comments) 07/26/2009   Penicillin g benzathine Itching  08/19/2022   Rosuvastatin  07/26/2009   Wound dressing adhesive Other (See Comments) 01/23/2021   Latex Itching 04/12/2013    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   Carpal tunnel syndrome    Cervical radiculitis    Chronic diarrhea 10/24/2023   Collar bone fracture    Depression    Dermatomyositis (HCC)    Diabetes mellitus without complication (HCC)    Dysrhythmia    Elevated liver enzymes    External hemorrhoids    Fatty liver    Fibromyalgia    Fibromyalgia affecting hand 08/2022   bilateral wrist and hands   Hematuria    negative evaluation   High cholesterol    HTN (hypertension)    Internal hemorrhoids    Laryngitis    Lower back pain    Macular degeneration    Nonalcoholic steatohepatitis (NASH)    Nonalcoholic steatohepatitis (NASH)    Osteopenia    Left side hip   Seizures (HCC)    09/07/2019 pt reports last seizure in 2014   Tubular adenoma of colon     Past Surgical History:  Procedure Laterality Date   ABDOMINAL HYSTERECTOMY  1982   ADENOIDECTOMY     APPENDECTOMY     ATRIAL FIBRILLATION ABLATION N/A 03/13/2023   Procedure: ATRIAL FIBRILLATION ABLATION;   Surgeon: Lei Pump, MD;  Location: [facility];  Service: Cardiovascular;  Laterality: N/A;   BACK SURGERY     BACK SURGERY  07/2015   BRONCHIAL WASHINGS  09/19/2023   Procedure: IRRIGATION, BRONCHUS;  Surgeon: Wilfredo Hanly, MD;  Location: [facility];  Service: Pulmonary;;   CARPAL TUNNEL RELEASE     CATARACT EXTRACTION, BILATERAL     CHOLECYSTECTOMY  2003   COLONOSCOPY     EXPLORATORY LAPAROTOMY     FLEXIBLE BRONCHOSCOPY Bilateral 09/19/2023   Procedure: BRONCHOSCOPY, FLEXIBLE;  Surgeon: Wilfredo Hanly, MD;  Location[facility];  Service: Pulmonary;  Laterality: Bilateral;  bronchoscopy for BAL   LAMINECTOMY     SHOULDER SURGERY Right 2000   SINOSCOPY     TONSILLECTOMY     TOTAL HIP ARTHROPLASTY Right 02/12/2022   Procedure: RIGHT TOTAL HIP ARTHROPLASTY ANTERIOR APPROACH;  Surgeon: Arnie Lao, MD;  Location: [facility] OR;  Service: Orthopedics;  Laterality: Right;    Review of Systems:    All systems reviewed and negative except where noted in HPI.    Physical Exam:  BP 130/62   Pulse 70   Ht 4\' 10"  (1.473 m)   Wt 163 lb 12.8 oz (74.3 kg)   LMP 07/01/1980   BMI 34.23 kg/m  Patient's last menstrual period was 07/01/1980.  General: Well-nourished, well-developed in no acute distress.  Neuro: Alert and oriented x 3.  Grossly intact.  Psych: Alert and cooperative, normal mood and affect.   Imaging Studies: No results found.  Labs: CBC    Component Value Date/Time   WBC 6.0 10/24/2023 1108   RBC 4.28 10/24/2023 1108   HGB 13.9 10/24/2023 1108   HGB 13.0 07/22/2023 1524   HCT 40.8 10/24/2023 1108   HCT 37.9 07/22/2023 1524   PLT 184 10/24/2023 1108   PLT 163 07/22/2023 1524   MCV 95.3 10/24/2023 1108   MCV 99 (H) 07/22/2023 1524   MCH 32.5 10/24/2023 1108  MCHC 34.1 10/24/2023 1108   RDW 11.0 10/24/2023 1108   RDW 11.6 (L) 07/22/2023 1524   LYMPHSABS 1.6 07/22/2023 1524   EOSABS 198 10/24/2023 1108   EOSABS 0.1 07/22/2023  1524   BASOSABS 48 10/24/2023 1108   BASOSABS 0.1 07/22/2023 1524    CMP     Component Value Date/Time   NA 140 10/24/2023 1108   NA 135 04/03/2023 1602   K 4.4 10/24/2023 1108   CL 103 10/24/2023 1108   CO2 26 10/24/2023 1108   GLUCOSE 93 10/24/2023 1108   BUN 16 10/24/2023 1108   BUN 23 04/03/2023 1602   CREATININE 0.49 (L) 10/24/2023 1108   CALCIUM 9.4 10/24/2023 1108   PROT 7.6 10/24/2023 1108   ALBUMIN  3.8 09/19/2023 0635   AST 35 10/24/2023 1108   ALT 34 (H) 10/24/2023 1108   ALKPHOS 62 09/19/2023 0635   BILITOT 0.6 10/24/2023 1108   GFRNONAA >60 09/19/2023 0635       Assessment and Plan:   Amber Bennett is a 73 y.o. y/o female returns for follow-up of C. difficile diarrhea treated 10/08/23 with vancomycin  125 mg 4 times daily x 14 days.  She said finished antibiotics 1 week ago and her diarrhea has resolved.  She was on multiple antibiotics in the past 6 months which likely caused her C. difficile infection.  Has previous history of irritable bowel syndrome, diarrhea predominant.  1.  C. difficile diarrhea - Currently resolved post treatment with vancomycin . - If she has recurrent C. difficile in the future, then order C. difficile toxin PCR stool test. - Patient education discussed at length. - Start OTC Florastor probiotic 1 capsule twice daily for 1 month. - Avoid all unnecessary antibiotics in the future.  2.  Irritable bowel syndrome, diarrhea predominant - Continue current treatment.  Amber Canard, PA-C  Follow up as needed if she has recurrent diarrhea.

## 2023-11-04 NOTE — Patient Instructions (Addendum)
 Start OTC Florastor Probiotic, Take 1 Capsule twice daily to help prevent recurrent C. Difficile infection.        Follow up as needed.    Thank you for trusting me with your gastrointestinal care!

## 2023-11-04 NOTE — Telephone Encounter (Signed)
 Author: Luana Rumple, MD Service: Cardiology Author Type: Physician  Filed: 11/02/2023  3:52 PM Encounter Date: 10/24/2023 Status: Signed  Editor: Luana Rumple, MD (Physician)   Alston Jerry, can Mrs. Syrek please get an echo for shortness of breath between now and her July appointment with me? Thanks     Called and left message informing that Dr Alvis Ba would like for her to get an Echocardiogram for shortness of breath before her appt in July. Someone will call to get this scheduled. Will send MyChart message.

## 2023-11-05 ENCOUNTER — Ambulatory Visit: Payer: Medicare Other | Admitting: Pulmonary Disease

## 2023-11-05 ENCOUNTER — Encounter: Payer: Self-pay | Admitting: Pulmonary Disease

## 2023-11-05 VITALS — BP 124/70 | HR 72 | Ht <= 58 in | Wt 160.0 lb

## 2023-11-05 DIAGNOSIS — M3313 Other dermatomyositis without myopathy: Secondary | ICD-10-CM

## 2023-11-05 DIAGNOSIS — J9611 Chronic respiratory failure with hypoxia: Secondary | ICD-10-CM | POA: Diagnosis not present

## 2023-11-05 DIAGNOSIS — I2723 Pulmonary hypertension due to lung diseases and hypoxia: Secondary | ICD-10-CM | POA: Diagnosis not present

## 2023-11-05 DIAGNOSIS — J984 Other disorders of lung: Secondary | ICD-10-CM | POA: Diagnosis not present

## 2023-11-05 DIAGNOSIS — Z87891 Personal history of nicotine dependence: Secondary | ICD-10-CM | POA: Diagnosis not present

## 2023-11-05 NOTE — Progress Notes (Signed)
 Synopsis: Referred in February 2024 for Shortness of Breath  Subjective:   PATIENT ID: Amber Bennett GENDER: female DOB: 03/30/1951, MRN: [id number]  HPI  Chief Complaint  Patient presents with   Follow-up   Amber Bennett is a 73 year old woman, former smoker with history of dermatomyositis, DMII, atrial fibrillation s/p ablation and hypertension who returns to pulmonary clinic for shortness of breath.   Initial OV 08/05/23 Records reviewed from [facility]. She had pneumonia in 2023.   She has experienced progressive shortness of breath over the past two years, which has worsened over time. She finds it difficult to walk to her grandson's soccer field without stopping due to breathlessness. No syncope, but she describes an inability to breathe. Some wheezing is present, but usually no cough, although she produces a lot of phlegm and has nasal congestion. She has a history of AFib and underwent an ablation procedure, which did not alleviate her shortness of breath. Her blood pressure has improved post-ablation, but the breathing issues persist. She has been on montelukast  for allergies and recently started on Asmanex  inhaler.  She has a history of dermatomyositis, diagnosed in 2010, with symptoms including morning hand stiffness and swelling, particularly in the wrist area. She was treated with steroids for a month without improvement and is currently on Plaquenil , which has not alleviated her symptoms but has caused hair loss. She reports fatigue, sleeping 10-12 hours, and feeling tired enough to nap shortly after waking. She uses a CPAP machine for sleep apnea diagnosed during her Afib evaluation.  She reports a swollen gland near her neck for the past 4-5 months, which is not tender but causes discomfort when turning her neck. She has experienced issues with swallowing, particularly with chicken, and underwent an endoscopy a few years ago that was normal. She has a history  of hoarseness and underwent vocal therapy after a hip replacement surgery.  She has a history of a broken collarbone and hip replacement, which have limited her physical activity. She mentions weight gain due to inactivity, having increased from a size 4 to a size 10-12. She is unable to drive due to the collarbone injury and experiences groin pain after walking. She has a history of smoking, having quit at age 37 after smoking since her teenage years.  OV 11/05/23 Repeat CT Chest scan 09/11/23 showed mild diffuse bronchial wall thickening and cylindrical bronchiectasis. Airspace consolidation of the RUL has resolved, with remaining cluster of lung nodules measuring 5mm. Resolution of clustered nodularity within anteromedial LUL. RML has peripheral bronchiolectasis with peripheral tree in bud nodularity similar to January scan.   She underwent bronchoscopy with BAL on 09/19/23. Initial cultures were showing oral flora, but have resulted as no growth. AFB cultures are negative. Fungal cultures grew aspergillus fumigatus. She was evaluated by ID and thought not to have pathogenic infection.   Hypersensitivity pneumonitis lab panel was negative. Inflammatory lab panel was negative, positive ANA with1:40 titer. Spoke with Dr. Ebbie Goldmann over the phone about her case, she has synovitis of her hands which has responded to steroid injections but not necessarily to courses of oral steroids.   Modified barium swallow showed mild impairment of laryngeal elevation which allows for trace and transient penetration thin and mildly thick liquids early in swallow. Esophagus appears slow to clear with some retrograde flow. ENT and GI evaluation recommended.  Patient's diarrhea has finally improved after c. Diff testing returned positive and she was treated with oral vancomycin .  She is feeling better after recovering from her c. Diff infection but continues to have shortness of breath. She is looking forward to a beach  trip.   She continues with her asmanex  inhaler.   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   Carpal tunnel syndrome    Cervical radiculitis    Chronic diarrhea 10/24/2023   Collar bone fracture    Depression    Dermatomyositis (HCC)    Diabetes mellitus without complication (HCC)    Dysrhythmia    Elevated liver enzymes    External hemorrhoids    Fatty liver    Fibromyalgia    Fibromyalgia affecting hand 08/2022   bilateral wrist and hands   Hematuria    negative evaluation   High cholesterol    HTN (hypertension)    Internal hemorrhoids    Laryngitis    Lower back pain    Macular degeneration    Nonalcoholic steatohepatitis (NASH)    Nonalcoholic steatohepatitis (NASH)    Osteopenia    Left side hip   Seizures (HCC)    09/07/2019 pt reports last seizure in 2014   Tubular adenoma of colon      Family History  Problem Relation Age of Onset   Cancer Mother        Cancer of the bone marrow   Heart attack Mother    Stroke Sister    Hypertension Sister    Diabetes Sister    Colon cancer Maternal Aunt    Seizures Neg Hx    Esophageal cancer Neg Hx    Rectal cancer Neg Hx    Stomach cancer Neg Hx      Social History   Socioeconomic History   Marital status: Married    Spouse name: Not on file   Number of children: 2   Years of education: 14   Highest education level: Not on file  Occupational History   Occupation: Employed    Comment: Works for [employer], [place] and [employer]  Tobacco Use   Smoking status: Former    Current packs/day: 0.00    Types: Cigarettes    Quit  date: 07/01/1978    Years since quitting: 45.3   Smokeless tobacco: Never   Tobacco comments:    Former smoker 04/10/23  Vaping Use   Vaping status: Never Used  Substance and Sexual Activity   Alcohol  use: Yes    Alcohol /week: 7.0 standard drinks of alcohol     Types: 7 Glasses of wine per week    Comment: 1 glass of white wine nightly 04/10/23   Drug use: No   Sexual activity: Yes    Partners: Male    Birth control/protection: Surgical    Comment: TAH  Other Topics Concern   Not on file  Social History Narrative   Patient lives at home with her husband Loretta Romp).   Retired.   Education two years of business college.   Right handed.   Caffeine  Social Drivers of Corporate investment banker Strain: Low Risk  (06/17/2018)   Received from [facility], [facility]   Overall Financial Resource Strain (CARDIA)    Difficulty of Paying Living Expenses: Not hard at all  Food Insecurity: No Food Insecurity (06/17/2018)   Received from [facility], [facility]   Hunger Vital Sign    Worried About Running Out of Food in the Last Year: Never true    Ran Out of Food in the Last Year: Never true  Transportation Needs: No Transportation Needs (06/17/2018)   Received from [facility], [facility]   [facility] - Transportation    In the past 12 months, has lack of transportation kept you from medical appointments or from getting medications?: No    Lack of Transportation (Non-Medical): No  Physical Activity: Not on file  Stress: No Stress Concern Present (06/17/2018)   Received from [facility], [facility]   [employer] of Occupational [facility] - Occupational Stress Questionnaire    Feeling of Stress : Only a little  Social Connections: Unknown (06/17/2018)   Received from [facility], [facility]   Social  Connection and Isolation Panel [NHANES]    Frequency of Communication with Friends and Family: Not on file    Frequency of Social Gatherings with Friends and Family: Not on file    Attends Religious Services: Not on file    Active Member of Clubs or Organizations: Not on file    Attends Banker Meetings: Not on file    Marital Status: Married  Catering manager Violence: Not on file     Allergies  Allergen Reactions   Tylenol  [Acetaminophen ] Swelling and Other (See Comments)    Throat swelling, anaphylaxis   Aspirin  Other (See Comments)    gi bleeding in 2005   Cefdinir Other (See Comments)    Other Reaction(s): hives, itching   Colesevelam Other (See Comments)    Other Reaction(s): has never tried it but would not be a good choice as all seizure meds would have to be taken at least four hours prior to the dose and with her regimen that just won't be possible.   Ezetimibe -Simvastatin Other (See Comments)    Other Reaction(s): elevated LFT   Penicillin G Benzathine Itching   Rosuvastatin     Other Reaction(s): skin rash and myalgia   Wound Dressing Adhesive Other (See Comments)   Latex Itching    Rash      Outpatient Medications Prior to Visit  Medication Sig Dispense Refill   Alirocumab  (PRALUENT ) 150 MG/ML SOAJ Inject 1 mL (150 mg total) into the skin every 14 (fourteen) days. 6 mL 3   amiodarone  (PACERONE ) 200 MG tablet Take 1 tablet (200 mg total) by mouth daily. 90 tablet 1   amLODipine  (NORVASC ) 2.5 MG tablet Take 1 tablet (2.5 mg total) by mouth at bedtime. 90 tablet 3   apixaban  (ELIQUIS ) 5 MG TABS tablet Take 1 tablet (5 mg total) by mouth 2 (two) times daily. 180 tablet 1   Ascorbic Acid (VITAMIN C) 1000 MG tablet Take 1,000 mg by mouth daily. Airborne emergen c     beclomethasone (QVAR  REDIHALER) 80 MCG/ACT inhaler Inhale 1 puff into the lungs 2 (two) times daily. 31.8 g 1   Calcium Carb-Cholecalciferol  (CALTRATE 600+D3 PO) Take 1 tablet by mouth daily.      Cholecalciferol  (VITAMIN  D) 50 MCG (2000 UT) tablet Take 2,000 Units by mouth daily.     Coenzyme Q10 (CO Q 10 PO) Take 200 mg by mouth daily.     colestipol  (COLESTID ) 1 g tablet Take 2 tablets (2 g total) by mouth 2 (two) times daily. 180 tablet 0   cyclobenzaprine  (FLEXERIL ) 5 MG tablet Take 1 tablet (5 mg total) by mouth every 8 (eight) hours as needed. 30 tablet 1   diphenoxylate -atropine  (LOMOTIL ) 2.5-0.025 MG tablet Take 1-2 tablets by mouth every 4 hours as needed for diarrhea/loose stools 90 tablet 1   DULoxetine  (CYMBALTA ) 30 MG capsule Take 1 capsule (30 mg total) by mouth every evening. 90 capsule 4   ezetimibe  (ZETIA ) 10 MG tablet Take 10 mg by mouth every evening.     furosemide  (LASIX ) 20 MG tablet Take 2 tablets TWICE DAILY until your weight is less than 159 pounds.  Once your weight is less than 159 pounds, take only 1 tablet daily 180 tablet 2   gabapentin  (NEURONTIN ) 800 MG tablet Take 1 tablet (800 mg total) by mouth 2 (two) times daily. 180 tablet 3   ibandronate (BONIVA) 150 MG tablet Take 150 mg by mouth every 30 (thirty) days.     ipratropium (ATROVENT ) 0.03 % nasal spray Place 2 sprays into both nostrils every 12 (twelve) hours. 30 mL 4   ipratropium-albuterol  (DUONEB) 0.5-2.5 (3) MG/3ML SOLN Take 3 mLs by nebulization 3 (three) times daily as needed. 360 mL 5   loratadine  (CLARITIN ) 10 MG tablet Take 1 tablet (10 mg total) by mouth daily as needed for allergies (Can take an extra dose during flare ups.). 90 tablet 1   metoprolol  tartrate (LOPRESSOR ) 50 MG tablet Take 1 tablet (50 mg total) by mouth 2 (two) times daily.     mometasone  (ASMANEX ) 220 MCG/ACT inhaler Inhale 1 puff into the lungs 2 (two) times daily. 3 each 1   Multiple Vitamins-Minerals (MULTIVITAMIN PO) Take 1 tablet by mouth daily.     Multiple Vitamins-Minerals (PRESERVISION AREDS PO) Take 1 capsule by mouth daily.     Olopatadine-Mometasone  (RYALTRIS ) 665-25 MCG/ACT SUSP Place 2 sprays into the nose 2  (two) times daily. 87 g 1   omega-3 acid ethyl esters (LOVAZA ) 1 g capsule Take 2 capsules (2 g total) by mouth 2 (two) times daily. 360 capsule 3   PHENobarbital  (LUMINAL) 97.2 MG tablet TAKE 1 AND 1/2 TABLETS BY MOUTH EVERY DAY 135 tablet 2   potassium chloride  SA (KLOR-CON  M) 20 MEQ tablet While taking 40 mg of Furosemide , take Potassium Chloride  20 meq twice a day. When you decrease Furosemide  to 20 mg, decrease Potassium Chloride  to 20 meq once a day. 180 tablet 3   PRALUENT  150 MG/ML SOAJ Inject 150 mg into the skin every 14 (fourteen) days.     pyridoxine  (B-6) 100 MG tablet Take 100 mg by mouth daily.     telmisartan (MICARDIS) 40 MG tablet Take 40 mg by mouth daily.     topiramate  (TOPAMAX ) 50 MG tablet 1 tablet (50 mg total) in the morning AND 2 tablets (100 mg total) every evening. 270 tablet 2   vitamin E 400 UNIT capsule Take 400 Units by mouth daily.     ZEPBOUND 2.5 MG/0.5ML Pen Inject 2.5 mg into the skin once a week.     omeprazole (PRILOSEC) 20 MG capsule Take 20 mg by mouth 2 (two) times daily before a meal.  3   No facility-administered medications prior to visit.  Review of Systems  Constitutional:  Negative for chills, fever, malaise/fatigue and weight loss.  HENT:  Negative for congestion, sinus pain and sore throat.   Eyes: Negative.   Respiratory:  Positive for shortness of breath. Negative for cough, hemoptysis, sputum production and wheezing.   Cardiovascular:  Negative for chest pain, palpitations, orthopnea, claudication and leg swelling.  Gastrointestinal:  Negative for abdominal pain, heartburn, nausea and vomiting.  Genitourinary: Negative.   Musculoskeletal:  Positive for joint pain. Negative for myalgias.  Skin:  Negative for rash.  Neurological:  Negative for weakness.  Endo/Heme/Allergies: Negative.   Psychiatric/Behavioral: Negative.     Objective:   Vitals:   11/05/23 1312  BP: 124/70  Pulse: 72  SpO2: 94%  Weight: 160 lb (72.6 kg)  Height:  4\' 10"  (1.473 m)    Physical Exam Constitutional:      General: She is not in acute distress.    Appearance: Normal appearance. She is obese.  Eyes:     General: No scleral icterus.    Conjunctiva/sclera: Conjunctivae normal.  Cardiovascular:     Rate and Rhythm: Normal rate and regular rhythm.  Pulmonary:     Breath sounds: Rales (mild rales, left base) present. No wheezing or rhonchi.  Musculoskeletal:     Right lower leg: No edema.     Left lower leg: No edema.  Skin:    General: Skin is warm and dry.  Neurological:     General: No focal deficit present.    CBC    Component Value Date/Time   WBC 6.0 10/24/2023 1108   RBC 4.28 10/24/2023 1108   HGB 13.9 10/24/2023 1108   HGB 13.0 07/22/2023 1524   HCT 40.8 10/24/2023 1108   HCT 37.9 07/22/2023 1524   PLT 184 10/24/2023 1108   PLT 163 07/22/2023 1524   MCV 95.3 10/24/2023 1108   MCV 99 (H) 07/22/2023 1524   MCH 32.5 10/24/2023 1108   MCHC 34.1 10/24/2023 1108   RDW 11.0 10/24/2023 1108   RDW 11.6 (L) 07/22/2023 1524   LYMPHSABS 1.6 07/22/2023 1524   EOSABS 198 10/24/2023 1108   EOSABS 0.1 07/22/2023 1524   BASOSABS 48 10/24/2023 1108   BASOSABS 0.1 07/22/2023 1524      Latest Ref Rng & Units 10/24/2023   11:08 AM 09/19/2023    6:35 AM 04/03/2023    4:02 PM  BMP  Glucose 65 - 99 mg/dL 93  161  096   BUN 7 - 25 mg/dL 16  12  23    Creatinine 0.60 - 1.00 mg/dL 0.45  4.09  8.11   BUN/Creat Ratio 6 - 22 (calc) 33   32   Sodium 135 - 146 mmol/L 140  136  135   Potassium 3.5 - 5.3 mmol/L 4.4  3.8  4.7   Chloride 98 - 110 mmol/L 103  104  94   CO2 20 - 32 mmol/L 26  24  24    Calcium 8.6 - 10.4 mg/dL 9.4  9.1  9.1    Chest imaging: CT Chest w/o contrast 09/11/23 1. Interval resolution of airspace consolidation within the basilar right upper lobe. In the area of the previous consolidative change there is a cluster of lung nodules which measure up to 5 mm. The clustered appearance of these nodules strongly suggest  sequelae of an inflammatory or infectious process. Most likely chronic atypical infection such as MAI 2. Interval resolution of clustered nodularity within the anteromedial left upper lobe. 3. Stable appearance of  peripheral bronchiolectasis with peripheral clustered tree-in-bud nodularity within the right middle lobe and posterior lingula. Findings are also favored to represent sequelae of chronic atypical mycobacterial infection. 4. Stable 5 mm peripheral subpleural nodule in the posterolateral right middle lobe. 5. Coronary artery calcifications noted. 6.  Aortic Atherosclerosis (ICD10-I70.0).  HRCT Chest 07/31/23 1. New clustered heterogeneous airspace opacity and nodularity in the inferior right upper lobe measuring 2.4 x 1.6 cm. Additional tiny nodules and small consolidations in the medial left upper lobe and in the right middle lobe. Findings are most consistent with multifocal atypical infection. 2. Mild, bland appearing scarring of the lung bases. No evidence of fibrotic interstitial lung disease. 3. Hepatic steatosis. Coarse contour of the liver, suggestive of cirrhosis. 4. Subacute, partially callused fracture of the anterior right fifth rib.  CT Chest 03/05/23 Cardiovascular: No significant vascular findings. Normal heart size. No pericardial effusion.   Mediastinum/Nodes: No enlarged mediastinal or axillary lymph nodes. Thyroid  gland, trachea, and esophagus demonstrate no significant findings.   Lungs/Pleura: There is mild bibasilar atelectasis. Minimal ground-glass opacities in the right upper lung may represent scarring from prior inflammatory changes in this area. No pleural effusion or pneumothorax.  CTA Chest PE 02/18/22 Cardiovascular: Satisfactory opacification of the pulmonary arteries to the segmental level. No evidence of pulmonary embolism. Normal heart size. No pericardial effusion. Normal caliber thoracic aorta with mild atherosclerotic disease.    Mediastinum/Nodes: Esophagus and thyroid  are unremarkable. No pathologically enlarged lymph nodes seen in the chest.   Lungs/Pleura: Central airways are patent. Mild right upper lobe consolidation with surrounding ground-glass. No consolidation, pleural effusion or pneumothorax.  PFT:    Latest Ref Rng & Units 09/04/2023    2:36 PM  PFT Results  FVC-Pre L 1.60   FVC-Predicted Pre % 73   FVC-Post L 1.54   FVC-Predicted Post % 70   Pre FEV1/FVC % % 83   Post FEV1/FCV % % 88   FEV1-Pre L 1.34   FEV1-Predicted Pre % 83   FEV1-Post L 1.36   DLCO uncorrected ml/min/mmHg 11.31   DLCO UNC% % 71   DLVA Predicted % 94   TLC L 3.31   TLC % Predicted % 78   RV % Predicted % 64     Labs:  Path:  Echo 01/20/23: LV EF 60-65%. RV size and function is normal. LA mildly dilated. RA normal in size. No atrial level shunt detected by color flow doppler.   Heart Catheterization:       Assessment & Plan:   Restrictive lung disease - Plan: Ambulatory Referral for DME, ECHOCARDIOGRAM COMPLETE BUBBLE STUDY  Chronic respiratory failure with hypoxia (HCC) - Plan: ECHOCARDIOGRAM COMPLETE BUBBLE STUDY  Pulmonary hypertension due to lung diseases and hypoxia (HCC) - Plan: ECHOCARDIOGRAM COMPLETE BUBBLE STUDY  Discussion: Amber Bennett is a 73 year old woman, former smoker with history of dermatomyositis, DMII, atrial fibrillation s/p ablation and hypertension who returns to pulmonary clinic for shortness of breath.   Hypoxemic Respiratory failure She has drop in her SpO2 on simple walk today. Differential includes shunt physiology, possible ILD and pulmonary hypertension. PFTs show mild restriction and borderline mild diffusion defect. - check echo with bubble study - send DME order for supplemental oxygen, use 2L pulsed via POC with ambulation.  Bronchiectasis, Mild -will plan for follow up CT Chest scan and PFTs in a year  Dermatomyositis History of dermatomyositis with recent symptoms  of hand swelling and pain. Currently on Plaquenil .  -She is followed by rheumatology -  will monitor closely for possible ILD involvement  Sleep apnea - Continue CPAP therapy - Check nocturnal oxygen study while on CPAP  Follow-up in 3 months  55 minutes spent on this visit.   Amber German, MD [facility] Office: [phone]    Current Outpatient Medications:    Alirocumab  (PRALUENT ) 150 MG/ML SOAJ, Inject 1 mL (150 mg total) into the skin every 14 (fourteen) days., Disp: 6 mL, Rfl: 3   amiodarone  (PACERONE ) 200 MG tablet, Take 1 tablet (200 mg total) by mouth daily., Disp: 90 tablet, Rfl: 1   amLODipine  (NORVASC ) 2.5 MG tablet, Take 1 tablet (2.5 mg total) by mouth at bedtime., Disp: 90 tablet, Rfl: 3   apixaban  (ELIQUIS ) 5 MG TABS tablet, Take 1 tablet (5 mg total) by mouth 2 (two) times daily., Disp: 180 tablet, Rfl: 1   Ascorbic Acid (VITAMIN C) 1000 MG tablet, Take 1,000 mg by mouth daily. Airborne emergen c, Disp: , Rfl:    beclomethasone (QVAR  REDIHALER) 80 MCG/ACT inhaler, Inhale 1 puff into the lungs 2 (two) times daily., Disp: 31.8 g, Rfl: 1   Calcium Carb-Cholecalciferol  (CALTRATE 600+D3 PO), Take 1 tablet by mouth daily., Disp: , Rfl:    Cholecalciferol  (VITAMIN D ) 50 MCG (2000 UT) tablet, Take 2,000 Units by mouth daily., Disp: , Rfl:    Coenzyme Q10 (CO Q 10 PO), Take 200 mg by mouth daily., Disp: , Rfl:    colestipol  (COLESTID ) 1 g tablet, Take 2 tablets (2 g total) by mouth 2 (two) times daily., Disp: 180 tablet, Rfl: 0   cyclobenzaprine  (FLEXERIL ) 5 MG tablet, Take 1 tablet (5 mg total) by mouth every 8 (eight) hours as needed., Disp: 30 tablet, Rfl: 1   diphenoxylate -atropine  (LOMOTIL ) 2.5-0.025 MG tablet, Take 1-2 tablets by mouth every 4 hours as needed for diarrhea/loose stools, Disp: 90 tablet, Rfl: 1   DULoxetine  (CYMBALTA ) 30 MG capsule, Take 1 capsule (30 mg total) by mouth every evening., Disp: 90 capsule, Rfl: 4   ezetimibe  (ZETIA )  10 MG tablet, Take 10 mg by mouth every evening., Disp: , Rfl:    furosemide  (LASIX ) 20 MG tablet, Take 2 tablets TWICE DAILY until your weight is less than 159 pounds.  Once your weight is less than 159 pounds, take only 1 tablet daily, Disp: 180 tablet, Rfl: 2   gabapentin  (NEURONTIN ) 800 MG tablet, Take 1 tablet (800 mg total) by mouth 2 (two) times daily., Disp: 180 tablet, Rfl: 3   ibandronate (BONIVA) 150 MG tablet, Take 150 mg by mouth every 30 (thirty) days., Disp: , Rfl:    ipratropium (ATROVENT ) 0.03 % nasal spray, Place 2 sprays into both nostrils every 12 (twelve) hours., Disp: 30 mL, Rfl: 4   ipratropium-albuterol  (DUONEB) 0.5-2.5 (3) MG/3ML SOLN, Take 3 mLs by nebulization 3 (three) times daily as needed., Disp: 360 mL, Rfl: 5   loratadine  (CLARITIN ) 10 MG tablet, Take 1 tablet (10 mg total) by mouth daily as needed for allergies (Can take an extra dose during flare ups.)., Disp: 90 tablet, Rfl: 1   metoprolol  tartrate (LOPRESSOR ) 50 MG tablet, Take 1 tablet (50 mg total) by mouth 2 (two) times daily., Disp: , Rfl:    mometasone  (ASMANEX ) 220 MCG/ACT inhaler, Inhale 1 puff into the lungs 2 (two) times daily., Disp: 3 each, Rfl: 1   Multiple Vitamins-Minerals (MULTIVITAMIN PO), Take 1 tablet by mouth daily., Disp: , Rfl:    Multiple Vitamins-Minerals (PRESERVISION AREDS PO), Take 1 capsule by mouth daily.,  Disp: , Rfl:    Olopatadine-Mometasone  (RYALTRIS ) 665-25 MCG/ACT SUSP, Place 2 sprays into the nose 2 (two) times daily., Disp: 87 g, Rfl: 1   omega-3 acid ethyl esters (LOVAZA ) 1 g capsule, Take 2 capsules (2 g total) by mouth 2 (two) times daily., Disp: 360 capsule, Rfl: 3   PHENobarbital  (LUMINAL) 97.2 MG tablet, TAKE 1 AND 1/2 TABLETS BY MOUTH EVERY DAY, Disp: 135 tablet, Rfl: 2   potassium chloride  SA (KLOR-CON  M) 20 MEQ tablet, While taking 40 mg of Furosemide , take Potassium Chloride  20 meq twice a day. When you decrease Furosemide  to 20 mg, decrease Potassium Chloride  to 20 meq  once a day., Disp: 180 tablet, Rfl: 3   PRALUENT  150 MG/ML SOAJ, Inject 150 mg into the skin every 14 (fourteen) days., Disp: , Rfl:    pyridoxine  (B-6) 100 MG tablet, Take 100 mg by mouth daily., Disp: , Rfl:    telmisartan (MICARDIS) 40 MG tablet, Take 40 mg by mouth daily., Disp: , Rfl:    topiramate  (TOPAMAX ) 50 MG tablet, 1 tablet (50 mg total) in the morning AND 2 tablets (100 mg total) every evening., Disp: 270 tablet, Rfl: 2   vitamin E 400 UNIT capsule, Take 400 Units by mouth daily., Disp: , Rfl:    ZEPBOUND 2.5 MG/0.5ML Pen, Inject 2.5 mg into the skin once a week., Disp: , Rfl:    omeprazole (PRILOSEC) 20 MG capsule, Take 20 mg by mouth 2 (two) times daily before a meal., Disp: , Rfl: 3

## 2023-11-05 NOTE — Patient Instructions (Addendum)
 We will check your oyxgen levels while walking today  Continue asmanex  inhaler 1 puff twice daily - rinse mouth out after each use  We will consider checking an overnight oxygen test on your CPAP depending what your upcoming echo shows  Recommend getting exercise 20-30 minutes 4-5 days per week with walking, stationary bike, treadmill and water aerobics  We will repeat a CT Chest scan in a year and breathing tests  Follow up in 3 months

## 2023-11-07 ENCOUNTER — Encounter: Payer: Self-pay | Admitting: Pulmonary Disease

## 2023-11-10 NOTE — Progress Notes (Signed)
 Addendum: Reviewed and agree with assessment and management plan. Asha Grumbine, Carie Caddy, MD

## 2023-11-11 DIAGNOSIS — I4892 Unspecified atrial flutter: Secondary | ICD-10-CM | POA: Diagnosis not present

## 2023-11-11 DIAGNOSIS — E785 Hyperlipidemia, unspecified: Secondary | ICD-10-CM | POA: Diagnosis not present

## 2023-11-11 DIAGNOSIS — M3390 Dermatopolymyositis, unspecified, organ involvement unspecified: Secondary | ICD-10-CM | POA: Diagnosis not present

## 2023-11-11 DIAGNOSIS — M8580 Other specified disorders of bone density and structure, unspecified site: Secondary | ICD-10-CM | POA: Diagnosis not present

## 2023-11-11 DIAGNOSIS — I110 Hypertensive heart disease with heart failure: Secondary | ICD-10-CM | POA: Diagnosis not present

## 2023-11-11 DIAGNOSIS — Z1331 Encounter for screening for depression: Secondary | ICD-10-CM | POA: Diagnosis not present

## 2023-11-11 DIAGNOSIS — R82998 Other abnormal findings in urine: Secondary | ICD-10-CM | POA: Diagnosis not present

## 2023-11-11 DIAGNOSIS — I509 Heart failure, unspecified: Secondary | ICD-10-CM | POA: Diagnosis not present

## 2023-11-11 DIAGNOSIS — J479 Bronchiectasis, uncomplicated: Secondary | ICD-10-CM | POA: Diagnosis not present

## 2023-11-11 DIAGNOSIS — I10 Essential (primary) hypertension: Secondary | ICD-10-CM | POA: Diagnosis not present

## 2023-11-11 DIAGNOSIS — Z0000 Encounter for general adult medical examination without abnormal findings: Secondary | ICD-10-CM | POA: Diagnosis not present

## 2023-11-11 DIAGNOSIS — R569 Unspecified convulsions: Secondary | ICD-10-CM | POA: Diagnosis not present

## 2023-11-11 DIAGNOSIS — I700 Atherosclerosis of aorta: Secondary | ICD-10-CM | POA: Diagnosis not present

## 2023-11-11 DIAGNOSIS — J9611 Chronic respiratory failure with hypoxia: Secondary | ICD-10-CM | POA: Diagnosis not present

## 2023-11-11 DIAGNOSIS — R7301 Impaired fasting glucose: Secondary | ICD-10-CM | POA: Diagnosis not present

## 2023-11-11 DIAGNOSIS — Z1339 Encounter for screening examination for other mental health and behavioral disorders: Secondary | ICD-10-CM | POA: Diagnosis not present

## 2023-11-12 ENCOUNTER — Encounter: Admitting: Internal Medicine

## 2023-11-12 ENCOUNTER — Telehealth: Payer: Self-pay | Admitting: Physician Assistant

## 2023-11-12 DIAGNOSIS — R197 Diarrhea, unspecified: Secondary | ICD-10-CM

## 2023-11-12 NOTE — Telephone Encounter (Signed)
 PT has a beach trip coming up tomorrow and is exhibiting symptoms of c. Diff again. She wants to know if she should still go on the trip. Please advise.

## 2023-11-12 NOTE — Telephone Encounter (Signed)
 I spoke to pt who states she is leaving for a beach vacation tomorrow morning with family. Onset of symptoms was this morning. Pt states "I sat up this morning to get out of bed and it just happened in my underwear". Pt has only had 1 episode of diarrhea with the "same smell and texture" of her previous case of c diff. Pt denies any abdominal pain, vomiting, or fever. Pt states she had c diff approx 2-4 weeks ago and completed antibiotics about a month ago. Pt is wanting to make sure that it would be safe to go with family members to the beach or if you would recommend testing again to r/o repeat c diff. Please advise.   Thanks,  Cornelio Parkerson, LPN

## 2023-11-13 ENCOUNTER — Ambulatory Visit (HOSPITAL_COMMUNITY)
Admission: RE | Admit: 2023-11-13 | Discharge: 2023-11-13 | Disposition: A | Discharge status: Home / Self Care | Source: Ambulatory Visit | Attending: Cardiovascular Disease | Admitting: Cardiovascular Disease

## 2023-11-13 DIAGNOSIS — I2723 Pulmonary hypertension due to lung diseases and hypoxia: Secondary | ICD-10-CM | POA: Insufficient documentation

## 2023-11-13 DIAGNOSIS — J9611 Chronic respiratory failure with hypoxia: Secondary | ICD-10-CM | POA: Diagnosis not present

## 2023-11-13 DIAGNOSIS — J984 Other disorders of lung: Secondary | ICD-10-CM | POA: Insufficient documentation

## 2023-11-13 LAB — ECHOCARDIOGRAM COMPLETE BUBBLE STUDY
Area-P 1/2: 4.18 cm2
S' Lateral: 2.5 cm

## 2023-11-13 NOTE — Telephone Encounter (Signed)
 I provided the information with recommendations per Brigitte Canard, PA-C. Pt states that she had a busy day having an echo and getting ready for her beach trip. Pt is on her way to the beach and states the symptoms have not persisted and she has only had one more episode of diarrhea. Pt states "I don't think it is c diff I think it might be the BBQ I ate". I reminded pt that if her symptoms persist to go to a walk in clinic, ER, or urgent care if symptoms persist or worsen for further evaluation. Pt verbalized understanding. She will call our office with any further questions, concerns, new or worsening symptoms.

## 2023-11-13 NOTE — Telephone Encounter (Signed)
 Left vm for pt to return my call. Lab orders entered for stool study.

## 2023-11-18 ENCOUNTER — Ambulatory Visit: Payer: Self-pay | Admitting: Pulmonary Disease

## 2023-11-19 ENCOUNTER — Ambulatory Visit: Attending: Cardiovascular Disease | Admitting: Cardiovascular Disease

## 2023-11-19 ENCOUNTER — Encounter: Payer: Self-pay | Admitting: Cardiovascular Disease

## 2023-11-19 VITALS — BP 126/82 | HR 69 | Ht 58.5 in | Wt 167.0 lb

## 2023-11-19 DIAGNOSIS — R0602 Shortness of breath: Secondary | ICD-10-CM | POA: Diagnosis not present

## 2023-11-19 DIAGNOSIS — J984 Other disorders of lung: Secondary | ICD-10-CM

## 2023-11-19 DIAGNOSIS — I5032 Chronic diastolic (congestive) heart failure: Secondary | ICD-10-CM | POA: Diagnosis not present

## 2023-11-19 DIAGNOSIS — I700 Atherosclerosis of aorta: Secondary | ICD-10-CM

## 2023-11-19 DIAGNOSIS — G4733 Obstructive sleep apnea (adult) (pediatric): Secondary | ICD-10-CM

## 2023-11-19 DIAGNOSIS — Z01812 Encounter for preprocedural laboratory examination: Secondary | ICD-10-CM

## 2023-11-19 DIAGNOSIS — I10 Essential (primary) hypertension: Secondary | ICD-10-CM | POA: Diagnosis not present

## 2023-11-19 DIAGNOSIS — M3313 Other dermatomyositis without myopathy: Secondary | ICD-10-CM

## 2023-11-19 DIAGNOSIS — I480 Paroxysmal atrial fibrillation: Secondary | ICD-10-CM

## 2023-11-19 DIAGNOSIS — D6869 Other thrombophilia: Secondary | ICD-10-CM

## 2023-11-19 DIAGNOSIS — Z01818 Encounter for other preprocedural examination: Secondary | ICD-10-CM | POA: Diagnosis not present

## 2023-11-19 MED ORDER — FUROSEMIDE 40 MG PO TABS: 80.0000 mg | ORAL_TABLET | Freq: Every day | ORAL | 3 refills | Status: DC

## 2023-11-19 NOTE — Patient Instructions (Signed)
 Medication Instructions:  INCREASE LASIX  TO 80 MG DAILY  STOP AMIODARONE   *If you need a refill on your cardiac medications before your next appointment, please call your pharmacy*  Lab Work: BNP, CBC, BMET TODAY If you have labs (blood work) drawn today and your tests are completely normal, you will receive your results only by: MyChart Message (if you have MyChart) OR A paper copy in the mail If you have any lab test that is abnormal or we need to change your treatment, we will call you to review the results.  Testing/Procedures: Your physician has requested that you have a cardiac catheterization. Cardiac catheterization is used to diagnose and/or treat various heart conditions. Doctors may recommend this procedure for a number of different reasons. The most common reason is to evaluate chest pain. Chest pain can be a symptom of coronary artery disease (CAD), and cardiac catheterization can show whether plaque is narrowing or blocking your heart's arteries. This procedure is also used to evaluate the valves, as well as measure the blood flow and oxygen levels in different parts of your heart. For further information please visit [email or web address] Please follow instruction sheet, as given.  Follow-Up: At [facility], you and your health needs are our priority.  As part of our continuing mission to provide you with exceptional heart care, our providers are all part of one team.  This team includes your primary Cardiologist (physician) and Advanced Practice Providers or APPs (Physician Assistants and Nurse Practitioners) who all work together to provide you with the care you need, when you need it.  Your next appointment:   4 month(s)  Provider:   Luana Rumple, MD      Cardiac/Peripheral Catheterization   You are scheduled for a Cardiac Catheterization on Wednesday, May 28 with Dr. Jules Oar.  1. Please arrive at the [facility] (Main Entrance A) at [facility]: [address] [place], [place] [address] at 9:30 AM (This time is 2 hour(s) before your procedure to ensure your preparation).   Free valet parking service is available. You will check in at ADMITTING. The support person will be asked to wait in the waiting room.  It is OK to have someone drop you off and come back when you are ready to be discharged.        Special note: Every effort is made to have your procedure done on time. Please understand that emergencies sometimes delay scheduled procedures.  2. Diet: Do not eat solid foods after midnight.  You may have clear liquids until 5 AM the day of the procedure.  3. Labs: TODAY  4. Medication instructions in preparation for your procedure:   Contrast Allergy: No  Stop taking Eliquis  (Apixiban) on Monday, May 25.  On the morning of your procedure, take Aspirin  81 mg and any morning medicines NOT listed above.  You may use sips of water.  5. Plan to go home the same day, you will only stay overnight if medically necessary. 6. You MUST have a responsible adult to drive you home. 7. An adult MUST be with you the first 24 hours after you arrive home. 8. Bring a current list of your medications, and the last time and date medication taken. 9. Bring ID and current insurance cards. 10.Please wear clothes that are easy to get on and off and wear slip-on shoes.  Thank you for allowing us  to care for you!   -- [facility]

## 2023-11-19 NOTE — Progress Notes (Signed)
 Cardiology Office Note:  .   Date:  11/19/2023  ID:  Amber Bennett, DOB 1950-09-09, MRN [id number] PCP: Aldo Hun, MD  [facility] Providers Cardiologist:  Luana Rumple, MD Electrophysiologist:  Will Cortland Ding, MD    History of Present Illness: Amber Bennett   Amber Bennett is a 73 y.o. female with paroxysmal and persistent atrial fibrillation with rapid ventricular response complicated by acute exacerbation of diastolic heart failure on a background history of hypertension, hypercholesterolemia, mild obesity, obstructive sleep apnea, dermatomyositis, type 2 diabetes mellitus.  She complains of shortness of breath with light activity.  Walking consistently makes her short of breath.  Many times she gets short of breath getting dressed, especially if she has to bend over.  She has had documented hypoxemia with walking with oxygen saturations as low as 82%.  She reports using CPAP consistently, but complains of feeling "tired all the time".  She is tired as soon as she wakes up and easily falls asleep throughout the day.  She has not had falls or bleeding problems.  She has not had any focal neurological events.  She is not troubled by palpitations.  She is not coughing a lot.  She did have wheezing simply walking in the hallway in our office today.  The wheezing subsided as soon as she stopped to rest.  At her initial presentation in 2024, when she was having problems with atrial fibrillation with rapid ventricular rates, she had a lot of difficulty with shortness of breath, fatigue and even syncope.  After undergoing ablation 03/13/2023 she had a marked improvement in her quality of life immediately.  She also started treatment with CPAP around the same time and felt she had a lot more energy.  She has not had any new episodes of atrial fibrillation since ablation and in February, at her EP appointment, she was told to stop the amiodarone .  However this medicine is still on her list and she  thinks she is still taking it.  About the same time she started having worsening complaints of dyspnea and began an extensive pulmonary workup.  She had significant exercise-induced hypoxia.  - Chest CT showed right upper lobe consolidation and some clustered nodularity within the anterior medial left upper lung, right middle lobe peripheral bronchiolectasis and focal mucoid impaction, tree-in-bud nodularity of the right middle lobe and the lingula.   - Bronchoscopy 09/19/2023, scanty secretions, mild erythema of the right middle lobe and lingula airways, no endobronchial lesions.  The cytology specimens showed no malignant cells, mixed acute and chronic inflammation.  Aspergillus was found in her BAL, but felt to be nonpathogenic colonizer ([name]) - Extensive serology for autoimmune disease has shown a very weakly positive ANA at 1: 40, negative for more specific autoantibodies.  ESR (9-14) and CRP have not been markedly elevated.   - Pulmonary function tests showed mildly reduced FVC and a mildly reduced DLCO (e.g. roughly 70% of predicted), with normal FEV1 and actually increased FEV1/FVC ratio. - Echocardiogram with saline contrast performed 11/13/2023 did not show any evidence of right-to-left shunt.  Left ventricular systolic function is normal (EF 60 to 65%, global longitudinal strain -23.3%).  Diastolic parameters show pseudonormal mitral inflow based on E/A ratio of 1.2, but the E/e' is at most minimally abnormal (medial 12, lateral 9).  The left atrium is only mildly dilated.  There are no serious valvular abnormalities.  There is no pericardial effusion.  The right ventricular function is normal and the  pulmonary artery pressure could not be estimated.  She just returned from a trip to the beach yesterday.  She had mild ankle edema and the shortness of breath was present even before that trip, but usually the swelling would improve and almost resolved by the next morning after lying in bed  overnight.  After yesterday's long drive home she had severe lower extremity edema.  When she woke up this morning she still had substantial leg edema and it looks to be about 2+ today.  It is symmetrical.  We have previously estimated her "dry weight" to be around 155 pounds on her home scale, 159 pounds on the office scale.  Today in clinic she weighs almost 10 pounds more than that.  She is currently taking furosemide  40 mg daily and potassium chloride  20 mill equivalents daily.  Labs performed just a couple of weeks ago showed excellent renal function and normal electrolytes.    She is very frustrated at her inability to lose true weight and has started treatment with Zepbound.  She is on lipid therapy with ezetimibe  having been intolerant of statins (elevated liver function test with simvastatin, skin rash and myalgia with rosuvastatin).  No evidence of ischemia on Lexiscan  Myoview  July 2024.  She had a coronary calcium score of 0 in 2019  The home sleep study was abnormal (mild OSA with AHI 11.5/hour, but with oxygen desaturation as low as 70%), but she has yet to be scheduled for the CPAP titration testing.    Studies Reviewed: Amber Bennett   EKG Interpretation Date/Time:  Wednesday Nov 19 2023 11:18:05 EDT Ventricular Rate:  64 PR Interval:  230 QRS Duration:  74 QT Interval:  438 QTC Calculation: 451 R Axis:   69  Text Interpretation: Sinus rhythm with 1st degree A-V block When compared with ECG of 27-Aug-2023 15:22, No significant change was found Confirmed by [name] ([id number]) on 11/19/2023 11:20:42 AM    EKG Interpretation Date/Time:  Wednesday Nov 19 2023 11:18:05 EDT Ventricular Rate:  64 PR Interval:  230 QRS Duration:  74 QT Interval:  438 QTC Calculation: 451 R Axis:   69  Text Interpretation: Sinus rhythm with 1st degree A-V block When compared with ECG of 27-Aug-2023 15:22, No significant change was found Confirmed by [name] ([id number]) on 11/19/2023 11:20:42 AM          Echocardiogram 11/13/2023  1. Left ventricular ejection fraction, by estimation, is 60 to 65%. Left  ventricular ejection fraction by 3D volume is 64 %. The left ventricle has  normal function. The left ventricle has no regional wall motion  abnormalities. Left ventricular diastolic   parameters are consistent with Grade II diastolic dysfunction  (pseudonormalization). The average left ventricular global longitudinal  strain is -23.3 %. The global longitudinal strain is normal.   2. Right ventricular systolic function is normal. The right ventricular  size is normal. Tricuspid regurgitation signal is inadequate for assessing  PA pressure.   3. Left atrial size was mildly dilated.   4. Negative bubble study, no evidence for PFO or ASD.   5. The mitral valve is normal in structure. Trivial mitral valve  regurgitation. No evidence of mitral stenosis.   6. The aortic valve is tricuspid. There is mild calcification of the  aortic valve. Aortic valve regurgitation is trivial. No aortic stenosis is  present.   7. The inferior vena cava is normal in size with greater than 50%  respiratory variability, suggesting right atrial pressure of 3 mmHg.  Diastology  LV e' medial:    7.07 cm/s  LV E/e' medial:  12.1  LV e' lateral:   9.36 cm/s  LV E/e' lateral: 9.1  MV Decel Time: 182 msec     MV E velocity: 85.45 cm/s  MV A velocity: 72.05 cm/s  MV E/A ratio:  1.19    Lexiscan  Myoview  01/20/2023:   No ischemia. There is a small mild fixed mid-anterior defect that is most consistent with breast attenuation given normal wall motion. The study is overall low risk due to grossly normal perfusion, normal LV function and no TID. Patient was symptomatic after stress testing, see separate documentation from Dr. Alvis Ba. If clinical concern persists, consider cardiac PET-CT with myocardial blood flow or coronary CTA.   No ST deviation was noted.   LV perfusion is abnormal. Defect 1: There is a  small defect with mild reduction in uptake present in the mid anterior location(s) that is fixed. There is normal wall motion in the defect area. Consistent with artifact caused by breast attenuation.   Left ventricular function is normal. Nuclear stress EF: 69%. The left ventricular ejection fraction is hyperdynamic (>65%). End diastolic cavity size is normal. End systolic cavity size is normal.   Prior study not available for comparison.  Risk Assessment/Calculations:    CHA2DS2-VASc Score = 4   This indicates a 4.8% annual risk of stroke. The patient's score is based upon: CHF History: 1 HTN History: 1 Diabetes History: 0 Stroke History: 0 Vascular Disease History: 0 Age Score: 1 Gender Score: 1         Physical Exam:   VS:  BP 126/82   Pulse 69   Ht 4' 10.5" (1.486 m)   Wt 75.8 kg   LMP 07/01/1980   SpO2 92%   BMI 34.31 kg/m    Wt Readings from Last 3 Encounters:  11/19/23 75.8 kg  11/05/23 72.6 kg  11/04/23 74.3 kg     General: Alert, oriented x3, no distress, mildly obese Head: no evidence of trauma, PERRL, EOMI, no exophtalmos or lid lag, no myxedema, no xanthelasma; normal ears, nose and oropharynx Neck: 6 cm elevation in jugular venous pulsations ; brisk carotid pulses without delay and no carotid bruits Chest: clear to auscultation, no signs of consolidation by percussion or palpation, normal fremitus, symmetrical and full respiratory excursions Cardiovascular: normal position and quality of the apical impulse, regular rhythm, normal first and second heart sounds, no murmurs, rubs or gallops Abdomen: no tenderness or distention, no masses by palpation, no abnormal pulsatility or arterial bruits, normal bowel sounds, no hepatosplenomegaly Extremities: 1+ symmetrical edema of the feet and ankles bilaterally; 2+ radial, ulnar and brachial pulses bilaterally; 2+ right femoral, posterior tibial and dorsalis pedis pulses; 2+ left femoral, posterior tibial and dorsalis  pedis pulses; no subclavian or femoral bruits Neurological: grossly nonfocal Psych: Normal mood and affect   ASSESSMENT AND PLAN: .   CHF: Previous episodes of heart failure decompensation occurred only in association with atrial fibrillation with rapid ventricular sponsor immediately following cardioversion.  She is in normal rhythm today and has not had any atrial fibrillation in months.  She does have clinical findings of hypervolemia with mild jugular venous distention and ankle swelling, but has clear lungs.  On the echocardiogram performed less than a week ago her inferior vena cava was not dilated and the signs of diastolic dysfunction were marginal.  Nevertheless, she is almost 10 pounds above our previous estimation of her "dry weight" of 159  pounds. The severity of hypoxia seems to be well out of proportion to the clinical findings.  All in all, findings are more supportive of right heart failure than biventricular failure, but this is not a clear-cut clinical scenario.  Will check a BNP today.  Scheduled for right heart catheterization Wednesday, 11/26/2023 at 1130 with Dr. Julane Ny (plan bicycle exercise on the Cath Lab table, try to sample right and left upper and lower lobe wedge pressures for any evidence of pulmonary vein stenosis).  Will double the dose of furosemide  to 80 mg twice daily.  She had labs performed 10/24/2023 with normal potassium of 4.4 and creatinine of only 0.49, sodium 140.  We can repeat those labs on the day of her heart catheterization.  Last summer, she has proven to be relatively fragile hemodynamically when treated with diuretics, developing symptomatic hypotension with overdiuresis.  AFib: s/p recent ablation with excellent clinical response.  On anticoagulation.  Has an appointment in the A-fib clinic 04/10/2023.  Amiodarone  was recommended to be stopped at her EP clinic appointment in February, but it still on her medicine list.  Asked her to make sure that she is  no longer taking this medication.  We can also consider reducing the dose of metoprolol .  Pulmonary vein stenosis is considered, but felt to be of low likelihood without current A-fib ablation procedures.  If there is evidence of focal increase in pulmonary wedge pressure, would be best evaluated with CT angiography of the left atrium. Anticoagulation: No serious bleeding problems.  Hold Eliquis  for 48 hours before the right heart catheterization.  In the long-term she is probably well served by a Watchman device, which would allow her to take NSAIDs for arthralgias (she cannot take acetaminophen  due to allergy, she prefers to avoid opiates).    She does have a history of remote GI bleeding when she took aspirin  for joint complaints.  HTN: Adequate control on amlodipine , telmisartan and metoprolol . OSA:   Using CPAP but with very prominent complaints of fatigue and daytime hypersomnolence.  I wonder if we are really achieving good control of her sleep apnea, will defer further evaluation to her pulmonary specialist. Obesity: Recently started Zepbound hoping to lose weight. Dermatomyositis: Serologies essentially negative with the exception of borderline ANA 1: 40 and inflammatory markers are low. ILD: Restrictive lung disease and reduced diffusion capacity.  Cause is elusive.  Lung findings were fairly bland on chest CT performed 01/18/2023, with abnormalities developing on the CT from 08/04/2023 and evolving on the CT 09/12/2023.  Let's make sure that she is no longer taking amiodarone .  Possibly will need a lung biopsy.  It would be interesting to see if we can coordinate this with her right heart catheterization so that interruption in her anticoagulant is minimized. Aortic atherosclerosis/coronary calcifications: Described on the most recent chest CT from March 2025.  I looked at the images and think that the scanty calcifications are primarily at the level of the aortic annulus and aortic valve, not in  the coronaries.  Does have mild atherosclerotic plaque in the proximal aortic arch at the level of the great vessels origins and in the descending thoracic aorta normal nuclear stress test less than a year ago and coronary calcium score was 0 in 2019. HLP: Intolerant to statins.  Excellent lipid profile on Praluent .  We can probably stop her ezetimibe .  Informed Consent   Shared Decision Making/Informed Consent The risks [stroke (1 in 1000), death (1 in 1000), kidney failure [usually temporary] (  1 in 500), bleeding (1 in 200), allergic reaction [possibly serious] (1 in 200)], benefits (diagnostic support and management of coronary artery disease) and alternatives of a cardiac catheterization were discussed in detail with Amber Bennett and she is willing to proceed.        Patient Instructions  Medication Instructions:  INCREASE LASIX  TO 80 MG DAILY  STOP AMIODARONE   *If you need a refill on your cardiac medications before your next appointment, please call your pharmacy*  Lab Work: BNP, CBC, BMET TODAY If you have labs (blood work) drawn today and your tests are completely normal, you will receive your results only by: MyChart Message (if you have MyChart) OR A paper copy in the mail If you have any lab test that is abnormal or we need to change your treatment, we will call you to review the results.  Testing/Procedures: Your physician has requested that you have a cardiac catheterization. Cardiac catheterization is used to diagnose and/or treat various heart conditions. Doctors may recommend this procedure for a number of different reasons. The most common reason is to evaluate chest pain. Chest pain can be a symptom of coronary artery disease (CAD), and cardiac catheterization can show whether plaque is narrowing or blocking your heart's arteries. This procedure is also used to evaluate the valves, as well as measure the blood flow and oxygen levels in different parts of your heart. For further  information please visit [email or web address] Please follow instruction sheet, as given.  Follow-Up: At [facility], you and your health needs are our priority.  As part of our continuing mission to provide you with exceptional heart care, our providers are all part of one team.  This team includes your primary Cardiologist (physician) and Advanced Practice Providers or APPs (Physician Assistants and Nurse Practitioners) who all work together to provide you with the care you need, when you need it.  Your next appointment:   4 month(s)  Provider:   Luana Rumple, MD      Cardiac/Peripheral Catheterization   You are scheduled for a Cardiac Catheterization on Wednesday, May 28 with Dr. Jules Oar.  1. Please arrive at the [facility] (Main Entrance A) at [facility]: [address] [place], [place] [address] at 9:30 AM (This time is 2 hour(s) before your procedure to ensure your preparation).   Free valet parking service is available. You will check in at ADMITTING. The support person will be asked to wait in the waiting room.  It is OK to have someone drop you off and come back when you are ready to be discharged.        Special note: Every effort is made to have your procedure done on time. Please understand that emergencies sometimes delay scheduled procedures.  2. Diet: Do not eat solid foods after midnight.  You may have clear liquids until 5 AM the day of the procedure.  3. Labs: TODAY  4. Medication instructions in preparation for your procedure:   Contrast Allergy: No  Stop taking Eliquis  (Apixiban) on Monday, May 25.  On the morning of your procedure, take Aspirin  81 mg and any morning medicines NOT listed above.  You may use sips of water.  5. Plan to go home the same day, you will only stay overnight if medically necessary. 6. You MUST have a responsible adult to drive you home. 7. An adult MUST be with you the first 24 hours after you arrive  home. 8. Bring a current list of  your medications, and the last time and date medication taken. 9. Bring ID and current insurance cards. 10.Please wear clothes that are easy to get on and off and wear slip-on shoes.  Thank you for allowing us  to care for you!   -- [facility]   Signed, Luana Rumple, MD

## 2023-11-19 NOTE — H&P (View-Only) (Signed)
 Cardiology Office Note:  .   Date:  11/19/2023  ID:  Amber Bennett, DOB 1950-09-09, MRN [id number] PCP: Aldo Hun, MD  [facility] Providers Cardiologist:  Luana Rumple, MD Electrophysiologist:  Will Cortland Ding, MD    History of Present Illness: Amber Bennett   Amber Bennett is a 73 y.o. female with paroxysmal and persistent atrial fibrillation with rapid ventricular response complicated by acute exacerbation of diastolic heart failure on a background history of hypertension, hypercholesterolemia, mild obesity, obstructive sleep apnea, dermatomyositis, type 2 diabetes mellitus.  She complains of shortness of breath with light activity.  Walking consistently makes her short of breath.  Many times she gets short of breath getting dressed, especially if she has to bend over.  She has had documented hypoxemia with walking with oxygen saturations as low as 82%.  She reports using CPAP consistently, but complains of feeling "tired all the time".  She is tired as soon as she wakes up and easily falls asleep throughout the day.  She has not had falls or bleeding problems.  She has not had any focal neurological events.  She is not troubled by palpitations.  She is not coughing a lot.  She did have wheezing simply walking in the hallway in our office today.  The wheezing subsided as soon as she stopped to rest.  At her initial presentation in 2024, when she was having problems with atrial fibrillation with rapid ventricular rates, she had a lot of difficulty with shortness of breath, fatigue and even syncope.  After undergoing ablation 03/13/2023 she had a marked improvement in her quality of life immediately.  She also started treatment with CPAP around the same time and felt she had a lot more energy.  She has not had any new episodes of atrial fibrillation since ablation and in February, at her EP appointment, she was told to stop the amiodarone .  However this medicine is still on her list and she  thinks she is still taking it.  About the same time she started having worsening complaints of dyspnea and began an extensive pulmonary workup.  She had significant exercise-induced hypoxia.  - Chest CT showed right upper lobe consolidation and some clustered nodularity within the anterior medial left upper lung, right middle lobe peripheral bronchiolectasis and focal mucoid impaction, tree-in-bud nodularity of the right middle lobe and the lingula.   - Bronchoscopy 09/19/2023, scanty secretions, mild erythema of the right middle lobe and lingula airways, no endobronchial lesions.  The cytology specimens showed no malignant cells, mixed acute and chronic inflammation.  Aspergillus was found in her BAL, but felt to be nonpathogenic colonizer ([name]) - Extensive serology for autoimmune disease has shown a very weakly positive ANA at 1: 40, negative for more specific autoantibodies.  ESR (9-14) and CRP have not been markedly elevated.   - Pulmonary function tests showed mildly reduced FVC and a mildly reduced DLCO (e.g. roughly 70% of predicted), with normal FEV1 and actually increased FEV1/FVC ratio. - Echocardiogram with saline contrast performed 11/13/2023 did not show any evidence of right-to-left shunt.  Left ventricular systolic function is normal (EF 60 to 65%, global longitudinal strain -23.3%).  Diastolic parameters show pseudonormal mitral inflow based on E/A ratio of 1.2, but the E/e' is at most minimally abnormal (medial 12, lateral 9).  The left atrium is only mildly dilated.  There are no serious valvular abnormalities.  There is no pericardial effusion.  The right ventricular function is normal and the  pulmonary artery pressure could not be estimated.  She just returned from a trip to the beach yesterday.  She had mild ankle edema and the shortness of breath was present even before that trip, but usually the swelling would improve and almost resolved by the next morning after lying in bed  overnight.  After yesterday's long drive home she had severe lower extremity edema.  When she woke up this morning she still had substantial leg edema and it looks to be about 2+ today.  It is symmetrical.  We have previously estimated her "dry weight" to be around 155 pounds on her home scale, 159 pounds on the office scale.  Today in clinic she weighs almost 10 pounds more than that.  She is currently taking furosemide  40 mg daily and potassium chloride  20 mill equivalents daily.  Labs performed just a couple of weeks ago showed excellent renal function and normal electrolytes.    She is very frustrated at her inability to lose true weight and has started treatment with Zepbound.  She is on lipid therapy with ezetimibe  having been intolerant of statins (elevated liver function test with simvastatin, skin rash and myalgia with rosuvastatin).  No evidence of ischemia on Lexiscan  Myoview  July 2024.  She had a coronary calcium score of 0 in 2019  The home sleep study was abnormal (mild OSA with AHI 11.5/hour, but with oxygen desaturation as low as 70%), but she has yet to be scheduled for the CPAP titration testing.    Studies Reviewed: Amber Bennett   EKG Interpretation Date/Time:  Wednesday Nov 19 2023 11:18:05 EDT Ventricular Rate:  64 PR Interval:  230 QRS Duration:  74 QT Interval:  438 QTC Calculation: 451 R Axis:   69  Text Interpretation: Sinus rhythm with 1st degree A-V block When compared with ECG of 27-Aug-2023 15:22, No significant change was found Confirmed by [name] ([id number]) on 11/19/2023 11:20:42 AM    EKG Interpretation Date/Time:  Wednesday Nov 19 2023 11:18:05 EDT Ventricular Rate:  64 PR Interval:  230 QRS Duration:  74 QT Interval:  438 QTC Calculation: 451 R Axis:   69  Text Interpretation: Sinus rhythm with 1st degree A-V block When compared with ECG of 27-Aug-2023 15:22, No significant change was found Confirmed by [name] ([id number]) on 11/19/2023 11:20:42 AM          Echocardiogram 11/13/2023  1. Left ventricular ejection fraction, by estimation, is 60 to 65%. Left  ventricular ejection fraction by 3D volume is 64 %. The left ventricle has  normal function. The left ventricle has no regional wall motion  abnormalities. Left ventricular diastolic   parameters are consistent with Grade II diastolic dysfunction  (pseudonormalization). The average left ventricular global longitudinal  strain is -23.3 %. The global longitudinal strain is normal.   2. Right ventricular systolic function is normal. The right ventricular  size is normal. Tricuspid regurgitation signal is inadequate for assessing  PA pressure.   3. Left atrial size was mildly dilated.   4. Negative bubble study, no evidence for PFO or ASD.   5. The mitral valve is normal in structure. Trivial mitral valve  regurgitation. No evidence of mitral stenosis.   6. The aortic valve is tricuspid. There is mild calcification of the  aortic valve. Aortic valve regurgitation is trivial. No aortic stenosis is  present.   7. The inferior vena cava is normal in size with greater than 50%  respiratory variability, suggesting right atrial pressure of 3 mmHg.  Diastology  LV e' medial:    7.07 cm/s  LV E/e' medial:  12.1  LV e' lateral:   9.36 cm/s  LV E/e' lateral: 9.1  MV Decel Time: 182 msec     MV E velocity: 85.45 cm/s  MV A velocity: 72.05 cm/s  MV E/A ratio:  1.19    Lexiscan  Myoview  01/20/2023:   No ischemia. There is a small mild fixed mid-anterior defect that is most consistent with breast attenuation given normal wall motion. The study is overall low risk due to grossly normal perfusion, normal LV function and no TID. Patient was symptomatic after stress testing, see separate documentation from Dr. Alvis Ba. If clinical concern persists, consider cardiac PET-CT with myocardial blood flow or coronary CTA.   No ST deviation was noted.   LV perfusion is abnormal. Defect 1: There is a  small defect with mild reduction in uptake present in the mid anterior location(s) that is fixed. There is normal wall motion in the defect area. Consistent with artifact caused by breast attenuation.   Left ventricular function is normal. Nuclear stress EF: 69%. The left ventricular ejection fraction is hyperdynamic (>65%). End diastolic cavity size is normal. End systolic cavity size is normal.   Prior study not available for comparison.  Risk Assessment/Calculations:    CHA2DS2-VASc Score = 4   This indicates a 4.8% annual risk of stroke. The patient's score is based upon: CHF History: 1 HTN History: 1 Diabetes History: 0 Stroke History: 0 Vascular Disease History: 0 Age Score: 1 Gender Score: 1         Physical Exam:   VS:  BP 126/82   Pulse 69   Ht 4' 10.5" (1.486 m)   Wt 75.8 kg   LMP 07/01/1980   SpO2 92%   BMI 34.31 kg/m    Wt Readings from Last 3 Encounters:  11/19/23 75.8 kg  11/05/23 72.6 kg  11/04/23 74.3 kg     General: Alert, oriented x3, no distress, mildly obese Head: no evidence of trauma, PERRL, EOMI, no exophtalmos or lid lag, no myxedema, no xanthelasma; normal ears, nose and oropharynx Neck: 6 cm elevation in jugular venous pulsations ; brisk carotid pulses without delay and no carotid bruits Chest: clear to auscultation, no signs of consolidation by percussion or palpation, normal fremitus, symmetrical and full respiratory excursions Cardiovascular: normal position and quality of the apical impulse, regular rhythm, normal first and second heart sounds, no murmurs, rubs or gallops Abdomen: no tenderness or distention, no masses by palpation, no abnormal pulsatility or arterial bruits, normal bowel sounds, no hepatosplenomegaly Extremities: 1+ symmetrical edema of the feet and ankles bilaterally; 2+ radial, ulnar and brachial pulses bilaterally; 2+ right femoral, posterior tibial and dorsalis pedis pulses; 2+ left femoral, posterior tibial and dorsalis  pedis pulses; no subclavian or femoral bruits Neurological: grossly nonfocal Psych: Normal mood and affect   ASSESSMENT AND PLAN: .   CHF: Previous episodes of heart failure decompensation occurred only in association with atrial fibrillation with rapid ventricular sponsor immediately following cardioversion.  She is in normal rhythm today and has not had any atrial fibrillation in months.  She does have clinical findings of hypervolemia with mild jugular venous distention and ankle swelling, but has clear lungs.  On the echocardiogram performed less than a week ago her inferior vena cava was not dilated and the signs of diastolic dysfunction were marginal.  Nevertheless, she is almost 10 pounds above our previous estimation of her "dry weight" of 159  pounds. The severity of hypoxia seems to be well out of proportion to the clinical findings.  All in all, findings are more supportive of right heart failure than biventricular failure, but this is not a clear-cut clinical scenario.  Will check a BNP today.  Scheduled for right heart catheterization Wednesday, 11/26/2023 at 1130 with Dr. Julane Ny (plan bicycle exercise on the Cath Lab table, try to sample right and left upper and lower lobe wedge pressures for any evidence of pulmonary vein stenosis).  Will double the dose of furosemide  to 80 mg twice daily.  She had labs performed 10/24/2023 with normal potassium of 4.4 and creatinine of only 0.49, sodium 140.  We can repeat those labs on the day of her heart catheterization.  Last summer, she has proven to be relatively fragile hemodynamically when treated with diuretics, developing symptomatic hypotension with overdiuresis.  AFib: s/p recent ablation with excellent clinical response.  On anticoagulation.  Has an appointment in the A-fib clinic 04/10/2023.  Amiodarone  was recommended to be stopped at her EP clinic appointment in February, but it still on her medicine list.  Asked her to make sure that she is  no longer taking this medication.  We can also consider reducing the dose of metoprolol .  Pulmonary vein stenosis is considered, but felt to be of low likelihood without current A-fib ablation procedures.  If there is evidence of focal increase in pulmonary wedge pressure, would be best evaluated with CT angiography of the left atrium. Anticoagulation: No serious bleeding problems.  Hold Eliquis  for 48 hours before the right heart catheterization.  In the long-term she is probably well served by a Watchman device, which would allow her to take NSAIDs for arthralgias (she cannot take acetaminophen  due to allergy, she prefers to avoid opiates).    She does have a history of remote GI bleeding when she took aspirin  for joint complaints.  HTN: Adequate control on amlodipine , telmisartan and metoprolol . OSA:   Using CPAP but with very prominent complaints of fatigue and daytime hypersomnolence.  I wonder if we are really achieving good control of her sleep apnea, will defer further evaluation to her pulmonary specialist. Obesity: Recently started Zepbound hoping to lose weight. Dermatomyositis: Serologies essentially negative with the exception of borderline ANA 1: 40 and inflammatory markers are low. ILD: Restrictive lung disease and reduced diffusion capacity.  Cause is elusive.  Lung findings were fairly bland on chest CT performed 01/18/2023, with abnormalities developing on the CT from 08/04/2023 and evolving on the CT 09/12/2023.  Let's make sure that she is no longer taking amiodarone .  Possibly will need a lung biopsy.  It would be interesting to see if we can coordinate this with her right heart catheterization so that interruption in her anticoagulant is minimized. Aortic atherosclerosis/coronary calcifications: Described on the most recent chest CT from March 2025.  I looked at the images and think that the scanty calcifications are primarily at the level of the aortic annulus and aortic valve, not in  the coronaries.  Does have mild atherosclerotic plaque in the proximal aortic arch at the level of the great vessels origins and in the descending thoracic aorta normal nuclear stress test less than a year ago and coronary calcium score was 0 in 2019. HLP: Intolerant to statins.  Excellent lipid profile on Praluent .  We can probably stop her ezetimibe .  Informed Consent   Shared Decision Making/Informed Consent The risks [stroke (1 in 1000), death (1 in 1000), kidney failure [usually temporary] (  1 in 500), bleeding (1 in 200), allergic reaction [possibly serious] (1 in 200)], benefits (diagnostic support and management of coronary artery disease) and alternatives of a cardiac catheterization were discussed in detail with Ms. Henault and she is willing to proceed.        Patient Instructions  Medication Instructions:  INCREASE LASIX  TO 80 MG DAILY  STOP AMIODARONE   *If you need a refill on your cardiac medications before your next appointment, please call your pharmacy*  Lab Work: BNP, CBC, BMET TODAY If you have labs (blood work) drawn today and your tests are completely normal, you will receive your results only by: MyChart Message (if you have MyChart) OR A paper copy in the mail If you have any lab test that is abnormal or we need to change your treatment, we will call you to review the results.  Testing/Procedures: Your physician has requested that you have a cardiac catheterization. Cardiac catheterization is used to diagnose and/or treat various heart conditions. Doctors may recommend this procedure for a number of different reasons. The most common reason is to evaluate chest pain. Chest pain can be a symptom of coronary artery disease (CAD), and cardiac catheterization can show whether plaque is narrowing or blocking your heart's arteries. This procedure is also used to evaluate the valves, as well as measure the blood flow and oxygen levels in different parts of your heart. For further  information please visit [email or web address] Please follow instruction sheet, as given.  Follow-Up: At [facility], you and your health needs are our priority.  As part of our continuing mission to provide you with exceptional heart care, our providers are all part of one team.  This team includes your primary Cardiologist (physician) and Advanced Practice Providers or APPs (Physician Assistants and Nurse Practitioners) who all work together to provide you with the care you need, when you need it.  Your next appointment:   4 month(s)  Provider:   Luana Rumple, MD      Cardiac/Peripheral Catheterization   You are scheduled for a Cardiac Catheterization on Wednesday, May 28 with Dr. Jules Oar.  1. Please arrive at the [facility] (Main Entrance A) at [facility]: [address] [place], [place] [address] at 9:30 AM (This time is 2 hour(s) before your procedure to ensure your preparation).   Free valet parking service is available. You will check in at ADMITTING. The support person will be asked to wait in the waiting room.  It is OK to have someone drop you off and come back when you are ready to be discharged.        Special note: Every effort is made to have your procedure done on time. Please understand that emergencies sometimes delay scheduled procedures.  2. Diet: Do not eat solid foods after midnight.  You may have clear liquids until 5 AM the day of the procedure.  3. Labs: TODAY  4. Medication instructions in preparation for your procedure:   Contrast Allergy: No  Stop taking Eliquis  (Apixiban) on Monday, May 25.  On the morning of your procedure, take Aspirin  81 mg and any morning medicines NOT listed above.  You may use sips of water.  5. Plan to go home the same day, you will only stay overnight if medically necessary. 6. You MUST have a responsible adult to drive you home. 7. An adult MUST be with you the first 24 hours after you arrive  home. 8. Bring a current list of  your medications, and the last time and date medication taken. 9. Bring ID and current insurance cards. 10.Please wear clothes that are easy to get on and off and wear slip-on shoes.  Thank you for allowing us  to care for you!   -- [facility]   Signed, Luana Rumple, MD

## 2023-11-20 ENCOUNTER — Ambulatory Visit: Payer: Self-pay | Admitting: Cardiovascular Disease

## 2023-11-20 DIAGNOSIS — I272 Pulmonary hypertension, unspecified: Secondary | ICD-10-CM

## 2023-11-20 DIAGNOSIS — J984 Other disorders of lung: Secondary | ICD-10-CM

## 2023-11-20 LAB — CBC
Hematocrit: 38.3 % (ref 34.0–46.6)
Hemoglobin: 13 g/dL (ref 11.1–15.9)
MCH: 32.3 pg (ref 26.6–33.0)
MCHC: 33.9 g/dL (ref 31.5–35.7)
MCV: 95 fL (ref 79–97)
Platelets: 164 10*3/uL (ref 150–450)
RBC: 4.03 x10E6/uL (ref 3.77–5.28)
RDW: 11.6 % — ABNORMAL LOW (ref 11.7–15.4)
WBC: 6.1 10*3/uL (ref 3.4–10.8)

## 2023-11-20 LAB — BASIC METABOLIC PANEL WITH GFR
BUN/Creatinine Ratio: 21 (ref 12–28)
BUN: 13 mg/dL (ref 8–27)
CO2: 24 mmol/L (ref 20–29)
Calcium: 8.9 mg/dL (ref 8.7–10.3)
Chloride: 99 mmol/L (ref 96–106)
Creatinine, Ser: 0.62 mg/dL (ref 0.57–1.00)
Glucose: 101 mg/dL — ABNORMAL HIGH (ref 70–99)
Potassium: 4.5 mmol/L (ref 3.5–5.2)
Sodium: 137 mmol/L (ref 134–144)
eGFR: 93 mL/min/{1.73_m2} (ref >59)

## 2023-11-20 LAB — BRAIN NATRIURETIC PEPTIDE: BNP: 224.5 pg/mL — ABNORMAL HIGH (ref 0.0–100.0)

## 2023-11-20 LAB — SPECIMEN STATUS REPORT

## 2023-11-24 NOTE — Progress Notes (Unsigned)
 Chief complaint:   Subjective:  Chief Complaint dyspnea on exertion  Patient ID: Amber Bennett, female    DOB: 1951-02-05, 73 y.o.   MRN: [id number]  HPI   73 year old with former smoker (quit at age 34) seronegative rheumatoid arthritis dermatomyositis, followed by [name] and on Plaquenil  and prednisone (currently being held) also with a seizure disorder that is currently managed with phenobarbital , chronic diarrhea, who apparently had pneumonia after her total hip replacement with Amber Bennett in 2023. She also has  history of PAF and CHF having undergone an ablation for the former when it was thought to be driving her dyspnea. She does have some other confounders with recent weight gain from 140 to 167 # and diagnosis of OSA and I would query whether there might be an obesity hypoventilation syndrome restrictive lung disease (which was seen on PFTS) from the weight gain.    The italics below is from my original note:  I have reviewed imaging available prior to 2023 including a CT coronary study done in 2019.  This study seemed completely normal with regards to the lung parenchyma.     She then underwent THR by Dr. Lucienne Ryder in early August 2023.   He then had developed some dyspnea on exertion as well as coughing with phlegm production.  Was concerns for PE and a CT angiogram was ordered on February 18, 2022   CTA on [id number] showed:   IMPRESSION: 1. No evidence of pulmonary embolus. 2. Mild right upper lobe consolidation with surrounding ground-glass, likely infectious or inflammatory. Recommend follow-up chest CT in 3 months to ensure resolution. 3. Aortic Atherosclerosis (ICD10-I70.0).   She appears to have been prescribed a 5 day course of levaquin at this point in time.   He says that her symptoms improved in particular she had been having phlegm that she was producing and this did improve with the levofloxacin.  However the dyspnea on exertion remained.   Continues   struggle with dyspnea on exertion ever since then.   She has struggled with recurrent sinus infections which have typically responded to antibacterial antibiotics.   She however is continue to have trouble with the dyspnea on exertion.   She has a CPAP machine at home which at one point was not fitting correctly and which was causing some hypoxia based on her home sleep study.   This has been remedied.  She does however suffer from sneezing when she wears the CPAP religiously has fairly uncontrolled sneezing at least once per week.   She has seen Dr. Kozlow from Rheumatology who noted that her symptoms of DOE actually do NOT manifest themselves when she does aquatic exercises which again raises ? Could the weight and potential obesity ventilation by the issue and getting rid of some gravity helps here?     CT lung [id number] showed   IMPRESSION: Minimal ground-glass opacities in the right upper lung may represent scarring from prior inflammatory changes in this area.   She then had a CT scan  t in January 30th of 2025 showed:   MPRESSION: 1. New clustered heterogeneous airspace opacity and nodularity in the inferior right upper lobe measuring 2.4 x 1.6 cm. Additional tiny nodules and small consolidations in the medial left upper lobe and in the right middle lobe. Findings are most consistent with multifocal atypical infection. 2. Mild, bland appearing scarring of the lung bases. No evidence of fibrotic interstitial lung disease. 3. Hepatic steatosis. Coarse contour of the liver,  suggestive of cirrhosis. 4. Subacute, partially callused fracture of the anterior right fifth rib.   She had been on cefdnir prior to that scan but I am not sure what antibacterial antibiotic she received after that.   CT in March 2025 showed:    IMPRESSION: 1. Interval resolution of airspace consolidation within the basilar right upper lobe. In the area of the previous consolidative change there is a  cluster of lung nodules which measure up to 5 mm. The clustered appearance of these nodules strongly suggest sequelae of an inflammatory or infectious process. Most likely chronic atypical infection such as MAI 2. Interval resolution of clustered nodularity within the anteromedial left upper lobe. 3. Stable appearance of peripheral bronchiolectasis with peripheral clustered tree-in-bud nodularity within the right middle lobe and posterior lingula. Findings are also favored to represent sequelae of chronic atypical mycobacterial infection. 4. Stable 5 mm peripheral subpleural nodule in the posterolateral right middle lobe. 5. Coronary artery calcifications noted. 6.  Aortic Atherosclerosis (ICD10-I70.0).   She underwent bronchoscopy with BAL with Dr. Diania Fortes on [phone]   He found    - Scant thick clear mucous scattered throughout both bronchial trees - Possible excessive dynamic airway collapse - Mild erythema of the RML and Lingula entrances - No endobronchial lesions noted   BAL were sent for cultures and on fungal cultures from 2 BAL specimens were Aspergillus fumigatus was isolated.     She more recently was rx ciprofloxacin  and flagyl  and later augmentin, in this case for possible parotitis/sinusitis with worsening of her frequency of loose stools. Her baseline is to have about 8 loose bowel movements per day.Amber Bennett She was seen by Dr. Bridgett Camps who sent C difficile PCR on stool that was + though I dont see that a C difficile toxin was sent. She has now been placed on vancomycin  orally.Her symptoms have not improved on vancomycin .   I am skeptical that C difficile is driving pathology in her. I would think that the augmentin more likely just in itself caused worsening of her chronic diarrhea rather than causing c difficile colitis.   As of her weight she had success with Ozempic  and Mounjaro  but then when she was no longer diabetic the medication was stopped and she regained her weight she  is about to go on a new drug to help with this and she certainly has multiple indications for need for weight loss drug including NASH, obstructive sleep apnea process possible obesity hypoventilation syndrome and diabetes.   Discussed the use of AI scribe software for clinical note transcription with the patient, who gave verbal consent to proceed.  History of Present Illness   TARRYN BOGDAN is a 73 year old female with dermatomyositis, bronchectasis, CHF, OSA, lung nodules with aspergillus growing on cultures from BAL, who presents with difficulty breathing and oxygen dependency.  She experiences progressive dyspnea, now requiring supplemental oxygen. Oxygen saturation is typically 95% but drops to 86% with exertion. She has significant fatigue and shortness of breath with minimal activity.  She has a history of Aspergillus colonization in her lungs, identified during bronchoscopy, and has not received treatment for dermatomyositis due to this concern.  She was recently taken off amiodarone , which can cause interstitial lung disease.  She has had increased phlegm production for the past eight months without a significant cough. Chronic diarrhea resolved with vancomycin  treatment.   The cause of her DOE is a bit elusive still though ILD perhaps due to DM or even amiodarone  are possible.  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   Carpal tunnel syndrome    Cervical radiculitis    Chronic diarrhea 10/24/2023   Collar bone fracture    Depression    Dermatomyositis (HCC)    Diabetes mellitus without complication (HCC)    Dysrhythmia     Elevated liver enzymes    External hemorrhoids    Fatty liver    Fibromyalgia    Fibromyalgia affecting hand 08/2022   bilateral wrist and hands   Hematuria    negative evaluation   High cholesterol    HTN (hypertension)    Internal hemorrhoids    Laryngitis    Lower back pain    Macular degeneration    Nonalcoholic steatohepatitis (NASH)    Nonalcoholic steatohepatitis (NASH)    Osteopenia    Left side hip   Seizures (HCC)    09/07/2019 pt reports last seizure in 2014   Tubular adenoma of colon     Past Surgical History:  Procedure Laterality Date   ABDOMINAL HYSTERECTOMY  1982   ADENOIDECTOMY     APPENDECTOMY     ATRIAL FIBRILLATION ABLATION N/A 03/13/2023   Procedure: ATRIAL FIBRILLATION ABLATION;  Surgeon: Lei Pump, MD;  Location: [facility];  Service: Cardiovascular;  Laterality: N/A;   BACK SURGERY     BACK SURGERY  07/2015   BRONCHIAL WASHINGS  09/19/2023   Procedure: IRRIGATION, BRONCHUS;  Surgeon: Wilfredo Hanly, MD;  Location: [facility] ENDOSCOPY;  Service: Pulmonary;;   CARPAL TUNNEL RELEASE     CATARACT EXTRACTION, BILATERAL     CHOLECYSTECTOMY  2003   COLONOSCOPY     EXPLORATORY LAPAROTOMY     FLEXIBLE BRONCHOSCOPY Bilateral 09/19/2023   Procedure: BRONCHOSCOPY, FLEXIBLE;  Surgeon: Wilfredo Hanly, MD;  Location: [facility] ENDOSCOPY;  Service: Pulmonary;  Laterality: Bilateral;  bronchoscopy for BAL   LAMINECTOMY     SHOULDER SURGERY Right 2000   SINOSCOPY     TONSILLECTOMY     TOTAL HIP ARTHROPLASTY Right 02/12/2022   Procedure: RIGHT TOTAL HIP ARTHROPLASTY ANTERIOR APPROACH;  Surgeon: Arnie Lao, MD;  Location: [facility] OR;  Service: Orthopedics;  Laterality: Right;    Family History  Problem Relation Age of Onset   Cancer Mother        Cancer of the bone marrow   Heart attack Mother    Stroke Sister    Hypertension Sister    Diabetes Sister    Colon cancer Maternal Aunt    Seizures Neg Hx    Esophageal cancer Neg Hx    Rectal  cancer Neg Hx    Stomach cancer Neg Hx       Social History   Socioeconomic History   Marital status: Married    Spouse name: Not on file   Number of children: 2   Years of education: 14   Highest education level: Not on file  Occupational History   Occupation: Employed    Comment: Works for [employer], [place] and [employer]  Tobacco Use   Smoking status:  Former    Current packs/day: 0.00    Types: Cigarettes    Quit date: 07/01/1978    Years since quitting: 45.4   Smokeless tobacco: Never   Tobacco comments:    Former smoker 04/10/23  Vaping Use   Vaping status: Never Used  Substance and Sexual Activity   Alcohol  use: Yes    Alcohol /week: 7.0 standard drinks of alcohol     Types: 7 Glasses of wine per week    Comment: 1 glass of white wine nightly 04/10/23   Drug use: No   Sexual activity: Yes    Partners: Male    Birth control/protection: Surgical    Comment: TAH  Other Topics Concern   Not on file  Social History Narrative   Patient lives at home with her husband Loretta Romp).   Retired.   Education two years of business college.   Right handed.   Caffeine     Social Drivers of Corporate investment banker Strain: Low Risk  (06/17/2018)   Received from [facility], [facility]   Overall Financial Resource Strain (CARDIA)    Difficulty of Paying Living Expenses: Not hard at all  Food Insecurity: No Food Insecurity (06/17/2018)   Received from [facility], [facility]   Hunger Vital Sign    Worried About Running Out of Food in the Last Year: Never true    Ran Out of Food in the Last Year: Never true  Transportation Needs: No Transportation Needs (06/17/2018)   Received from [facility], [facility]   [facility] - Transportation    In the past 12 months, has lack of transportation kept you from medical appointments or from getting medications?: No    Lack of  Transportation (Non-Medical): No  Physical Activity: Not on file  Stress: No Stress Concern Present (06/17/2018)   Received from [facility], [facility]   [employer] of Occupational Health - Occupational Stress Questionnaire    Feeling of Stress : Only a little  Social Connections: Unknown (06/17/2018)   Received from [facility], [facility]   Social Connection and Isolation Panel [NHANES]    Frequency of Communication with Friends and Family: Not on file    Frequency of Social Gatherings with Friends and Family: Not on file    Attends Religious Services: Not on file    Active Member of Clubs or Organizations: Not on file    Attends Banker Meetings: Not on file    Marital Status: Married    Allergies  Allergen Reactions   Aspirin  Other (See Comments)    gi bleeding in 2005   Cefdinir Other (See Comments)    Other Reaction(s): hives, itching   Colesevelam Other (See Comments)    Other Reaction(s): has never tried it but would not be a good choice as all seizure meds would have to be taken at least four hours prior to the dose and with her regimen that just won't be possible.   Ezetimibe -Simvastatin Other (See Comments)    Other Reaction(s): elevated LFT   Penicillin G Benzathine Itching   Rosuvastatin     Other Reaction(s): skin rash and myalgia   Wound Dressing Adhesive Other (See Comments)   Latex Itching    Rash      Current Outpatient Medications:    acetaminophen  (TYLENOL ) 500 MG tablet, Take 500 mg by mouth every 6 (six)  hours as needed for moderate pain (pain score 4-6) or mild pain (pain score 1-3)., Disp: , Rfl:    Alirocumab  (PRALUENT ) 150 MG/ML SOAJ, Inject 1 mL (150 mg total) into the skin every 14 (fourteen) days., Disp: 6 mL, Rfl: 3   amLODipine  (NORVASC ) 2.5 MG tablet, Take 1 tablet (2.5 mg total) by mouth at bedtime., Disp: 90 tablet, Rfl: 3   apixaban  (ELIQUIS ) 5 MG  TABS tablet, Take 1 tablet (5 mg total) by mouth 2 (two) times daily., Disp: 180 tablet, Rfl: 1   Ascorbic Acid (VITAMIN C) 1000 MG tablet, Take 1,000 mg by mouth daily. Airborne emergen c, Disp: , Rfl:    beclomethasone (QVAR  REDIHALER) 80 MCG/ACT inhaler, Inhale 1 puff into the lungs 2 (two) times daily., Disp: 31.8 g, Rfl: 1   Calcium Carb-Cholecalciferol  (CALTRATE 600+D3 PO), Take 1 tablet by mouth daily., Disp: , Rfl:    Cholecalciferol  (VITAMIN D ) 50 MCG (2000 UT) tablet, Take 2,000 Units by mouth daily., Disp: , Rfl:    Coenzyme Q10 (CO Q 10 PO), Take 200 mg by mouth daily., Disp: , Rfl:    colestipol  (COLESTID ) 1 g tablet, Take 2 tablets (2 g total) by mouth 2 (two) times daily., Disp: 180 tablet, Rfl: 0   cyclobenzaprine  (FLEXERIL ) 5 MG tablet, Take 1 tablet (5 mg total) by mouth every 8 (eight) hours as needed., Disp: 30 tablet, Rfl: 1   diphenoxylate -atropine  (LOMOTIL ) 2.5-0.025 MG tablet, Take 1-2 tablets by mouth every 4 hours as needed for diarrhea/loose stools, Disp: 90 tablet, Rfl: 1   DULoxetine  (CYMBALTA ) 30 MG capsule, Take 1 capsule (30 mg total) by mouth every evening., Disp: 90 capsule, Rfl: 4   ezetimibe  (ZETIA ) 10 MG tablet, Take 10 mg by mouth every evening., Disp: , Rfl:    furosemide  (LASIX ) 40 MG tablet, Take 2 tablets (80 mg total) by mouth daily. (Patient taking differently: Take 40 mg by mouth 2 (two) times daily.), Disp: 90 tablet, Rfl: 3   gabapentin  (NEURONTIN ) 800 MG tablet, Take 1 tablet (800 mg total) by mouth 2 (two) times daily., Disp: 180 tablet, Rfl: 3   ibandronate (BONIVA) 150 MG tablet, Take 150 mg by mouth every 30 (thirty) days., Disp: , Rfl:    ipratropium (ATROVENT ) 0.03 % nasal spray, Place 2 sprays into both nostrils every 12 (twelve) hours., Disp: 30 mL, Rfl: 4   ipratropium-albuterol  (DUONEB) 0.5-2.5 (3) MG/3ML SOLN, Take 3 mLs by nebulization 3 (three) times daily as needed., Disp: 360 mL, Rfl: 5   loratadine  (CLARITIN ) 10 MG tablet, Take 1 tablet  (10 mg total) by mouth daily as needed for allergies (Can take an extra dose during flare ups.). (Patient taking differently: Take 10 mg by mouth daily.), Disp: 90 tablet, Rfl: 1   metoprolol  tartrate (LOPRESSOR ) 50 MG tablet, Take 1 tablet (50 mg total) by mouth 2 (two) times daily., Disp: , Rfl:    mometasone  (ASMANEX ) 220 MCG/ACT inhaler, Inhale 1 puff into the lungs 2 (two) times daily., Disp: 3 each, Rfl: 1   Multiple Vitamins-Minerals (MULTIVITAMIN PO), Take 1 tablet by mouth daily., Disp: , Rfl:    Multiple Vitamins-Minerals (PRESERVISION AREDS PO), Take 1 capsule by mouth daily., Disp: , Rfl:    Olopatadine-Mometasone  (RYALTRIS ) 665-25 MCG/ACT SUSP, Place 2 sprays into the nose 2 (two) times daily., Disp: 87 g, Rfl: 1   omeprazole (PRILOSEC) 20 MG capsule, Take 20 mg by mouth 2 (two) times daily before a meal., Disp: , Rfl: 3  PHENobarbital  (LUMINAL) 97.2 MG tablet, TAKE 1 AND 1/2 TABLETS BY MOUTH EVERY DAY, Disp: 135 tablet, Rfl: 2   potassium chloride  SA (KLOR-CON  M) 20 MEQ tablet, While taking 40 mg of Furosemide , take Potassium Chloride  20 meq twice a day. When you decrease Furosemide  to 20 mg, decrease Potassium Chloride  to 20 meq once a day., Disp: 180 tablet, Rfl: 3   PRALUENT  150 MG/ML SOAJ, Inject 150 mg into the skin every 14 (fourteen) days., Disp: , Rfl:    pyridoxine  (B-6) 100 MG tablet, Take 100 mg by mouth daily., Disp: , Rfl:    telmisartan (MICARDIS) 40 MG tablet, Take 40 mg by mouth daily., Disp: , Rfl:    topiramate  (TOPAMAX ) 50 MG tablet, 1 tablet (50 mg total) in the morning AND 2 tablets (100 mg total) every evening., Disp: 270 tablet, Rfl: 2   vitamin E 400 UNIT capsule, Take 400 Units by mouth daily., Disp: , Rfl:    ZEPBOUND 2.5 MG/0.5ML Pen, Inject 2.5 mg into the skin once a week., Disp: , Rfl:     Review of Systems  Constitutional:  Negative for activity change, appetite change, chills, diaphoresis, fatigue, fever and unexpected weight change.  HENT:  Negative  for congestion, rhinorrhea, sinus pressure, sneezing, sore throat and trouble swallowing.   Eyes:  Negative for photophobia and visual disturbance.  Respiratory:  Positive for shortness of breath. Negative for cough, chest tightness, wheezing and stridor.   Cardiovascular:  Positive for leg swelling. Negative for chest pain and palpitations.  Gastrointestinal:  Negative for abdominal distention, abdominal pain, anal bleeding, blood in stool, constipation, diarrhea, nausea and vomiting.  Genitourinary:  Negative for difficulty urinating, dysuria, flank pain and hematuria.  Musculoskeletal:  Positive for arthralgias and joint swelling. Negative for back pain, gait problem and myalgias.  Skin:  Negative for color change, pallor, rash and wound.  Neurological:  Negative for dizziness, tremors, weakness and light-headedness.  Hematological:  Negative for adenopathy. Does not bruise/bleed easily.  Psychiatric/Behavioral:  Negative for agitation, behavioral problems, confusion, decreased concentration, dysphoric mood, hallucinations and sleep disturbance. The patient is not nervous/anxious and is not hyperactive.        Objective:   Physical Exam Constitutional:      General: She is not in acute distress.    Appearance: Normal appearance. She is well-developed. She is not ill-appearing or diaphoretic.  HENT:     Head: Normocephalic and atraumatic.     Right Ear: Hearing and external ear normal.     Left Ear: Hearing and external ear normal.     Nose: No nasal deformity or rhinorrhea.  Eyes:     General: No scleral icterus.    Conjunctiva/sclera: Conjunctivae normal.     Right eye: Right conjunctiva is not injected.     Left eye: Left conjunctiva is not injected.     Pupils: Pupils are equal, round, and reactive to light.  Neck:     Vascular: No JVD.  Cardiovascular:     Rate and Rhythm: Normal rate and regular rhythm.     Heart sounds: Normal heart sounds, S1 normal and S2 normal. No  murmur heard.    No friction rub. No gallop.  Pulmonary:     Effort: No respiratory distress.     Breath sounds: No stridor. No wheezing or rales.  Abdominal:     General: There is no distension.     Palpations: Abdomen is soft.  Musculoskeletal:        General: Normal  range of motion.     Right shoulder: Normal.     Left shoulder: Normal.     Cervical back: Normal range of motion and neck supple.     Right hip: Normal.     Left hip: Normal.     Right knee: Normal.     Left knee: Normal.  Lymphadenopathy:     Head:     Right side of head: No submandibular, preauricular or posterior auricular adenopathy.     Left side of head: No submandibular, preauricular or posterior auricular adenopathy.     Cervical: No cervical adenopathy.     Right cervical: No superficial or deep cervical adenopathy.    Left cervical: No superficial or deep cervical adenopathy.  Skin:    General: Skin is warm and dry.     Coloration: Skin is not pale.     Findings: No abrasion, bruising, ecchymosis, erythema, lesion or rash.     Nails: There is no clubbing.  Neurological:     Mental Status: She is alert and oriented to person, place, and time.     Sensory: No sensory deficit.     Coordination: Coordination normal.     Gait: Gait normal.  Psychiatric:        Attention and Perception: She is attentive.        Mood and Affect: Mood normal.        Speech: Speech normal.        Behavior: Behavior normal. Behavior is cooperative.        Thought Content: Thought content normal.        Judgment: Judgment normal.           Assessment & Plan:   Assessment and Plan    DOE:   Etiology unclear; possible causes include dermatomyositis or amiodarone . Amiodarone  discontinued. Bronchiectasis, CHF, obesity hypoventilation syndrome  She does have some mucous production but not much cough to speak of and no systemic symptoms other than related to her AI disease   --will message Dr. Diania Fortes re thoughts on  lung biopsy it could help pin downy the diagnosis but ? if could get a good one via bronchoscopy or whether would need thoracoscopy surgery --empiric immunosuppression could also be give with reversible agents such as high dose steroids but keeping in mind her colonization with aspergillus  --perhaps cardiac cath will be revealing --she also has appt with [facility] ILD specialist in September    Dermatomyositis Diagnosed in 2010, confirmed by biopsy. Currently untreated due concerns re the Aspergillus colonization  Immunosuppressive therapy may be needeated to interstitial lung disease. - Discuss eith Dr. Ebbie Goldmann after her assessment.   Aspergillus colonization Isolated during bronchoscopy, pathogenicity unclear. Risk of exacerbation with immunosuppressive therapy. Antifungal treatment indicated if pathogenic. - Monitor for signs of invasive Aspergillus infection. - Consider antifungal therapy if lung biopsy indicates pathogenic Aspergillus.  Diastolic dysfunction Present but not primary cause of symptoms in view of cardiology. Edema improved with increased Lasix  dosage. - Continue current Lasix  dosage as prescribed by cardiologist. - Follow up with cardiologist as needed.  Pain in hands wrists and back mainly due to her DM Pain in hands, wrists, and back. Hand and wrist pain may be related to dermatomyositis. Back pain etiology unclear. --followup with Dr. Ebbie Goldmann  Follow-up Follow-up plans discussed to monitor progress and adjust treatment as needed. Coordination with specialists is ongoing. - Schedule follow-up appointment in June or July. - Coordinate with Dr. Ebbie Goldmann and her son for ongoing  management. - Contact Dr. Ebbie Goldmann after her assessment.

## 2023-11-25 ENCOUNTER — Ambulatory Visit (INDEPENDENT_AMBULATORY_CARE_PROVIDER_SITE_OTHER): Payer: Self-pay | Admitting: Infectious Disease

## 2023-11-25 ENCOUNTER — Other Ambulatory Visit: Payer: Self-pay

## 2023-11-25 VITALS — BP 137/85 | HR 83 | Temp 97.0°F | Ht 58.5 in | Wt 165.0 lb

## 2023-11-25 DIAGNOSIS — B4489 Other forms of aspergillosis: Secondary | ICD-10-CM | POA: Diagnosis not present

## 2023-11-25 DIAGNOSIS — I5032 Chronic diastolic (congestive) heart failure: Secondary | ICD-10-CM | POA: Diagnosis not present

## 2023-11-25 DIAGNOSIS — M3313 Other dermatomyositis without myopathy: Secondary | ICD-10-CM | POA: Diagnosis not present

## 2023-11-25 DIAGNOSIS — J849 Interstitial pulmonary disease, unspecified: Secondary | ICD-10-CM

## 2023-11-25 DIAGNOSIS — M797 Fibromyalgia: Secondary | ICD-10-CM | POA: Diagnosis not present

## 2023-11-25 DIAGNOSIS — J479 Bronchiectasis, uncomplicated: Secondary | ICD-10-CM | POA: Diagnosis not present

## 2023-11-25 DIAGNOSIS — M1991 Primary osteoarthritis, unspecified site: Secondary | ICD-10-CM | POA: Diagnosis not present

## 2023-11-25 DIAGNOSIS — K7581 Nonalcoholic steatohepatitis (NASH): Secondary | ICD-10-CM | POA: Diagnosis not present

## 2023-11-25 DIAGNOSIS — M2559 Pain in other specified joint: Secondary | ICD-10-CM | POA: Diagnosis not present

## 2023-11-25 DIAGNOSIS — M25511 Pain in right shoulder: Secondary | ICD-10-CM | POA: Diagnosis not present

## 2023-11-25 DIAGNOSIS — R768 Other specified abnormal immunological findings in serum: Secondary | ICD-10-CM | POA: Diagnosis not present

## 2023-11-25 DIAGNOSIS — M3390 Dermatopolymyositis, unspecified, organ involvement unspecified: Secondary | ICD-10-CM | POA: Diagnosis not present

## 2023-11-25 DIAGNOSIS — M79642 Pain in left hand: Secondary | ICD-10-CM | POA: Diagnosis not present

## 2023-11-25 DIAGNOSIS — Z114 Encounter for screening for human immunodeficiency virus [HIV]: Secondary | ICD-10-CM | POA: Diagnosis not present

## 2023-11-25 DIAGNOSIS — E669 Obesity, unspecified: Secondary | ICD-10-CM | POA: Diagnosis not present

## 2023-11-25 DIAGNOSIS — Z6835 Body mass index (BMI) 35.0-35.9, adult: Secondary | ICD-10-CM | POA: Diagnosis not present

## 2023-11-25 DIAGNOSIS — M79641 Pain in right hand: Secondary | ICD-10-CM | POA: Diagnosis not present

## 2023-11-26 ENCOUNTER — Telehealth: Payer: Self-pay | Admitting: Pulmonary Disease

## 2023-11-26 DIAGNOSIS — I5032 Chronic diastolic (congestive) heart failure: Secondary | ICD-10-CM

## 2023-11-26 DIAGNOSIS — J9611 Chronic respiratory failure with hypoxia: Secondary | ICD-10-CM

## 2023-11-26 DIAGNOSIS — J984 Other disorders of lung: Secondary | ICD-10-CM

## 2023-11-26 DIAGNOSIS — G4733 Obstructive sleep apnea (adult) (pediatric): Secondary | ICD-10-CM

## 2023-11-26 NOTE — Telephone Encounter (Signed)
 Created in error

## 2023-11-27 ENCOUNTER — Other Ambulatory Visit: Payer: Self-pay

## 2023-11-27 ENCOUNTER — Ambulatory Visit (HOSPITAL_COMMUNITY)
Admission: RE | Admit: 2023-11-27 | Discharge: 2023-11-27 | Disposition: A | Discharge status: Home / Self Care | Source: Ambulatory Visit | Attending: Internal Medicine | Admitting: Internal Medicine

## 2023-11-27 ENCOUNTER — Encounter (HOSPITAL_COMMUNITY)
Admission: RE | Disposition: A | Discharge status: Home / Self Care | Payer: Self-pay | Source: Ambulatory Visit | Attending: Internal Medicine

## 2023-11-27 DIAGNOSIS — I110 Hypertensive heart disease with heart failure: Secondary | ICD-10-CM | POA: Diagnosis not present

## 2023-11-27 DIAGNOSIS — I5042 Chronic combined systolic (congestive) and diastolic (congestive) heart failure: Secondary | ICD-10-CM | POA: Diagnosis not present

## 2023-11-27 DIAGNOSIS — Z79899 Other long term (current) drug therapy: Secondary | ICD-10-CM | POA: Insufficient documentation

## 2023-11-27 DIAGNOSIS — I4819 Other persistent atrial fibrillation: Secondary | ICD-10-CM | POA: Insufficient documentation

## 2023-11-27 DIAGNOSIS — J984 Other disorders of lung: Secondary | ICD-10-CM

## 2023-11-27 DIAGNOSIS — I5032 Chronic diastolic (congestive) heart failure: Secondary | ICD-10-CM

## 2023-11-27 DIAGNOSIS — E669 Obesity, unspecified: Secondary | ICD-10-CM | POA: Insufficient documentation

## 2023-11-27 DIAGNOSIS — Z7901 Long term (current) use of anticoagulants: Secondary | ICD-10-CM | POA: Diagnosis not present

## 2023-11-27 DIAGNOSIS — Z6834 Body mass index (BMI) 34.0-34.9, adult: Secondary | ICD-10-CM | POA: Insufficient documentation

## 2023-11-27 DIAGNOSIS — E7800 Pure hypercholesterolemia, unspecified: Secondary | ICD-10-CM | POA: Diagnosis not present

## 2023-11-27 DIAGNOSIS — E785 Hyperlipidemia, unspecified: Secondary | ICD-10-CM | POA: Insufficient documentation

## 2023-11-27 DIAGNOSIS — J849 Interstitial pulmonary disease, unspecified: Secondary | ICD-10-CM | POA: Diagnosis not present

## 2023-11-27 DIAGNOSIS — I700 Atherosclerosis of aorta: Secondary | ICD-10-CM | POA: Insufficient documentation

## 2023-11-27 DIAGNOSIS — R0902 Hypoxemia: Secondary | ICD-10-CM | POA: Insufficient documentation

## 2023-11-27 DIAGNOSIS — M3390 Dermatopolymyositis, unspecified, organ involvement unspecified: Secondary | ICD-10-CM | POA: Diagnosis not present

## 2023-11-27 DIAGNOSIS — E119 Type 2 diabetes mellitus without complications: Secondary | ICD-10-CM | POA: Insufficient documentation

## 2023-11-27 DIAGNOSIS — G4733 Obstructive sleep apnea (adult) (pediatric): Secondary | ICD-10-CM | POA: Insufficient documentation

## 2023-11-27 HISTORY — PX: RIGHT HEART CATH: CATH118263

## 2023-11-27 LAB — POCT I-STAT EG7
Acid-base deficit: 2 mmol/L (ref 0.0–2.0)
Acid-base deficit: 3 mmol/L — ABNORMAL HIGH (ref 0.0–2.0)
Acid-base deficit: 3 mmol/L — ABNORMAL HIGH (ref 0.0–2.0)
Bicarbonate: 22.1 mmol/L (ref 20.0–28.0)
Bicarbonate: 23 mmol/L (ref 20.0–28.0)
Bicarbonate: 21.7 mmol/L (ref 20.0–28.0)
Calcium, Ion: 1.17 mmol/L (ref 1.15–1.40)
Calcium, Ion: 1.2 mmol/L (ref 1.15–1.40)
Calcium, Ion: 1.12 mmol/L — ABNORMAL LOW (ref 1.15–1.40)
HCT: 34 % — ABNORMAL LOW (ref 36.0–46.0)
HCT: 36 % (ref 36.0–46.0)
HCT: 33 % — ABNORMAL LOW (ref 36.0–46.0)
Hemoglobin: 11.6 g/dL — ABNORMAL LOW (ref 12.0–15.0)
Hemoglobin: 12.2 g/dL (ref 12.0–15.0)
Hemoglobin: 11.2 g/dL — ABNORMAL LOW (ref 12.0–15.0)
O2 Saturation: 78 %
O2 Saturation: 78 %
O2 Saturation: 79 %
Potassium: 3.6 mmol/L (ref 3.5–5.1)
Potassium: 3.6 mmol/L (ref 3.5–5.1)
Potassium: 3.4 mmol/L — ABNORMAL LOW (ref 3.5–5.1)
Sodium: 135 mmol/L (ref 135–145)
Sodium: 135 mmol/L (ref 135–145)
Sodium: 137 mmol/L (ref 135–145)
TCO2: 23 mmol/L (ref 22–32)
TCO2: 24 mmol/L (ref 22–32)
TCO2: 23 mmol/L (ref 22–32)
pCO2, Ven: 36.5 mmHg — ABNORMAL LOW (ref 44–60)
pCO2, Ven: 38.5 mmHg — ABNORMAL LOW (ref 44–60)
pCO2, Ven: 36.7 mmHg — ABNORMAL LOW (ref 44–60)
pH, Ven: 7.384 (ref 7.25–7.43)
pH, Ven: 7.389 (ref 7.25–7.43)
pH, Ven: 7.38 (ref 7.25–7.43)
pO2, Ven: 43 mmHg (ref 32–45)
pO2, Ven: 43 mmHg (ref 32–45)
pO2, Ven: 43 mmHg (ref 32–45)

## 2023-11-27 LAB — BASIC METABOLIC PANEL WITH GFR
Anion gap: 10 (ref 5–15)
BUN: 17 mg/dL (ref 8–23)
CO2: 24 mmol/L (ref 22–32)
Calcium: 9.2 mg/dL (ref 8.9–10.3)
Chloride: 102 mmol/L (ref 98–111)
Creatinine, Ser: 0.68 mg/dL (ref 0.44–1.00)
GFR, Estimated: >60 mL/min (ref >60)
Glucose, Bld: 107 mg/dL — ABNORMAL HIGH (ref 70–99)
Potassium: 4.1 mmol/L (ref 3.5–5.1)
Sodium: 136 mmol/L (ref 135–145)

## 2023-11-27 LAB — GLUCOSE, CAPILLARY: Glucose-Capillary: 106 mg/dL — ABNORMAL HIGH (ref 70–99)

## 2023-11-27 SURGERY — RIGHT HEART CATH
Anesthesia: LOCAL

## 2023-11-27 MED ORDER — LABETALOL HCL 5 MG/ML IV SOLN: 10.0000 mg | INTRAVENOUS | Status: DC | PRN

## 2023-11-27 MED ORDER — ACETAMINOPHEN 325 MG PO TABS: 650.0000 mg | ORAL_TABLET | ORAL | Status: DC | PRN

## 2023-11-27 MED ORDER — SODIUM CHLORIDE 0.9 % IV SOLN: INTRAVENOUS | Status: DC

## 2023-11-27 MED ORDER — HYDRALAZINE HCL 20 MG/ML IJ SOLN: 10.0000 mg | INTRAMUSCULAR | Status: DC | PRN

## 2023-11-27 MED ORDER — LIDOCAINE HCL (PF) 1 % IJ SOLN
INTRAMUSCULAR | Status: AC
Start: 2023-11-27 — End: ?
  Filled 2023-11-27: qty 30

## 2023-11-27 MED ORDER — SODIUM CHLORIDE 0.9% FLUSH: 3.0000 mL | INTRAVENOUS | Status: DC | PRN

## 2023-11-27 MED ORDER — LIDOCAINE HCL (PF) 1 % IJ SOLN
INTRAMUSCULAR | Status: DC | PRN
  Administered 2023-11-27: 5 mL

## 2023-11-27 MED ORDER — IOHEXOL 350 MG/ML SOLN
INTRAVENOUS | Status: DC | PRN
  Administered 2023-11-27: 10 mL

## 2023-11-27 MED ORDER — ONDANSETRON HCL 4 MG/2ML IJ SOLN: 4.0000 mg | Freq: Four times a day (QID) | INTRAMUSCULAR | Status: DC | PRN

## 2023-11-27 MED ORDER — SODIUM CHLORIDE 0.9 % IV SOLN
250.0000 mL | INTRAVENOUS | Status: DC | PRN
Start: 2023-11-27 — End: 2023-11-27

## 2023-11-27 MED ORDER — SODIUM CHLORIDE 0.9% FLUSH: 3.0000 mL | Freq: Two times a day (BID) | INTRAVENOUS | Status: DC

## 2023-11-27 SURGICAL SUPPLY — 6 items
CATH SWAN GANZ 7F STRAIGHT (CATHETERS) IMPLANT
GLIDESHEATH SLENDER 7FR .021G (SHEATH) IMPLANT
GUIDEWIRE .025 260CM (WIRE) IMPLANT
PACK CARDIAC CATHETERIZATION (CUSTOM PROCEDURE TRAY) ×2 IMPLANT
TRANSDUCER W/STOPCOCK (MISCELLANEOUS) IMPLANT
TUBING ART PRESS 72 MALE/FEM (TUBING) IMPLANT

## 2023-11-27 NOTE — Discharge Instructions (Signed)

## 2023-11-27 NOTE — Interval H&P Note (Signed)
 History and Physical Interval Note:  11/27/2023 7:53 AM  Amber Bennett  has presented today for surgery, with the diagnosis of HEART FAILURE.  The various methods of treatment have been discussed with the patient and family. After consideration of risks, benefits and other options for treatment, the patient has consented to  Procedure(s): RIGHT HEART CATH (N/A) with exercise as a surgical intervention.  The patient's history has been reviewed, patient examined, no change in status, stable for surgery.  I have reviewed the patient's chart and labs.  Questions were answered to the patient's satisfaction.     [name]

## 2023-11-28 ENCOUNTER — Ambulatory Visit

## 2023-11-28 ENCOUNTER — Telehealth: Payer: Self-pay | Admitting: Pulmonary Disease

## 2023-11-28 ENCOUNTER — Other Ambulatory Visit (HOSPITAL_COMMUNITY): Payer: Self-pay

## 2023-11-28 ENCOUNTER — Telehealth (HOSPITAL_COMMUNITY): Payer: Self-pay

## 2023-11-28 DIAGNOSIS — I5032 Chronic diastolic (congestive) heart failure: Secondary | ICD-10-CM

## 2023-11-28 MED ORDER — ENTRESTO 49-51 MG PO TABS: 1.0000 | ORAL_TABLET | Freq: Two times a day (BID) | ORAL | 1 refills | Status: DC

## 2023-11-28 NOTE — Telephone Encounter (Signed)
 Patient called here to the HF clinic inquiring about medication that was discussed at [facility] yesterday. Patient states that she hasn't heard anything yet about medication changes and wanted to check before the weekend.   Spoke with Dr. Bensimhon who recommends that patient stop telmisartan and start Entresto  49/51mg  twice daily- spoke and educated patient on this medication change- she is aware and medication sent to her preferred pharmacy.   Also checked with pharmacy- copay for Entresto  0$  Scheduled patient for repeat blood work (BMP) in one week here at the HF clinic- patient aware of appointment time and date.   Patient very grateful for return call.   Advised patient to call back to office with any issues, questions, or concerns. Patient verbalized understanding.

## 2023-11-28 NOTE — Telephone Encounter (Signed)
 Cmn received from [employer] for oxygen therapy.

## 2023-12-02 ENCOUNTER — Encounter (HOSPITAL_COMMUNITY): Payer: Self-pay | Admitting: Internal Medicine

## 2023-12-02 ENCOUNTER — Ambulatory Visit: Payer: Self-pay | Admitting: Cardiovascular Disease

## 2023-12-03 DIAGNOSIS — J9611 Chronic respiratory failure with hypoxia: Secondary | ICD-10-CM | POA: Diagnosis not present

## 2023-12-03 NOTE — Telephone Encounter (Signed)
 Pt called in today and stated that she is in need of her ONO results, her mask is giving her an allergic reaction, and she feels sick after nights of not cleaning her CPAP, Wanted to know if she is doing something wrong or she should clean it every night instead of every other night.. Pt may need mask fitting so please advise   I will reach out to [employer] to advise on ONO results   Thanks

## 2023-12-03 NOTE — Telephone Encounter (Signed)
 Cleaning instructions for her CPAP  Daily CPAP Cleaning: Wipe down parts of the mask: cushion/pillows, frame/shell and swivel with water and unscented soap. Rinse thoroughly. Place on a clean towel or hang to dry   On a weekly basis, your tubing, mask, headgear and humidifier will need a bath. Swirl in a mild soap solution for about five minutes. Rinse thoroughly. Hang or lay on a clean towel to dry. The device itself should be unplugged and wiped down at this time as well. Allow all parts to air dry before reconnecting.  Every two weeks, the CPAP humidifier should be disinfected in addition to being cleaning in a mild soap solution. To disinfect:  Soak humidifier for 30 minutes in a solution that is one part vinegar to five parts water. Rinse thoroughly. Allow humidifier to dry completely before reassembly.  -----------------------------------------------------------------------------------------------  We are still waiting on the ONO results.

## 2023-12-03 NOTE — Telephone Encounter (Signed)
 Pt notified on VM and cleaning instructions sent on My chart

## 2023-12-04 DIAGNOSIS — K7581 Nonalcoholic steatohepatitis (NASH): Secondary | ICD-10-CM | POA: Diagnosis not present

## 2023-12-04 NOTE — Telephone Encounter (Signed)
 [employer] states ONO results are being faxed over to you all today, they will be giving the pt a call to ensure she's aware of cleaning instructions   NFN

## 2023-12-05 ENCOUNTER — Other Ambulatory Visit (HOSPITAL_COMMUNITY)

## 2023-12-07 ENCOUNTER — Other Ambulatory Visit: Payer: Self-pay | Admitting: Internal Medicine

## 2023-12-08 ENCOUNTER — Encounter: Payer: Self-pay | Admitting: Pulmonary Disease

## 2023-12-08 NOTE — Telephone Encounter (Signed)
**Note De-identified  [place] Obfuscation** Please advise 

## 2023-12-08 NOTE — Telephone Encounter (Signed)
 Pt wants consult for [facility]

## 2023-12-09 ENCOUNTER — Ambulatory Visit (HOSPITAL_COMMUNITY)
Admission: RE | Admit: 2023-12-09 | Discharge: 2023-12-09 | Disposition: A | Discharge status: Home / Self Care | Source: Ambulatory Visit | Attending: Cardiology | Admitting: Cardiology

## 2023-12-09 ENCOUNTER — Ambulatory Visit
Admission: RE | Admit: 2023-12-09 | Discharge: 2023-12-09 | Disposition: A | Discharge status: Home / Self Care | Source: Ambulatory Visit | Attending: Internal Medicine | Admitting: Internal Medicine

## 2023-12-09 ENCOUNTER — Telehealth: Payer: Self-pay

## 2023-12-09 DIAGNOSIS — Z1231 Encounter for screening mammogram for malignant neoplasm of breast: Secondary | ICD-10-CM

## 2023-12-09 DIAGNOSIS — I5032 Chronic diastolic (congestive) heart failure: Secondary | ICD-10-CM | POA: Diagnosis not present

## 2023-12-09 LAB — BASIC METABOLIC PANEL WITH GFR
Anion gap: 9 (ref 5–15)
BUN: 22 mg/dL (ref 8–23)
CO2: 26 mmol/L (ref 22–32)
Calcium: 9.2 mg/dL (ref 8.9–10.3)
Chloride: 97 mmol/L — ABNORMAL LOW (ref 98–111)
Creatinine, Ser: 0.67 mg/dL (ref 0.44–1.00)
GFR, Estimated: >60 mL/min (ref >60)
Glucose, Bld: 82 mg/dL (ref 70–99)
Potassium: 3.5 mmol/L (ref 3.5–5.1)
Sodium: 132 mmol/L — ABNORMAL LOW (ref 135–145)

## 2023-12-09 NOTE — Telephone Encounter (Signed)
 Order placed for referral to pulmonary rehab

## 2023-12-09 NOTE — Telephone Encounter (Signed)
 Left voicemail to return call to office

## 2023-12-09 NOTE — Telephone Encounter (Signed)
 I am covering for Dr C this week- it looks like recommendation from her [facility] was referral to pulmonary rehab for pulmonary hypertension.  Amber Bennett, can we place referral?

## 2023-12-10 ENCOUNTER — Telehealth (HOSPITAL_COMMUNITY): Payer: Self-pay

## 2023-12-10 NOTE — Telephone Encounter (Signed)
Attempted to call patient in regards to Pulmonary Rehab - LM on VM   Sent letter 

## 2023-12-10 NOTE — Telephone Encounter (Signed)
 Pt insurance is active and benefits verified through Medicare a/b Co-pay 0, DED $257/$257 met, out of pocket 0/0 met, co-insurance 20%. no pre-authorization required   2ndary insurance is active and benefits verified through [employer]. Co-pay 0, DED 0/0 met, out of pocket 0/0 met, co-insurance 0%. No pre-authorization required.

## 2023-12-10 NOTE — Telephone Encounter (Signed)
 Pt returned phone call and stated that she is interested in the pulmonary rehab program. I advised pt that once she has been reviewed then we will give her a call back to schedule.

## 2023-12-11 ENCOUNTER — Ambulatory Visit: Payer: Medicare Other | Admitting: Adult Health

## 2023-12-14 ENCOUNTER — Other Ambulatory Visit: Payer: Self-pay | Admitting: Adult Health

## 2023-12-16 ENCOUNTER — Telehealth: Payer: Self-pay | Admitting: Pulmonary Disease

## 2023-12-16 DIAGNOSIS — L245 Irritant contact dermatitis due to other chemical products: Secondary | ICD-10-CM | POA: Diagnosis not present

## 2023-12-16 DIAGNOSIS — L738 Other specified follicular disorders: Secondary | ICD-10-CM | POA: Diagnosis not present

## 2023-12-16 DIAGNOSIS — L723 Sebaceous cyst: Secondary | ICD-10-CM | POA: Diagnosis not present

## 2023-12-16 DIAGNOSIS — L853 Xerosis cutis: Secondary | ICD-10-CM | POA: Diagnosis not present

## 2023-12-16 DIAGNOSIS — L821 Other seborrheic keratosis: Secondary | ICD-10-CM | POA: Diagnosis not present

## 2023-12-16 NOTE — Telephone Encounter (Signed)
 CMA rec'd last month and sent to Dr. Diania Fortes for signature. No questions answered as requested. Just signature. Will return to Dr. Diania Fortes to answer questions.

## 2023-12-17 ENCOUNTER — Telehealth (HOSPITAL_COMMUNITY): Payer: Self-pay

## 2023-12-17 NOTE — Telephone Encounter (Signed)
 Attempted to call patient to schedule pulmonary rehab- no answer, left message to call us  back. Sent MyChart message.

## 2023-12-17 NOTE — Telephone Encounter (Signed)
 Patient called back in regards to participating in the Pulmonary Rehab Program. Patient will come in for orientation on 6/20 and will attend the 1:15 exercise class, will start 7/01 as she will be out of town the 24th & 26th.  Patient will be out of town on July 17th, as well as August 19th & 21st- will not schedule for those class dates.  Sent MyChart message.

## 2023-12-18 ENCOUNTER — Other Ambulatory Visit: Payer: Self-pay | Admitting: Adult Health

## 2023-12-19 ENCOUNTER — Telehealth (HOSPITAL_COMMUNITY): Payer: Self-pay

## 2023-12-19 ENCOUNTER — Encounter (HOSPITAL_COMMUNITY)
Admission: RE | Admit: 2023-12-19 | Discharge: 2023-12-19 | Disposition: A | Discharge status: Home / Self Care | Source: Ambulatory Visit | Attending: Cardiology | Admitting: Cardiology

## 2023-12-19 ENCOUNTER — Encounter (HOSPITAL_COMMUNITY): Payer: Self-pay

## 2023-12-19 VITALS — BP 126/62 | HR 83 | Ht <= 58 in | Wt 159.4 lb

## 2023-12-19 DIAGNOSIS — I272 Pulmonary hypertension, unspecified: Secondary | ICD-10-CM | POA: Diagnosis present

## 2023-12-19 DIAGNOSIS — J984 Other disorders of lung: Secondary | ICD-10-CM | POA: Diagnosis not present

## 2023-12-19 NOTE — Progress Notes (Signed)
 Pulmonary Individual Treatment Plan  Patient Details  Name: Amber Bennett MRN: [id number] Date of Birth: 1951/02/23 Referring Provider:   Gattis Kass Pulmonary Rehab Walk Test from 12/19/2023 in [facility]  Referring Provider Dr. Carson Clara    Initial Encounter Date:  Flowsheet Row Pulmonary Rehab Walk Test from 12/19/2023 in [facility]  Date 12/19/23    Visit Diagnosis: Restrictive lung disease  Pulmonary HTN (HCC)  Patient's Home Medications on Admission:   Current Outpatient Medications:    acetaminophen  (TYLENOL ) 500 MG tablet, Take 500 mg by mouth every 6 (six) hours as needed for moderate pain (pain score 4-6) or mild pain (pain score 1-3)., Disp: , Rfl:    Alirocumab  (PRALUENT ) 150 MG/ML SOAJ, Inject 1 mL (150 mg total) into the skin every 14 (fourteen) days., Disp: 6 mL, Rfl: 3   amLODipine  (NORVASC ) 2.5 MG tablet, Take 1 tablet (2.5 mg total) by mouth at bedtime., Disp: 90 tablet, Rfl: 3   apixaban  (ELIQUIS ) 5 MG TABS tablet, Take 1 tablet (5 mg total) by mouth 2 (two) times daily., Disp: 180 tablet, Rfl: 1   Ascorbic Acid (VITAMIN C) 1000 MG tablet, Take 1,000 mg by mouth daily. Airborne emergen c, Disp: , Rfl:    beclomethasone (QVAR  REDIHALER) 80 MCG/ACT inhaler, Inhale 1 puff into the lungs 2 (two) times daily., Disp: 31.8 g, Rfl: 1   Calcium Carb-Cholecalciferol  (CALTRATE 600+D3 PO), Take 1 tablet by mouth daily., Disp: , Rfl:    Cholecalciferol  (VITAMIN D ) 50 MCG (2000 UT) tablet, Take 2,000 Units by mouth daily., Disp: , Rfl:    Coenzyme Q10 (CO Q 10 PO), Take 200 mg by mouth daily., Disp: , Rfl:    colestipol  (COLESTID ) 1 g tablet, TAKE 2 TABLETS(2 GRAMS) BY MOUTH AT BEDTIME, Disp: 180 tablet, Rfl: 0   cyclobenzaprine  (FLEXERIL ) 5 MG tablet, Take 1 tablet (5 mg total) by mouth every 8 (eight) hours as needed., Disp: 30 tablet, Rfl: 1    diphenoxylate -atropine  (LOMOTIL ) 2.5-0.025 MG tablet, Take 1-2 tablets by mouth every 4 hours as needed for diarrhea/loose stools, Disp: 90 tablet, Rfl: 1   DULoxetine  (CYMBALTA ) 30 MG capsule, Take 1 capsule (30 mg total) by mouth every evening., Disp: 90 capsule, Rfl: 4   ezetimibe  (ZETIA ) 10 MG tablet, Take 10 mg by mouth every evening., Disp: , Rfl:    furosemide  (LASIX ) 40 MG tablet, Take 2 tablets (80 mg total) by mouth daily., Disp: 90 tablet, Rfl: 3   gabapentin  (NEURONTIN ) 800 MG tablet, Take 1 tablet (800 mg total) by mouth 2 (two) times daily., Disp: 180 tablet, Rfl: 3   ibandronate (BONIVA) 150 MG tablet, Take 150 mg by mouth every 30 (thirty) days., Disp: , Rfl:    ipratropium (ATROVENT ) 0.03 % nasal spray, Place 2 sprays into both nostrils every 12 (twelve) hours., Disp: 30 mL, Rfl: 4   ipratropium-albuterol  (DUONEB) 0.5-2.5 (3) MG/3ML SOLN, Take 3 mLs by nebulization 3 (three) times daily as needed., Disp: 360 mL, Rfl: 5   loratadine  (CLARITIN ) 10 MG tablet, Take 1 tablet (10 mg total) by mouth daily as needed for allergies (Can take an extra dose during flare ups.)., Disp: 90 tablet, Rfl: 1   metoprolol  tartrate (LOPRESSOR ) 50 MG tablet, Take 1 tablet (50 mg total) by mouth 2 (two) times daily., Disp: , Rfl:    mometasone  (ASMANEX ) 220 MCG/ACT inhaler, Inhale 1 puff into  the lungs 2 (two) times daily., Disp: 3 each, Rfl: 1   Multiple Vitamins-Minerals (MULTIVITAMIN PO), Take 1 tablet by mouth daily., Disp: , Rfl:    Multiple Vitamins-Minerals (PRESERVISION AREDS PO), Take 1 capsule by mouth daily., Disp: , Rfl:    Olopatadine-Mometasone  (RYALTRIS ) 665-25 MCG/ACT SUSP, Place 2 sprays into the nose 2 (two) times daily., Disp: 87 g, Rfl: 1   omeprazole (PRILOSEC) 20 MG capsule, Take 20 mg by mouth 2 (two) times daily before a meal., Disp: , Rfl: 3   PHENobarbital  (LUMINAL) 97.2 MG tablet, TAKE 1 AND 1/2 TABLETS BY MOUTH EVERY DAY, Disp: 135 tablet, Rfl: 2   potassium chloride  SA  (KLOR-CON  M) 20 MEQ tablet, While taking 40 mg of Furosemide , take Potassium Chloride  20 meq twice a day. When you decrease Furosemide  to 20 mg, decrease Potassium Chloride  to 20 meq once a day., Disp: 180 tablet, Rfl: 3   PRALUENT  150 MG/ML SOAJ, Inject 150 mg into the skin every 14 (fourteen) days., Disp: , Rfl:    pyridoxine  (B-6) 100 MG tablet, Take 100 mg by mouth daily., Disp: , Rfl:    sacubitril-valsartan (ENTRESTO ) 49-51 MG, Take 1 tablet by mouth 2 (two) times daily., Disp: 60 tablet, Rfl: 1   topiramate  (TOPAMAX ) 50 MG tablet, TAKE 1 TABLET BY MOUTH IN THE MORNING AND 2 TABLETS BY MOUTH EVERY EVENING, Disp: 90 tablet, Rfl: 0   vitamin E 400 UNIT capsule, Take 400 Units by mouth daily., Disp: , Rfl:    ZEPBOUND  2.5 MG/0.5ML Pen, Inject 2.5 mg into the skin once a week. 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   Carpal tunnel syndrome    Cervical radiculitis    Chronic diarrhea 10/24/2023   Collar bone fracture    Depression    Dermatomyositis (HCC)    Diabetes mellitus without complication (HCC)    Dysrhythmia    Elevated liver enzymes    External hemorrhoids    Fatty liver    Fibromyalgia    Fibromyalgia affecting hand 08/2022   bilateral wrist and hands   Hematuria    negative evaluation   High cholesterol    HTN (hypertension)    Internal hemorrhoids    Laryngitis    Lower back pain    Macular degeneration    Nonalcoholic steatohepatitis (NASH)    Nonalcoholic steatohepatitis (NASH)    Osteopenia    Left side hip   Seizures (HCC)    09/07/2019 pt reports last seizure in 2014   Tubular adenoma of colon     Tobacco Use: Social History   Tobacco Use  Smoking Status Former   Current packs/day: 1.50   Average  packs/day: 1.5 packs/day for 45.5 years (68.2 ttl pk-yrs)   Types: Cigarettes   Start date: 07/01/1978  Smokeless Tobacco Never  Tobacco Comments   Former smoker 04/10/23    Labs: Review Flowsheet       Latest Ref Rng & Units 02/05/2022 11/27/2023  Labs for ITP Cardiac and Pulmonary Rehab  Hemoglobin A1c 4.8 - 5.6 % 4.9  -  Bicarbonate 20.0 - 28.0 mmol/L - 21.7  23.0  22.1   TCO2 22 - 32 mmol/L - 23  24  23   Acid-base deficit 0.0 - 2.0 mmol/L - 3.0  2.0  3.0   O2 Saturation % - 79  78  78     Details       Multiple values from one day are sorted in reverse-chronological order         Capillary Blood Glucose: Lab Results  Component Value Date   GLUCAP 106 (H) 11/27/2023   GLUCAP 99 09/19/2023   GLUCAP 122 (H) 09/19/2023   GLUCAP 153 (H) 03/13/2023   GLUCAP 111 (H) 03/13/2023     Pulmonary Assessment Scores:  Pulmonary Assessment Scores     Row Name 12/19/23 1416         ADL UCSD   ADL Phase Entry     SOB Score total 28       CAT Score   CAT Score 18       mMRC Score   mMRC Score 0       UCSD: Self-administered rating of dyspnea associated with activities of daily living (ADLs) 6-point scale (0 = not at all to 5 = maximal or unable to do because of breathlessness)  Scoring Scores range from 0 to 120.  Minimally important difference is 5 units  CAT: CAT can identify the health impairment of COPD patients and is better correlated with disease progression.  CAT has a scoring range of zero to 40. The CAT score is classified into four groups of low (less than 10), medium (10 - 20), high (21-30) and very high (31-40) based on the impact level of disease on health status. A CAT score over 10 suggests significant symptoms.  A worsening CAT score could be explained by an exacerbation, poor medication adherence, poor inhaler technique, or progression of COPD or comorbid conditions.  CAT MCID is 2 points  mMRC: mMRC (Modified Medical Research Council) Dyspnea  Scale is used to assess the degree of baseline functional disability in patients of respiratory disease due to dyspnea. No minimal important difference is established. A decrease in score of 1 point or greater is considered a positive change.   Pulmonary Function Assessment:  Pulmonary Function Assessment - 12/19/23 1415       Breath   Shortness of Breath Yes;Limiting activity          Exercise Target Goals: Exercise Program Goal: Individual exercise prescription set using results from initial 6 min walk test and THRR while considering  patient's activity barriers and safety.   Exercise Prescription Goal: Initial exercise prescription builds to 30-45 minutes a day of aerobic activity, 2-3 days per week.  Home exercise guidelines will be given to patient during program as part of exercise prescription that the participant will acknowledge.  Activity Barriers & Risk Stratification:  Activity Barriers & Cardiac Risk Stratification - 12/19/23 1416       Activity Barriers & Cardiac Risk Stratification   Activity Barriers Back Problems;Neck/Spine Problems;Right Hip Replacement;Balance Concerns;Arthritis;Joint Problems;History of Falls          6 Minute Walk:  6 Minute Walk     Row Name 12/19/23 1522         6 Minute Walk   Phase Initial     Distance 872 feet     Walk Time 6 minutes     # of Rest Breaks 0     MPH 1.65     METS 1.59     RPE 11     Perceived Dyspnea  1     VO2 Peak  5.55     Symptoms No     Resting HR 83 bpm     Resting BP 126/62     Resting Oxygen Saturation  98 %     Exercise Oxygen Saturation  during 6 min walk 96 %     Max Ex. HR 100 bpm     Max Ex. BP 146/62     2 Minute Post BP 130/60       Interval HR   1 Minute HR 90     2 Minute HR 94     3 Minute HR 98     4 Minute HR 100     5 Minute HR 98     6 Minute HR 98     2 Minute Post HR 84     Interval Heart Rate? Yes       Interval Oxygen   Interval Oxygen? Yes     Baseline Oxygen  Saturation % 98 %     1 Minute Oxygen Saturation % 98 %     1 Minute Liters of Oxygen 0 L     2 Minute Oxygen Saturation % 96 %     2 Minute Liters of Oxygen 0 L     3 Minute Oxygen Saturation % 97 %     3 Minute Liters of Oxygen 0 L     4 Minute Oxygen Saturation % 98 %     4 Minute Liters of Oxygen 0 L     5 Minute Oxygen Saturation % 98 %     5 Minute Liters of Oxygen 0 L     6 Minute Oxygen Saturation % 97 %     6 Minute Liters of Oxygen 0 L     2 Minute Post Oxygen Saturation % 99 %     2 Minute Post Liters of Oxygen 0 L        Oxygen Initial Assessment:  Oxygen Initial Assessment - 12/19/23 1414       Home Oxygen   Home Oxygen Device None    Sleep Oxygen Prescription CPAP    Liters per minute 3    Home Exercise Oxygen Prescription None    Home Resting Oxygen Prescription None    Compliance with Home Oxygen Use Yes      Initial 6 min Walk   Oxygen Used None      Program Oxygen Prescription   Program Oxygen Prescription None      Intervention   Short Term Goals To learn and exhibit compliance with exercise, home and travel O2 prescription;To learn and understand importance of maintaining oxygen saturations>88%;To learn and demonstrate proper use of respiratory medications;To learn and understand importance of monitoring SPO2 with pulse oximeter and demonstrate accurate use of the pulse oximeter.;To learn and demonstrate proper pursed lip breathing techniques or other breathing techniques.     Long  Term Goals Verbalizes importance of monitoring SPO2 with pulse oximeter and return demonstration;Exhibits compliance with exercise, home  and travel O2 prescription;Maintenance of O2 saturations>88%;Exhibits proper breathing techniques, such as pursed lip breathing or other method taught during program session;Compliance with respiratory medication;Demonstrates proper use of MDI's          Oxygen Re-Evaluation:   Oxygen Discharge (Final Oxygen Re-Evaluation):   Initial  Exercise Prescription:  Initial Exercise Prescription - 12/19/23 1500       Date of Initial Exercise RX and Referring Provider   Date 12/19/23    Referring Provider Dr. Carson Clara  Expected Discharge Date 03/18/24      NuStep   Level 2    SPM 60    Minutes 15    METs 1.1      Recumbant Elliptical   Level 1    RPM 26    Watts 12    Minutes 15    METs 1.2      Prescription Details   Frequency (times per week) 2    Duration Progress to 30 minutes of continuous aerobic without signs/symptoms of physical distress      Intensity   THRR 40-80% of Max Heartrate 58-116    Ratings of Perceived Exertion 11-13    Perceived Dyspnea 0-4      Progression   Progression Continue to progress workloads to maintain intensity without signs/symptoms of physical distress.      Resistance Training   Training Prescription Yes    Weight red bands    Reps 10-15          Perform Capillary Blood Glucose checks as needed.  Exercise Prescription Changes:   Exercise Comments:   Exercise Goals and Review:   Exercise Goals     Row Name 12/19/23 1417             Exercise Goals   Increase Physical Activity Yes       Intervention Provide advice, education, support and counseling about physical activity/exercise needs.;Develop an individualized exercise prescription for aerobic and resistive training based on initial evaluation findings, risk stratification, comorbidities and participant's personal goals.       Expected Outcomes Short Term: Attend rehab on a regular basis to increase amount of physical activity.;Long Term: Add in home exercise to make exercise part of routine and to increase amount of physical activity.;Long Term: Exercising regularly at least 3-5 days a week.       Increase Strength and Stamina Yes       Intervention Provide advice, education, support and counseling about physical activity/exercise needs.;Develop an individualized exercise prescription for  aerobic and resistive training based on initial evaluation findings, risk stratification, comorbidities and participant's personal goals.       Expected Outcomes Short Term: Increase workloads from initial exercise prescription for resistance, speed, and METs.;Short Term: Perform resistance training exercises routinely during rehab and add in resistance training at home;Long Term: Improve cardiorespiratory fitness, muscular endurance and strength as measured by increased METs and functional capacity ( )       Able to understand and use rate of perceived exertion (RPE) scale Yes       Intervention Provide education and explanation on how to use RPE scale       Expected Outcomes Short Term: Able to use RPE daily in rehab to express subjective intensity level;Long Term:  Able to use RPE to guide intensity level when exercising independently       Able to understand and use Dyspnea scale Yes       Intervention Provide education and explanation on how to use Dyspnea scale       Expected Outcomes Short Term: Able to use Dyspnea scale daily in rehab to express subjective sense of shortness of breath during exertion;Long Term: Able to use Dyspnea scale to guide intensity level when exercising independently       Knowledge and understanding of Target Heart Rate Range (THRR) Yes       Intervention Provide education and explanation of THRR including how the numbers were predicted and where they are located for reference  Expected Outcomes Short Term: Able to state/look up THRR;Long Term: Able to use THRR to govern intensity when exercising independently;Short Term: Able to use daily as guideline for intensity in rehab       Understanding of Exercise Prescription Yes       Intervention Provide education, explanation, and written materials on patient's individual exercise prescription       Expected Outcomes Short Term: Able to explain program exercise prescription;Long Term: Able to explain home exercise  prescription to exercise independently          Exercise Goals Re-Evaluation :   Discharge Exercise Prescription (Final Exercise Prescription Changes):   Nutrition:  Target Goals: Understanding of nutrition guidelines, daily intake of sodium 1500mg , cholesterol 200mg , calories 30% from fat and 7% or less from saturated fats, daily to have 5 or more servings of fruits and vegetables.  Biometrics:    Nutrition Therapy Plan and Nutrition Goals:   Nutrition Assessments:  MEDIFICTS Score Key: >=70 Need to make dietary changes  40-70 Heart Healthy Diet <= 40 Therapeutic Level Cholesterol Diet   Picture Your Plate Scores: <29 Unhealthy dietary pattern with much room for improvement. 41-50 Dietary pattern unlikely to meet recommendations for good health and room for improvement. 51-60 More healthful dietary pattern, with some room for improvement.  >60 Healthy dietary pattern, although there may be some specific behaviors that could be improved.    Nutrition Goals Re-Evaluation:   Nutrition Goals Discharge (Final Nutrition Goals Re-Evaluation):   Psychosocial: Target Goals: Acknowledge presence or absence of significant depression and/or stress, maximize coping skills, provide positive support system. Participant is able to verbalize types and ability to use techniques and skills needed for reducing stress and depression.  Initial Review & Psychosocial Screening:  Initial Psych Review & Screening - 12/19/23 1410       Initial Review   Current issues with None Identified      Family Dynamics   Good Support System? Yes    Comments Val's son and spouse      Barriers   Psychosocial barriers to participate in program There are no identifiable barriers or psychosocial needs.      Screening Interventions   Interventions Encouraged to exercise          Quality of Life Scores:  Scores of 19 and below usually indicate a poorer quality of life in these areas.  A  difference of  2-3 points is a clinically meaningful difference.  A difference of 2-3 points in the total score of the Quality of Life Index has been associated with significant improvement in overall quality of life, self-image, physical symptoms, and general health in studies assessing change in quality of life.  PHQ-9: Review Flowsheet       12/19/2023 06/18/2016  Depression screen PHQ 2/9  Decreased Interest 0 0  Down, Depressed, Hopeless 0 0  PHQ - 2 Score 0 0  Altered sleeping 0 0  Tired, decreased energy 1 0  Change in appetite 0 0  Feeling bad or failure about yourself  0 0  Trouble concentrating 0 0  Moving slowly or fidgety/restless 0 0  Suicidal thoughts 0 0   PHQ-9 Score 1 0  Difficult doing work/chores Somewhat difficult -    Details       Data saved with a previous flowsheet row definition        Interpretation of Total Score  Total Score Depression Severity:  1-4 = Minimal depression, 5-9 = Mild depression, 10-14 =  Moderate depression, 15-19 = Moderately severe depression, 20-27 = Severe depression   Psychosocial Evaluation and Intervention:  Psychosocial Evaluation - 12/19/23 1411       Psychosocial Evaluation & Interventions   Interventions Encouraged to exercise with the program and follow exercise prescription    Comments Yurani denies any psychosocial barriers at this time.    Expected Outcomes For Honora to particpate in [place] without any psychosocial barriers    Continue Psychosocial Services  No Follow up required          Psychosocial Re-Evaluation:   Psychosocial Discharge (Final Psychosocial Re-Evaluation):   Education: Education Goals: Education classes will be provided on a weekly basis, covering required topics. Participant will state understanding/return demonstration of topics presented.  Learning Barriers/Preferences:  Learning Barriers/Preferences - 12/19/23 1411       Learning Barriers/Preferences   Learning Barriers Sight     Learning Preferences Pictoral;Written Material;Video;Skilled Demonstration          Education Topics: Know Your Numbers Group instruction that is supported by a PowerPoint presentation. Instructor discusses importance of knowing and understanding resting, exercise, and post-exercise oxygen saturation, heart rate, and blood pressure. Oxygen saturation, heart rate, blood pressure, rating of perceived exertion, and dyspnea are reviewed along with a normal range for these values.    Exercise for the Pulmonary Patient Group instruction that is supported by a PowerPoint presentation. Instructor discusses benefits of exercise, core components of exercise, frequency, duration, and intensity of an exercise routine, importance of utilizing pulse oximetry during exercise, safety while exercising, and options of places to exercise outside of rehab.    MET Level  Group instruction provided by PowerPoint, verbal discussion, and written material to support subject matter. Instructor reviews what METs are and how to increase METs.    Pulmonary Medications Verbally interactive group education provided by instructor with focus on inhaled medications and proper administration.   Anatomy and Physiology of the Respiratory System Group instruction provided by PowerPoint, verbal discussion, and written material to support subject matter. Instructor reviews respiratory cycle and anatomical components of the respiratory system and their functions. Instructor also reviews differences in obstructive and restrictive respiratory diseases with examples of each.    Oxygen Safety Group instruction provided by PowerPoint, verbal discussion, and written material to support subject matter. There is an overview of "What is Oxygen" and "Why do we need it".  Instructor also reviews how to create a safe environment for oxygen use, the importance of using oxygen as prescribed, and the risks of noncompliance. There is a brief  discussion on traveling with oxygen and resources the patient may utilize.   Oxygen Use Group instruction provided by PowerPoint, verbal discussion, and written material to discuss how supplemental oxygen is prescribed and different types of oxygen supply systems. Resources for more information are provided.    Breathing Techniques Group instruction that is supported by demonstration and informational handouts. Instructor discusses the benefits of pursed lip and diaphragmatic breathing and detailed demonstration on how to perform both.     Risk Factor Reduction Group instruction that is supported by a PowerPoint presentation. Instructor discusses the definition of a risk factor, different risk factors for pulmonary disease, and how the heart and lungs work together.   Pulmonary Diseases Group instruction provided by PowerPoint, verbal discussion, and written material to support subject matter. Instructor gives an overview of the different type of pulmonary diseases. There is also a discussion on risk factors and symptoms as well as ways to manage the  diseases.   Stress and Energy Conservation Group instruction provided by PowerPoint, verbal discussion, and written material to support subject matter. Instructor gives an overview of stress and the impact it can have on the body. Instructor also reviews ways to reduce stress. There is also a discussion on energy conservation and ways to conserve energy throughout the day.   Warning Signs and Symptoms Group instruction provided by PowerPoint, verbal discussion, and written material to support subject matter. Instructor reviews warning signs and symptoms of stroke, heart attack, cold and flu. Instructor also reviews ways to prevent the spread of infection.   Other Education Group or individual verbal, written, or video instructions that support the educational goals of the pulmonary rehab program.    Knowledge Questionnaire Score:   Knowledge Questionnaire Score - 12/19/23 1424       Knowledge Questionnaire Score   Pre Score 15/18          Core Components/Risk Factors/Patient Goals at Admission:  Personal Goals and Risk Factors at Admission - 12/19/23 1412       Core Components/Risk Factors/Patient Goals on Admission    Weight Management Yes;Weight Loss    Intervention Weight Management: Develop a combined nutrition and exercise program designed to reach desired caloric intake, while maintaining appropriate intake of nutrient and fiber, sodium and fats, and appropriate energy expenditure required for the weight goal.;Weight Management: Provide education and appropriate resources to help participant work on and attain dietary goals.;Weight Management/Obesity: Establish reasonable short term and long term weight goals.;Obesity: Provide education and appropriate resources to help participant work on and attain dietary goals.    Expected Outcomes Understanding of distribution of calorie intake throughout the day with the consumption of 4-5 meals/snacks;Understanding recommendations for meals to include 15-35% energy as protein, 25-35% energy from fat, 35-60% energy from carbohydrates, less than 200mg  of dietary cholesterol, 20-35 gm of total fiber daily;Weight Loss: Understanding of general recommendations for a balanced deficit meal plan, which promotes 1-2 lb weight loss per week and includes a negative energy balance of [id number] kcal/d;Weight Maintenance: Understanding of the daily nutrition guidelines, which includes 25-35% calories from fat, 7% or less cal from saturated fats, less than 200mg  cholesterol, less than 1.5gm of sodium, & 5 or more servings of fruits and vegetables daily;Long Term: Adherence to nutrition and physical activity/exercise program aimed toward attainment of established weight goal;Short Term: Continue to assess and modify interventions until short term weight is achieved    Improve shortness of breath  with ADL's Yes    Intervention Provide education, individualized exercise plan and daily activity instruction to help decrease symptoms of SOB with activities of daily living.    Expected Outcomes Short Term: Improve cardiorespiratory fitness to achieve a reduction of symptoms when performing ADLs;Long Term: Be able to perform more ADLs without symptoms or delay the onset of symptoms          Core Components/Risk Factors/Patient Goals Review:    Core Components/Risk Factors/Patient Goals at Discharge (Final Review):    ITP Comments:   Comments: Dr. Genetta Kenning is Medical Director for Pulmonary Rehab at [facility].

## 2023-12-19 NOTE — Telephone Encounter (Signed)
 Called pt to check on her since she had not arrived to her PR orientation appt. No answer. Left VM.

## 2023-12-19 NOTE — Progress Notes (Signed)
 Pulmonary Rehab Orientation Physical Assessment Note    Well appearing, A&Ox4, NAD Eyes/Ears: wears corrective glasses Lungs: Clear with no wheezes, rales, rhonchi, denies chronic cough, dyspnea on exertion, wearing 3L with Cpap nightly Heart: Regular rate rhythm, no murmurs, no rubs, no clicks Gastrointestinal: abdomin soft, + bowel sounds in all 4 quads, denies recent weight gain or loss, endorses normal BMs Genitourinary: WNL, +frequency d/t lasix  Extremities:  +2 pulses, grip strength equal, strong, no edema, no cyanosis, no clubbing Integumentary: pt denies any rashes, open or non healing wounds Psy/Soc: Pt denies any psy/soc barriers or concerns Assistive devices: Pt has home concentrator and Cpap

## 2023-12-19 NOTE — Progress Notes (Signed)
 Amber Bennett 73 y.o. female Pulmonary Rehab Orientation Note This patient who was referred to Pulmonary Rehab by Dr. Alda Amas with the diagnosis of restrictive lung disease and Pulmonary HTN arrived today in Cardiac and Pulmonary Rehab. She  arrived ambulatory with normal gait. She  does not carry portable oxygen. [employer] is the provider for their DME. Per patient, Cielle uses oxygen at night when going to sleep. Color good, skin warm and dry. Patient is oriented to time and place. Patient's medical history, psychosocial health, and medications reviewed. Psychosocial assessment reveals patient lives with spouse. 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 Patient hobbies include spending time with others and cooking. Patient reports her stress level is low. Areas of stress/anxiety include N/A. Patient does not exhibit signs of depression. PHQ2/9 score 0/1. Seraphina shows good  coping skills with positive outlook on life. Offered emotional support and reassurance. Will continue to monitor. Physical assessment performed by Willard Harman RN. Please see their orientation physical assessment note. Cyla reports she does take medications as prescribed. Patient states she follows a regular  diet. The patient has been trying to lose weight through a healthy diet and exercise program.. Patient's weight will be monitored closely. Demonstration and practice of PLB using pulse oximeter. Isabelly able to return demonstration satisfactorily. Safety and hand hygiene in the exercise area reviewed with patient. Demetrice voices understanding of the information reviewed. Department expectations discussed with patient and achievable goals were set. The patient shows enthusiasm about attending the program and we look forward to working with [facility] [employer]. Najia completed a 6 min walk test today and is scheduled to begin exercise on 12/30/23 at 1:15 pm.  [phone] Floretta Huron, MS, ACSM-CEP

## 2023-12-29 DIAGNOSIS — F444 Conversion disorder with motor symptom or deficit: Secondary | ICD-10-CM | POA: Diagnosis not present

## 2023-12-29 DIAGNOSIS — R490 Dysphonia: Secondary | ICD-10-CM | POA: Diagnosis not present

## 2023-12-29 DIAGNOSIS — J383 Other diseases of vocal cords: Secondary | ICD-10-CM | POA: Diagnosis not present

## 2023-12-29 DIAGNOSIS — I272 Pulmonary hypertension, unspecified: Secondary | ICD-10-CM | POA: Diagnosis not present

## 2023-12-29 DIAGNOSIS — Z87891 Personal history of nicotine dependence: Secondary | ICD-10-CM | POA: Diagnosis not present

## 2023-12-29 DIAGNOSIS — J385 Laryngeal spasm: Secondary | ICD-10-CM | POA: Diagnosis not present

## 2023-12-29 DIAGNOSIS — G4730 Sleep apnea, unspecified: Secondary | ICD-10-CM | POA: Diagnosis not present

## 2023-12-30 ENCOUNTER — Encounter (HOSPITAL_COMMUNITY)
Admission: RE | Admit: 2023-12-30 | Discharge: 2023-12-30 | Disposition: A | Discharge status: Home / Self Care | Source: Ambulatory Visit | Attending: Pulmonary Disease | Admitting: Pulmonary Disease

## 2023-12-30 DIAGNOSIS — I272 Pulmonary hypertension, unspecified: Secondary | ICD-10-CM | POA: Diagnosis not present

## 2023-12-30 DIAGNOSIS — J984 Other disorders of lung: Secondary | ICD-10-CM | POA: Insufficient documentation

## 2023-12-30 NOTE — Progress Notes (Signed)
 Daily Session Note  Patient Details  Name: Amber Bennett MRN: [id number] Date of Birth: [birth year] Referring Provider:   Conrad Ports Pulmonary Rehab Walk Test from 12/19/2023 in [facility] for [facility], V[facility]  Referring Provider Dr. Lonni Nanas    Encounter Date: 12/30/2023  Check In:  Session Check In - 12/30/23 1329       Check-In   Supervising physician immediately available to respond to emergencies [facility] MD immediately available    Physician(s) Jackee Alberts, NP    Location [facility]    Staff Present Johnnie Moats, MS, ACSM-CEP, Exercise Physiologist;Mary Harvy, RN, BSN;Randi Midge BS, ACSM-CEP, Exercise Physiologist;Panfilo Ketchum Claudene, RT    Virtual Visit No    Medication changes reported     No    Fall or balance concerns reported    Yes    Tobacco Cessation No Change    Warm-up and Cool-down Performed as group-led instruction   only   Resistance Training Performed Yes    VAD Patient? No    PAD/SET Patient? No      Pain Assessment   Currently in Pain? No/denies          Capillary Blood Glucose: No results found for this or any previous visit (from the past 24 hours).    Social History   Tobacco Use  Smoking Status Former   Current packs/day: 1.50   Average packs/day: 1.5 packs/day for 45.5 years (68.2 ttl pk-yrs)   Types: Cigarettes   Start date: 07/01/1978  Smokeless Tobacco Never  Tobacco Comments   Former smoker 04/10/23    Goals Met:  Proper associated with RPD/PD & O2 Sat Independence with exercise equipment Exercise tolerated well No report of concerns or symptoms today Strength training completed today  Goals Unmet:  Not Applicable  Comments: Service time is from 1320 to 1436.    Dr. Slater Staff is Medical Director for Pulmonary Rehab at [facility].

## 2023-12-31 ENCOUNTER — Other Ambulatory Visit: Payer: Self-pay | Admitting: Adult Health

## 2023-12-31 MED ORDER — PHENOBARBITAL 97.2 MG PO TABS: 145.8000 mg | ORAL_TABLET | Freq: Every day | ORAL | 2 refills | Status: DC

## 2023-12-31 NOTE — Telephone Encounter (Signed)
 Pt is requesting a refill for PHENobarbital  (LUMINAL) 97.2 MG tablet .  Pharmacy: [facility] [phone]

## 2023-12-31 NOTE — Telephone Encounter (Signed)
  Dispensed Days Supply Quantity Provider Pharmacy   PHENOBARBITAL  97.2MG  TABLETS 10/13/2023 84 135 each Ines Onetha NOVAK, MD [facility] #...  PHENOBARBITAL  97.2MG  TABLETS 07/17/2023 84 127 each Ines Onetha NOVAK, MD [facility]ORE #...  PHENOBARBITAL  97.2MG  TABLETS 07/16/2023 5 8 each Ines Onetha NOVAK, MD [facility] DR[facility] STORE #...  PHENOBARBITAL  97.2MG  TABLETS 07/11/2023 5 8 each Ines Onetha NOVAK, MD [facility]ORE #...  PHENOBARBITAL  97.2MG  TABLETS 04/08/2023 90 135 each Onita Duos, MD [facility] DR[facility] STORE #...  PHENOBARBITAL  97.2MG  TABLETS 01/09/2023 90 135 each Onita Duos, MD [facility] DRUG STORE #...   Last visit 12/11/22 Next visit 01/12/24

## 2024-01-01 ENCOUNTER — Encounter (HOSPITAL_COMMUNITY)
Admission: RE | Admit: 2024-01-01 | Discharge: 2024-01-01 | Disposition: A | Discharge status: Home / Self Care | Source: Ambulatory Visit | Attending: Pulmonary Disease | Admitting: Pulmonary Disease

## 2024-01-01 DIAGNOSIS — I272 Pulmonary hypertension, unspecified: Secondary | ICD-10-CM

## 2024-01-01 DIAGNOSIS — J984 Other disorders of lung: Secondary | ICD-10-CM

## 2024-01-01 NOTE — Progress Notes (Signed)
 Daily Session Note  Patient Details  Name: Amber Bennett MRN: [id number] Date of Birth: 1950-10-02 Referring Provider:   Conrad Ports Pulmonary Rehab Walk Test from 12/19/2023 in [facility] for Heart, Vascular, & Lung Health  Referring Provider Dr. Lonni Nanas    Encounter Date: 01/01/2024  Check In:  Session Check In - 01/01/24 1515       Check-In   Supervising physician immediately available to respond to emergencies [facility] MD immediately available    Physician(s) Lum Louis, NP    Location [facility]    Staff Present Johnnie Moats, MS, ACSM-CEP, Exercise Physiologist;Mary Harvy, RN, BSN;Randi Midge BS, ACSM-CEP, Exercise Physiologist;Johnny Fayette, MS, Exercise Physiologist;Other    Virtual Visit No    Medication changes reported     No    Fall or balance concerns reported    No    Tobacco Cessation No Change    Warm-up and Cool-down Performed as group-led instruction    Resistance Training Performed Yes    VAD Patient? No    PAD/SET Patient? No      Pain Assessment   Currently in Pain? No/denies    Pain Score 0-No pain    Multiple Pain Sites No          Capillary Blood Glucose: No results found for this or any previous visit (from the past 24 hours).    Social History   Tobacco Use  Smoking Status Former   Current packs/day: 1.50   Average packs/day: 1.5 packs/day for 45.5 years (68.3 ttl pk-yrs)   Types: Cigarettes   Start date: 07/01/1978  Smokeless Tobacco Never  Tobacco Comments   Former smoker 04/10/23    Goals Met:  Proper associated with RPD/PD & O2 Sat Exercise tolerated well No report of concerns or symptoms today Strength training completed today  Goals Unmet:  Not Applicable  Comments: Service time is from 1325 to 1445.    Dr. Slater Staff is Medical Director for Pulmonary Rehab at [facility].

## 2024-01-05 ENCOUNTER — Encounter: Payer: Self-pay | Admitting: Pulmonary Disease

## 2024-01-06 ENCOUNTER — Encounter (HOSPITAL_COMMUNITY)
Admission: RE | Admit: 2024-01-06 | Discharge: 2024-01-06 | Disposition: A | Discharge status: Home / Self Care | Source: Ambulatory Visit | Attending: Pulmonary Disease | Admitting: Pulmonary Disease

## 2024-01-06 VITALS — Wt 157.6 lb

## 2024-01-06 DIAGNOSIS — J984 Other disorders of lung: Secondary | ICD-10-CM | POA: Diagnosis not present

## 2024-01-06 DIAGNOSIS — I272 Pulmonary hypertension, unspecified: Secondary | ICD-10-CM | POA: Diagnosis not present

## 2024-01-06 NOTE — Progress Notes (Signed)
 Daily Session Note  Patient Details  Name: Amber Bennett MRN: [id number] Date of Birth: 04/07/1951 Referring Provider:   Conrad Ports Pulmonary Rehab Walk Test from 12/19/2023 in [facility] for Heart, Vascular, & Lung [facility]  Referring Provider Dr. Lonni Nanas    Encounter Date: 01/06/2024  Check In:  Session Check In - 01/06/24 1411       Check-In   Supervising physician immediately available to respond to emergencies [facility] MD immediately available    Physician(s) Damien Braver, NP    Location [facility]    Staff Present Johnnie Moats, MS, ACSM-CEP, Exercise Physiologist;Mary Harvy, RN, BSN;Zenna Traister Midge BS, ACSM-CEP, Exercise Physiologist;Samantha Belarus, RD, Maximo Sharps, RT    Virtual Visit No    Medication changes reported     No    Fall or balance concerns reported    No    Tobacco Cessation No Change    Warm-up and Cool-down Performed as group-led instruction    Resistance Training Performed Yes    VAD Patient? No    PAD/SET Patient? No      Pain Assessment   Currently in Pain? No/denies          Capillary Blood Glucose: No results found for this or any previous visit (from the past 24 hours).   Exercise Prescription Changes - 01/06/24 1400       Response to Exercise   Blood Pressure (Admit) 114/58    Blood Pressure (Exercise) 118/58    Blood Pressure (Exit) 114/60    Heart Rate (Admit) 76 bpm    Heart Rate (Exercise) 79 bpm    Heart Rate (Exit) 68 bpm    Oxygen Saturation (Admit) 95 %    Oxygen Saturation (Exercise) 96 %    Oxygen Saturation (Exit) 98 %    Rating of Perceived Exertion (Exercise) 11    Perceived Dyspnea (Exercise) 1    Duration Continue with 30 min of aerobic exercise without signs/symptoms of physical distress.    Intensity THRR unchanged      Progression   Progression Continue to progress workloads to maintain intensity without signs/symptoms of physical distress.      Resistance  Training   Training Prescription Yes    Weight red bands    Reps 10-15    Time 10 Minutes      NuStep   Level 2    SPM 98    Minutes 15    METs 2.5      Recumbant Elliptical   Level 2    Minutes 15          Social History   Tobacco Use  Smoking Status Former   Current packs/day: 1.50   Average packs/day: 1.5 packs/day for 45.5 years (68.3 ttl pk-yrs)   Types: Cigarettes   Start date: 07/01/1978  Smokeless Tobacco Never  Tobacco Comments   Former smoker 04/10/23    Goals Met:  Independence with exercise equipment Exercise tolerated well No report of concerns or symptoms today Strength training completed today  Goals Unmet:  Not Applicable  Comments: Service time is from 1331 to 1435.    Dr. Slater Staff is Medical Director for Pulmonary Rehab at [facility].

## 2024-01-07 DIAGNOSIS — H10411 Chronic giant papillary conjunctivitis, right eye: Secondary | ICD-10-CM | POA: Diagnosis not present

## 2024-01-07 NOTE — Progress Notes (Signed)
 Pulmonary Individual Treatment Plan  Patient Details  Name: Amber Bennett MRN: [id number] Date of Birth: June 13, 1951 Referring Provider:   Conrad Bennett Pulmonary Rehab Walk Test from 12/19/2023 in [facility]  Referring Provider Dr. Lonni Bennett    Initial Encounter Date:  Flowsheet Row Pulmonary Rehab Walk Test from 12/19/2023 in [facility]  Date 12/19/23    Visit Diagnosis: Restrictive lung disease  Pulmonary HTN (HCC)  Patient's Home Medications on Admission:   Current Outpatient Medications:    acetaminophen  (TYLENOL ) 500 MG tablet, Take 500 mg by mouth every 6 (six) hours as needed for moderate pain (pain score 4-6) or mild pain (pain score 1-3)., Disp: , Rfl:    Alirocumab  (PRALUENT ) 150 MG/ML SOAJ, Inject 1 mL (150 mg total) into the skin every 14 (fourteen) days., Disp: 6 mL, Rfl: 3   amLODipine  (NORVASC ) 2.5 MG tablet, Take 1 tablet (2.5 mg total) by mouth at bedtime., Disp: 90 tablet, Rfl: 3   apixaban  (ELIQUIS ) 5 MG TABS tablet, Take 1 tablet (5 mg total) by mouth 2 (two) times daily., Disp: 180 tablet, Rfl: 1   Ascorbic Acid (VITAMIN C) 1000 MG tablet, Take 1,000 mg by mouth daily. Airborne emergen c, Disp: , Rfl:    beclomethasone (QVAR  REDIHALER) 80 MCG/ACT inhaler, Inhale 1 puff into the lungs 2 (two) times daily., Disp: 31.8 g, Rfl: 1   Calcium Carb-Cholecalciferol  (CALTRATE 600+D3 PO), Take 1 tablet by mouth daily., Disp: , Rfl:    Cholecalciferol  (VITAMIN D ) 50 MCG (2000 UT) tablet, Take 2,000 Units by mouth daily., Disp: , Rfl:    Coenzyme Q10 (CO Q 10 PO), Take 200 mg by mouth daily., Disp: , Rfl:    colestipol  (COLESTID ) 1 g tablet, TAKE 2 TABLETS(2 GRAMS) BY MOUTH AT BEDTIME, Disp: 180 tablet, Rfl: 0   cyclobenzaprine  (FLEXERIL ) 5 MG tablet, Take 1 tablet (5 mg total) by mouth every 8 (eight) hours as needed., Disp: 30 tablet, Rfl: 1    diphenoxylate -atropine  (LOMOTIL ) 2.5-0.025 MG tablet, Take 1-2 tablets by mouth every 4 hours as needed for diarrhea/loose stools, Disp: 90 tablet, Rfl: 1   DULoxetine  (CYMBALTA ) 30 MG capsule, Take 1 capsule (30 mg total) by mouth every evening., Disp: 90 capsule, Rfl: 4   ezetimibe  (ZETIA ) 10 MG tablet, Take 10 mg by mouth every evening., Disp: , Rfl:    furosemide  (LASIX ) 40 MG tablet, Take 2 tablets (80 mg total) by mouth daily., Disp: 90 tablet, Rfl: 3   gabapentin  (NEURONTIN ) 800 MG tablet, Take 1 tablet (800 mg total) by mouth 2 (two) times daily., Disp: 180 tablet, Rfl: 3   ibandronate (BONIVA) 150 MG tablet, Take 150 mg by mouth every 30 (thirty) days., Disp: , Rfl:    ipratropium (ATROVENT ) 0.03 % nasal spray, Place 2 sprays into both nostrils every 12 (twelve) hours., Disp: 30 mL, Rfl: 4   ipratropium-albuterol  (DUONEB) 0.5-2.5 (3) MG/3ML SOLN, Take 3 mLs by nebulization 3 (three) times daily as needed., Disp: 360 mL, Rfl: 5   loratadine  (CLARITIN ) 10 MG tablet, Take 1 tablet (10 mg total) by mouth daily as needed for allergies (Can take an extra dose during flare ups.)., Disp: 90 tablet, Rfl: 1   metoprolol  tartrate (LOPRESSOR ) 50 MG tablet, Take 1 tablet (50 mg total) by mouth 2 (two) times daily., Disp: , Rfl:    mometasone  (ASMANEX ) 220 MCG/ACT inhaler, Inhale 1 puff into  the lungs 2 (two) times daily., Disp: 3 each, Rfl: 1   Multiple Vitamins-Minerals (MULTIVITAMIN PO), Take 1 tablet by mouth daily., Disp: , Rfl:    Multiple Vitamins-Minerals (PRESERVISION AREDS PO), Take 1 capsule by mouth daily., Disp: , Rfl:    Olopatadine-Mometasone  (RYALTRIS ) 665-25 MCG/ACT SUSP, Place 2 sprays into the nose 2 (two) times daily., Disp: 87 g, Rfl: 1   omeprazole (PRILOSEC) 20 MG capsule, Take 20 mg by mouth 2 (two) times daily before a meal., Disp: , Rfl: 3   PHENobarbital  (LUMINAL) 97.2 MG tablet, Take 1.5 tablets (145.8 mg total) by mouth daily., Disp: 135 tablet, Rfl: 2   potassium chloride  SA  (KLOR-CON  M) 20 MEQ tablet, While taking 40 mg of Furosemide , take Potassium Chloride  20 meq twice a day. When you decrease Furosemide  to 20 mg, decrease Potassium Chloride  to 20 meq once a day., Disp: 180 tablet, Rfl: 3   PRALUENT  150 MG/ML SOAJ, Inject 150 mg into the skin every 14 (fourteen) days., Disp: , Rfl:    pyridoxine  (B-6) 100 MG tablet, Take 100 mg by mouth daily., Disp: , Rfl:    sacubitril-valsartan (ENTRESTO ) 49-51 MG, Take 1 tablet by mouth 2 (two) times daily., Disp: 60 tablet, Rfl: 1   topiramate  (TOPAMAX ) 50 MG tablet, TAKE 1 TABLET BY MOUTH IN THE MORNING AND 2 TABLETS BY MOUTH EVERY EVENING, Disp: 90 tablet, Rfl: 0   vitamin E 400 UNIT capsule, Take 400 Units by mouth daily., Disp: , Rfl:    ZEPBOUND  2.5 MG/0.5ML Pen, Inject 2.5 mg into the skin once a week. 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   Carpal tunnel syndrome    Cervical radiculitis    Chronic diarrhea 10/24/2023   Collar bone fracture    Depression    Dermatomyositis (HCC)    Diabetes mellitus without complication (HCC)    Dysrhythmia    Elevated liver enzymes    External hemorrhoids    Fatty liver    Fibromyalgia    Fibromyalgia affecting hand 08/2022   bilateral wrist and hands   Hematuria    negative evaluation   High cholesterol    HTN (hypertension)    Internal hemorrhoids    Laryngitis    Lower back pain    Macular degeneration    Nonalcoholic steatohepatitis (NASH)    Nonalcoholic steatohepatitis (NASH)    Osteopenia    Left side hip   Seizures (HCC)    09/07/2019 pt reports last seizure in 2014   Tubular adenoma of colon     Tobacco Use: Social History   Tobacco Use  Smoking Status Former   Current packs/day: 1.50   Average  packs/day: 1.5 packs/day for 45.5 years (68.3 ttl pk-yrs)   Types: Cigarettes   Start date: 07/01/1978  Smokeless Tobacco Never  Tobacco Comments   Former smoker 04/10/23    Labs: Review Flowsheet       Latest Ref Rng & Units 02/05/2022 11/27/2023  Labs for ITP Cardiac and Pulmonary Rehab  Hemoglobin A1c 4.8 - 5.6 % 4.9  -  Bicarbonate 20.0 - 28.0 mmol/L - 21.7  23.0  22.1   TCO2 22 - 32 mmol/L - 23  24  23   Acid-base deficit 0.0 - 2.0 mmol/L - 3.0  2.0  3.0   O2 Saturation % - 79  78  78     Details       Multiple values from one day are sorted in reverse-chronological order         Capillary Blood Glucose: Lab Results  Component Value Date   GLUCAP 106 (H) 11/27/2023   GLUCAP 99 09/19/2023   GLUCAP 122 (H) 09/19/2023   GLUCAP 153 (H) 03/13/2023   GLUCAP 111 (H) 03/13/2023     Pulmonary Assessment Scores:  Pulmonary Assessment Scores     Row Name 12/19/23 1416         ADL UCSD   ADL Phase Entry     SOB Score total 28       CAT Score   CAT Score 18       mMRC Score   mMRC Score 0       UCSD: Self-administered rating of dyspnea associated with activities of daily living (ADLs) 6-point scale (0 = not at all to 5 = maximal or unable to do because of breathlessness)  Scoring Scores range from 0 to 120.  Minimally important difference is 5 units  CAT: CAT can identify the health impairment of COPD patients and is better correlated with disease progression.  CAT has a scoring range of zero to 40. The CAT score is classified into four groups of low (less than 10), medium (10 - 20), high (21-30) and very high (31-40) based on the impact level of disease on health status. A CAT score over 10 suggests significant symptoms.  A worsening CAT score could be explained by an exacerbation, poor medication adherence, poor inhaler technique, or progression of COPD or comorbid conditions.  CAT MCID is 2 points  mMRC: mMRC (Modified Medical Research Council) Dyspnea  Scale is used to assess the degree of baseline functional disability in patients of respiratory disease due to dyspnea. No minimal important difference is established. A decrease in score of 1 point or greater is considered a positive change.   Pulmonary Function Assessment:  Pulmonary Function Assessment - 12/19/23 1415       Breath   Shortness of Breath Yes;Limiting activity          Exercise Target Goals: Exercise Program Goal: Individual exercise prescription set using results from initial 6 min walk test and THRR while considering  patient's activity barriers and safety.   Exercise Prescription Goal: Initial exercise prescription builds to 30-45 minutes a day of aerobic activity, 2-3 days per week.  Home exercise guidelines will be given to patient during program as part of exercise prescription that the participant will acknowledge.  Activity Barriers & Risk Stratification:  Activity Barriers & Cardiac Risk Stratification - 12/19/23 1416       Activity Barriers & Cardiac Risk Stratification   Activity Barriers Back Problems;Neck/Spine Problems;Right Hip Replacement;Balance Concerns;Arthritis;Joint Problems;History of Falls          6 Minute Walk:  6 Minute Walk     Row Name 12/19/23 1522         6 Minute Walk   Phase Initial     Distance 872 feet     Walk Time 6 minutes     # of Rest Breaks 0     MPH 1.65     METS 1.59     RPE 11     Perceived Dyspnea  1     VO2 Peak  5.55     Symptoms No     Resting HR 83 bpm     Resting BP 126/62     Resting Oxygen Saturation  98 %     Exercise Oxygen Saturation  during 6 min walk 96 %     Max Ex. HR 100 bpm     Max Ex. BP 146/62     2 Minute Post BP 130/60       Interval HR   1 Minute HR 90     2 Minute HR 94     3 Minute HR 98     4 Minute HR 100     5 Minute HR 98     6 Minute HR 98     2 Minute Post HR 84     Interval Heart Rate? Yes       Interval Oxygen   Interval Oxygen? Yes     Baseline Oxygen  Saturation % 98 %     1 Minute Oxygen Saturation % 98 %     1 Minute Liters of Oxygen 0 L     2 Minute Oxygen Saturation % 96 %     2 Minute Liters of Oxygen 0 L     3 Minute Oxygen Saturation % 97 %     3 Minute Liters of Oxygen 0 L     4 Minute Oxygen Saturation % 98 %     4 Minute Liters of Oxygen 0 L     5 Minute Oxygen Saturation % 98 %     5 Minute Liters of Oxygen 0 L     6 Minute Oxygen Saturation % 97 %     6 Minute Liters of Oxygen 0 L     2 Minute Post Oxygen Saturation % 99 %     2 Minute Post Liters of Oxygen 0 L        Oxygen Initial Assessment:  Oxygen Initial Assessment - 12/19/23 1414       Home Oxygen   Home Oxygen Device None    Sleep Oxygen Prescription CPAP    Liters per minute 3    Home Exercise Oxygen Prescription None    Home Resting Oxygen Prescription None    Compliance with Home Oxygen Use Yes      Initial 6 min Walk   Oxygen Used None      Program Oxygen Prescription   Program Oxygen Prescription None      Intervention   Short Term Goals To learn and exhibit compliance with exercise, home and travel O2 prescription;To learn and understand importance of maintaining oxygen saturations>88%;To learn and demonstrate proper use of respiratory medications;To learn and understand importance of monitoring SPO2 with pulse oximeter and demonstrate accurate use of the pulse oximeter.;To learn and demonstrate proper pursed lip breathing techniques or other breathing techniques.     Long  Term Goals Verbalizes importance of monitoring SPO2 with pulse oximeter and return demonstration;Exhibits compliance with exercise, home  and travel O2 prescription;Maintenance of O2 saturations>88%;Exhibits proper breathing techniques, such as pursed lip breathing or other method taught during program session;Compliance with respiratory medication;Demonstrates proper use of MDI's          Oxygen Re-Evaluation:  Oxygen Re-Evaluation     Row Name 01/01/24 0849              Program Oxygen Prescription   Program Oxygen Prescription None         Home Oxygen   Home  Oxygen Device None       Sleep Oxygen Prescription CPAP       Liters per minute 3       Home Exercise Oxygen Prescription None       Home Resting Oxygen Prescription None       Compliance with Home Oxygen Use Yes         Goals/Expected Outcomes   Short Term Goals To learn and exhibit compliance with exercise, home and travel O2 prescription;To learn and understand importance of maintaining oxygen saturations>88%;To learn and demonstrate proper use of respiratory medications;To learn and understand importance of monitoring SPO2 with pulse oximeter and demonstrate accurate use of the pulse oximeter.;To learn and demonstrate proper pursed lip breathing techniques or other breathing techniques.        Long  Term Goals Verbalizes importance of monitoring SPO2 with pulse oximeter and return demonstration;Exhibits compliance with exercise, home  and travel O2 prescription;Maintenance of O2 saturations>88%;Exhibits proper breathing techniques, such as pursed lip breathing or other method taught during program session;Compliance with respiratory medication;Demonstrates proper use of MDI's       Goals/Expected Outcomes Compliance and understanding of oxygen saturation monitoring and breathing techniques to decrease shortness of breath.          Oxygen Discharge (Final Oxygen Re-Evaluation):  Oxygen Re-Evaluation - 01/01/24 0849       Program Oxygen Prescription   Program Oxygen Prescription None      Home Oxygen   Home Oxygen Device None    Sleep Oxygen Prescription CPAP    Liters per minute 3    Home Exercise Oxygen Prescription None    Home Resting Oxygen Prescription None    Compliance with Home Oxygen Use Yes      Goals/Expected Outcomes   Short Term Goals To learn and exhibit compliance with exercise, home and travel O2 prescription;To learn and understand importance of maintaining oxygen  saturations>88%;To learn and demonstrate proper use of respiratory medications;To learn and understand importance of monitoring SPO2 with pulse oximeter and demonstrate accurate use of the pulse oximeter.;To learn and demonstrate proper pursed lip breathing techniques or other breathing techniques.     Long  Term Goals Verbalizes importance of monitoring SPO2 with pulse oximeter and return demonstration;Exhibits compliance with exercise, home  and travel O2 prescription;Maintenance of O2 saturations>88%;Exhibits proper breathing techniques, such as pursed lip breathing or other method taught during program session;Compliance with respiratory medication;Demonstrates proper use of MDI's    Goals/Expected Outcomes Compliance and understanding of oxygen saturation monitoring and breathing techniques to decrease shortness of breath.          Initial Exercise Prescription:  Initial Exercise Prescription - 12/19/23 1500       Date of Initial Exercise RX and Referring Provider   Date 12/19/23    Referring Provider Dr. Lonni Bennett    Expected Discharge Date 03/18/24      NuStep   Level 2    SPM 60    Minutes 15    METs 1.1      Recumbant Elliptical   Level 1    RPM 26    Watts 12    Minutes 15    METs 1.2      Prescription Details   Frequency (times per week) 2    Duration Progress to 30 minutes of continuous aerobic without signs/symptoms of physical distress      Intensity   THRR 40-80% of Max Heartrate 58-116    Ratings of Perceived Exertion 11-13  Perceived Dyspnea 0-4      Progression   Progression Continue to progress workloads to maintain intensity without signs/symptoms of physical distress.      Resistance Training   Training Prescription Yes    Weight red bands    Reps 10-15          Perform Capillary Blood Glucose checks as needed.  Exercise Prescription Changes:   Exercise Prescription Changes     Row Name 01/06/24 1400             Response  to Exercise   Blood Pressure (Admit) 114/58       Blood Pressure (Exercise) 118/58       Blood Pressure (Exit) 114/60       Heart Rate (Admit) 76 bpm       Heart Rate (Exercise) 79 bpm       Heart Rate (Exit) 68 bpm       Oxygen Saturation (Admit) 95 %       Oxygen Saturation (Exercise) 96 %       Oxygen Saturation (Exit) 98 %       Rating of Perceived Exertion (Exercise) 11       Perceived Dyspnea (Exercise) 1       Duration Continue with 30 min of aerobic exercise without signs/symptoms of physical distress.       Intensity THRR unchanged         Progression   Progression Continue to progress workloads to maintain intensity without signs/symptoms of physical distress.         Resistance Training   Training Prescription Yes       Weight red bands       Reps 10-15       Time 10 Minutes         NuStep   Level 2       SPM 98       Minutes 15       METs 2.5         Recumbant Elliptical   Level 2       Minutes 15          Exercise Comments:   Exercise Comments     Row Name 12/30/23 1522           Exercise Comments Pt completed first day of group exercise. She exercised on the recumbent stepper for 15 min, level 2, METs 2.7. She then exercised on the recumbent elliptical for 15 min, level 1, METs around 3. She needed occasionally rest breaks due to a groin muscle that tenses with exercise. She performed warm up and cool down standing, including squats. Discussed METs with fair reception.          Exercise Goals and Review:   Exercise Goals     Row Name 12/19/23 1417             Exercise Goals   Increase Physical Activity Yes       Intervention Provide advice, education, support and counseling about physical activity/exercise needs.;Develop an individualized exercise prescription for aerobic and resistive training based on initial evaluation findings, risk stratification, comorbidities and participant's personal goals.       Expected Outcomes Short Term: Attend  rehab on a regular basis to increase amount of physical activity.;Long Term: Add in home exercise to make exercise part of routine and to increase amount of physical activity.;Long Term: Exercising regularly at least 3-5 days a week.  Increase Strength and Stamina Yes       Intervention Provide advice, education, support and counseling about physical activity/exercise needs.;Develop an individualized exercise prescription for aerobic and resistive training based on initial evaluation findings, risk stratification, comorbidities and participant's personal goals.       Expected Outcomes Short Term: Increase workloads from initial exercise prescription for resistance, speed, and METs.;Short Term: Perform resistance training exercises routinely during rehab and add in resistance training at home;Long Term: Improve cardiorespiratory fitness, muscular endurance and strength as measured by increased METs and functional capacity ( )       Able to understand and use rate of perceived exertion (RPE) scale Yes       Intervention Provide education and explanation on how to use RPE scale       Expected Outcomes Short Term: Able to use RPE daily in rehab to express subjective intensity level;Long Term:  Able to use RPE to guide intensity level when exercising independently       Able to understand and use Dyspnea scale Yes       Intervention Provide education and explanation on how to use Dyspnea scale       Expected Outcomes Short Term: Able to use Dyspnea scale daily in rehab to express subjective sense of shortness of breath during exertion;Long Term: Able to use Dyspnea scale to guide intensity level when exercising independently       Knowledge and understanding of Target Heart Rate Range (THRR) Yes       Intervention Provide education and explanation of THRR including how the numbers were predicted and where they are located for reference       Expected Outcomes Short Term: Able to state/look up  THRR;Long Term: Able to use THRR to govern intensity when exercising independently;Short Term: Able to use daily as guideline for intensity in rehab       Understanding of Exercise Prescription Yes       Intervention Provide education, explanation, and written materials on patient's individual exercise prescription       Expected Outcomes Short Term: Able to explain program exercise prescription;Long Term: Able to explain home exercise prescription to exercise independently          Exercise Goals Re-Evaluation :  Exercise Goals Re-Evaluation     Row Name 01/01/24 0849             Exercise Goal Re-Evaluation   Exercise Goals Review Increase Physical Activity;Able to understand and use Dyspnea scale;Understanding of Exercise Prescription;Increase Strength and Stamina;Knowledge and understanding of Target Heart Rate Range (THRR);Able to understand and use rate of perceived exertion (RPE) scale       Comments Pt completed one exercise session. She exercised on the recumbent stepper for 15 min, level 2, METs 2.7. She then exercised on the recumbent elliptical for 15 min, level 1, METs approximately 3. She pushed herself but also has to take breaks due to hip/groin cramps. She is motivated. Performed warm up and cool down without significant limitations. Using blue bands, 5.8 lbs. Will progress as tolerated.       Expected Outcomes Through exercise at rehab and home, the patient will decrease shortness of breath and feel confident in carrying out an exercise regimen at home.          Discharge Exercise Prescription (Final Exercise Prescription Changes):  Exercise Prescription Changes - 01/06/24 1400       Response to Exercise   Blood Pressure (Admit) 114/58    Blood Pressure (  Exercise) 118/58    Blood Pressure (Exit) 114/60    Heart Rate (Admit) 76 bpm    Heart Rate (Exercise) 79 bpm    Heart Rate (Exit) 68 bpm    Oxygen Saturation (Admit) 95 %    Oxygen Saturation (Exercise) 96 %     Oxygen Saturation (Exit) 98 %    Rating of Perceived Exertion (Exercise) 11    Perceived Dyspnea (Exercise) 1    Duration Continue with 30 min of aerobic exercise without signs/symptoms of physical distress.    Intensity THRR unchanged      Progression   Progression Continue to progress workloads to maintain intensity without signs/symptoms of physical distress.      Resistance Training   Training Prescription Yes    Weight red bands    Reps 10-15    Time 10 Minutes      NuStep   Level 2    SPM 98    Minutes 15    METs 2.5      Recumbant Elliptical   Level 2    Minutes 15          Nutrition:  Target Goals: Understanding of nutrition guidelines, daily intake of sodium 1500mg , cholesterol 200mg , calories 30% from fat and 7% or less from saturated fats, daily to have 5 or more servings of fruits and vegetables.  Biometrics:    Nutrition Therapy Plan and Nutrition Goals:  Nutrition Therapy & Goals - 01/01/24 1455       Nutrition Therapy   Diet General healthy diet      Personal Nutrition Goals   Nutrition Goal Patient to improve diet quality by using the plate method as a guide for meal planning to include lean protein/plant protein, fruits, vegetables, whole grains, nonfat dairy as part of a well-balanced diet.    Personal Goal #2 Patient to identify strategies for weight loss of 0.5-2.0# per week.    Comments Rayhana has medical history restrictive lung disease, pulmonary HTN, heart failure, NASH, OSA. She is taking zepbound  for weight loss and has started following the Mediterranean diet. She has maintained her weight since starting the medication. Will continue to educate on strategies for weight loss including calorie density, the plate method as a guide for meal planning, etc.  Patient will benefit from participation in pulmonary  rehab for nutrition education, exercise, and lifestyle modification.      Intervention Plan   Intervention Prescribe, educate and  counsel regarding individualized specific dietary modifications aiming towards targeted core components such as weight, hypertension, lipid management, diabetes, heart failure and other comorbidities.;Nutrition handout(s) given to patient.    Expected Outcomes Short Term Goal: Understand basic principles of dietary content, such as calories, fat, sodium, cholesterol and nutrients.;Long Term Goal: Adherence to prescribed nutrition plan.          Nutrition Assessments:  Nutrition Assessments - 12/30/23 1521       Rate Your Plate Scores   Pre Score 74         MEDIFICTS Score Key: >=70 Need to make dietary changes  40-70 Heart Healthy Diet <= 40 Therapeutic Level Cholesterol Diet  Flowsheet Row PULMONARY REHAB OTHER RESPIRATORY from 12/30/2023 in [facility]  Picture Your Plate Total Score on Admission 74   Picture Your Plate Scores: <59 Unhealthy dietary pattern with much room for improvement. 41-50 Dietary pattern unlikely to meet recommendations for good health and room for improvement. 51-60 More healthful dietary  pattern, with some room for improvement.  >60 Healthy dietary pattern, although there may be some specific behaviors that could be improved.    Nutrition Goals Re-Evaluation:  Nutrition Goals Re-Evaluation     Row Name 01/01/24 1455             Goals   Current Weight 160 lb 11.5 oz (72.9 kg)       Comment A1c WNL       Expected Outcome Lillionna has medical history restrictive lung disease, pulmonary HTN, heart failure, NASH, OSA. She is taking zepbound  for weight loss and has started following the Mediterranean diet. She has maintained her weight since starting the medication. Will continue to educate on strategies for weight loss including calorie density, the plate method as a guide for meal planning, etc. Patient will benefit from participation in pulmonary rehab for nutrition education, exercise, and  lifestyle modification.          Nutrition Goals Discharge (Final Nutrition Goals Re-Evaluation):  Nutrition Goals Re-Evaluation - 01/01/24 1455       Goals   Current Weight 160 lb 11.5 oz (72.9 kg)    Comment A1c WNL    Expected Outcome Dhwani has medical history restrictive lung disease, pulmonary HTN, heart failure, NASH, OSA. She is taking zepbound  for weight loss and has started following the Mediterranean diet. She has maintained her weight since starting the medication. Will continue to educate on strategies for weight loss including calorie density, the plate method as a guide for meal planning, etc. Patient will benefit from participation in pulmonary rehab for nutrition education, exercise, and lifestyle modification.          Psychosocial: Target Goals: Acknowledge presence or absence of significant depression and/or stress, maximize coping skills, provide positive support system. Participant is able to verbalize types and ability to use techniques and skills needed for reducing stress and depression.  Initial Review & Psychosocial Screening:  Initial Psych Review & Screening - 12/19/23 1410       Initial Review   Current issues with None Identified      Family Dynamics   Good Support System? Yes    Comments Val's son and spouse      Barriers   Psychosocial barriers to participate in program There are no identifiable barriers or psychosocial needs.      Screening Interventions   Interventions Encouraged to exercise          Quality of Life Scores:  Scores of 19 and below usually indicate a poorer quality of life in these areas.  A difference of  2-3 points is a clinically meaningful difference.  A difference of 2-3 points in the total score of the Quality of Life Index has been associated with significant improvement in overall quality of life, self-image, physical symptoms, and general health in studies assessing change in quality of life.  PHQ-9: Review  Flowsheet       12/19/2023 06/18/2016  Depression screen PHQ 2/9  Decreased Interest 0 0  Down, Depressed, Hopeless 0 0  PHQ - 2 Score 0 0  Altered sleeping 0 0  Tired, decreased energy 1 0  Change in appetite 0 0  Feeling bad or failure about yourself  0 0  Trouble concentrating 0 0  Moving slowly or fidgety/restless 0 0  Suicidal thoughts 0 0   PHQ-9 Score 1 0  Difficult doing work/chores Somewhat difficult -    Details       Data saved with a  previous flowsheet row definition        Interpretation of Total Score  Total Score Depression Severity:  1-4 = Minimal depression, 5-9 = Mild depression, 10-14 = Moderate depression, 15-19 = Moderately severe depression, 20-27 = Severe depression   Psychosocial Evaluation and Intervention:  Psychosocial Evaluation - 12/19/23 1411       Psychosocial Evaluation & Interventions   Interventions Encouraged to exercise with the program and follow exercise prescription    Comments Cierra denies any psychosocial barriers at this time.    Expected Outcomes For Anabia to particpate in [place] without any psychosocial barriers    Continue Psychosocial Services  No Follow up required          Psychosocial Re-Evaluation:  Psychosocial Re-Evaluation     Row Name 01/05/24 1056             Psychosocial Re-Evaluation   Current issues with None Identified       Comments Monthly psychosocial re-evaluation is as follows: Keashia has attended 2 sessions so far. She denies any psychosocial barriers or concerns. We will continue to monitor and will assess her needs.       Expected Outcomes For Starlene to particpate in PR without any psychosocial barriers       Interventions Encouraged to attend Pulmonary Rehabilitation for the exercise       Continue Psychosocial Services  No Follow up required          Psychosocial Discharge (Final Psychosocial Re-Evaluation):  Psychosocial Re-Evaluation - 01/05/24 1056       Psychosocial  Re-Evaluation   Current issues with None Identified    Comments Monthly psychosocial re-evaluation is as follows: Genea has attended 2 sessions so far. She denies any psychosocial barriers or concerns. We will continue to monitor and will assess her needs.    Expected Outcomes For Indya to particpate in PR without any psychosocial barriers    Interventions Encouraged to attend Pulmonary Rehabilitation for the exercise    Continue Psychosocial Services  No Follow up required          Education: Education Goals: Education classes will be provided on a weekly basis, covering required topics. Participant will state understanding/return demonstration of topics presented.  Learning Barriers/Preferences:  Learning Barriers/Preferences - 12/19/23 1411       Learning Barriers/Preferences   Learning Barriers Sight    Learning Preferences Pictoral;Written Material;Video;Skilled Demonstration          Education Topics: Know Your Numbers Group instruction that is supported by a PowerPoint presentation. Instructor discusses importance of knowing and understanding resting, exercise, and post-exercise oxygen saturation, heart rate, and blood pressure. Oxygen saturation, heart rate, blood pressure, rating of perceived exertion, and dyspnea are reviewed along with a normal range for these values.    Exercise for the Pulmonary Patient Group instruction that is supported by a PowerPoint presentation. Instructor discusses benefits of exercise, core components of exercise, frequency, duration, and intensity of an exercise routine, importance of utilizing pulse oximetry during exercise, safety while exercising, and options of places to exercise outside of rehab.    MET Level  Group instruction provided by PowerPoint, verbal discussion, and written material to support subject matter. Instructor reviews what METs are and how to increase METs.    Pulmonary Medications Verbally interactive group  education provided by instructor with focus on inhaled medications and proper administration.   Anatomy and Physiology of the Respiratory System Group instruction provided by PowerPoint, verbal discussion, and written material to support  subject matter. Instructor reviews respiratory cycle and anatomical components of the respiratory system and their functions. Instructor also reviews differences in obstructive and restrictive respiratory diseases with examples of each.    Oxygen Safety Group instruction provided by PowerPoint, verbal discussion, and written material to support subject matter. There is an overview of "What is Oxygen" and "Why do we need it".  Instructor also reviews how to create a safe environment for oxygen use, the importance of using oxygen as prescribed, and the risks of noncompliance. There is a brief discussion on traveling with oxygen and resources the patient may utilize. Flowsheet Row PULMONARY REHAB OTHER RESPIRATORY from 01/01/2024 in [facility]  Date 01/01/24  Educator RN  Instruction Review Code 1- Verbalizes Understanding    Oxygen Use Group instruction provided by PowerPoint, verbal discussion, and written material to discuss how supplemental oxygen is prescribed and different types of oxygen supply systems. Resources for more information are provided.    Breathing Techniques Group instruction that is supported by demonstration and informational handouts. Instructor discusses the benefits of pursed lip and diaphragmatic breathing and detailed demonstration on how to perform both.     Risk Factor Reduction Group instruction that is supported by a PowerPoint presentation. Instructor discusses the definition of a risk factor, different risk factors for pulmonary disease, and how the heart and lungs work together.   Pulmonary Diseases Group instruction provided by PowerPoint, verbal discussion, and  written material to support subject matter. Instructor gives an overview of the different type of pulmonary diseases. There is also a discussion on risk factors and symptoms as well as ways to manage the diseases.   Stress and Energy Conservation Group instruction provided by PowerPoint, verbal discussion, and written material to support subject matter. Instructor gives an overview of stress and the impact it can have on the body. Instructor also reviews ways to reduce stress. There is also a discussion on energy conservation and ways to conserve energy throughout the day.   Warning Signs and Symptoms Group instruction provided by PowerPoint, verbal discussion, and written material to support subject matter. Instructor reviews warning signs and symptoms of stroke, heart attack, cold and flu. Instructor also reviews ways to prevent the spread of infection.   Other Education Group or individual verbal, written, or video instructions that support the educational goals of the pulmonary rehab program.    Knowledge Questionnaire Score:  Knowledge Questionnaire Score - 12/19/23 1424       Knowledge Questionnaire Score   Pre Score 15/18          Core Components/Risk Factors/Patient Goals at Admission:  Personal Goals and Risk Factors at Admission - 12/19/23 1412       Core Components/Risk Factors/Patient Goals on Admission    Weight Management Yes;Weight Loss    Intervention Weight Management: Develop a combined nutrition and exercise program designed to reach desired caloric intake, while maintaining appropriate intake of nutrient and fiber, sodium and fats, and appropriate energy expenditure required for the weight goal.;Weight Management: Provide education and appropriate resources to help participant work on and attain dietary goals.;Weight Management/Obesity: Establish reasonable short term and long term weight goals.;Obesity: Provide education and appropriate resources to help  participant work on and attain dietary goals.    Expected Outcomes Understanding of distribution of calorie intake throughout the day with the consumption of 4-5 meals/snacks;Understanding recommendations for meals to include 15-35% energy as protein, 25-35% energy from fat, 35-60% energy from  carbohydrates, less than 200mg  of dietary cholesterol, 20-35 gm of total fiber daily;Weight Loss: Understanding of general recommendations for a balanced deficit meal plan, which promotes 1-2 lb weight loss per week and includes a negative energy balance of [phone] kcal/d;Weight Maintenance: Understanding of the daily nutrition guidelines, which includes 25-35% calories from fat, 7% or less cal from saturated fats, less than 200mg  cholesterol, less than 1.5gm of sodium, & 5 or more servings of fruits and vegetables daily;Long Term: Adherence to nutrition and physical activity/exercise program aimed toward attainment of established weight goal;Short Term: Continue to assess and modify interventions until short term weight is achieved    Improve shortness of breath with ADL's Yes    Intervention Provide education, individualized exercise plan and daily activity instruction to help decrease symptoms of SOB with activities of daily living.    Expected Outcomes Short Term: Improve cardiorespiratory fitness to achieve a reduction of symptoms when performing ADLs;Long Term: Be able to perform more ADLs without symptoms or delay the onset of symptoms          Core Components/Risk Factors/Patient Goals Review:   Goals and Risk Factor Review     Row Name 01/05/24 1058             Core Components/Risk Factors/Patient Goals Review   Personal Goals Review Improve shortness of breath with ADL's;Develop more efficient breathing techniques such as purse lipped breathing and diaphragmatic breathing and practicing self-pacing with activity.;Weight Management/Obesity       Review Monthly review of patient's Core  Components/Risk Factors/Patient Goals are as follows: Goal progressing for improving shortness of breath with ADL's. Fortune has attended 2 sessions so far. She started exercising on the NuStep and the seated elliptical. She was able to keep her oxygen saturation WNLs with exertion. We started working with Freddy on developing more efficient breathing techniques such as purse lipped breathing and diaphragmatic breathing; and practicing self-pacing with activity. Our dietitian has met with Berwyn and assessed her caloric needs. Goal progressing on weight loss while in the program. Unable to assess any weight loss yet as she has only attended 2 sessions. We will continue to monitor Paidyn's progress throughout the program.       Expected Outcomes Pt will show progress toward meeting expected goals and outcomes.          Core Components/Risk Factors/Patient Goals at Discharge (Final Review):   Goals and Risk Factor Review - 01/05/24 1058       Core Components/Risk Factors/Patient Goals Review   Personal Goals Review Improve shortness of breath with ADL's;Develop more efficient breathing techniques such as purse lipped breathing and diaphragmatic breathing and practicing self-pacing with activity.;Weight Management/Obesity    Review Monthly review of patient's Core Components/Risk Factors/Patient Goals are as follows: Goal progressing for improving shortness of breath with ADL's. Marguerite has attended 2 sessions so far. She started exercising on the NuStep and the seated elliptical. She was able to keep her oxygen saturation WNLs with exertion. We started working with Freddy on developing more efficient breathing techniques such as purse lipped breathing and diaphragmatic breathing; and practicing self-pacing with activity. Our dietitian has met with Berwyn and assessed her caloric needs. Goal progressing on weight loss while in the program. Unable to assess any weight loss yet as she has only attended 2  sessions. We will continue to monitor November's progress throughout the program.    Expected Outcomes Pt will show progress toward meeting expected goals and outcomes.  ITP Comments: Pt is making expected progress toward Pulmonary Rehab goals after completing 3 session(s). Recommend continued exercise, life style modification, education, and utilization of breathing techniques to increase stamina and strength, while also decreasing shortness of breath with exertion.  Dr. Slater Staff is Medical Director for Pulmonary Rehab at [facility].

## 2024-01-08 ENCOUNTER — Encounter (HOSPITAL_COMMUNITY)
Admission: RE | Admit: 2024-01-08 | Discharge: 2024-01-08 | Disposition: A | Discharge status: Home / Self Care | Source: Ambulatory Visit | Attending: Pulmonary Disease | Admitting: Pulmonary Disease

## 2024-01-08 DIAGNOSIS — I272 Pulmonary hypertension, unspecified: Secondary | ICD-10-CM

## 2024-01-08 DIAGNOSIS — J984 Other disorders of lung: Secondary | ICD-10-CM

## 2024-01-08 NOTE — Progress Notes (Signed)
 Daily Session Note  Patient Details  Name: Amber Bennett MRN: [id number] Date of Birth: [birth year] Referring Provider:   Conrad Ports Pulmonary Rehab Walk Test from 12/19/2023 in [facility]  Referring Provider Dr. Lonni Nanas    Encounter Date: 01/08/2024  Check In:  Session Check In - 01/08/24 1353       Check-In   Supervising physician immediately available to respond to emergencies [facility] MD immediately available    Physician(s) Rosabel Mose, NP    Location [facility]    Staff Present Johnnie Moats, MS, ACSM-CEP, Exercise Physiologist;Mary Harvy, RN, Wetzel Sharps, RT    Virtual Visit No    Medication changes reported     No    Fall or balance concerns reported    No    Tobacco Cessation No Change    Warm-up and Cool-down Performed as group-led instruction    Resistance Training Performed Yes    VAD Patient? No    PAD/SET Patient? No      Pain Assessment   Currently in Pain? No/denies          Capillary Blood Glucose: No results found for this or any previous visit (from the past 24 hours).    Social History   Tobacco Use  Smoking Status Former   Current packs/day: 1.50   Average packs/day: 1.5 packs/day for 45.5 years (68.3 ttl pk-yrs)   Types: Cigarettes   Start date: 07/01/1978  Smokeless Tobacco Never  Tobacco Comments   Former smoker 04/10/23    Goals Met:  Proper associated with RPD/PD & O2 Sat Independence with exercise equipment Exercise tolerated well No report of concerns or symptoms today Strength training completed today  Goals Unmet:  Not Applicable  Comments: Service time is from 1330 to 1445.    Dr. Slater Staff is Medical Director for Pulmonary Rehab at [facility].

## 2024-01-11 NOTE — Progress Notes (Unsigned)
 Chief complaint:   Subjective:  Chief Complaint: follow-up for DOE and aspergillus colonization   Patient ID: Amber Bennett, female    DOB: September 11, 1950, 73 y.o.   MRN: [id number]  HPI   73 year old with former smoker (quit at age 49) seronegative rheumatoid arthritis dermatomyositis, followed by Lynwood Ramsay and on Plaquenil  and prednisone (currently being held) also with a seizure disorder that is currently managed with phenobarbital , chronic diarrhea, who apparently had pneumonia after her total hip replacement with Medford Poli in 2023. She also has  history of PAF and CHF having undergone an ablation for the former when it was thought to be driving her dyspnea. She does have some other confounders with recent weight gain from 140 to 167 # and diagnosis of OSA and I would query whether there might be an obesity hypoventilation syndrome restrictive lung disease (which was seen on PFTS) from the weight gain.    The italics below is from my original and subsquent notes before today:  I have reviewed imaging available prior to 2023 including a CT coronary study done in 2019.  This study seemed completely normal with regards to the lung parenchyma.     She then underwent THR by Dr. Poli in early August 2023.   He then had developed some dyspnea on exertion as well as coughing with phlegm production.  Was concerns for PE and a CT angiogram was ordered on February 18, 2022   CTA on [id number] showed:   IMPRESSION: 1. No evidence of pulmonary embolus. 2. Mild right upper lobe consolidation with surrounding ground-glass, likely infectious or inflammatory. Recommend follow-up chest CT in 3 months to ensure resolution. 3. Aortic Atherosclerosis (ICD10-I70.0).   She appears to have been prescribed a 5 day course of levaquin at this point in time.   He says that her symptoms improved in particular she had been having phlegm that she was producing and this did improve with the levofloxacin.   However the dyspnea on exertion remained.   Continues  struggle with dyspnea on exertion ever since then.   She has struggled with recurrent sinus infections which have typically responded to antibacterial antibiotics.   She however is continue to have trouble with the dyspnea on exertion.   She has a CPAP machine at home which at one point was not fitting correctly and which was causing some hypoxia based on her home sleep study.   This has been remedied.  She does however suffer from sneezing when she wears the CPAP religiously has fairly uncontrolled sneezing at least once per week.   She has seen Dr. Kozlow from Rheumatology who noted that her symptoms of DOE actually do NOT manifest themselves when she does aquatic exercises which again raises ? Could the weight and potential obesity ventilation by the issue and getting rid of some gravity helps here?     CT lung [id number] showed   IMPRESSION: Minimal ground-glass opacities in the right upper lung may represent scarring from prior inflammatory changes in this area.   She then had a CT scan  t in January 30th of 2025 showed:   MPRESSION: 1. New clustered heterogeneous airspace opacity and nodularity in the inferior right upper lobe measuring 2.4 x 1.6 cm. Additional tiny nodules and small consolidations in the medial left upper lobe and in the right middle lobe. Findings are most consistent with multifocal atypical infection. 2. Mild, bland appearing scarring of the lung bases. No evidence of fibrotic interstitial lung disease.  3. Hepatic steatosis. Coarse contour of the liver, suggestive of cirrhosis. 4. Subacute, partially callused fracture of the anterior right fifth rib.   She had been on cefdnir prior to that scan but I am not sure what antibacterial antibiotic she received after that.   CT in March 2025 showed:    IMPRESSION: 1. Interval resolution of airspace consolidation within the basilar right upper lobe. In the  area of the previous consolidative change there is a cluster of lung nodules which measure up to 5 mm. The clustered appearance of these nodules strongly suggest sequelae of an inflammatory or infectious process. Most likely chronic atypical infection such as MAI 2. Interval resolution of clustered nodularity within the anteromedial left upper lobe. 3. Stable appearance of peripheral bronchiolectasis with peripheral clustered tree-in-bud nodularity within the right middle lobe and posterior lingula. Findings are also favored to represent sequelae of chronic atypical mycobacterial infection. 4. Stable 5 mm peripheral subpleural nodule in the posterolateral right middle lobe. 5. Coronary artery calcifications noted. 6.  Aortic Atherosclerosis (ICD10-I70.0).   She underwent bronchoscopy with BAL with Dr. Kara on [phone]   He found    - Scant thick clear mucous scattered throughout both bronchial trees - Possible excessive dynamic airway collapse - Mild erythema of the RML and Lingula entrances - No endobronchial lesions noted   BAL were sent for cultures and on fungal cultures from 2 BAL specimens were Aspergillus fumigatus was isolated.     She more recently was rx ciprofloxacin  and flagyl  and later augmentin, in this case for possible parotitis/sinusitis with worsening of her frequency of loose stools. Her baseline is to have about 8 loose bowel movements per day.SABRA She was seen by Dr. Albertus who sent C difficile PCR on stool that was + though I dont see that a C difficile toxin was sent. She has now been placed on vancomycin  orally.Her symptoms have not improved on vancomycin .   I am skeptical that C difficile is driving pathology in her. I would think that the augmentin more likely just in itself caused worsening of her chronic diarrhea rather than causing c difficile colitis.   As of her weight she had success with Ozempic  and Mounjaro  but then when she was no longer diabetic the  medication was stopped and she regained her weight she is about to go on a new drug to help with this and she certainly has multiple indications for need for weight loss drug including NASH, obstructive sleep apnea process possible obesity hypoventilation syndrome and diabetes.   At last visit she was experiencing progressive dyspena now needing O2 at times.   She had been taken off of amiodarone .  I discussed her case with Dr Kara who was more spacious of cardiogenic cause.  Indeed the patient was found on cardiac catheterization to have coronary disease and not being followed by heart failure team.  Her DOE has improved with diuretics, optimization of her CHF including CPAP for OSA though she has had apparent allergic reactions to the different mask that she has to wear at night which is making her using this problematic  I noticed cipro  on her med list which would seem odd to be a chronic medicine if one were worried about recurrent C diff risk.  I would advise she come off of this if she is on this daily as it is super high risk antibiotics for C difficile.  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   Carpal tunnel syndrome    Cervical radiculitis    Chronic diarrhea 10/24/2023   Collar bone fracture    Depression    Dermatomyositis (HCC)    Diabetes mellitus without complication (HCC)    Dysrhythmia    Elevated liver enzymes    External hemorrhoids    Fatty liver    Fibromyalgia    Fibromyalgia affecting hand 08/2022   bilateral wrist and hands   Hematuria    negative evaluation   High cholesterol    HTN (hypertension)    Internal hemorrhoids    Laryngitis    Lower back pain    Macular degeneration    Nonalcoholic steatohepatitis (NASH)    Nonalcoholic steatohepatitis (NASH)    Osteopenia    Left side hip   Seizures (HCC)     09/07/2019 pt reports last seizure in 2014   Tubular adenoma of colon     Past Surgical History:  Procedure Laterality Date   ABDOMINAL HYSTERECTOMY  1982   ADENOIDECTOMY     APPENDECTOMY     ATRIAL FIBRILLATION ABLATION N/A 03/13/2023   Procedure: ATRIAL FIBRILLATION ABLATION;  Surgeon: Inocencio Soyla Lunger, MD;  Location: [facility];  Service: Cardiovascular;  Laterality: N/A;   BACK SURGERY     BACK SURGERY  07/2015   BRONCHIAL WASHINGS  09/19/2023   Procedure: IRRIGATION, BRONCHUS;  Surgeon: Kara Dorn NOVAK, MD;  Location: [facility];  Service: Pulmonary;;   CARPAL TUNNEL RELEASE     CATARACT EXTRACTION, BILATERAL     CHOLECYSTECTOMY  2003   COLONOSCOPY     EXPLORATORY LAPAROTOMY     FLEXIBLE BRONCHOSCOPY Bilateral 09/19/2023   Procedure: BRONCHOSCOPY, FLEXIBLE;  Surgeon: Kara Dorn NOVAK, MD;  Location: [facility] ENDOSCOPY;  Service: Pulmonary;  Laterality: Bilateral;  bronchoscopy for BAL   LAMINECTOMY     RIGHT HEART CATH N/A 11/27/2023   Procedure: RIGHT HEART CATH;  Surgeon: Cherrie Toribio SAUNDERS, MD;  Location: [facility];  Service: Cardiovascular;  Laterality: N/A;   SHOULDER SURGERY Right 2000   SINOSCOPY     TONSILLECTOMY     TOTAL HIP ARTHROPLASTY Right 02/12/2022   Procedure: RIGHT TOTAL HIP ARTHROPLASTY ANTERIOR APPROACH;  Surgeon: Vernetta Lonni GRADE, MD;  Location: [facility] OR;  Service: Orthopedics;  Laterality: Right;    Family History  Problem Relation Age of Onset   Cancer Mother        Cancer of the bone marrow   Heart attack Mother    Stroke Sister    Hypertension Sister    Diabetes Sister    Colon cancer Maternal Aunt    Seizures Neg Hx    Esophageal cancer Neg Hx    Rectal cancer Neg Hx    Stomach cancer Neg Hx    Breast cancer Neg Hx    BRCA 1/2 Neg Hx       Social History   Socioeconomic History   Marital status: Married    Spouse name: Not on file   Number of children: 2   Years of education: 14   Highest education  level: Not on file  Occupational History   Occupation: Employed    Comment: Works for [employer], [place] and [employer]    Comment: Retired  Tobacco Use   Smoking status: Former    Current packs/day: 1.50    Average packs/day: 1.5  packs/day for 45.5 years (68.3 ttl pk-yrs)    Types: Cigarettes    Start date: 07/01/1978   Smokeless tobacco: Never   Tobacco comments:    Former smoker 04/10/23  Vaping Use   Vaping status: Never Used  Substance and Sexual Activity   Alcohol  use: Yes    Alcohol /week: 7.0 standard drinks of alcohol     Types: 7 Glasses of wine per week    Comment: 1 glass of white wine nightly 04/10/23   Drug use: No   Sexual activity: Yes    Partners: Male    Birth control/protection: Surgical    Comment: TAH  Other Topics Concern   Not on file  Social History Narrative   Patient lives at home with her husband [name]).   Retired.   Education two years of business college.   Right handed.   Caffeine     Social Drivers of Corporate investment banker Strain: Low Risk  (06/17/2018)   Received from [facility]   Overall Financial Resource Strain (CARDIA)    Difficulty of Paying Living Expenses: Not hard at all  Food Insecurity: No Food Insecurity (06/17/2018)   Received from [facility]   Hunger Vital Sign    Within the past 12 months, you worried that your food would run out before you got the money to buy more.: Never true    Within the past 12 months, the food you bought just didn't last and you didn't have money to get more.: Never true  Transportation Needs: No Transportation Needs (06/17/2018)   Received from [facility] - Transportation    In the past 12 months, has lack of transportation kept you from medical appointments or from getting medications?: No    Lack of Transportation (Non-Medical): No  Physical Activity: Not on file  Stress: No Stress Concern Present (06/17/2018)   Received  from [facility] - Occupational Stress Questionnaire    Feeling of Stress : Only a little  Social Connections: Unknown (06/17/2018)   Received from [facility]   Social Connection and Isolation Panel    Frequency of Communication with Friends and Family: Not on file    Frequency of Social Gatherings with Friends and Family: Not on file    Attends Religious Services: Not on file    Active Member of Clubs or Organizations: Not on file    Attends Banker Meetings: Not on file    Are you married, widowed, divorced, separated, never married, or living with a partner?: Married    Allergies  Allergen Reactions   Aspirin  Other (See Comments)    gi bleeding in 2005   Cefdinir Other (See Comments)    Other Reaction(s): hives, itching   Colesevelam Other (See Comments)    Other Reaction(s): has never tried it but would not be a good choice as all seizure meds would have to be taken at least four hours prior to the dose and with her regimen that just won't be possible.   Ezetimibe -Simvastatin Other (See Comments)    Other Reaction(s): elevated LFT   Penicillin G Benzathine Itching   Rosuvastatin     Other Reaction(s): skin rash and myalgia   Wound Dressing Adhesive Other (See Comments)   Latex Itching    Rash      Current Outpatient Medications:    acetaminophen  (TYLENOL ) 500 MG tablet, Take 500  mg by mouth every 6 (six) hours as needed for moderate pain (pain score 4-6) or mild pain (pain score 1-3)., Disp: , Rfl:    Alirocumab  (PRALUENT ) 150 MG/ML SOAJ, Inject 1 mL (150 mg total) into the skin every 14 (fourteen) days., Disp: 6 mL, Rfl: 3   amLODipine  (NORVASC ) 2.5 MG tablet, Take 1 tablet (2.5 mg total) by mouth at bedtime., Disp: 90 tablet, Rfl: 3   apixaban  (ELIQUIS ) 5 MG TABS tablet, Take 1 tablet (5 mg total) by mouth 2 (two) times daily., Disp: 180 tablet, Rfl: 1   Ascorbic Acid (VITAMIN C)  1000 MG tablet, Take 1,000 mg by mouth daily. Airborne emergen c, Disp: , Rfl:    beclomethasone (QVAR  REDIHALER) 80 MCG/ACT inhaler, Inhale 1 puff into the lungs 2 (two) times daily., Disp: 31.8 g, Rfl: 1   Calcium Carb-Cholecalciferol  (CALTRATE 600+D3 PO), Take 1 tablet by mouth daily., Disp: , Rfl:    Cholecalciferol  (VITAMIN D ) 50 MCG (2000 UT) tablet, Take 2,000 Units by mouth daily., Disp: , Rfl:    Coenzyme Q10 (CO Q 10 PO), Take 200 mg by mouth daily., Disp: , Rfl:    colestipol  (COLESTID ) 1 g tablet, TAKE 2 TABLETS(2 GRAMS) BY MOUTH AT BEDTIME, Disp: 180 tablet, Rfl: 0   cyclobenzaprine  (FLEXERIL ) 5 MG tablet, Take 1 tablet (5 mg total) by mouth every 8 (eight) hours as needed., Disp: 30 tablet, Rfl: 1   diphenoxylate -atropine  (LOMOTIL ) 2.5-0.025 MG tablet, Take 1-2 tablets by mouth every 4 hours as needed for diarrhea/loose stools, Disp: 90 tablet, Rfl: 1   DULoxetine  (CYMBALTA ) 30 MG capsule, Take 1 capsule (30 mg total) by mouth every evening., Disp: 90 capsule, Rfl: 4   ezetimibe  (ZETIA ) 10 MG tablet, Take 10 mg by mouth every evening., Disp: , Rfl:    furosemide  (LASIX ) 40 MG tablet, Take 2 tablets (80 mg total) by mouth daily., Disp: 90 tablet, Rfl: 3   gabapentin  (NEURONTIN ) 800 MG tablet, Take 1 tablet (800 mg total) by mouth 2 (two) times daily., Disp: 180 tablet, Rfl: 3   ibandronate (BONIVA) 150 MG tablet, Take 150 mg by mouth every 30 (thirty) days., Disp: , Rfl:    ipratropium (ATROVENT ) 0.03 % nasal spray, Place 2 sprays into both nostrils every 12 (twelve) hours., Disp: 30 mL, Rfl: 4   ipratropium-albuterol  (DUONEB) 0.5-2.5 (3) MG/3ML SOLN, Take 3 mLs by nebulization 3 (three) times daily as needed., Disp: 360 mL, Rfl: 5   loratadine  (CLARITIN ) 10 MG tablet, Take 1 tablet (10 mg total) by mouth daily as needed for allergies (Can take an extra dose during flare ups.)., Disp: 90 tablet, Rfl: 1   metoprolol  tartrate (LOPRESSOR ) 50 MG tablet, Take 1 tablet (50 mg total) by mouth 2  (two) times daily., Disp: , Rfl:    mometasone  (ASMANEX ) 220 MCG/ACT inhaler, Inhale 1 puff into the lungs 2 (two) times daily., Disp: 3 each, Rfl: 1   Multiple Vitamins-Minerals (MULTIVITAMIN PO), Take 1 tablet by mouth daily., Disp: , Rfl:    Multiple Vitamins-Minerals (PRESERVISION AREDS PO), Take 1 capsule by mouth daily., Disp: , Rfl:    Olopatadine-Mometasone  (RYALTRIS ) 665-25 MCG/ACT SUSP, Place 2 sprays into the nose 2 (two) times daily., Disp: 87 g, Rfl: 1   omeprazole (PRILOSEC) 20 MG capsule, Take 20 mg by mouth 2 (two) times daily before a meal., Disp: , Rfl: 3   PHENobarbital  (LUMINAL) 97.2 MG tablet, Take 1.5 tablets (145.8 mg total) by mouth daily., Disp: 135 tablet, Rfl: 2  potassium chloride  SA (KLOR-CON  M) 20 MEQ tablet, While taking 40 mg of Furosemide , take Potassium Chloride  20 meq twice a day. When you decrease Furosemide  to 20 mg, decrease Potassium Chloride  to 20 meq once a day., Disp: 180 tablet, Rfl: 3   PRALUENT  150 MG/ML SOAJ, Inject 150 mg into the skin every 14 (fourteen) days., Disp: , Rfl:    pyridoxine  (B-6) 100 MG tablet, Take 100 mg by mouth daily., Disp: , Rfl:    sacubitril-valsartan (ENTRESTO ) 49-51 MG, Take 1 tablet by mouth 2 (two) times daily., Disp: 60 tablet, Rfl: 1   topiramate  (TOPAMAX ) 50 MG tablet, TAKE 1 TABLET BY MOUTH IN THE MORNING AND 2 TABLETS BY MOUTH EVERY EVENING, Disp: 90 tablet, Rfl: 0   vitamin E 400 UNIT capsule, Take 400 Units by mouth daily., Disp: , Rfl:    ZEPBOUND  2.5 MG/0.5ML Pen, Inject 2.5 mg into the skin once a week. (Patient taking differently: Inject 7.5 mg into the skin once a week.), Disp: , Rfl:     Review of Systems  Constitutional:  Negative for activity change, appetite change, chills, diaphoresis, fatigue, fever and unexpected weight change.  HENT:  Negative for congestion, rhinorrhea, sinus pressure, sneezing, sore throat and trouble swallowing.   Eyes:  Negative for photophobia and visual disturbance.  Respiratory:   Positive for shortness of breath. Negative for cough, chest tightness, wheezing and stridor.   Cardiovascular:  Negative for chest pain, palpitations and leg swelling.  Gastrointestinal:  Negative for abdominal distention, abdominal pain, anal bleeding, blood in stool, constipation, diarrhea, nausea and vomiting.  Genitourinary:  Negative for difficulty urinating, dysuria, flank pain and hematuria.  Musculoskeletal:  Negative for arthralgias, back pain, gait problem, joint swelling and myalgias.  Skin:  Negative for color change, pallor, rash and wound.  Neurological:  Negative for dizziness, tremors, weakness and light-headedness.  Hematological:  Negative for adenopathy. Does not bruise/bleed easily.  Psychiatric/Behavioral:  Negative for agitation, behavioral problems, confusion, decreased concentration, dysphoric mood and sleep disturbance.        Objective:   Physical Exam Constitutional:      General: She is not in acute distress.    Appearance: She is not diaphoretic.  HENT:     Head: Normocephalic and atraumatic.     Right Ear: External ear normal.     Left Ear: External ear normal.     Nose: Nose normal.     Mouth/Throat:     Pharynx: No oropharyngeal exudate.  Eyes:     General: No scleral icterus.       Right eye: No discharge.        Left eye: No discharge.     Extraocular Movements: Extraocular movements intact.     Conjunctiva/sclera: Conjunctivae normal.  Cardiovascular:     Rate and Rhythm: Normal rate and regular rhythm.  Pulmonary:     Effort: Pulmonary effort is normal. No respiratory distress.     Breath sounds: No wheezing.  Abdominal:     General: There is no distension.     Palpations: Abdomen is soft.  Musculoskeletal:        General: No tenderness. Normal range of motion.     Cervical back: Normal range of motion and neck supple.  Lymphadenopathy:     Cervical: No cervical adenopathy.  Skin:    General: Skin is warm and dry.     Coloration: Skin  is not jaundiced or pale.     Findings: No erythema, lesion or rash.  Neurological:     General: No focal deficit present.     Mental Status: She is alert and oriented to person, place, and time.     Coordination: Coordination normal.  Psychiatric:        Mood and Affect: Mood normal.        Behavior: Behavior normal.        Thought Content: Thought content normal.        Judgment: Judgment normal.           Assessment & Plan:   Dyspnea on exertion: Seems to be largely cardiogenic in nature with improvement as heart failure has been treated as sleep apnea as well  Obstructive sleep apnea she gets great benefit from her sleep machine but she has allergic reactions to the plastic whether it be coming open the nose or around the lips.  She is having to wear make-up to cover up the rashes that she developed she also has had fits of sneezing related to wearing the mask as well.  I wonder if she would benefit from an INSPIRE device   Hx of possible  C difficile: I was never convinced that we proved she had this but if this is concern since she is minimum colonized with C. difficile it would be best for her to avoid high risk antibiotics such as fluoroquinolones clindamycin third-generation cephalosporins and proton pump inhibitors if she really is on ciprofloxacin  and omeprazole I would recommend coming off the ciprofloxacin  and potentially tapering off omeprazole by using a H2 blocker.    Aspergillus isolated in the lungs: Again I do not think this is a pathogen but rather a colonizer  Dermatomyositis currently having immunosuppressive drugs held   I have personally spent 26 minutes involved in face-to-face and non-face-to-face activities for this patient on the day of the visit. Professional time spent includes the following activities: Preparing to see the patient (review of tests), Obtaining and/or reviewing separately obtained history (admission/discharge record), Performing a  medically appropriate examination and/or evaluation , Ordering medications/tests/procedures, referring and communicating with other health care professionals, Documenting clinical information in the EMR, Independently interpreting results (not separately reported), Communicating results to the patient/family/caregiver, Counseling and educating the patient/family/caregiver and Care coordination (not separately reported).

## 2024-01-12 ENCOUNTER — Ambulatory Visit (INDEPENDENT_AMBULATORY_CARE_PROVIDER_SITE_OTHER): Admitting: Adult Health

## 2024-01-12 ENCOUNTER — Other Ambulatory Visit: Payer: Self-pay

## 2024-01-12 ENCOUNTER — Ambulatory Visit (INDEPENDENT_AMBULATORY_CARE_PROVIDER_SITE_OTHER): Payer: Self-pay | Admitting: Infectious Disease

## 2024-01-12 ENCOUNTER — Encounter: Payer: Self-pay | Admitting: Adult Health

## 2024-01-12 VITALS — Wt 159.6 lb

## 2024-01-12 VITALS — BP 105/55 | HR 72 | Ht <= 58 in | Wt 160.0 lb

## 2024-01-12 DIAGNOSIS — G4733 Obstructive sleep apnea (adult) (pediatric): Secondary | ICD-10-CM

## 2024-01-12 DIAGNOSIS — M3313 Other dermatomyositis without myopathy: Secondary | ICD-10-CM | POA: Diagnosis not present

## 2024-01-12 DIAGNOSIS — I509 Heart failure, unspecified: Secondary | ICD-10-CM | POA: Diagnosis not present

## 2024-01-12 DIAGNOSIS — R569 Unspecified convulsions: Secondary | ICD-10-CM | POA: Diagnosis not present

## 2024-01-12 DIAGNOSIS — B369 Superficial mycosis, unspecified: Secondary | ICD-10-CM | POA: Diagnosis not present

## 2024-01-12 DIAGNOSIS — Z8619 Personal history of other infectious and parasitic diseases: Secondary | ICD-10-CM | POA: Diagnosis not present

## 2024-01-12 DIAGNOSIS — B4489 Other forms of aspergillosis: Secondary | ICD-10-CM

## 2024-01-12 DIAGNOSIS — K2100 Gastro-esophageal reflux disease with esophagitis, without bleeding: Secondary | ICD-10-CM

## 2024-01-12 NOTE — Progress Notes (Signed)
 PATIENT: Amber Bennett DOB: [birth year]  REASON FOR VISIT: follow up HISTORY FROM: patient  Primary neurologist:  Dr. Gregg  Chief Complaint  Patient presents with   Follow-up    Follow up for Convulsions/seizures,   no know concerns, she is needing refill on medication .      HISTORY OF PRESENT ILLNESS: Today 01/12/24:  Amber Bennett is a 73 y.o. female with a history of seizures. Returns today for follow-up.  She denies any seizure events.  Overall she states that she is doing well.  Remains on Topamax , gabapentin  and phenobarbital .  Reports that she is tolerating the medication well.  No change in gait or balance.  No change in mood or behavior.  Denies any dizziness or drowsiness.  She states that she is now in pulmonary rehab for 12 weeks due to heart failure.  Returns today for an evaluation.   12/11/22 Amber Bennett is a 73 y.o. female with a history of Seizures. Returns today for follow-up. Reports that she is doing well. No seizure events. Remains on Topamax , Gabapentin  and Phenobarbital . History of  low back pain. Dr. Joshua assistant gave her an injection and it was helpful. Had hip surgery 01/2022 and that has also helped pain.  No change in gait or balance. No falls. Tolerates dose well with no SE.   Today her heart rate and blood pressure is elevated.  Her spouse states that while she was at home sitting on the couch recently her watch alerted her that her heart rate was up. She doesn't feel SOB or lightheaded.    12/20/21: Amber Bennett is a 73 year old female with a history of seizures.  She returns today for follow-up.  Overall she feels that she is doing well.  Denies any seizure events.  Continues on Topamax  and phenobarbital .  She operates a Librarian, academic without difficulty.  Able to complete all ADLs independently.  She has chronic history of low back pain.  Continues on gabapentin  and Cymbalta .  Reports that this continues to work well for her.  Have found that she  had two tears in the right hip which could be the cause of some of back pain. 8/15 will have a total hip replacement.    02/07/21 Amber Bennett is a 73 year old female with a history of seizures. She returns today for follow-up. She remains on topmax and phenobarbital . Denies any siezure events. Operates a motor vehicle without difficulty. Completes all ADLS independently. Continues on gabapentin  and Cymbalta  for back pain. Returns today for follow-up.   02/02/20: Amber Bennett is a 73 year old female with a history of seizures.  She returns today for follow-up.  She remains on Topamax  and phenobarbital .  Denies any seizure events.  No changes with her gait or balance.  She also has a history of low back pain.  She continues to take gabapentin  and Cymbalta .  Reports that this continues to work well for her.  She returns today for an evaluation.  HISTORY Amber Bennett is a 73 year old right-handed white female with history of seizures that have been well controlled on Topamax  and phenobarbital .  The patient has not had any seizures since last seen.  She does have a history of low back pain that has worsened slightly as she is packing boxes to move.  She takes gabapentin  as well for her low back and is on Cymbalta .  She returns to this office for an evaluation.  On an annual basis, her primary care  physician checks the phenobarbital  level.  The last level 1 year ago was 23  REVIEW OF SYSTEMS: Out of a complete 14 system review of symptoms, the patient complains only of the following symptoms, and all other reviewed systems are negative.  See HPI  ALLERGIES: Allergies  Allergen Reactions   Aspirin  Other (See Comments)    gi bleeding in 2005   Cefdinir Other (See Comments)    Other Reaction(s): hives, itching   Colesevelam Other (See Comments)    Other Reaction(s): has never tried it but would not be a good choice as all seizure meds would have to be taken at least four hours prior to the dose and with her regimen  that just won't be possible.   Ezetimibe -Simvastatin Other (See Comments)    Other Reaction(s): elevated LFT   Penicillin G Benzathine Itching   Rosuvastatin     Other Reaction(s): skin rash and myalgia   Wound Dressing Adhesive Other (See Comments)   Latex Itching    Rash     HOME MEDICATIONS: Outpatient Medications Prior to Visit  Medication Sig Dispense Refill   acetaminophen  (TYLENOL ) 500 MG tablet Take 500 mg by mouth every 6 (six) hours as needed for moderate pain (pain score 4-6) or mild pain (pain score 1-3).     Alirocumab  (PRALUENT ) 150 MG/ML SOAJ Inject 1 mL (150 mg total) into the skin every 14 (fourteen) days. 6 mL 3   amLODipine  (NORVASC ) 2.5 MG tablet Take 1 tablet (2.5 mg total) by mouth at bedtime. 90 tablet 3   apixaban  (ELIQUIS ) 5 MG TABS tablet Take 1 tablet (5 mg total) by mouth 2 (two) times daily. 180 tablet 1   Ascorbic Acid (VITAMIN C) 1000 MG tablet Take 1,000 mg by mouth daily. Airborne emergen c     beclomethasone (QVAR  REDIHALER) 80 MCG/ACT inhaler Inhale 1 puff into the lungs 2 (two) times daily. 31.8 g 1   Calcium Carb-Cholecalciferol  (CALTRATE 600+D3 PO) Take 1 tablet by mouth daily.     Cholecalciferol  (VITAMIN D ) 50 MCG (2000 UT) tablet Take 2,000 Units by mouth daily.     Coenzyme Q10 (CO Q 10 PO) Take 200 mg by mouth daily.     colestipol  (COLESTID ) 1 g tablet TAKE 2 TABLETS(2 GRAMS) BY MOUTH AT BEDTIME 180 tablet 0   cyclobenzaprine  (FLEXERIL ) 5 MG tablet Take 1 tablet (5 mg total) by mouth every 8 (eight) hours as needed. 30 tablet 1   diphenoxylate -atropine  (LOMOTIL ) 2.5-0.025 MG tablet Take 1-2 tablets by mouth every 4 hours as needed for diarrhea/loose stools 90 tablet 1   DULoxetine  (CYMBALTA ) 30 MG capsule Take 1 capsule (30 mg total) by mouth every evening. 90 capsule 4   ezetimibe  (ZETIA ) 10 MG tablet Take 10 mg by mouth every evening.     furosemide  (LASIX ) 40 MG tablet Take 2 tablets (80 mg total) by mouth daily. 90 tablet 3   gabapentin   (NEURONTIN ) 800 MG tablet Take 1 tablet (800 mg total) by mouth 2 (two) times daily. 180 tablet 3   ibandronate (BONIVA) 150 MG tablet Take 150 mg by mouth every 30 (thirty) days.     ipratropium (ATROVENT ) 0.03 % nasal spray Place 2 sprays into both nostrils every 12 (twelve) hours. 30 mL 4   ipratropium-albuterol  (DUONEB) 0.5-2.5 (3) MG/3ML SOLN Take 3 mLs by nebulization 3 (three) times daily as needed. 360 mL 5   loratadine  (CLARITIN ) 10 MG tablet Take 1 tablet (10 mg total) by mouth daily as needed  for allergies (Can take an extra dose during flare ups.). 90 tablet 1   metoprolol  tartrate (LOPRESSOR ) 50 MG tablet Take 1 tablet (50 mg total) by mouth 2 (two) times daily.     mometasone  (ASMANEX ) 220 MCG/ACT inhaler Inhale 1 puff into the lungs 2 (two) times daily. 3 each 1   Multiple Vitamins-Minerals (MULTIVITAMIN PO) Take 1 tablet by mouth daily.     Multiple Vitamins-Minerals (PRESERVISION AREDS PO) Take 1 capsule by mouth daily.     Olopatadine-Mometasone  (RYALTRIS ) 665-25 MCG/ACT SUSP Place 2 sprays into the nose 2 (two) times daily. 87 g 1   omeprazole (PRILOSEC) 20 MG capsule Take 20 mg by mouth 2 (two) times daily before a meal.  3   PHENobarbital  (LUMINAL) 97.2 MG tablet Take 1.5 tablets (145.8 mg total) by mouth daily. 135 tablet 2   potassium chloride  SA (KLOR-CON  M) 20 MEQ tablet While taking 40 mg of Furosemide , take Potassium Chloride  20 meq twice a day. When you decrease Furosemide  to 20 mg, decrease Potassium Chloride  to 20 meq once a day. 180 tablet 3   PRALUENT  150 MG/ML SOAJ Inject 150 mg into the skin every 14 (fourteen) days.     pyridoxine  (B-6) 100 MG tablet Take 100 mg by mouth daily.     sacubitril-valsartan (ENTRESTO ) 49-51 MG Take 1 tablet by mouth 2 (two) times daily. 60 tablet 1   topiramate  (TOPAMAX ) 50 MG tablet TAKE 1 TABLET BY MOUTH IN THE MORNING AND 2 TABLETS BY MOUTH EVERY EVENING 90 tablet 0   vitamin E 400 UNIT capsule Take 400 Units by mouth daily.      ZEPBOUND  2.5 MG/0.5ML Pen Inject 2.5 mg into the skin once a week. 7.5 mg she is taking     No facility-administered medications prior to visit.    PAST MEDICAL HISTORY: 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   Carpal tunnel syndrome    Cervical radiculitis    Chronic diarrhea 10/24/2023   Collar bone fracture    Depression    Dermatomyositis (HCC)    Diabetes mellitus without complication (HCC)    Dysrhythmia    Elevated liver enzymes    External hemorrhoids    Fatty liver    Fibromyalgia    Fibromyalgia affecting hand 08/2022   bilateral wrist and hands   Hematuria    negative evaluation   High cholesterol    HTN (hypertension)    Internal hemorrhoids    Laryngitis    Lower back pain    Macular degeneration    Nonalcoholic steatohepatitis (NASH)    Nonalcoholic steatohepatitis (NASH)    Osteopenia    Left side hip   Seizures (HCC)    09/07/2019 pt reports last seizure in 2014   Tubular adenoma of colon     PAST SURGICAL HISTORY: Past Surgical History:  Procedure Laterality Date   ABDOMINAL HYSTERECTOMY  1982   ADENOIDECTOMY     APPENDECTOMY     ATRIAL FIBRILLATION ABLATION N/A 03/13/2023   Procedure: ATRIAL FIBRILLATION ABLATION;  Surgeon: Inocencio Soyla Lunger, MD;  [facility];  Service: Cardiovascular;  Laterality: N/A;   BACK SURGERY     BACK SURGERY  07/2015   BRONCHIAL WASHINGS  09/19/2023   Procedure: IRRIGATION, BRONCHUS;  Surgeon: Kara Dorn NOVAK, MD;  Location:  [facility];  Service: Pulmonary;;   CARPAL TUNNEL RELEASE     CATARACT EXTRACTION, BILATERAL     CHOLECYSTECTOMY  2003   COLONOSCOPY     EXPLORATORY LAPAROTOMY     FLEXIBLE BRONCHOSCOPY Bilateral 09/19/2023   Procedure: BRONCHOSCOPY, FLEXIBLE;  Surgeon: Kara Dorn NOVAK, MD;  Location: [facility] ENDOSCOPY;  Service: Pulmonary;   Laterality: Bilateral;  bronchoscopy for BAL   LAMINECTOMY     RIGHT HEART CATH N/A 11/27/2023   Procedure: RIGHT HEART CATH;  Surgeon: Cherrie Toribio SAUNDERS, MD;  Location: [facility];  Service: Cardiovascular;  Laterality: N/A;   SHOULDER SURGERY Right 2000   SINOSCOPY     TONSILLECTOMY     TOTAL HIP ARTHROPLASTY Right 02/12/2022   Procedure: RIGHT TOTAL HIP ARTHROPLASTY ANTERIOR APPROACH;  Surgeon: Vernetta Lonni GRADE, MD;  Location: [facility] OR;  Service: Orthopedics;  Laterality: Right;    FAMILY HISTORY: Family History  Problem Relation Age of Onset   Cancer Mother        Cancer of the bone marrow   Heart attack Mother    Stroke Sister    Hypertension Sister    Diabetes Sister    Colon cancer Maternal Aunt    Seizures Neg Hx    Esophageal cancer Neg Hx    Rectal cancer Neg Hx    Stomach cancer Neg Hx    Breast cancer Neg Hx    BRCA 1/2 Neg Hx     SOCIAL HISTORY: Social History   Socioeconomic History   Marital status: Married    Spouse name: Not on file   Number of children: 2   Years of education: 14   Highest education level: Not on file  Occupational History   Occupation: Employed    Comment: Works for [employer], [employer] and [employer]    Comment: Retired  Tobacco Use   Smoking status: Former    Current packs/day: 1.50    Average packs/day: 1.5 packs/day for 45.5 years (68.3 ttl pk-yrs)    Types: Cigarettes    Start date: 07/01/1978   Smokeless tobacco: Never   Tobacco comments:    Former smoker 04/10/23  Vaping Use   Vaping status: Never Used  Substance and Sexual Activity   Alcohol  use: Yes    Alcohol /week: 7.0 standard drinks of alcohol     Types: 7 Glasses of wine per week    Comment: 1 glass of white wine nightly 04/10/23   Drug use: No   Sexual activity: Yes    Partners: Male    Birth control/protection: Surgical    Comment: TAH  Other Topics Concern   Not on file  Social History Narrative   Patient lives at home with her husband  [name]).   Retired.   Education two years of business college.   Right handed.   Caffeine     Social Drivers of Corporate investment banker Strain: Low Risk  (06/17/2018)   Received from [facility]   Overall Financial Resource Strain (CARDIA)    Difficulty of Paying Living Expenses: Not hard at all  Food Insecurity: No Food Insecurity (06/17/2018)   [facility]   Hunger Vital Sign    Within the past 12 months, you worried that your food would run out before you got the money to buy more.: Never true    Within the past 12 months, the food you bought just didn't last and you didn't have money to get more.: Never true  Transportation Needs: No Transportation Needs (06/17/2018)   Received from [facility] - Transportation    In the past 12 months, has lack of transportation kept you from medical appointments or from getting medications?: No    Lack of Transportation (Non-Medical): No  Physical Activity: Not on file  Stress: No Stress Concern Present (06/17/2018)   Received from [facility] - Occupational Stress Questionnaire    Feeling of Stress : Only a little  Social Connections: Unknown (06/17/2018)   Received from [facility]   Social Connection and Isolation Panel    Frequency of Communication with Friends and Family: Not on file    Frequency of Social Gatherings with Friends and Family: Not on file    Attends Religious Services: Not on file    Active Member of Clubs or Organizations: Not on file    Attends Banker Meetings: Not on file    Are you married, widowed, divorced, separated, never married, or living with a partner?: Married  Intimate Partner Violence: Not on file      PHYSICAL EXAM  Vitals:   01/12/24 1139  BP: (!) 105/55  Pulse: 72  Weight: 160 lb (72.6 kg)  Height: 4' 10 (1.473 m)      Body mass index is 33.44 kg/m.  Generalized: Well developed, in no acute distress   Neurological examination  Mentation: Alert oriented to time, place, history taking. Follows all commands speech and language fluent Cranial nerve II-XII: Pupils were equal round reactive to light. Extraocular movements were full, visual field were full on confrontational test. Head turning and shoulder shrug  were normal and symmetric. Motor: The motor testing reveals 5 over 5 strength of all 4 extremities. Good symmetric motor tone is noted throughout.  Sensory: Sensory testing is intact to soft touch on all 4 extremities. No evidence of extinction is noted.  Coordination: Cerebellar testing reveals good finger-nose-finger and heel-to-shin bilaterally.  Gait and station: Gait is normal.    DIAGNOSTIC DATA (LABS, IMAGING, TESTING) - I reviewed patient records, labs, notes, testing and imaging myself where available.     ASSESSMENT AND PLAN 73 y.o. year old female  has a past medical history of Anemia, Angio-edema, Arthritis, Aspergillus fumigatus (HCC) (10/23/2023), Atherosclerosis of aorta (HCC), Bertolotti's syndrome, Carpal tunnel syndrome, Cervical radiculitis, Chronic diarrhea (10/24/2023), Collar bone fracture, Depression, Dermatomyositis (HCC), Diabetes mellitus without complication (HCC), Dysrhythmia, Elevated liver enzymes, External hemorrhoids, Fatty liver, Fibromyalgia, Fibromyalgia affecting hand (08/2022), Hematuria, High cholesterol, HTN (hypertension), Internal hemorrhoids, Laryngitis, Lower back pain, Macular degeneration, Nonalcoholic steatohepatitis (NASH), Nonalcoholic steatohepatitis (NASH), Osteopenia, Seizures (HCC), and Tubular adenoma of colon. here with:  Seizures  Continue phenobarbital  97.2 mg 1 1/2 tablets daily  Continue Topamax  50 mg in the AM, 100 mg in the PM Continue 800 mg BID Levels are checked by her PCP    Duwaine Russell, MSN, NP-C 01/12/2024, 11:49  AM [facility] [address], [address] [place], [place] [address] [phone]  The patient's condition requires frequent monitoring and adjustments in the treatment plan, reflecting the ongoing complexity of care.  This provider is the continuing focal point for all needed services for this condition.

## 2024-01-13 ENCOUNTER — Encounter (HOSPITAL_COMMUNITY)
Admission: RE | Admit: 2024-01-13 | Discharge: 2024-01-13 | Disposition: A | Discharge status: Home / Self Care | Source: Ambulatory Visit | Attending: Pulmonary Disease

## 2024-01-13 ENCOUNTER — Other Ambulatory Visit: Payer: Self-pay

## 2024-01-13 DIAGNOSIS — I272 Pulmonary hypertension, unspecified: Secondary | ICD-10-CM

## 2024-01-13 DIAGNOSIS — J984 Other disorders of lung: Secondary | ICD-10-CM

## 2024-01-13 MED ORDER — FUROSEMIDE 40 MG PO TABS: 80.0000 mg | ORAL_TABLET | Freq: Every day | ORAL | 3 refills | Status: DC

## 2024-01-13 NOTE — Progress Notes (Signed)
 Daily Session Note  Patient Details  Name: Amber Bennett MRN: [id number] Date of Birth: 1950/08/22 Referring Provider:   Conrad Ports Pulmonary Rehab Walk Test from 12/19/2023 in [facility]  Referring Provider Dr. Lonni Nanas    Encounter Date: 01/13/2024  Check In:  Session Check In - 01/13/24 1345       Check-In   Physician(s) Rosabel Mose, NP    Location [facility]    Staff Present Johnnie Moats, MS, ACSM-CEP, Exercise Physiologist;Mary Harvy, RN, BSN;Fabiola Mudgett Claudene, RT;Samantha Belarus, RD, LDN;Randi [facility], ACSM-CEP, Exercise Physiologist    Virtual Visit No    Medication changes reported     No    Fall or balance concerns reported    No    Tobacco Cessation No Change    Warm-up and Cool-down Performed as group-led instruction    Resistance Training Performed Yes    VAD Patient? No    PAD/SET Patient? No      Pain Assessment   Currently in Pain? No/denies    Multiple Pain Sites No          Capillary Blood Glucose: No results found for this or any previous visit (from the past 24 hours).    Social History   Tobacco Use  Smoking Status Former   Current packs/day: 1.50   Average packs/day: 1.5 packs/day for 45.5 years (68.3 ttl pk-yrs)   Types: Cigarettes   Start date: 07/01/1978  Smokeless Tobacco Never  Tobacco Comments   Former smoker 04/10/23    Goals Met:  Proper associated with RPD/PD & O2 Sat Independence with exercise equipment Exercise tolerated well No report of concerns or symptoms today Strength training completed today  Goals Unmet:  Not Applicable  Comments: Service time is from 1316 to 1449.    Dr. Slater Staff is Medical Director for Pulmonary Rehab at [facility]

## 2024-01-14 ENCOUNTER — Other Ambulatory Visit: Payer: Self-pay | Admitting: Gastroenterology

## 2024-01-15 ENCOUNTER — Other Ambulatory Visit: Payer: Self-pay | Admitting: Adult Health

## 2024-01-20 ENCOUNTER — Encounter (HOSPITAL_COMMUNITY)
Admission: RE | Admit: 2024-01-20 | Discharge: 2024-01-20 | Discharge status: Home / Self Care | Source: Ambulatory Visit | Attending: Pulmonary Disease

## 2024-01-20 VITALS — Wt 161.8 lb

## 2024-01-20 DIAGNOSIS — I272 Pulmonary hypertension, unspecified: Secondary | ICD-10-CM

## 2024-01-20 DIAGNOSIS — J984 Other disorders of lung: Secondary | ICD-10-CM

## 2024-01-20 NOTE — Progress Notes (Signed)
 Daily Session Note  Patient Details  Name: SAMARA STANKOWSKI MRN: [id number] Date of Birth: 1951/01/18 Referring Provider:   Conrad Ports Pulmonary Rehab Walk Test from 12/19/2023 in [facility] for Heart, Vascular, & Lung Health  Referring Provider Dr. Lonni Nanas    Encounter Date: 01/20/2024  Check In:  Session Check In - 01/20/24 1500       Check-In   Supervising physician immediately available to respond to emergencies [facility] MD immediately available    Physician(s) Jo Skains, NP    Location [facility]    Staff Present Johnnie Moats, MS, ACSM-CEP, Exercise Physiologist;Malaquias Lenker Harvy, RN, BSN;Casey Smith, RT;Samantha Belarus, RD, LDN    Virtual Visit No    Medication changes reported     No    Fall or balance concerns reported    No    Tobacco Cessation No Change    Warm-up and Cool-down Performed as group-led instruction    Resistance Training Performed Yes    VAD Patient? No    PAD/SET Patient? No      Pain Assessment   Currently in Pain? No/denies    Multiple Pain Sites No          Capillary Blood Glucose: No results found for this or any previous visit (from the past 24 hours).   Exercise Prescription Changes - 01/20/24 1500       Response to Exercise   Blood Pressure (Admit) 128/54    Blood Pressure (Exercise) 116/64    Blood Pressure (Exit) 122/68    Heart Rate (Admit) 89 bpm    Heart Rate (Exercise) 95 bpm    Heart Rate (Exit) 79 bpm    Oxygen Saturation (Admit) 97 %    Oxygen Saturation (Exercise) 95 %    Oxygen Saturation (Exit) 97 %    Rating of Perceived Exertion (Exercise) 13    Perceived Dyspnea (Exercise) 2    Duration Continue with 30 min of aerobic exercise without signs/symptoms of physical distress.    Intensity THRR unchanged      Progression   Progression Continue to progress workloads to maintain intensity without signs/symptoms of physical distress.      Resistance Training   Training  Prescription Yes    Weight red bands    Reps 10-15    Time 10 Minutes      NuStep   Level 2    Minutes 15    METs 2.9      Recumbant Elliptical   Level 2    Minutes 15    METs 3.7          Social History   Tobacco Use  Smoking Status Former   Current packs/day: 1.50   Average packs/day: 1.5 packs/day for 45.6 years (68.3 ttl pk-yrs)   Types: Cigarettes   Start date: 07/01/1978  Smokeless Tobacco Never  Tobacco Comments   Former smoker 04/10/23    Goals Met:  Independence with exercise equipment Exercise tolerated well No report of concerns or symptoms today Strength training completed today  Goals Unmet:  Not Applicable  Comments: Service time is from 1322 to 1446    Dr. Slater Staff is Medical Director for Pulmonary Rehab at [facility].

## 2024-01-22 ENCOUNTER — Encounter (HOSPITAL_COMMUNITY)
Admission: RE | Admit: 2024-01-22 | Discharge: 2024-01-22 | Disposition: A | Discharge status: Home / Self Care | Source: Ambulatory Visit | Attending: Pulmonary Disease

## 2024-01-22 DIAGNOSIS — I110 Hypertensive heart disease with heart failure: Secondary | ICD-10-CM | POA: Diagnosis not present

## 2024-01-22 DIAGNOSIS — J479 Bronchiectasis, uncomplicated: Secondary | ICD-10-CM | POA: Diagnosis not present

## 2024-01-22 DIAGNOSIS — D649 Anemia, unspecified: Secondary | ICD-10-CM | POA: Diagnosis not present

## 2024-01-22 DIAGNOSIS — K219 Gastro-esophageal reflux disease without esophagitis: Secondary | ICD-10-CM | POA: Diagnosis not present

## 2024-01-22 DIAGNOSIS — G4733 Obstructive sleep apnea (adult) (pediatric): Secondary | ICD-10-CM | POA: Diagnosis not present

## 2024-01-22 DIAGNOSIS — M810 Age-related osteoporosis without current pathological fracture: Secondary | ICD-10-CM | POA: Diagnosis not present

## 2024-01-22 DIAGNOSIS — I272 Pulmonary hypertension, unspecified: Secondary | ICD-10-CM | POA: Diagnosis not present

## 2024-01-22 DIAGNOSIS — R569 Unspecified convulsions: Secondary | ICD-10-CM | POA: Diagnosis not present

## 2024-01-22 DIAGNOSIS — I4892 Unspecified atrial flutter: Secondary | ICD-10-CM | POA: Diagnosis not present

## 2024-01-22 DIAGNOSIS — M3390 Dermatopolymyositis, unspecified, organ involvement unspecified: Secondary | ICD-10-CM | POA: Diagnosis not present

## 2024-01-22 DIAGNOSIS — E669 Obesity, unspecified: Secondary | ICD-10-CM | POA: Diagnosis not present

## 2024-01-22 DIAGNOSIS — I700 Atherosclerosis of aorta: Secondary | ICD-10-CM | POA: Diagnosis not present

## 2024-01-22 DIAGNOSIS — J984 Other disorders of lung: Secondary | ICD-10-CM | POA: Diagnosis not present

## 2024-01-22 DIAGNOSIS — J9611 Chronic respiratory failure with hypoxia: Secondary | ICD-10-CM | POA: Diagnosis not present

## 2024-01-22 DIAGNOSIS — I509 Heart failure, unspecified: Secondary | ICD-10-CM | POA: Diagnosis not present

## 2024-01-22 NOTE — Progress Notes (Signed)
 Daily Session Note  Patient Details  Name: Amber Bennett MRN: [id number] Date of Birth: Jul 29, 1950 Referring Provider:   Conrad Ports Pulmonary Rehab Walk Test from 12/19/2023 in [facility] for Heart, Vascular, & Lung Health  Referring Provider Dr. Lonni Nanas    Encounter Date: 01/22/2024  Check In:   Capillary Blood Glucose: No results found for this or any previous visit (from the past 24 hours).    Social History   Tobacco Use  Smoking Status Former   Current packs/day: 1.50   Average packs/day: 1.5 packs/day for 45.6 years (68.3 ttl pk-yrs)   Types: Cigarettes   Start date: 07/01/1978  Smokeless Tobacco Never  Tobacco Comments   Former smoker 04/10/23    Goals Met:  Exercise tolerated well Queuing for purse lip breathing No report of concerns or symptoms today Strength training completed today  Goals Unmet:  Left class early  Comments: Service time is from 1328 to 1422    Dr. Slater Staff is Medical Director for Pulmonary Rehab at [facility].

## 2024-01-23 ENCOUNTER — Ambulatory Visit: Admitting: Cardiovascular Disease

## 2024-01-23 ENCOUNTER — Other Ambulatory Visit (HOSPITAL_COMMUNITY): Payer: Self-pay | Admitting: Internal Medicine

## 2024-01-26 NOTE — Telephone Encounter (Signed)
 Pt called me and left a vm that she will need to resch her f/u in August with Dr. Kara. She stated she will be on a family vacation during that time. I called her and left a vm for her availability.  Could you all resch her for a time that works for her schedule? thanks

## 2024-01-26 NOTE — Telephone Encounter (Signed)
 PT has been rescheduled

## 2024-01-27 ENCOUNTER — Encounter (HOSPITAL_COMMUNITY)
Admission: RE | Admit: 2024-01-27 | Discharge: 2024-01-27 | Disposition: A | Discharge status: Home / Self Care | Source: Ambulatory Visit | Attending: Pulmonary Disease | Admitting: Pulmonary Disease

## 2024-01-27 DIAGNOSIS — J984 Other disorders of lung: Secondary | ICD-10-CM

## 2024-01-27 DIAGNOSIS — I272 Pulmonary hypertension, unspecified: Secondary | ICD-10-CM | POA: Diagnosis not present

## 2024-01-27 NOTE — Progress Notes (Signed)
 Daily Session Note  Patient Details  Name: Amber Bennett MRN: [id number] Date of Birth: [birth year] Referring Provider:   Conrad Ports Pulmonary Rehab Walk Test from 12/19/2023 in [facility] for Heart, Vascular, & Lung Health  Referring Provider Dr. Lonni Nanas    Encounter Date: 01/27/2024  Check In:  Session Check In - 01/27/24 1328       Check-In   Supervising physician immediately available to respond to emergencies [facility] MD immediately available    Physician(s) Barnie Press, NP    Location [facility]    Staff Present Johnnie Moats, MS, ACSM-CEP, Exercise Physiologist;Mary Harvy, RN, BSN;Casey Claudene, RT;Randi Reeve BS, ACSM-CEP, Exercise Physiologist;Samantha Belarus, RD, LDN    Virtual Visit No    Medication changes reported     No    Fall or balance concerns reported    No    Tobacco Cessation No Change    Warm-up and Cool-down Performed as group-led instruction    Resistance Training Performed Yes    VAD Patient? No    PAD/SET Patient? No      Pain Assessment   Currently in Pain? No/denies    Multiple Pain Sites No          Capillary Blood Glucose: No results found for this or any previous visit (from the past 24 hours).    Social History   Tobacco Use  Smoking Status Former   Current packs/day: 1.50   Average packs/day: 1.5 packs/day for 45.6 years (68.4 ttl pk-yrs)   Types: Cigarettes   Start date: 07/01/1978  Smokeless Tobacco Never  Tobacco Comments   Former smoker 04/10/23    Goals Met:  Proper associated with RPD/PD & O2 Sat Exercise tolerated well No report of concerns or symptoms today Strength training completed today  Goals Unmet:  Not Applicable  Comments: Service time is from 1317 to 1437.    Dr. Slater Staff is Medical Director for Pulmonary Rehab at [facility].

## 2024-01-28 ENCOUNTER — Other Ambulatory Visit (HOSPITAL_COMMUNITY): Payer: Self-pay

## 2024-01-29 ENCOUNTER — Encounter (HOSPITAL_COMMUNITY)
Admission: RE | Admit: 2024-01-29 | Discharge: 2024-01-29 | Disposition: A | Discharge status: Home / Self Care | Source: Ambulatory Visit | Attending: Pulmonary Disease

## 2024-01-29 DIAGNOSIS — J984 Other disorders of lung: Secondary | ICD-10-CM

## 2024-01-29 DIAGNOSIS — I272 Pulmonary hypertension, unspecified: Secondary | ICD-10-CM | POA: Diagnosis not present

## 2024-01-29 NOTE — Progress Notes (Signed)
 Daily Session Note  Patient Details  Name: Amber Bennett MRN: [id number] Date of Birth: 07-25-1950 Referring Provider:   Conrad Ports [facility] Rehab Walk Test from 12/19/2023 in [facility]  Referring Provider Dr. Lonni Nanas    Encounter Date: 01/29/2024  Check In:  Session Check In - 01/29/24 1333       Check-In   Supervising physician immediately available to respond to emergencies [facility] MD immediately available    Physician(s) Josefa Beauvais, NP    Location [facility]    Staff Present Johnnie Moats, MS, ACSM-CEP, Exercise Physiologist;Karina Lenderman Harvy, RN, BSN;Casey Claudene, RT;Randi Reeve BS, ACSM-CEP, Exercise Physiologist    Virtual Visit No    Medication changes reported     No    Fall or balance concerns reported    No    Tobacco Cessation No Change    Warm-up and Cool-down Performed as group-led instruction    Resistance Training Performed Yes    VAD Patient? No    PAD/SET Patient? No      Pain Assessment   Currently in Pain? No/denies    Pain Score 0-No pain    Multiple Pain Sites No          Capillary Blood Glucose: No results found for this or any previous visit (from the past 24 hours).    Social History   Tobacco Use  Smoking Status Former   Current packs/day: 1.50   Average packs/day: 1.5 packs/day for 45.6 years (68.4 ttl pk-yrs)   Types: Cigarettes   Start date: 07/01/1978  Smokeless Tobacco Never  Tobacco Comments   Former smoker 04/10/23    Goals Met:  Exercise tolerated well No report of concerns or symptoms today Strength training completed today  Goals Unmet:  Not Applicable  Comments: Service time is from 1316 to 1444    Dr. Slater Staff is Medical Director for Pulmonary Rehab at [facility].

## 2024-02-03 ENCOUNTER — Encounter (HOSPITAL_COMMUNITY)
Admission: RE | Admit: 2024-02-03 | Discharge: 2024-02-03 | Disposition: A | Discharge status: Home / Self Care | Source: Ambulatory Visit | Attending: Pulmonary Disease | Admitting: Pulmonary Disease

## 2024-02-03 VITALS — Wt 159.4 lb

## 2024-02-03 DIAGNOSIS — J984 Other disorders of lung: Secondary | ICD-10-CM | POA: Diagnosis not present

## 2024-02-03 DIAGNOSIS — I272 Pulmonary hypertension, unspecified: Secondary | ICD-10-CM | POA: Diagnosis not present

## 2024-02-03 NOTE — Progress Notes (Signed)
 Daily Session Note  Patient Details  Name: Amber Bennett MRN: [id number] Date of Birth: [birth year] Referring Provider:   Conrad Ports Pulmonary Rehab Walk Test from 12/19/2023 in [facility]  Referring Provider Dr. Lonni Nanas    Encounter Date: 02/03/2024  Check In:  Session Check In - 02/03/24 1328       Check-In   Supervising physician immediately available to respond to emergencies [facility] MD immediately available    Physician(s) Josefa Beauvais, NP    Location [facility]    Staff Present Johnnie Moats, MS, ACSM-CEP, Exercise Physiologist;Ersel Enslin Harvy, RN, BSN;Casey Claudene, RT;Randi Reeve BS, ACSM-CEP, Exercise Physiologist    Virtual Visit No    Medication changes reported     No    Fall or balance concerns reported    No    Tobacco Cessation No Change    Warm-up and Cool-down Performed as group-led instruction    Resistance Training Performed Yes    VAD Patient? No    PAD/SET Patient? No      Pain Assessment   Currently in Pain? No/denies    Pain Score 0-No pain    Multiple Pain Sites No          Capillary Blood Glucose: No results found for this or any previous visit (from the past 24 hours).   Exercise Prescription Changes - 02/03/24 1500       Response to Exercise   Blood Pressure (Admit) 110/64    Blood Pressure (Exercise) 126/56    Blood Pressure (Exit) 90/50   asymptomatic   Heart Rate (Admit) 76 bpm    Heart Rate (Exercise) 90 bpm    Heart Rate (Exit) 82 bpm    Oxygen  Saturation (Admit) 98 %    Oxygen  Saturation (Exercise) 96 %    Oxygen  Saturation (Exit) 96 %    Rating of Perceived Exertion (Exercise) 14    Perceived Dyspnea (Exercise) 3    Duration Continue with 30 min of aerobic exercise without signs/symptoms of physical distress.    Intensity THRR unchanged      Progression   Progression Continue to progress workloads to maintain intensity without signs/symptoms  of physical distress.      Resistance Training   Training Prescription Yes    Weight blue bands    Reps 10-15    Time 10 Minutes      NuStep   Level 3    SPM 100    Minutes 15    METs 2.4      Recumbant Elliptical   Level 3    RPM 45    Watts 53    Minutes 15    METs 2.8          Social History   Tobacco Use  Smoking Status Former   Current packs/day: 1.50   Average packs/day: 1.5 packs/day for 45.6 years (68.4 ttl pk-yrs)   Types: Cigarettes   Start date: 07/01/1978  Smokeless Tobacco Never  Tobacco Comments   Former smoker 04/10/23    Goals Met:  Independence with exercise equipment Exercise tolerated well No report of concerns or symptoms today Strength training completed today  Goals Unmet:  Not Applicable  Comments: Service time is from 1309 to 1449    Dr. Slater Staff is Medical Director for Pulmonary Rehab at [facility].

## 2024-02-04 NOTE — Progress Notes (Signed)
 Pulmonary Individual Treatment Plan  Patient Details  Name: Amber Bennett MRN: [id number] Date of Birth: 01/26/1951 Referring Provider:   Conrad Ports Pulmonary Rehab Walk Test from 12/19/2023 in [facility]  Referring Provider Dr. Lonni Nanas    Initial Encounter Date:  Flowsheet Row Pulmonary Rehab Walk Test from 12/19/2023 in [facility]  Date 12/19/23    Visit Diagnosis: Restrictive lung disease  Pulmonary HTN (HCC)  Patient's Home Medications on Admission:   Current Outpatient Medications:    acetaminophen  (TYLENOL ) 500 MG tablet, Take 500 mg by mouth every 6 (six) hours as needed for moderate pain (pain score 4-6) or mild pain (pain score 1-3)., Disp: , Rfl:    Alirocumab  (PRALUENT ) 150 MG/ML SOAJ, Inject 1 mL (150 mg total) into the skin every 14 (fourteen) days., Disp: 6 mL, Rfl: 3   amLODipine  (NORVASC ) 2.5 MG tablet, Take 1 tablet (2.5 mg total) by mouth at bedtime., Disp: 90 tablet, Rfl: 3   apixaban  (ELIQUIS ) 5 MG TABS tablet, Take 1 tablet (5 mg total) by mouth 2 (two) times daily., Disp: 180 tablet, Rfl: 1   Ascorbic Acid (VITAMIN C) 1000 MG tablet, Take 1,000 mg by mouth daily. Airborne emergen c, Disp: , Rfl:    beclomethasone (QVAR  REDIHALER) 80 MCG/ACT inhaler, Inhale 1 puff into the lungs 2 (two) times daily., Disp: 31.8 g, Rfl: 1   Calcium Carb-Cholecalciferol  (CALTRATE 600+D3 PO), Take 1 tablet by mouth daily., Disp: , Rfl:    Cholecalciferol  (VITAMIN D ) 50 MCG (2000 UT) tablet, Take 2,000 Units by mouth daily., Disp: , Rfl:    Coenzyme Q10 (CO Q 10 PO), Take 200 mg by mouth daily., Disp: , Rfl:    colestipol  (COLESTID ) 1 g tablet, TAKE 2 TABLETS(2 GRAMS) BY MOUTH AT BEDTIME, Disp: 180 tablet, Rfl: 0   cyclobenzaprine  (FLEXERIL ) 5 MG tablet, Take 1 tablet (5 mg total) by mouth every 8 (eight) hours as needed., Disp: 30 tablet, Rfl: 1    diphenoxylate -atropine  (LOMOTIL ) 2.5-0.025 MG tablet, Take 1-2 tablets by mouth every 4 hours as needed for diarrhea/loose stools, Disp: 90 tablet, Rfl: 1   DULoxetine  (CYMBALTA ) 30 MG capsule, Take 1 capsule (30 mg total) by mouth every evening., Disp: 90 capsule, Rfl: 4   ezetimibe  (ZETIA ) 10 MG tablet, Take 10 mg by mouth every evening., Disp: , Rfl:    furosemide  (LASIX ) 40 MG tablet, Take 2 tablets (80 mg total) by mouth daily., Disp: 90 tablet, Rfl: 3   gabapentin  (NEURONTIN ) 800 MG tablet, Take 1 tablet (800 mg total) by mouth 2 (two) times daily., Disp: 180 tablet, Rfl: 3   ibandronate (BONIVA) 150 MG tablet, Take 150 mg by mouth every 30 (thirty) days., Disp: , Rfl:    ipratropium (ATROVENT ) 0.03 % nasal spray, Place 2 sprays into both nostrils every 12 (twelve) hours., Disp: 30 mL, Rfl: 4   ipratropium-albuterol  (DUONEB) 0.5-2.5 (3) MG/3ML SOLN, Take 3 mLs by nebulization 3 (three) times daily as needed., Disp: 360 mL, Rfl: 5   loratadine  (CLARITIN ) 10 MG tablet, Take 1 tablet (10 mg total) by mouth daily as needed for allergies (Can take an extra dose during flare ups.)., Disp: 90 tablet, Rfl: 1   metoprolol  tartrate (LOPRESSOR ) 50 MG tablet, Take 1 tablet (50 mg total) by mouth 2 (two) times daily., Disp: , Rfl:    mometasone  (ASMANEX ) 220 MCG/ACT inhaler, Inhale 1 puff into  the lungs 2 (two) times daily., Disp: 3 each, Rfl: 1   Multiple Vitamins-Minerals (MULTIVITAMIN PO), Take 1 tablet by mouth daily., Disp: , Rfl:    Multiple Vitamins-Minerals (PRESERVISION AREDS PO), Take 1 capsule by mouth daily., Disp: , Rfl:    Olopatadine-Mometasone  (RYALTRIS ) 665-25 MCG/ACT SUSP, Place 2 sprays into the nose 2 (two) times daily., Disp: 87 g, Rfl: 1   omeprazole (PRILOSEC) 20 MG capsule, Take 20 mg by mouth 2 (two) times daily before a meal., Disp: , Rfl: 3   PHENobarbital  (LUMINAL) 97.2 MG tablet, Take 1.5 tablets (145.8 mg total) by mouth daily., Disp: 135 tablet, Rfl: 2   potassium chloride  SA  (KLOR-CON  M) 20 MEQ tablet, While taking 40 mg of Furosemide , take Potassium Chloride  20 meq twice a day. When you decrease Furosemide  to 20 mg, decrease Potassium Chloride  to 20 meq once a day., Disp: 180 tablet, Rfl: 3   PRALUENT  150 MG/ML SOAJ, Inject 150 mg into the skin every 14 (fourteen) days., Disp: , Rfl:    pyridoxine  (B-6) 100 MG tablet, Take 100 mg by mouth daily., Disp: , Rfl:    sacubitril-valsartan (ENTRESTO ) 49-51 MG, TAKE 1 TABLET BY MOUTH TWICE DAILY, Disp: 180 tablet, Rfl: 2   topiramate  (TOPAMAX ) 50 MG tablet, TAKE 1 TABLET BY MOUTH IN THE MORNIGN& 2 TABS EVERY EVENING, Disp: 270 tablet, Rfl: 1   vitamin E 400 UNIT capsule, Take 400 Units by mouth daily., Disp: , Rfl:    ZEPBOUND  2.5 MG/0.5ML Pen, Inject 2.5 mg into the skin once a week. 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   Carpal tunnel syndrome    Cervical radiculitis    Chronic diarrhea 10/24/2023   Collar bone fracture    Depression    Dermatomyositis (HCC)    Diabetes mellitus without complication (HCC)    Dysrhythmia    Elevated liver enzymes    External hemorrhoids    Fatty liver    Fibromyalgia    Fibromyalgia affecting hand 08/2022   bilateral wrist and hands   Hematuria    negative evaluation   High cholesterol    HTN (hypertension)    Internal hemorrhoids    Laryngitis    Lower back pain    Macular degeneration    Nonalcoholic steatohepatitis (NASH)    Nonalcoholic steatohepatitis (NASH)    Osteopenia    Left side hip   Seizures (HCC)    09/07/2019 pt reports last seizure in 2014   Tubular adenoma of colon     Tobacco Use: Social History   Tobacco Use  Smoking Status Former   Current packs/day: 1.50   Average packs/day: 1.5 packs/day for 45.6 years (68.4 ttl pk-yrs)   Types:  Cigarettes   Start date: 07/01/1978  Smokeless Tobacco Never  Tobacco Comments   Former smoker 04/10/23    Labs: Review Flowsheet       Latest Ref Rng & Units 02/05/2022 11/27/2023  Labs for ITP Cardiac and Pulmonary Rehab  Hemoglobin A1c 4.8 - 5.6 % 4.9  -  Bicarbonate 20.0 - 28.0 mmol/L - 21.7  23.0  22.1   TCO2 22 - 32 mmol/L - 23  24  23    Acid-base deficit 0.0 - 2.0 mmol/L - 3.0  2.0  3.0   O2 Saturation % - 79  78  78     Details       Multiple values from one day are sorted in reverse-chronological order         Capillary Blood Glucose: Lab Results  Component Value Date   GLUCAP 106 (H) 11/27/2023   GLUCAP 99 09/19/2023   GLUCAP 122 (H) 09/19/2023   GLUCAP 153 (H) 03/13/2023   GLUCAP 111 (H) 03/13/2023     Pulmonary Assessment Scores:  Pulmonary Assessment Scores     Row Name 12/19/23 1416         ADL UCSD   ADL Phase Entry     SOB Score total 28       CAT Score   CAT Score 18       mMRC Score   mMRC Score 0       UCSD: Self-administered rating of dyspnea associated with activities of daily living (ADLs) 6-point scale (0 = not at all to 5 = maximal or unable to do because of breathlessness)  Scoring Scores range from 0 to 120.  Minimally important difference is 5 units  CAT: CAT can identify the health impairment of COPD patients and is better correlated with disease progression.  CAT has a scoring range of zero to 40. The CAT score is classified into four groups of low (less than 10), medium (10 - 20), high (21-30) and very high (31-40) based on the impact level of disease on health status. A CAT score over 10 suggests significant symptoms.  A worsening CAT score could be explained by an exacerbation, poor medication adherence, poor inhaler technique, or progression of COPD or comorbid conditions.  CAT MCID is 2 points  mMRC: mMRC (Modified Medical Research Council) Dyspnea Scale is used to assess the degree of baseline functional disability  in patients of respiratory disease due to dyspnea. No minimal important difference is established. A decrease in score of 1 point or greater is considered a positive change.   Pulmonary Function Assessment:  Pulmonary Function Assessment - 12/19/23 1415       Breath   Shortness of Breath Yes;Limiting activity          Exercise Target Goals: Exercise Program Goal: Individual exercise prescription set using results from initial 6 min walk test and THRR while considering  patient's activity barriers and safety.   Exercise Prescription Goal: Initial exercise prescription builds to 30-45 minutes a day of aerobic activity, 2-3 days per week.  Home exercise guidelines will be given to patient during program as part of exercise prescription that the participant will acknowledge.  Activity Barriers & Risk Stratification:  Activity Barriers & Cardiac Risk Stratification - 12/19/23 1416       Activity Barriers & Cardiac Risk Stratification   Activity Barriers Back Problems;Neck/Spine Problems;Right Hip Replacement;Balance Concerns;Arthritis;Joint Problems;History of Falls          6 Minute Walk:  6 Minute Walk     Row Name 12/19/23 1522         6 Minute Walk   Phase Initial     Distance 872 feet     Walk Time 6 minutes     # of Rest Breaks 0     MPH 1.65     METS 1.59     RPE 11     Perceived Dyspnea  1     VO2 Peak 5.55     Symptoms No     Resting  HR 83 bpm     Resting BP 126/62     Resting Oxygen  Saturation  98 %     Exercise Oxygen  Saturation  during 6 min walk 96 %     Max Ex. HR 100 bpm     Max Ex. BP 146/62     2 Minute Post BP 130/60       Interval HR   1 Minute HR 90     2 Minute HR 94     3 Minute HR 98     4 Minute HR 100     5 Minute HR 98     6 Minute HR 98     2 Minute Post HR 84     Interval Heart Rate? Yes       Interval Oxygen    Interval Oxygen ? Yes     Baseline Oxygen  Saturation % 98 %     1 Minute Oxygen  Saturation % 98 %     1 Minute  Liters of Oxygen  0 L     2 Minute Oxygen  Saturation % 96 %     2 Minute Liters of Oxygen  0 L     3 Minute Oxygen  Saturation % 97 %     3 Minute Liters of Oxygen  0 L     4 Minute Oxygen  Saturation % 98 %     4 Minute Liters of Oxygen  0 L     5 Minute Oxygen  Saturation % 98 %     5 Minute Liters of Oxygen  0 L     6 Minute Oxygen  Saturation % 97 %     6 Minute Liters of Oxygen  0 L     2 Minute Post Oxygen  Saturation % 99 %     2 Minute Post Liters of Oxygen  0 L        Oxygen  Initial Assessment:  Oxygen  Initial Assessment - 12/19/23 1414       Home Oxygen    Home Oxygen  Device None    Sleep Oxygen  Prescription CPAP    Liters per minute 3    Home Exercise Oxygen  Prescription None    Home Resting Oxygen  Prescription None    Compliance with Home Oxygen  Use Yes      Initial 6 min Walk   Oxygen  Used None      Program Oxygen  Prescription   Program Oxygen  Prescription None      Intervention   Short Term Goals To learn and exhibit compliance with exercise, home and travel O2 prescription;To learn and understand importance of maintaining oxygen  saturations>88%;To learn and demonstrate proper use of respiratory medications;To learn and understand importance of monitoring SPO2 with pulse oximeter and demonstrate accurate use of the pulse oximeter.;To learn and demonstrate proper pursed lip breathing techniques or other breathing techniques.     Long  Term Goals Verbalizes importance of monitoring SPO2 with pulse oximeter and return demonstration;Exhibits compliance with exercise, home  and travel O2 prescription;Maintenance of O2 saturations>88%;Exhibits proper breathing techniques, such as pursed lip breathing or other method taught during program session;Compliance with respiratory medication;Demonstrates proper use of MDI's          Oxygen  Re-Evaluation:  Oxygen  Re-Evaluation     Row Name 01/01/24 0849 02/03/24 0852           Program Oxygen  Prescription   Program Oxygen   Prescription None None        Home Oxygen    Home Oxygen  Device None None      Sleep Oxygen  Prescription  CPAP CPAP      Liters per minute 3 3      Home Exercise Oxygen  Prescription None None      Home Resting Oxygen  Prescription None None      Compliance with Home Oxygen  Use Yes Yes        Goals/Expected Outcomes   Short Term Goals To learn and exhibit compliance with exercise, home and travel O2 prescription;To learn and understand importance of maintaining oxygen  saturations>88%;To learn and demonstrate proper use of respiratory medications;To learn and understand importance of monitoring SPO2 with pulse oximeter and demonstrate accurate use of the pulse oximeter.;To learn and demonstrate proper pursed lip breathing techniques or other breathing techniques.  To learn and exhibit compliance with exercise, home and travel O2 prescription;To learn and understand importance of maintaining oxygen  saturations>88%;To learn and demonstrate proper use of respiratory medications;To learn and understand importance of monitoring SPO2 with pulse oximeter and demonstrate accurate use of the pulse oximeter.;To learn and demonstrate proper pursed lip breathing techniques or other breathing techniques.       Long  Term Goals Verbalizes importance of monitoring SPO2 with pulse oximeter and return demonstration;Exhibits compliance with exercise, home  and travel O2 prescription;Maintenance of O2 saturations>88%;Exhibits proper breathing techniques, such as pursed lip breathing or other method taught during program session;Compliance with respiratory medication;Demonstrates proper use of MDI's Verbalizes importance of monitoring SPO2 with pulse oximeter and return demonstration;Exhibits compliance with exercise, home  and travel O2 prescription;Maintenance of O2 saturations>88%;Exhibits proper breathing techniques, such as pursed lip breathing or other method taught during program session;Compliance with respiratory  medication;Demonstrates proper use of MDI's      Goals/Expected Outcomes Compliance and understanding of oxygen  saturation monitoring and breathing techniques to decrease shortness of breath. Compliance and understanding of oxygen  saturation monitoring and breathing techniques to decrease shortness of breath.         Oxygen  Discharge (Final Oxygen  Re-Evaluation):  Oxygen  Re-Evaluation - 02/03/24 0852       Program Oxygen  Prescription   Program Oxygen  Prescription None      Home Oxygen    Home Oxygen  Device None    Sleep Oxygen  Prescription CPAP    Liters per minute 3    Home Exercise Oxygen  Prescription None    Home Resting Oxygen  Prescription None    Compliance with Home Oxygen  Use Yes      Goals/Expected Outcomes   Short Term Goals To learn and exhibit compliance with exercise, home and travel O2 prescription;To learn and understand importance of maintaining oxygen  saturations>88%;To learn and demonstrate proper use of respiratory medications;To learn and understand importance of monitoring SPO2 with pulse oximeter and demonstrate accurate use of the pulse oximeter.;To learn and demonstrate proper pursed lip breathing techniques or other breathing techniques.     Long  Term Goals Verbalizes importance of monitoring SPO2 with pulse oximeter and return demonstration;Exhibits compliance with exercise, home  and travel O2 prescription;Maintenance of O2 saturations>88%;Exhibits proper breathing techniques, such as pursed lip breathing or other method taught during program session;Compliance with respiratory medication;Demonstrates proper use of MDI's    Goals/Expected Outcomes Compliance and understanding of oxygen  saturation monitoring and breathing techniques to decrease shortness of breath.          Initial Exercise Prescription:  Initial Exercise Prescription - 12/19/23 1500       Date of Initial Exercise RX and Referring Provider   Date 12/19/23    Referring Provider Dr.  Lonni Nanas    Expected Discharge Date 03/18/24  NuStep   Level 2    SPM 60    Minutes 15    METs 1.1      Recumbant Elliptical   Level 1    RPM 26    Watts 12    Minutes 15    METs 1.2      Prescription Details   Frequency (times per week) 2    Duration Progress to 30 minutes of continuous aerobic without signs/symptoms of physical distress      Intensity   THRR 40-80% of Max Heartrate 58-116    Ratings of Perceived Exertion 11-13    Perceived Dyspnea 0-4      Progression   Progression Continue to progress workloads to maintain intensity without signs/symptoms of physical distress.      Resistance Training   Training Prescription Yes    Weight red bands    Reps 10-15          Perform Capillary Blood Glucose checks as needed.  Exercise Prescription Changes:   Exercise Prescription Changes     Row Name 01/06/24 1400 01/20/24 1500 02/03/24 1500         Response to Exercise   Blood Pressure (Admit) 114/58 128/54 110/64     Blood Pressure (Exercise) 118/58 116/64 126/56     Blood Pressure (Exit) 114/60 122/68 90/50  asymptomatic     Heart Rate (Admit) 76 bpm 89 bpm 76 bpm     Heart Rate (Exercise) 79 bpm 95 bpm 90 bpm     Heart Rate (Exit) 68 bpm 79 bpm 82 bpm     Oxygen  Saturation (Admit) 95 % 97 % 98 %     Oxygen  Saturation (Exercise) 96 % 95 % 96 %     Oxygen  Saturation (Exit) 98 % 97 % 96 %     Rating of Perceived Exertion (Exercise) 11 13 14      Perceived Dyspnea (Exercise) 1 2 3      Duration Continue with 30 min of aerobic exercise without signs/symptoms of physical distress. Continue with 30 min of aerobic exercise without signs/symptoms of physical distress. Continue with 30 min of aerobic exercise without signs/symptoms of physical distress.     Intensity THRR unchanged THRR unchanged THRR unchanged       Progression   Progression Continue to progress workloads to maintain intensity without signs/symptoms of physical distress. Continue  to progress workloads to maintain intensity without signs/symptoms of physical distress. Continue to progress workloads to maintain intensity without signs/symptoms of physical distress.       Resistance Training   Training Prescription Yes Yes Yes     Weight red bands red bands blue bands     Reps 10-15 10-15 10-15     Time 10 Minutes 10 Minutes 10 Minutes       NuStep   Level 2 2 3      SPM 98 -- 100     Minutes 15 15 15      METs 2.5 2.9 2.4       Recumbant Elliptical   Level 2 2 3      RPM -- -- 45     Watts -- -- 53     Minutes 15 15 15      METs -- 3.7 2.8        Exercise Comments:   Exercise Comments     Row Name 12/30/23 1522           Exercise Comments Pt completed first day of group exercise. She exercised  on the recumbent stepper for 15 min, level 2, METs 2.7. She then exercised on the recumbent elliptical for 15 min, level 1, METs around 3. She needed occasionally rest breaks due to a groin muscle that tenses with exercise. She performed warm up and cool down standing, including squats. Discussed METs with fair reception.          Exercise Goals and Review:   Exercise Goals     Row Name 12/19/23 1417             Exercise Goals   Increase Physical Activity Yes       Intervention Provide advice, education, support and counseling about physical activity/exercise needs.;Develop an individualized exercise prescription for aerobic and resistive training based on initial evaluation findings, risk stratification, comorbidities and participant's personal goals.       Expected Outcomes Short Term: Attend rehab on a regular basis to increase amount of physical activity.;Long Term: Add in home exercise to make exercise part of routine and to increase amount of physical activity.;Long Term: Exercising regularly at least 3-5 days a week.       Increase Strength and Stamina Yes       Intervention Provide advice, education, support and counseling about physical  activity/exercise needs.;Develop an individualized exercise prescription for aerobic and resistive training based on initial evaluation findings, risk stratification, comorbidities and participant's personal goals.       Expected Outcomes Short Term: Increase workloads from initial exercise prescription for resistance, speed, and METs.;Short Term: Perform resistance training exercises routinely during rehab and add in resistance training at home;Long Term: Improve cardiorespiratory fitness, muscular endurance and strength as measured by increased METs and functional capacity ( )       Able to understand and use rate of perceived exertion (RPE) scale Yes       Intervention Provide education and explanation on how to use RPE scale       Expected Outcomes Short Term: Able to use RPE daily in rehab to express subjective intensity level;Long Term:  Able to use RPE to guide intensity level when exercising independently       Able to understand and use Dyspnea scale Yes       Intervention Provide education and explanation on how to use Dyspnea scale       Expected Outcomes Short Term: Able to use Dyspnea scale daily in rehab to express subjective sense of shortness of breath during exertion;Long Term: Able to use Dyspnea scale to guide intensity level when exercising independently       Knowledge and understanding of Target Heart Rate Range (THRR) Yes       Intervention Provide education and explanation of THRR including how the numbers were predicted and where they are located for reference       Expected Outcomes Short Term: Able to state/look up THRR;Long Term: Able to use THRR to govern intensity when exercising independently;Short Term: Able to use daily as guideline for intensity in rehab       Understanding of Exercise Prescription Yes       Intervention Provide education, explanation, and written materials on patient's individual exercise prescription       Expected Outcomes Short Term: Able to  explain program exercise prescription;Long Term: Able to explain home exercise prescription to exercise independently          Exercise Goals Re-Evaluation :  Exercise Goals Re-Evaluation     Row Name 01/01/24 [id number] 02/03/24 0848  Exercise Goal Re-Evaluation   Exercise Goals Review Increase Physical Activity;Able to understand and use Dyspnea scale;Understanding of Exercise Prescription;Increase Strength and Stamina;Knowledge and understanding of Target Heart Rate Range (THRR);Able to understand and use rate of perceived exertion (RPE) scale Increase Physical Activity;Able to understand and use Dyspnea scale;Understanding of Exercise Prescription;Increase Strength and Stamina;Knowledge and understanding of Target Heart Rate Range (THRR);Able to understand and use rate of perceived exertion (RPE) scale      Comments Pt completed one exercise session. She exercised on the recumbent stepper for 15 min, level 2, METs 2.7. She then exercised on the recumbent elliptical for 15 min, level 1, METs approximately 3. She pushed herself but also has to take breaks due to hip/groin cramps. She is motivated. Performed warm up and cool down without significant limitations. Using blue bands, 5.8 lbs. Will progress as tolerated. Pt has completed 9 exercise session. She is exercising on the recumbent stepper for 15 min, level 2, METs 2.9. She then is exercising on the recumbent elliptical for 15 min, level 2, METs approximately 3.5. She has mostly overcome her back/hip pain with time. Will now increase level. Performs warm up and cool down without significant limitations. Using blue bands, 5.8 lbs. Will progress as tolerated.      Expected Outcomes Through exercise at rehab and home, the patient will decrease shortness of breath and feel confident in carrying out an exercise regimen at home. Through exercise at rehab and home, the patient will decrease shortness of breath and feel confident in carrying out an  exercise regimen at home.         Discharge Exercise Prescription (Final Exercise Prescription Changes):  Exercise Prescription Changes - 02/03/24 1500       Response to Exercise   Blood Pressure (Admit) 110/64    Blood Pressure (Exercise) 126/56    Blood Pressure (Exit) 90/50   asymptomatic   Heart Rate (Admit) 76 bpm    Heart Rate (Exercise) 90 bpm    Heart Rate (Exit) 82 bpm    Oxygen  Saturation (Admit) 98 %    Oxygen  Saturation (Exercise) 96 %    Oxygen  Saturation (Exit) 96 %    Rating of Perceived Exertion (Exercise) 14    Perceived Dyspnea (Exercise) 3    Duration Continue with 30 min of aerobic exercise without signs/symptoms of physical distress.    Intensity THRR unchanged      Progression   Progression Continue to progress workloads to maintain intensity without signs/symptoms of physical distress.      Resistance Training   Training Prescription Yes    Weight blue bands    Reps 10-15    Time 10 Minutes      NuStep   Level 3    SPM 100    Minutes 15    METs 2.4      Recumbant Elliptical   Level 3    RPM 45    Watts 53    Minutes 15    METs 2.8          Nutrition:  Target Goals: Understanding of nutrition guidelines, daily intake of sodium 1500mg , cholesterol 200mg , calories 30% from fat and 7% or less from saturated fats, daily to have 5 or more servings of fruits and vegetables.  Biometrics:    Nutrition Therapy Plan and Nutrition Goals:  Nutrition Therapy & Goals - 01/29/24 1354       Nutrition Therapy   Diet General healthy diet      Personal  Nutrition Goals   Nutrition Goal Patient to improve diet quality by using the plate method as a guide for meal planning to include lean protein/plant protein, fruits, vegetables, whole grains, nonfat dairy as part of a well-balanced diet.   goal in action.   Personal Goal #2 Patient to identify strategies for weight loss of 0.5-2.0# per week.   goal not met.   Comments Goals in progress. Xylah  has medical history restrictive lung disease, pulmonary HTN, heart failure, NASH, OSA. She is taking zepbound  for weight loss and has started following the Mediterranean diet. Zepbound  was recently increased to 10mg  and she has maintained her weight since starting the medication. Have discussed multiple strategies for weight loss including calorie density, the plate method as a guide for meal planning, protein supplements, etc.  Patient will benefit from participation in pulmonary  rehab for nutrition education, exercise, and lifestyle modification.      Intervention Plan   Intervention Prescribe, educate and counsel regarding individualized specific dietary modifications aiming towards targeted core components such as weight, hypertension, lipid management, diabetes, heart failure and other comorbidities.;Nutrition handout(s) given to patient.    Expected Outcomes Short Term Goal: Understand basic principles of dietary content, such as calories, fat, sodium, cholesterol and nutrients.;Long Term Goal: Adherence to prescribed nutrition plan.          Nutrition Assessments:  Nutrition Assessments - 12/30/23 1521       Rate Your Plate Scores   Pre Score 74         MEDIFICTS Score Key: >=70 Need to make dietary changes  40-70 Heart Healthy Diet <= 40 Therapeutic Level Cholesterol Diet  Flowsheet Row PULMONARY REHAB OTHER RESPIRATORY from 12/30/2023 in [facility] for Heart, Vascular, & Lung Health  Picture Your Plate Total Score on Admission 74   Picture Your Plate Scores: <59 Unhealthy dietary pattern with much room for improvement. 41-50 Dietary pattern unlikely to meet recommendations for good health and room for improvement. 51-60 More healthful dietary pattern, with some room for improvement.  >60 Healthy dietary pattern, although there may be some specific behaviors that could be improved.    Nutrition Goals Re-Evaluation:  Nutrition Goals Re-Evaluation      Row Name 01/01/24 1455 01/29/24 1354           Goals   Current Weight 160 lb 11.5 oz (72.9 kg) 160 lb 0.9 oz (72.6 kg)      Comment A1c WNL A1c WNL      Expected Outcome Ashleyann has medical history restrictive lung disease, pulmonary HTN, heart failure, NASH, OSA. She is taking zepbound  for weight loss and has started following the Mediterranean diet. She has maintained her weight since starting the medication. Will continue to educate on strategies for weight loss including calorie density, the plate method as a guide for meal planning, etc. Patient will benefit from participation in pulmonary rehab for nutrition education, exercise, and lifestyle modification. Goals in progress. Max has medical history restrictive lung disease, pulmonary HTN, heart failure, NASH, OSA. She is taking zepbound  for weight loss and has started following the Mediterranean diet. Zepbound  was recently increased to 10mg  and she has maintained her weight since starting the medication. Have discussed multiple strategies for weight loss including calorie density, the plate method as a guide for meal planning, protein supplements, etc. Patient will benefit from participation in pulmonary rehab for nutrition education, exercise, and lifestyle modification.         Nutrition Goals Discharge (  Final Nutrition Goals Re-Evaluation):  Nutrition Goals Re-Evaluation - 01/29/24 1354       Goals   Current Weight 160 lb 0.9 oz (72.6 kg)    Comment A1c WNL    Expected Outcome Goals in progress. Niaya has medical history restrictive lung disease, pulmonary HTN, heart failure, NASH, OSA. She is taking zepbound  for weight loss and has started following the Mediterranean diet. Zepbound  was recently increased to 10mg  and she has maintained her weight since starting the medication. Have discussed multiple strategies for weight loss including calorie density, the plate method as a guide for meal planning, protein supplements, etc.  Patient will benefit from participation in pulmonary rehab for nutrition education, exercise, and lifestyle modification.          Psychosocial: Target Goals: Acknowledge presence or absence of significant depression and/or stress, maximize coping skills, provide positive support system. Participant is able to verbalize types and ability to use techniques and skills needed for reducing stress and depression.  Initial Review & Psychosocial Screening:  Initial Psych Review & Screening - 12/19/23 1410       Initial Review   Current issues with None Identified      Family Dynamics   Good Support System? Yes    Comments Val's son and spouse      Barriers   Psychosocial barriers to participate in program There are no identifiable barriers or psychosocial needs.      Screening Interventions   Interventions Encouraged to exercise          Quality of Life Scores:  Scores of 19 and below usually indicate a poorer quality of life in these areas.  A difference of  2-3 points is a clinically meaningful difference.  A difference of 2-3 points in the total score of the Quality of Life Index has been associated with significant improvement in overall quality of life, self-image, physical symptoms, and general health in studies assessing change in quality of life.  PHQ-9: Review Flowsheet       12/19/2023 06/18/2016  Depression screen PHQ 2/9  Decreased Interest 0 0  Down, Depressed, Hopeless 0 0  PHQ - 2 Score 0 0  Altered sleeping 0 0  Tired, decreased energy 1 0  Change in appetite 0 0  Feeling bad or failure about yourself  0 0  Trouble concentrating 0 0  Moving slowly or fidgety/restless 0 0  Suicidal thoughts 0 0   PHQ-9 Score 1 0  Difficult doing work/chores Somewhat difficult -    Details       Data saved with a previous flowsheet row definition        Interpretation of Total Score  Total Score Depression Severity:  1-4 = Minimal depression, 5-9 = Mild depression,  10-14 = Moderate depression, 15-19 = Moderately severe depression, 20-27 = Severe depression   Psychosocial Evaluation and Intervention:  Psychosocial Evaluation - 12/19/23 1411       Psychosocial Evaluation & Interventions   Interventions Encouraged to exercise with the program and follow exercise prescription    Comments Maurisa denies any psychosocial barriers at this time.    Expected Outcomes For Addie to particpate in [place] without any psychosocial barriers    Continue Psychosocial Services  No Follow up required          Psychosocial Re-Evaluation:  Psychosocial Re-Evaluation     Row Name 01/05/24 1056 01/30/24 0857           Psychosocial Re-Evaluation   Current issues  with None Identified None Identified      Comments Monthly psychosocial re-evaluation is as follows: Dawnmarie has attended 2 sessions so far. She denies any psychosocial barriers or concerns. We will continue to monitor and will assess her needs. Monthly psychosocial re-evaluation is as follows: Danniela continues to deny any psychosocial barriers or concerns. We will continue to monitor and will assess her needs.      Expected Outcomes For Kinslee to particpate in PR without any psychosocial barriers For Jannatul to particpate in PR without any psychosocial barriers      Interventions Encouraged to attend Pulmonary Rehabilitation for the exercise Encouraged to attend Pulmonary Rehabilitation for the exercise      Continue Psychosocial Services  No Follow up required No Follow up required         Psychosocial Discharge (Final Psychosocial Re-Evaluation):  Psychosocial Re-Evaluation - 01/30/24 0857       Psychosocial Re-Evaluation   Current issues with None Identified    Comments Monthly psychosocial re-evaluation is as follows: Beola continues to deny any psychosocial barriers or concerns. We will continue to monitor and will assess her needs.    Expected Outcomes For Mckenna to particpate in PR without any  psychosocial barriers    Interventions Encouraged to attend Pulmonary Rehabilitation for the exercise    Continue Psychosocial Services  No Follow up required          Education: Education Goals: Education classes will be provided on a weekly basis, covering required topics. Participant will state understanding/return demonstration of topics presented.  Learning Barriers/Preferences:  Learning Barriers/Preferences - 12/19/23 1411       Learning Barriers/Preferences   Learning Barriers Sight    Learning Preferences Pictoral;Written Material;Video;Skilled Demonstration          Education Topics: Know Your Numbers Group instruction that is supported by a PowerPoint presentation. Instructor discusses importance of knowing and understanding resting, exercise, and post-exercise oxygen  saturation, heart rate, and blood pressure. Oxygen  saturation, heart rate, blood pressure, rating of perceived exertion, and dyspnea are reviewed along with a normal range for these values.    Exercise for the Pulmonary Patient Group instruction that is supported by a PowerPoint presentation. Instructor discusses benefits of exercise, core components of exercise, frequency, duration, and intensity of an exercise routine, importance of utilizing pulse oximetry during exercise, safety while exercising, and options of places to exercise outside of rehab.    MET Level  Group instruction provided by PowerPoint, verbal discussion, and written material to support subject matter. Instructor reviews what METs are and how to increase METs.    Pulmonary Medications Verbally interactive group education provided by instructor with focus on inhaled medications and proper administration.   Anatomy and Physiology of the Respiratory System Group instruction provided by PowerPoint, verbal discussion, and written material to support subject matter. Instructor reviews respiratory cycle and anatomical components of the  respiratory system and their functions. Instructor also reviews differences in obstructive and restrictive respiratory diseases with examples of each.    Oxygen  Safety Group instruction provided by PowerPoint, verbal discussion, and written material to support subject matter. There is an overview of "What is Oxygen " and "Why do we need it".  Instructor also reviews how to create a safe environment for oxygen  use, the importance of using oxygen  as prescribed, and the risks of noncompliance. There is a brief discussion on traveling with oxygen  and resources the patient may utilize. Flowsheet Row PULMONARY REHAB OTHER RESPIRATORY from 01/01/2024 in [facility]  Center for Heart, Vascular, & Lung Health  Date 01/01/24  Educator RN  Instruction Review Code 1- Verbalizes Understanding    Oxygen  Use Group instruction provided by PowerPoint, verbal discussion, and written material to discuss how supplemental oxygen  is prescribed and different types of oxygen  supply systems. Resources for more information are provided.    Breathing Techniques Group instruction that is supported by demonstration and informational handouts. Instructor discusses the benefits of pursed lip and diaphragmatic breathing and detailed demonstration on how to perform both.     Risk Factor Reduction Group instruction that is supported by a PowerPoint presentation. Instructor discusses the definition of a risk factor, different risk factors for pulmonary disease, and how the heart and lungs work together.   Pulmonary Diseases Group instruction provided by PowerPoint, verbal discussion, and written material to support subject matter. Instructor gives an overview of the different type of pulmonary diseases. There is also a discussion on risk factors and symptoms as well as ways to manage the diseases.   Stress and Energy Conservation Group instruction provided by PowerPoint, verbal discussion, and written material  to support subject matter. Instructor gives an overview of stress and the impact it can have on the body. Instructor also reviews ways to reduce stress. There is also a discussion on energy conservation and ways to conserve energy throughout the day. Flowsheet Row PULMONARY REHAB OTHER RESPIRATORY from 01/22/2024 in [facility] for Heart, Vascular, & Lung Health  Date 01/22/24  Educator EP  Instruction Review Code 1- Verbalizes Understanding    Warning Signs and Symptoms Group instruction provided by PowerPoint, verbal discussion, and written material to support subject matter. Instructor reviews warning signs and symptoms of stroke, heart attack, cold and flu. Instructor also reviews ways to prevent the spread of infection. Flowsheet Row PULMONARY REHAB OTHER RESPIRATORY from 01/29/2024 in [facility] for Heart, Vascular, & Lung Health  Date 01/29/24  Educator RN  Instruction Review Code 1- Verbalizes Understanding    Other Education Group or individual verbal, written, or video instructions that support the educational goals of the pulmonary rehab program.    Knowledge Questionnaire Score:  Knowledge Questionnaire Score - 12/19/23 1424       Knowledge Questionnaire Score   Pre Score 15/18          Core Components/Risk Factors/Patient Goals at Admission:  Personal Goals and Risk Factors at Admission - 12/19/23 1412       Core Components/Risk Factors/Patient Goals on Admission    Weight Management Yes;Weight Loss    Intervention Weight Management: Develop a combined nutrition and exercise program designed to reach desired caloric intake, while maintaining appropriate intake of nutrient and fiber, sodium and fats, and appropriate energy expenditure required for the weight goal.;Weight Management: Provide education and appropriate resources to help participant work on and attain dietary goals.;Weight Management/Obesity: Establish  reasonable short term and long term weight goals.;Obesity: Provide education and appropriate resources to help participant work on and attain dietary goals.    Expected Outcomes Understanding of distribution of calorie intake throughout the day with the consumption of 4-5 meals/snacks;Understanding recommendations for meals to include 15-35% energy as protein, 25-35% energy from fat, 35-60% energy from carbohydrates, less than 200mg  of dietary cholesterol, 20-35 gm of total fiber daily;Weight Loss: Understanding of general recommendations for a balanced deficit meal plan, which promotes 1-2 lb weight loss per week and includes a negative energy balance of [phone] kcal/d;Weight Maintenance: Understanding of the daily nutrition guidelines,  which includes 25-35% calories from fat, 7% or less cal from saturated fats, less than 200mg  cholesterol, less than 1.5gm of sodium, & 5 or more servings of fruits and vegetables daily;Long Term: Adherence to nutrition and physical activity/exercise program aimed toward attainment of established weight goal;Short Term: Continue to assess and modify interventions until short term weight is achieved    Improve shortness of breath with ADL's Yes    Intervention Provide education, individualized exercise plan and daily activity instruction to help decrease symptoms of SOB with activities of daily living.    Expected Outcomes Short Term: Improve cardiorespiratory fitness to achieve a reduction of symptoms when performing ADLs;Long Term: Be able to perform more ADLs without symptoms or delay the onset of symptoms          Core Components/Risk Factors/Patient Goals Review:   Goals and Risk Factor Review     Row Name 01/05/24 1058 01/30/24 0857           Core Components/Risk Factors/Patient Goals Review   Personal Goals Review Improve shortness of breath with ADL's;Develop more efficient breathing techniques such as purse lipped breathing and diaphragmatic breathing and  practicing self-pacing with activity.;Weight Management/Obesity Improve shortness of breath with ADL's;Develop more efficient breathing techniques such as purse lipped breathing and diaphragmatic breathing and practicing self-pacing with activity.;Weight Management/Obesity      Review Monthly review of patient's Core Components/Risk Factors/Patient Goals are as follows: Goal progressing for improving shortness of breath with ADL's. Dalma has attended 2 sessions so far. She started exercising on the NuStep and the seated elliptical. She was able to keep her oxygen  saturation WNLs with exertion. We started working with Freddy on developing more efficient breathing techniques such as purse lipped breathing and diaphragmatic breathing; and practicing self-pacing with activity. Our dietitian has met with Berwyn and assessed her caloric needs. Goal progressing on weight loss while in the program. Unable to assess any weight loss yet as she has only attended 2 sessions. We will continue to monitor Leonila's progress throughout the program. Monthly review of patient's Core Components/Risk Factors/Patient Goals are as follows: Goal progressing for improving shortness of breath with ADL's. Annel is exercising on the NuStep and the seated elliptical. She is able to keep her oxygen  saturation WNLs with exertion. Goal met on developing more efficient breathing techniques such as purse lipped breathing and diaphragmatic breathing; and practicing self-pacing with activity. Ameliarose is able to initiate pursed lip breathing independently. She can slow her pace or stop if needed. Goal progressing on weight loss. Zaylynn recently increased her dose of Zepbound  but has been maintaining her weight. She is working with our dietitian to reduce calories. We will continue to monitor Saidee's progress throughout the program.      Expected Outcomes Pt will show progress toward meeting expected goals and outcomes. Pt will show progress  toward meeting expected goals and outcomes.         Core Components/Risk Factors/Patient Goals at Discharge (Final Review):   Goals and Risk Factor Review - 01/30/24 0857       Core Components/Risk Factors/Patient Goals Review   Personal Goals Review Improve shortness of breath with ADL's;Develop more efficient breathing techniques such as purse lipped breathing and diaphragmatic breathing and practicing self-pacing with activity.;Weight Management/Obesity    Review Monthly review of patient's Core Components/Risk Factors/Patient Goals are as follows: Goal progressing for improving shortness of breath with ADL's. Kodie is exercising on the NuStep and the seated elliptical. She is able to keep  her oxygen  saturation WNLs with exertion. Goal met on developing more efficient breathing techniques such as purse lipped breathing and diaphragmatic breathing; and practicing self-pacing with activity. Reyah is able to initiate pursed lip breathing independently. She can slow her pace or stop if needed. Goal progressing on weight loss. Tecla recently increased her dose of Zepbound  but has been maintaining her weight. She is working with our dietitian to reduce calories. We will continue to monitor Pietra's progress throughout the program.    Expected Outcomes Pt will show progress toward meeting expected goals and outcomes.          ITP Comments: Pt is making expected progress toward Pulmonary Rehab goals after completing 10 session(s). Recommend continued exercise, life style modification, education, and utilization of breathing techniques to increase stamina and strength, while also decreasing shortness of breath with exertion.     Comments: Dr. Slater Staff is Medical Director for Pulmonary Rehab at [facility].

## 2024-02-05 ENCOUNTER — Encounter (HOSPITAL_COMMUNITY)
Admission: RE | Admit: 2024-02-05 | Discharge: 2024-02-05 | Disposition: A | Discharge status: Home / Self Care | Source: Ambulatory Visit | Attending: Pulmonary Disease

## 2024-02-05 DIAGNOSIS — I272 Pulmonary hypertension, unspecified: Secondary | ICD-10-CM | POA: Diagnosis not present

## 2024-02-05 DIAGNOSIS — J984 Other disorders of lung: Secondary | ICD-10-CM

## 2024-02-05 NOTE — Progress Notes (Signed)
 Daily Session Note  Patient Details  Name: Amber Bennett MRN: [id number] Date of Birth: 05-24-1951 Referring Provider:   Conrad Ports Pulmonary Rehab Walk Test from 12/19/2023 in [facility] for Heart, Vascular, & Lung Health  Referring Provider Dr. Lonni Nanas    Encounter Date: 02/05/2024  Check In:  Session Check In - 02/05/24 1429       Check-In   Supervising physician immediately available to respond to emergencies [facility] MD immediately available    Physician(s) Orren Fabry, NP    Location [facility]    Staff Present Johnnie Moats, MS, ACSM-CEP, Exercise Physiologist;Anjalee Cope Harvy, RN, BSN;Casey Claudene, RT;Randi Midge BS, ACSM-CEP, Exercise Physiologist;Bailey Elnor, MS, Exercise Physiologist    Virtual Visit No    Medication changes reported     No    Fall or balance concerns reported    No    Tobacco Cessation No Change    Warm-up and Cool-down Performed as group-led instruction    Resistance Training Performed Yes    VAD Patient? No    PAD/SET Patient? No      Pain Assessment   Currently in Pain? No/denies    Multiple Pain Sites No          Capillary Blood Glucose: No results found for this or any previous visit (from the past 24 hours).    Social History   Tobacco Use  Smoking Status Former   Current packs/day: 1.50   Average packs/day: 1.5 packs/day for 45.6 years (68.4 ttl pk-yrs)   Types: Cigarettes   Start date: 07/01/1978  Smokeless Tobacco Never  Tobacco Comments   Former smoker 04/10/23    Goals Met:  Independence with exercise equipment Exercise tolerated well No report of concerns or symptoms today Strength training completed today  Goals Unmet:  Not Applicable  Comments: Service time is from 1312 to 1449    Dr. Slater Staff is Medical Director for Pulmonary Rehab at [facility]

## 2024-02-09 DIAGNOSIS — J383 Other diseases of vocal cords: Secondary | ICD-10-CM | POA: Diagnosis not present

## 2024-02-09 DIAGNOSIS — F444 Conversion disorder with motor symptom or deficit: Secondary | ICD-10-CM | POA: Diagnosis not present

## 2024-02-09 DIAGNOSIS — J385 Laryngeal spasm: Secondary | ICD-10-CM | POA: Diagnosis not present

## 2024-02-09 DIAGNOSIS — I272 Pulmonary hypertension, unspecified: Secondary | ICD-10-CM | POA: Diagnosis not present

## 2024-02-09 DIAGNOSIS — Z87891 Personal history of nicotine dependence: Secondary | ICD-10-CM | POA: Diagnosis not present

## 2024-02-09 DIAGNOSIS — G4730 Sleep apnea, unspecified: Secondary | ICD-10-CM | POA: Diagnosis not present

## 2024-02-09 DIAGNOSIS — R490 Dysphonia: Secondary | ICD-10-CM | POA: Diagnosis not present

## 2024-02-10 ENCOUNTER — Encounter (HOSPITAL_COMMUNITY)
Admission: RE | Admit: 2024-02-10 | Discharge: 2024-02-10 | Disposition: A | Discharge status: Home / Self Care | Source: Ambulatory Visit | Attending: Pulmonary Disease

## 2024-02-10 DIAGNOSIS — J984 Other disorders of lung: Secondary | ICD-10-CM

## 2024-02-10 DIAGNOSIS — I272 Pulmonary hypertension, unspecified: Secondary | ICD-10-CM

## 2024-02-10 NOTE — Progress Notes (Signed)
 Daily Session Note  Patient Details  Name: Amber Bennett MRN: [id number] Date of Birth: 06/12/1951 Referring Provider:   Conrad Ports Pulmonary Rehab Walk Test from 12/19/2023 in [facility] for Heart, Vascular, & Lung Health  Referring Provider Dr. Lonni Nanas    Encounter Date: 02/10/2024  Check In:  Session Check In - 02/10/24 1459       Check-In   Supervising physician immediately available to respond to emergencies [facility] MD immediately available    Physician(s) Rosabel Mose, NP    Location [facility]    Staff Present Johnnie Moats, MS, ACSM-CEP, Exercise Physiologist;Tudor Chandley Claudene Maya Koyanagi, RN, Avonne Gal, MS, ACSM-CEP, Exercise Physiologist    Virtual Visit No    Medication changes reported     No    Fall or balance concerns reported    No    Tobacco Cessation No Change    Warm-up and Cool-down Performed as group-led instruction    Resistance Training Performed Yes    VAD Patient? No    PAD/SET Patient? No      Pain Assessment   Currently in Pain? No/denies    Multiple Pain Sites No          Capillary Blood Glucose: No results found for this or any previous visit (from the past 24 hours).    Social History   Tobacco Use  Smoking Status Former   Current packs/day: 1.50   Average packs/day: 1.5 packs/day for 45.6 years (68.4 ttl pk-yrs)   Types: Cigarettes   Start date: 07/01/1978  Smokeless Tobacco Never  Tobacco Comments   Former smoker 04/10/23    Goals Met:  Proper associated with RPD/PD & O2 Sat Independence with exercise equipment Exercise tolerated well No report of concerns or symptoms today Strength training completed today  Goals Unmet:  Not Applicable  Comments: Service time is from 1317 to 1431.    Dr. Slater Staff is Medical Director for Pulmonary Rehab at [facility].

## 2024-02-12 ENCOUNTER — Encounter (HOSPITAL_COMMUNITY)
Admission: RE | Admit: 2024-02-12 | Discharge: 2024-02-12 | Disposition: A | Discharge status: Home / Self Care | Source: Ambulatory Visit | Attending: Pulmonary Disease | Admitting: Pulmonary Disease

## 2024-02-12 VITALS — Wt 157.0 lb

## 2024-02-12 DIAGNOSIS — I272 Pulmonary hypertension, unspecified: Secondary | ICD-10-CM

## 2024-02-12 DIAGNOSIS — J984 Other disorders of lung: Secondary | ICD-10-CM

## 2024-02-12 NOTE — Progress Notes (Signed)
 Daily Session Note  Patient Details  Name: Amber Bennett MRN: [id number] Date of Birth: 09-15-1950 Referring Provider:   Conrad Ports [facility] Re[facility] Walk Test from 12/19/2023 in [facility]  Referring Provider Dr. Lonni Nanas    Encounter Date: 02/12/2024  Check In:  Session Check In - 02/12/24 1510       Check-In   Supervising physician immediately available to respond to emergencies [facility] MD immediately available    Physician(s) Damien Braver, NP    Location [facility]    Staff Present Johnnie Moats, MS, ACSM-CEP, Exercise Physiologist;Casey Claudene Candia Levin, RN, BSN;Johnny Porter, MS, Exercise Physiologist    Virtual Visit No    Medication changes reported     No    Fall or balance concerns reported    No    Tobacco Cessation No Change    Warm-up and Cool-down Performed as group-led instruction    Resistance Training Performed Yes    VAD Patient? No    PAD/SET Patient? No      Pain Assessment   Currently in Pain? No/denies    Pain Score 0-No pain    Multiple Pain Sites No          Capillary Blood Glucose: No results found for this or any previous visit (from the past 24 hours).    Social History   Tobacco Use  Smoking Status Former   Current packs/day: 1.50   Average packs/day: 1.5 packs/day for 45.6 years (68.4 ttl pk-yrs)   Types: Cigarettes   Start date: 07/01/1978  Smokeless Tobacco Never  Tobacco Comments   Former smoker 04/10/23    Goals Met:  Proper associated with RPD/PD & O2 Sat Independence with exercise equipment Exercise tolerated well No report of concerns or symptoms today Strength training completed today  Goals Unmet:  Not Applicable  Comments: Service time is from 1307 to 1433.    Dr. Slater Staff is Medical Director for Pulmonary Rehab at [facility].

## 2024-02-14 ENCOUNTER — Other Ambulatory Visit: Payer: Self-pay | Admitting: Adult Health

## 2024-02-14 ENCOUNTER — Other Ambulatory Visit: Payer: Self-pay | Admitting: Internal Medicine

## 2024-02-18 ENCOUNTER — Ambulatory Visit: Admitting: Pulmonary Disease

## 2024-02-23 ENCOUNTER — Telehealth: Payer: Self-pay | Admitting: Cardiovascular Disease

## 2024-02-23 MED ORDER — FUROSEMIDE 40 MG PO TABS
80.0000 mg | ORAL_TABLET | Freq: Every day | ORAL | 3 refills | Status: DC
Start: 2024-02-23 — End: 2024-05-18

## 2024-02-23 NOTE — Telephone Encounter (Signed)
 Pt called to informed that  Pharmacy called Pt to informed that  Pt will need to call Md to see why Medication was not filled for 3 month supply  topiramate  (TOPAMAX ) 50 MG tablet . Informed Pt that Medication was decline due to already having a 90 supply sent out in July.  Pt states she is almost out of medication and need the refill to be 90 days    Pt is also requesting  to have all other medication be filled for 90 day supply   PHENobarbital  (LUMINAL) 97.2 MG table   Pt medication is to be sent to    [id number] - [place], [place] - [address] AT [facility] OF [address] (Ph: [phone])

## 2024-02-23 NOTE — Telephone Encounter (Signed)
Spoke with pt, New script sent to the pharmacy  

## 2024-02-23 NOTE — Telephone Encounter (Signed)
 Pt c/o medication issue:  1. Name of Medication:   furosemide  (LASIX ) 40 MG tablet    2. How are you currently taking this medication (dosage and times per day)?Take 2 tablets (80 mg total) by mouth daily.   3. Are you having a reaction (difficulty breathing--STAT)? No  4. What is your medication issue?Patient went to the pharmacy and received on 90 tablets for a 3 month supply. Patient stated this would be incorrect and the pharmacy can't help because of how the prescription is written. Please advise.

## 2024-02-24 ENCOUNTER — Encounter (HOSPITAL_COMMUNITY)
Admission: RE | Admit: 2024-02-24 | Discharge: 2024-02-24 | Disposition: A | Discharge status: Home / Self Care | Source: Ambulatory Visit | Attending: Pulmonary Disease

## 2024-02-24 ENCOUNTER — Other Ambulatory Visit: Payer: Self-pay | Admitting: *Deleted

## 2024-02-24 DIAGNOSIS — I272 Pulmonary hypertension, unspecified: Secondary | ICD-10-CM | POA: Diagnosis not present

## 2024-02-24 DIAGNOSIS — J984 Other disorders of lung: Secondary | ICD-10-CM | POA: Diagnosis not present

## 2024-02-24 MED ORDER — TOPIRAMATE 50 MG PO TABS: ORAL_TABLET | ORAL | 1 refills | Status: AC

## 2024-02-24 NOTE — Telephone Encounter (Signed)
 LVM made pt aware resent topiramate   refill request to pharmacy this am

## 2024-02-24 NOTE — Progress Notes (Signed)
 Daily Session Note  Patient Details  Name: Amber Bennett MRN: [id number] Date of Birth: 1951-05-28 Referring Provider:   Conrad Ports [facility]monary Rehab Walk Test from 12/19/2023 in [facility]  Referring Provider Dr. Lonni Nanas    Encounter Date: 02/24/2024  Check In:  Session Check In - 02/24/24 1327       Check-In   Supervising physician immediately available to respond to emergencies [facility] MD immediately available    Physician(s) Rosaline Bane, NP    [facility]    Staff Present Johnnie Moats, MS, ACSM-CEP, Exercise Physiologist;Casey Claudene Candia Levin, RN, BSN;Randi Reeve BS, ACSM-CEP, Exercise Physiologist    Virtual Visit No    Medication changes reported     No    Fall or balance concerns reported    No    Tobacco Cessation No Change    Warm-up and Cool-down Performed as group-led instruction    Resistance Training Performed Yes    VAD Patient? No    PAD/SET Patient? No      Pain Assessment   Currently in Pain? No/denies    Pain Score 0-No pain    Multiple Pain Sites No          Capillary Blood Glucose: No results found for this or any previous visit (from the past 24 hours).    Social History   Tobacco Use  Smoking Status Former   Current packs/day: 1.50   Average packs/day: 1.5 packs/day for 45.6 years (68.5 ttl pk-yrs)   Types: Cigarettes   Start date: 07/01/1978  Smokeless Tobacco Never  Tobacco Comments   Former smoker 04/10/23    Goals Met:  Independence with exercise equipment Exercise tolerated well No report of concerns or symptoms today Strength training completed today  Goals Unmet:  Not Applicable  Comments: Service time is from 1313 to 1439    Dr. Slater Staff is Medical Director for Pulmonary Rehab at [facility].

## 2024-02-24 NOTE — Telephone Encounter (Signed)
 Pt called wanting to know when this will be sent in for her. She states she has been out for two days already and she is needing this as soon as possible. Please advise.

## 2024-02-26 ENCOUNTER — Encounter (HOSPITAL_COMMUNITY)
Admission: RE | Admit: 2024-02-26 | Discharge: 2024-02-26 | Disposition: A | Discharge status: Home / Self Care | Source: Ambulatory Visit | Attending: Pulmonary Disease | Admitting: Pulmonary Disease

## 2024-02-26 DIAGNOSIS — I272 Pulmonary hypertension, unspecified: Secondary | ICD-10-CM | POA: Diagnosis not present

## 2024-02-26 DIAGNOSIS — J984 Other disorders of lung: Secondary | ICD-10-CM | POA: Diagnosis not present

## 2024-02-26 NOTE — Progress Notes (Signed)
 Daily Session Note  Patient Details  Name: Amber Bennett MRN: [id number] Date of Birth: 09/15/1950 Referring Provider:   Conrad Ports [facility]monary Rehab Walk Test from 12/19/2023 in [facility]  Referring Provider Dr. Lonni Nanas    Encounter Date: 02/26/2024  Check In:  Session Check In - 02/26/24 1436       Check-In   Supervising physician immediately available to respond to emergencies [facility] MD immediately available    Physician(s) Jackee Alberts, NP    [facility]    Staff Present Johnnie Moats, MS, ACSM-CEP, Exercise Physiologist;Selita Staiger Claudene Candia Levin, RN, BSN;Randi Reeve BS, ACSM-CEP, Exercise Physiologist    Virtual Visit No    Medication changes reported     No    Fall or balance concerns reported    No    Tobacco Cessation No Change    Warm-up and Cool-down Performed as group-led instruction    Resistance Training Performed Yes    VAD Patient? No    PAD/SET Patient? No      Pain Assessment   Currently in Pain? No/denies    Multiple Pain Sites No          Capillary Blood Glucose: No results found for this or any previous visit (from the past 24 hours).    Social History   Tobacco Use  Smoking Status Former   Current packs/day: 1.50   Average packs/day: 1.5 packs/day for 45.7 years (68.5 ttl pk-yrs)   Types: Cigarettes   Start date: 07/01/1978  Smokeless Tobacco Never  Tobacco Comments   Former smoker 04/10/23    Goals Met:  Proper associated with RPD/PD & O2 Sat Independence with exercise equipment Exercise tolerated well No report of concerns or symptoms today Strength training completed today  Goals Unmet:  Not Applicable  Comments: Service time is from 1310 to 1445.    Dr. Slater Staff is Medical Director for Pulmonary Rehab at [facility].

## 2024-03-02 ENCOUNTER — Encounter (HOSPITAL_COMMUNITY)
Admission: RE | Admit: 2024-03-02 | Discharge: 2024-03-02 | Disposition: A | Discharge status: Home / Self Care | Source: Ambulatory Visit | Attending: Pulmonary Disease | Admitting: Pulmonary Disease

## 2024-03-02 DIAGNOSIS — J984 Other disorders of lung: Secondary | ICD-10-CM | POA: Insufficient documentation

## 2024-03-02 DIAGNOSIS — I272 Pulmonary hypertension, unspecified: Secondary | ICD-10-CM | POA: Insufficient documentation

## 2024-03-03 DIAGNOSIS — S22079A Unspecified fracture of T9-T10 vertebra, initial encounter for closed fracture: Secondary | ICD-10-CM | POA: Diagnosis not present

## 2024-03-03 DIAGNOSIS — S32050A Wedge compression fracture of fifth lumbar vertebra, initial encounter for closed fracture: Secondary | ICD-10-CM | POA: Diagnosis not present

## 2024-03-03 DIAGNOSIS — M549 Dorsalgia, unspecified: Secondary | ICD-10-CM | POA: Diagnosis not present

## 2024-03-03 DIAGNOSIS — W19XXXA Unspecified fall, initial encounter: Secondary | ICD-10-CM | POA: Diagnosis not present

## 2024-03-03 DIAGNOSIS — R0781 Pleurodynia: Secondary | ICD-10-CM | POA: Diagnosis not present

## 2024-03-03 DIAGNOSIS — M810 Age-related osteoporosis without current pathological fracture: Secondary | ICD-10-CM | POA: Diagnosis not present

## 2024-03-03 NOTE — Progress Notes (Signed)
 Pulmonary Individual Treatment Plan  Patient Details  Name: Amber Bennett MRN: [id number] Date of Birth: 1950/10/29 Referring Provider:   Conrad Ports Pulmonary Rehab Walk Test from 12/19/2023 in [facility]  Referring Provider Dr. Lonni Nanas    Initial Encounter Date:  Flowsheet Row Pulmonary Rehab Walk Test from 12/19/2023 in [facility]  Date 12/19/23    Visit Diagnosis: Restrictive lung disease  Pulmonary HTN (HCC)  Patient's Home Medications on Admission:  Current Outpatient Medications:    acetaminophen  (TYLENOL ) 500 MG tablet, Take 500 mg by mouth every 6 (six) hours as needed for moderate pain (pain score 4-6) or mild pain (pain score 1-3)., Disp: , Rfl:    Alirocumab  (PRALUENT ) 150 MG/ML SOAJ, Inject 1 mL (150 mg total) into the skin every 14 (fourteen) days., Disp: 6 mL, Rfl: 3   amLODipine  (NORVASC ) 2.5 MG tablet, Take 1 tablet (2.5 mg total) by mouth at bedtime., Disp: 90 tablet, Rfl: 3   apixaban  (ELIQUIS ) 5 MG TABS tablet, Take 1 tablet (5 mg total) by mouth 2 (two) times daily., Disp: 180 tablet, Rfl: 1   Ascorbic Acid (VITAMIN C) 1000 MG tablet, Take 1,000 mg by mouth daily. Airborne emergen c, Disp: , Rfl:    beclomethasone (QVAR  REDIHALER) 80 MCG/ACT inhaler, Inhale 1 puff into the lungs 2 (two) times daily., Disp: 31.8 g, Rfl: 1   Calcium Carb-Cholecalciferol  (CALTRATE 600+D3 PO), Take 1 tablet by mouth daily., Disp: , Rfl:    Cholecalciferol  (VITAMIN D ) 50 MCG (2000 UT) tablet, Take 2,000 Units by mouth daily., Disp: , Rfl:    Coenzyme Q10 (CO Q 10 PO), Take 200 mg by mouth daily., Disp: , Rfl:    colestipol  (COLESTID ) 1 g tablet, TAKE 2 TABLETS(2 GRAMS) BY MOUTH AT BEDTIME, Disp: 180 tablet, Rfl: 0   cyclobenzaprine  (FLEXERIL ) 5 MG tablet, Take 1 tablet (5 mg total) by mouth every 8 (eight) hours as needed., Disp: 30 tablet, Rfl: 1    diphenoxylate -atropine  (LOMOTIL ) 2.5-0.025 MG tablet, Take 1-2 tablets by mouth every 4 hours as needed for diarrhea/loose stools, Disp: 90 tablet, Rfl: 1   DULoxetine  (CYMBALTA ) 30 MG capsule, Take 1 capsule (30 mg total) by mouth every evening., Disp: 90 capsule, Rfl: 4   ezetimibe  (ZETIA ) 10 MG tablet, Take 10 mg by mouth every evening., Disp: , Rfl:    furosemide  (LASIX ) 40 MG tablet, Take 2 tablets (80 mg total) by mouth daily., Disp: 180 tablet, Rfl: 3   gabapentin  (NEURONTIN ) 800 MG tablet, Take 1 tablet (800 mg total) by mouth 2 (two) times daily., Disp: 180 tablet, Rfl: 3   ibandronate (BONIVA) 150 MG tablet, Take 150 mg by mouth every 30 (thirty) days., Disp: , Rfl:    ipratropium (ATROVENT ) 0.03 % nasal spray, Place 2 sprays into both nostrils every 12 (twelve) hours., Disp: 30 mL, Rfl: 4   ipratropium-albuterol  (DUONEB) 0.5-2.5 (3) MG/3ML SOLN, Take 3 mLs by nebulization 3 (three) times daily as needed., Disp: 360 mL, Rfl: 5   loratadine  (CLARITIN ) 10 MG tablet, Take 1 tablet (10 mg total) by mouth daily as needed for allergies (Can take an extra dose during flare ups.)., Disp: 90 tablet, Rfl: 1   metoprolol  tartrate (LOPRESSOR ) 50 MG tablet, Take 1 tablet (50 mg total) by mouth 2 (two) times daily., Disp: , Rfl:    mometasone  (ASMANEX ) 220 MCG/ACT inhaler, Inhale 1 puff into the  lungs 2 (two) times daily., Disp: 3 each, Rfl: 1   Multiple Vitamins-Minerals (MULTIVITAMIN PO), Take 1 tablet by mouth daily., Disp: , Rfl:    Multiple Vitamins-Minerals (PRESERVISION AREDS PO), Take 1 capsule by mouth daily., Disp: , Rfl:    Olopatadine-Mometasone  (RYALTRIS ) 665-25 MCG/ACT SUSP, Place 2 sprays into the nose 2 (two) times daily., Disp: 87 g, Rfl: 1   omeprazole (PRILOSEC) 20 MG capsule, Take 20 mg by mouth 2 (two) times daily before a meal., Disp: , Rfl: 3   PHENobarbital  (LUMINAL) 97.2 MG tablet, Take 1.5 tablets (145.8 mg total) by mouth daily., Disp: 135 tablet, Rfl: 2   potassium chloride  SA  (KLOR-CON  M) 20 MEQ tablet, While taking 40 mg of Furosemide , take Potassium Chloride  20 meq twice a day. When you decrease Furosemide  to 20 mg, decrease Potassium Chloride  to 20 meq once a day., Disp: 180 tablet, Rfl: 3   PRALUENT  150 MG/ML SOAJ, Inject 150 mg into the skin every 14 (fourteen) days., Disp: , Rfl:    pyridoxine  (B-6) 100 MG tablet, Take 100 mg by mouth daily., Disp: , Rfl:    sacubitril-valsartan (ENTRESTO ) 49-51 MG, TAKE 1 TABLET BY MOUTH TWICE DAILY, Disp: 180 tablet, Rfl: 2   topiramate  (TOPAMAX ) 50 MG tablet, TAKE 1 TABLET BY MOUTH IN THE MORNIGN& 2 TABS EVERY EVENING, Disp: 270 tablet, Rfl: 1   vitamin E 400 UNIT capsule, Take 400 Units by mouth daily., Disp: , Rfl:    ZEPBOUND  2.5 MG/0.5ML Pen, Inject 2.5 mg into the skin once a week. 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   Carpal tunnel syndrome    Cervical radiculitis    Chronic diarrhea 10/24/2023   Collar bone fracture    Depression    Dermatomyositis (HCC)    Diabetes mellitus without complication (HCC)    Dysrhythmia    Elevated liver enzymes    External hemorrhoids    Fatty liver    Fibromyalgia    Fibromyalgia affecting hand 08/2022   bilateral wrist and hands   Hematuria    negative evaluation   High cholesterol    HTN (hypertension)    Internal hemorrhoids    Laryngitis    Lower back pain    Macular degeneration    Nonalcoholic steatohepatitis (NASH)    Nonalcoholic steatohepatitis (NASH)    Osteopenia    Left side hip   Seizures (HCC)    09/07/2019 pt reports last seizure in 2014   Tubular adenoma of colon     Tobacco Use: Social History   Tobacco Use  Smoking Status Former   Current packs/day: 1.50   Average packs/day: 1.5 packs/day for 45.7 years (68.5 ttl pk-yrs)   Types:  Cigarettes   Start date: 07/01/1978  Smokeless Tobacco Never  Tobacco Comments   Former smoker 04/10/23    Labs: Review Flowsheet       Latest Ref Rng & Units 02/05/2022 11/27/2023  Labs for ITP Cardiac and Pulmonary Rehab  Hemoglobin A1c 4.8 - 5.6 % 4.9  -  Bicarbonate 20.0 - 28.0 mmol/L - 21.7  23.0  22.1   TCO2 22 - 32 mmol/L - 23  24  23    Acid-base deficit 0.0 - 2.0 mmol/L - 3.0  2.0  3.0   O2 Saturation % - 79  78  78     Details       Multiple values from one day are sorted in reverse-chronological order          Pulmonary Assessment Scores:  Pulmonary Assessment Scores     Row Name 12/19/23 1416         ADL UCSD   ADL Phase Entry     SOB Score total 28       CAT Score   CAT Score 18       mMRC Score   mMRC Score 0        UCSD: Self-administered rating of dyspnea associated with activities of daily living (ADLs) 6-point scale (0 = not at all to 5 = maximal or unable to do because of breathlessness)  Scoring Scores range from 0 to 120.  Minimally important difference is 5 units  CAT: CAT can identify the health impairment of COPD patients and is better correlated with disease progression.  CAT has a scoring range of zero to 40. The CAT score is classified into four groups of low (less than 10), medium (10 - 20), high (21-30) and very high (31-40) based on the impact level of disease on health status. A CAT score over 10 suggests significant symptoms.  A worsening CAT score could be explained by an exacerbation, poor medication adherence, poor inhaler technique, or progression of COPD or comorbid conditions.  CAT MCID is 2 points  mMRC: mMRC (Modified Medical Research Council) Dyspnea Scale is used to assess the degree of baseline functional disability in patients of respiratory disease due to dyspnea. No minimal important difference is established. A decrease in score of 1 point or greater is considered a positive change.   Pulmonary Function  Assessment:  Pulmonary Function Assessment - 12/19/23 1415       Breath   Shortness of Breath Yes;Limiting activity          Exercise Target Goals: Exercise Program Goal: Individual exercise prescription set using results from initial 6 min walk test and THRR while considering  patient's activity barriers and safety.   Exercise Prescription Goal: Initial exercise prescription builds to 30-45 minutes a day of aerobic activity, 2-3 days per week.  Home exercise guidelines will be given to patient during program as part of exercise prescription that the participant will acknowledge.  Education: Aerobic Exercise: - Group verbal and visual presentation on the components of exercise prescription. Introduces F.I.T.T principle from ACSM for exercise prescriptions.  Reviews F.I.T.T. principles of aerobic exercise including progression. Written material provided at class time.   Education: Resistance Exercise: - Group verbal and visual presentation on the components of exercise prescription. Introduces F.I.T.T principle from ACSM for exercise prescriptions  Reviews F.I.T.T. principles of resistance exercise including progression. Written material provided at class time.    Education: Exercise & Equipment Safety: - Individual verbal instruction and demonstration of equipment use and safety with use of the equipment.   Education: Exercise Physiology & General Exercise Guidelines: - Group verbal and written instruction with models to review the exercise physiology of the cardiovascular system and associated critical values. Provides general exercise guidelines with specific guidelines to those with heart or lung disease.    Education: Flexibility, Balance, Mind/Body Relaxation: - Group verbal and visual presentation with interactive activity on the components of exercise prescription. Introduces F.I.T.T principle from ACSM for exercise prescriptions. Reviews F.I.T.T. principles of flexibility  and balance exercise training  including progression. Also discusses the mind body connection.  Reviews various relaxation techniques to help reduce and manage stress (i.e. Deep breathing, progressive muscle relaxation, and visualization). Balance handout provided to take home. Written material provided at class time.   Activity Barriers & Risk Stratification:  Activity Barriers & Cardiac Risk Stratification - 12/19/23 1416       Activity Barriers & Cardiac Risk Stratification   Activity Barriers Back Problems;Neck/Spine Problems;Right Hip Replacement;Balance Concerns;Arthritis;Joint Problems;History of Falls          6 Minute Walk:  6 Minute Walk     Row Name 12/19/23 1522         6 Minute Walk   Phase Initial     Distance 872 feet     Walk Time 6 minutes     # of Rest Breaks 0     MPH 1.65     METS 1.59     RPE 11     Perceived Dyspnea  1     VO2 Peak 5.55     Symptoms No     Resting HR 83 bpm     Resting BP 126/62     Resting Oxygen  Saturation  98 %     Exercise Oxygen  Saturation  during 6 min walk 96 %     Max Ex. HR 100 bpm     Max Ex. BP 146/62     2 Minute Post BP 130/60       Interval HR   1 Minute HR 90     2 Minute HR 94     3 Minute HR 98     4 Minute HR 100     5 Minute HR 98     6 Minute HR 98     2 Minute Post HR 84     Interval Heart Rate? Yes       Interval Oxygen    Interval Oxygen ? Yes     Baseline Oxygen  Saturation % 98 %     1 Minute Oxygen  Saturation % 98 %     1 Minute Liters of Oxygen  0 L     2 Minute Oxygen  Saturation % 96 %     2 Minute Liters of Oxygen  0 L     3 Minute Oxygen  Saturation % 97 %     3 Minute Liters of Oxygen  0 L     4 Minute Oxygen  Saturation % 98 %     4 Minute Liters of Oxygen  0 L     5 Minute Oxygen  Saturation % 98 %     5 Minute Liters of Oxygen  0 L     6 Minute Oxygen  Saturation % 97 %     6 Minute Liters of Oxygen  0 L     2 Minute Post Oxygen  Saturation % 99 %     2 Minute Post Liters of Oxygen  0 L        Oxygen  Initial Assessment:  Oxygen  Initial Assessment - 12/19/23 1414       Home Oxygen    Home Oxygen  Device None    Sleep Oxygen  Prescription CPAP    Liters per minute 3    Home Exercise Oxygen  Prescription None    Home Resting Oxygen  Prescription None    Compliance with Home Oxygen  Use Yes      Initial 6 min Walk   Oxygen  Used None      Program Oxygen  Prescription   Program Oxygen  Prescription None  Intervention   Short Term Goals To learn and exhibit compliance with exercise, home and travel O2 prescription;To learn and understand importance of maintaining oxygen  saturations>88%;To learn and demonstrate proper use of respiratory medications;To learn and understand importance of monitoring SPO2 with pulse oximeter and demonstrate accurate use of the pulse oximeter.;To learn and demonstrate proper pursed lip breathing techniques or other breathing techniques.     Long  Term Goals Verbalizes importance of monitoring SPO2 with pulse oximeter and return demonstration;Exhibits compliance with exercise, home  and travel O2 prescription;Maintenance of O2 saturations>88%;Exhibits proper breathing techniques, such as pursed lip breathing or other method taught during program session;Compliance with respiratory medication;Demonstrates proper use of MDI's          Oxygen  Re-Evaluation:  Oxygen  Re-Evaluation     Row Name 01/01/24 0849 02/03/24 0852 02/26/24 0910         Program Oxygen  Prescription   Program Oxygen  Prescription None None None       Home Oxygen    Home Oxygen  Device None None None     Sleep Oxygen  Prescription CPAP CPAP CPAP     Liters per minute 3 3 3      Home Exercise Oxygen  Prescription None None None     Home Resting Oxygen  Prescription None None None     Compliance with Home Oxygen  Use Yes Yes Yes       Goals/Expected Outcomes   Short Term Goals To learn and exhibit compliance with exercise, home and travel O2 prescription;To learn and understand  importance of maintaining oxygen  saturations>88%;To learn and demonstrate proper use of respiratory medications;To learn and understand importance of monitoring SPO2 with pulse oximeter and demonstrate accurate use of the pulse oximeter.;To learn and demonstrate proper pursed lip breathing techniques or other breathing techniques.  To learn and exhibit compliance with exercise, home and travel O2 prescription;To learn and understand importance of maintaining oxygen  saturations>88%;To learn and demonstrate proper use of respiratory medications;To learn and understand importance of monitoring SPO2 with pulse oximeter and demonstrate accurate use of the pulse oximeter.;To learn and demonstrate proper pursed lip breathing techniques or other breathing techniques.  To learn and exhibit compliance with exercise, home and travel O2 prescription;To learn and understand importance of maintaining oxygen  saturations>88%;To learn and demonstrate proper use of respiratory medications;To learn and understand importance of monitoring SPO2 with pulse oximeter and demonstrate accurate use of the pulse oximeter.;To learn and demonstrate proper pursed lip breathing techniques or other breathing techniques.      Long  Term Goals Verbalizes importance of monitoring SPO2 with pulse oximeter and return demonstration;Exhibits compliance with exercise, home  and travel O2 prescription;Maintenance of O2 saturations>88%;Exhibits proper breathing techniques, such as pursed lip breathing or other method taught during program session;Compliance with respiratory medication;Demonstrates proper use of MDI's Verbalizes importance of monitoring SPO2 with pulse oximeter and return demonstration;Exhibits compliance with exercise, home  and travel O2 prescription;Maintenance of O2 saturations>88%;Exhibits proper breathing techniques, such as pursed lip breathing or other method taught during program session;Compliance with respiratory  medication;Demonstrates proper use of MDI's Verbalizes importance of monitoring SPO2 with pulse oximeter and return demonstration;Exhibits compliance with exercise, home  and travel O2 prescription;Maintenance of O2 saturations>88%;Exhibits proper breathing techniques, such as pursed lip breathing or other method taught during program session;Compliance with respiratory medication;Demonstrates proper use of MDI's     Goals/Expected Outcomes Compliance and understanding of oxygen  saturation monitoring and breathing techniques to decrease shortness of breath. Compliance and understanding of oxygen  saturation monitoring and breathing techniques to decrease  shortness of breath. Compliance and understanding of oxygen  saturation monitoring and breathing techniques to decrease shortness of breath.        Oxygen  Discharge (Final Oxygen  Re-Evaluation):  Oxygen  Re-Evaluation - 02/26/24 0910       Program Oxygen  Prescription   Program Oxygen  Prescription None      Home Oxygen    Home Oxygen  Device None    Sleep Oxygen  Prescription CPAP    Liters per minute 3    Home Exercise Oxygen  Prescription None    Home Resting Oxygen  Prescription None    Compliance with Home Oxygen  Use Yes      Goals/Expected Outcomes   Short Term Goals To learn and exhibit compliance with exercise, home and travel O2 prescription;To learn and understand importance of maintaining oxygen  saturations>88%;To learn and demonstrate proper use of respiratory medications;To learn and understand importance of monitoring SPO2 with pulse oximeter and demonstrate accurate use of the pulse oximeter.;To learn and demonstrate proper pursed lip breathing techniques or other breathing techniques.     Long  Term Goals Verbalizes importance of monitoring SPO2 with pulse oximeter and return demonstration;Exhibits compliance with exercise, home  and travel O2 prescription;Maintenance of O2 saturations>88%;Exhibits proper breathing techniques, such as  pursed lip breathing or other method taught during program session;Compliance with respiratory medication;Demonstrates proper use of MDI's    Goals/Expected Outcomes Compliance and understanding of oxygen  saturation monitoring and breathing techniques to decrease shortness of breath.          Initial Exercise Prescription:  Initial Exercise Prescription - 12/19/23 1500       Date of Initial Exercise RX and Referring Provider   Date 12/19/23    Referring Provider Dr. Lonni Nanas    Expected Discharge Date 03/18/24      NuStep   Level 2    SPM 60    Minutes 15    METs 1.1      Recumbant Elliptical   Level 1    RPM 26    Watts 12    Minutes 15    METs 1.2      Prescription Details   Frequency (times per week) 2    Duration Progress to 30 minutes of continuous aerobic without signs/symptoms of physical distress      Intensity   THRR 40-80% of Max Heartrate 58-116    Ratings of Perceived Exertion 11-13    Perceived Dyspnea 0-4      Progression   Progression Continue to progress workloads to maintain intensity without signs/symptoms of physical distress.      Resistance Training   Training Prescription Yes    Weight red bands    Reps 10-15          Perform Capillary Blood Glucose checks as needed.  Exercise Prescription Changes:   Exercise Prescription Changes     Row Name 01/06/24 1400 01/20/24 1500 02/03/24 1500 02/12/24 0936 02/26/24 1430     Response to Exercise   Blood Pressure (Admit) 114/58 128/54 110/64 120/60 122/66   Blood Pressure (Exercise) 118/58 116/64 126/56 -- --   Blood Pressure (Exit) 114/60 122/68 90/50  asymptomatic 122/64 104/62   Heart Rate (Admit) 76 bpm 89 bpm 76 bpm 76 bpm 75 bpm   Heart Rate (Exercise) 79 bpm 95 bpm 90 bpm 92 bpm 93 bpm   Heart Rate (Exit) 68 bpm 79 bpm 82 bpm 76 bpm 78 bpm   Oxygen  Saturation (Admit) 95 % 97 % 98 % 97 % 97 %   Oxygen   Saturation (Exercise) 96 % 95 % 96 % 94 % 94 %   Oxygen  Saturation  (Exit) 98 % 97 % 96 % 98 % 97 %   Rating of Perceived Exertion (Exercise) 11 13 14 13 11    Perceived Dyspnea (Exercise) 1 2 3 2 1    Duration Continue with 30 min of aerobic exercise without signs/symptoms of physical distress. Continue with 30 min of aerobic exercise without signs/symptoms of physical distress. Continue with 30 min of aerobic exercise without signs/symptoms of physical distress. Continue with 30 min of aerobic exercise without signs/symptoms of physical distress. Continue with 30 min of aerobic exercise without signs/symptoms of physical distress.   Intensity THRR unchanged THRR unchanged THRR unchanged THRR unchanged THRR unchanged     Progression   Progression Continue to progress workloads to maintain intensity without signs/symptoms of physical distress. Continue to progress workloads to maintain intensity without signs/symptoms of physical distress. Continue to progress workloads to maintain intensity without signs/symptoms of physical distress. Continue to progress workloads to maintain intensity without signs/symptoms of physical distress. Continue to progress workloads to maintain intensity without signs/symptoms of physical distress.     Resistance Training   Training Prescription Yes Yes Yes Yes Yes   Weight red bands red bands blue bands blue bands blue bands   Reps 10-15 10-15 10-15 10-15 10-15   Time 10 Minutes 10 Minutes 10 Minutes 10 Minutes 10 Minutes     NuStep   Level 2 2 3 3 3    SPM 98 -- 100 -- 102   Minutes 15 15 15 15 15    METs 2.5 2.9 2.4 2.9 2.7     Recumbant Elliptical   Level 2 2 3 3 3    RPM -- -- 45 -- --   Watts -- -- 53 -- --   Minutes 15 15 15 15 15    METs -- 3.7 2.8 3.1 2.8      Exercise Comments:   Exercise Comments     Row Name 12/30/23 1522           Exercise Comments Pt completed first day of group exercise. She exercised on the recumbent stepper for 15 min, level 2, METs 2.7. She then exercised on the recumbent elliptical for  15 min, level 1, METs around 3. She needed occasionally rest breaks due to a groin muscle that tenses with exercise. She performed warm up and cool down standing, including squats. Discussed METs with fair reception.          Exercise Goals and Review:   Exercise Goals     Row Name 12/19/23 1417             Exercise Goals   Increase Physical Activity Yes       Intervention Provide advice, education, support and counseling about physical activity/exercise needs.;Develop an individualized exercise prescription for aerobic and resistive training based on initial evaluation findings, risk stratification, comorbidities and participant's personal goals.       Expected Outcomes Short Term: Attend rehab on a regular basis to increase amount of physical activity.;Long Term: Add in home exercise to make exercise part of routine and to increase amount of physical activity.;Long Term: Exercising regularly at least 3-5 days a week.       Increase Strength and Stamina Yes       Intervention Provide advice, education, support and counseling about physical activity/exercise needs.;Develop an individualized exercise prescription for aerobic and resistive training based on initial evaluation findings, risk stratification, comorbidities and  participant's personal goals.       Expected Outcomes Short Term: Increase workloads from initial exercise prescription for resistance, speed, and METs.;Short Term: Perform resistance training exercises routinely during rehab and add in resistance training at home;Long Term: Improve cardiorespiratory fitness, muscular endurance and strength as measured by increased METs and functional capacity ( )       Able to understand and use rate of perceived exertion (RPE) scale Yes       Intervention Provide education and explanation on how to use RPE scale       Expected Outcomes Short Term: Able to use RPE daily in rehab to express subjective intensity level;Long Term:  Able to  use RPE to guide intensity level when exercising independently       Able to understand and use Dyspnea scale Yes       Intervention Provide education and explanation on how to use Dyspnea scale       Expected Outcomes Short Term: Able to use Dyspnea scale daily in rehab to express subjective sense of shortness of breath during exertion;Long Term: Able to use Dyspnea scale to guide intensity level when exercising independently       Knowledge and understanding of Target Heart Rate Range (THRR) Yes       Intervention Provide education and explanation of THRR including how the numbers were predicted and where they are located for reference       Expected Outcomes Short Term: Able to state/look up THRR;Long Term: Able to use THRR to govern intensity when exercising independently;Short Term: Able to use daily as guideline for intensity in rehab       Understanding of Exercise Prescription Yes       Intervention Provide education, explanation, and written materials on patient's individual exercise prescription       Expected Outcomes Short Term: Able to explain program exercise prescription;Long Term: Able to explain home exercise prescription to exercise independently          Exercise Goals Re-Evaluation :  Exercise Goals Re-Evaluation     Row Name 01/01/24 0849 02/03/24 0848 02/26/24 0908         Exercise Goal Re-Evaluation   Exercise Goals Review Increase Physical Activity;Able to understand and use Dyspnea scale;Understanding of Exercise Prescription;Increase Strength and Stamina;Knowledge and understanding of Target Heart Rate Range (THRR);Able to understand and use rate of perceived exertion (RPE) scale Increase Physical Activity;Able to understand and use Dyspnea scale;Understanding of Exercise Prescription;Increase Strength and Stamina;Knowledge and understanding of Target Heart Rate Range (THRR);Able to understand and use rate of perceived exertion (RPE) scale Increase Physical  Activity;Able to understand and use Dyspnea scale;Understanding of Exercise Prescription;Increase Strength and Stamina;Knowledge and understanding of Target Heart Rate Range (THRR);Able to understand and use rate of perceived exertion (RPE) scale     Comments Pt completed one exercise session. She exercised on the recumbent stepper for 15 min, level 2, METs 2.7. She then exercised on the recumbent elliptical for 15 min, level 1, METs approximately 3. She pushed herself but also has to take breaks due to hip/groin cramps. She is motivated. Performed warm up and cool down without significant limitations. Using blue bands, 5.8 lbs. Will progress as tolerated. Pt has completed 9 exercise session. She is exercising on the recumbent stepper for 15 min, level 2, METs 2.9. She then is exercising on the recumbent elliptical for 15 min, level 2, METs approximately 3.5. She has mostly overcome her back/hip pain with time. Will now increase level. Performs  warm up and cool down without significant limitations. Using blue bands, 5.8 lbs. Will progress as tolerated. Pt has completed 14 exercise session, missing a few for vacation. She is exercising on the recumbent stepper for 15 min, level 3, METs 3. She then is exercising on the recumbent elliptical for 15 min, level 3, METs approximately 3.6. She has been able to increase her functional ability and was happy to walk on vacation up hills without rest stops.   Using blue bands, 5.8 lbs. Will progress as tolerated.     Expected Outcomes Through exercise at rehab and home, the patient will decrease shortness of breath and feel confident in carrying out an exercise regimen at home. Through exercise at rehab and home, the patient will decrease shortness of breath and feel confident in carrying out an exercise regimen at home. Through exercise at rehab and home, the patient will decrease shortness of breath and feel confident in carrying out an exercise regimen at home.         Discharge Exercise Prescription (Final Exercise Prescription Changes):  Exercise Prescription Changes - 02/26/24 1430       Response to Exercise   Blood Pressure (Admit) 122/66    Blood Pressure (Exit) 104/62    Heart Rate (Admit) 75 bpm    Heart Rate (Exercise) 93 bpm    Heart Rate (Exit) 78 bpm    Oxygen  Saturation (Admit) 97 %    Oxygen  Saturation (Exercise) 94 %    Oxygen  Saturation (Exit) 97 %    Rating of Perceived Exertion (Exercise) 11    Perceived Dyspnea (Exercise) 1    Duration Continue with 30 min of aerobic exercise without signs/symptoms of physical distress.    Intensity THRR unchanged      Progression   Progression Continue to progress workloads to maintain intensity without signs/symptoms of physical distress.      Resistance Training   Training Prescription Yes    Weight blue bands    Reps 10-15    Time 10 Minutes      NuStep   Level 3    SPM 102    Minutes 15    METs 2.7      Recumbant Elliptical   Level 3    Minutes 15    METs 2.8          Nutrition:  Target Goals: Understanding of nutrition guidelines, daily intake of sodium 1500mg , cholesterol 200mg , calories 30% from fat and 7% or less from saturated fats, daily to have 5 or more servings of fruits and vegetables.  Education: Nutrition 1 -Group instruction provided by verbal, written material, interactive activities, discussions, models, and posters to present general guidelines for heart healthy nutrition including macronutrients, label reading, and promoting whole foods over processed counterparts. Education serves as Pensions consultant of discussion of heart healthy eating for all. Written material provided at class time.     Education: Nutrition 2 -Group instruction provided by verbal, written material, interactive activities, discussions, models, and posters to present general guidelines for heart healthy nutrition including sodium, cholesterol, and saturated fat. Providing guidance of  habit forming to improve blood pressure, cholesterol, and body weight. Written material provided at class time.     Biometrics:    Nutrition Therapy Plan and Nutrition Goals:  Nutrition Therapy & Goals - 01/29/24 1354       Nutrition Therapy   Diet General healthy diet      Personal Nutrition Goals   Nutrition Goal Patient to improve diet  quality by using the plate method as a guide for meal planning to include lean protein/plant protein, fruits, vegetables, whole grains, nonfat dairy as part of a well-balanced diet.   goal in action.   Personal Goal #2 Patient to identify strategies for weight loss of 0.5-2.0# per week.   goal not met.   Comments Goals in progress. Hoorain has medical history restrictive lung disease, pulmonary HTN, heart failure, NASH, OSA. She is taking zepbound  for weight loss and has started following the Mediterranean diet. Zepbound  was recently increased to 10mg  and she has maintained her weight since starting the medication. Have discussed multiple strategies for weight loss including calorie density, the plate method as a guide for meal planning, protein supplements, etc.  Patient will benefit from participation in pulmonary  rehab for nutrition education, exercise, and lifestyle modification.      Intervention Plan   Intervention Prescribe, educate and counsel regarding individualized specific dietary modifications aiming towards targeted core components such as weight, hypertension, lipid management, diabetes, heart failure and other comorbidities.;Nutrition handout(s) given to patient.    Expected Outcomes Short Term Goal: Understand basic principles of dietary content, such as calories, fat, sodium, cholesterol and nutrients.;Long Term Goal: Adherence to prescribed nutrition plan.          Nutrition Assessments:  Nutrition Assessments - 12/30/23 1521       Rate Your Plate Scores   Pre Score 74         MEDIFICTS Score Key: >=70 Need to make dietary  changes  40-70 Heart Healthy Diet <= 40 Therapeutic Level Cholesterol Diet  Flowsheet Row PULMONARY REHAB OTHER RESPIRATORY from 12/30/2023 in [facility]  Picture Your Plate Total Score on Admission 74   Picture Your Plate Scores: <59 Unhealthy dietary pattern with much room for improvement. 41-50 Dietary pattern unlikely to meet recommendations for good health and room for improvement. 51-60 More healthful dietary pattern, with some room for improvement.  >60 Healthy dietary pattern, although there may be some specific behaviors that could be improved.   Nutrition Goals Re-Evaluation:  Nutrition Goals Re-Evaluation     Row Name 01/01/24 1455 01/29/24 1354           Goals   Current Weight 160 lb 11.5 oz (72.9 kg) 160 lb 0.9 oz (72.6 kg)      Comment A1c WNL A1c WNL      Expected Outcome Oris has medical history restrictive lung disease, pulmonary HTN, heart failure, NASH, OSA. She is taking zepbound  for weight loss and has started following the Mediterranean diet. She has maintained her weight since starting the medication. Will continue to educate on strategies for weight loss including calorie density, the plate method as a guide for meal planning, etc. Patient will benefit from participation in pulmonary rehab for nutrition education, exercise, and lifestyle modification. Goals in progress. Gregg has medical history restrictive lung disease, pulmonary HTN, heart failure, NASH, OSA. She is taking zepbound  for weight loss and has started following the Mediterranean diet. Zepbound  was recently increased to 10mg  and she has maintained her weight since starting the medication. Have discussed multiple strategies for weight loss including calorie density, the plate method as a guide for meal planning, protein supplements, etc. Patient will benefit from participation in pulmonary rehab for nutrition education, exercise, and lifestyle  modification.         Nutrition Goals Discharge (Final Nutrition Goals Re-Evaluation):  Nutrition Goals Re-Evaluation - 01/29/24 1354  Goals   Current Weight 160 lb 0.9 oz (72.6 kg)    Comment A1c WNL    Expected Outcome Goals in progress. Dilynn has medical history restrictive lung disease, pulmonary HTN, heart failure, NASH, OSA. She is taking zepbound  for weight loss and has started following the Mediterranean diet. Zepbound  was recently increased to 10mg  and she has maintained her weight since starting the medication. Have discussed multiple strategies for weight loss including calorie density, the plate method as a guide for meal planning, protein supplements, etc. Patient will benefit from participation in pulmonary rehab for nutrition education, exercise, and lifestyle modification.          Psychosocial: Target Goals: Acknowledge presence or absence of significant depression and/or stress, maximize coping skills, provide positive support system. Participant is able to verbalize types and ability to use techniques and skills needed for reducing stress and depression.   Education: Stress, Anxiety, and Depression - Group verbal and visual presentation to define topics covered.  Reviews how body is impacted by stress, anxiety, and depression.  Also discusses healthy ways to reduce stress and to treat/manage anxiety and depression.  Written material provided at class time.   Education: Sleep Hygiene -Provides group verbal and written instruction about how sleep can affect your health.  Define sleep hygiene, discuss sleep cycles and impact of sleep habits. Review good sleep hygiene tips.    Initial Review & Psychosocial Screening:  Initial Psych Review & Screening - 12/19/23 1410       Initial Review   Current issues with None Identified      Family Dynamics   Good Support System? Yes    Comments Val's son and spouse      Barriers   Psychosocial barriers to participate  in program There are no identifiable barriers or psychosocial needs.      Screening Interventions   Interventions Encouraged to exercise          Quality of Life Scores:  Scores of 19 and below usually indicate a poorer quality of life in these areas.  A difference of  2-3 points is a clinically meaningful difference.  A difference of 2-3 points in the total score of the Quality of Life Index has been associated with significant improvement in overall quality of life, self-image, physical symptoms, and general health in studies assessing change in quality of life.  PHQ-9: Review Flowsheet       12/19/2023 06/18/2016  Depression screen PHQ 2/9  Decreased Interest 0 0  Down, Depressed, Hopeless 0 0  PHQ - 2 Score 0 0  Altered sleeping 0 0  Tired, decreased energy 1 0  Change in appetite 0 0  Feeling bad or failure about yourself  0 0  Trouble concentrating 0 0  Moving slowly or fidgety/restless 0 0  Suicidal thoughts 0 0   PHQ-9 Score 1 0  Difficult doing work/chores Somewhat difficult -    Details       Data saved with a previous flowsheet row definition        Interpretation of Total Score  Total Score Depression Severity:  1-4 = Minimal depression, 5-9 = Mild depression, 10-14 = Moderate depression, 15-19 = Moderately severe depression, 20-27 = Severe depression   Psychosocial Evaluation and Intervention:  Psychosocial Evaluation - 12/19/23 1411       Psychosocial Evaluation & Interventions   Interventions Encouraged to exercise with the program and follow exercise prescription    Comments Karys denies any psychosocial barriers at  this time.    Expected Outcomes For Tisa to particpate in PR without any psychosocial barriers    Continue Psychosocial Services  No Follow up required          Psychosocial Re-Evaluation:  Psychosocial Re-Evaluation     Row Name 01/05/24 1056 01/30/24 0857 02/27/24 1118         Psychosocial Re-Evaluation   Current  issues with None Identified None Identified None Identified     Comments Monthly psychosocial re-evaluation is as follows: Triva has attended 2 sessions so far. She denies any psychosocial barriers or concerns. We will continue to monitor and will assess her needs. Monthly psychosocial re-evaluation is as follows: Zollie continues to deny any psychosocial barriers or concerns. We will continue to monitor and will assess her needs. Monthly psychosocial re-evaluation is as follows: Infantof continues to deny any psychosocial barriers or concerns. We will continue to monitor and assess her needs.     Expected Outcomes For Annasofia to particpate in PR without any psychosocial barriers For Yoltzin to particpate in PR without any psychosocial barriers For Melainie to particpate in PR without any psychosocial barriers     Interventions Encouraged to attend Pulmonary Rehabilitation for the exercise Encouraged to attend Pulmonary Rehabilitation for the exercise Encouraged to attend Pulmonary Rehabilitation for the exercise     Continue Psychosocial Services  No Follow up required No Follow up required No Follow up required        Psychosocial Discharge (Final Psychosocial Re-Evaluation):  Psychosocial Re-Evaluation - 02/27/24 1118       Psychosocial Re-Evaluation   Current issues with None Identified    Comments Monthly psychosocial re-evaluation is as follows: Hadlee continues to deny any psychosocial barriers or concerns. We will continue to monitor and assess her needs.    Expected Outcomes For Shany to particpate in PR without any psychosocial barriers    Interventions Encouraged to attend Pulmonary Rehabilitation for the exercise    Continue Psychosocial Services  No Follow up required          Education: Education Goals: Education classes will be provided on a weekly basis, covering required topics. Participant will state understanding/return demonstration of topics presented.  Learning  Barriers/Preferences:  Learning Barriers/Preferences - 12/19/23 1411       Learning Barriers/Preferences   Learning Barriers Sight    Learning Preferences Pictoral;Written Material;Video;Skilled Demonstration          General Pulmonary Education Topics:  Infection Prevention: - Provides verbal and written material to individual with discussion of infection control including proper hand washing and proper equipment cleaning during exercise session.   Falls Prevention: - Provides verbal and written material to individual with discussion of falls prevention and safety.   Chronic Lung Disease Review: - Group verbal instruction with posters, models, PowerPoint presentations and videos,  to review new updates, new respiratory medications, new advancements in procedures and treatments. Providing information on websites and 800 numbers for continued self-education. Includes information about supplement oxygen , available portable oxygen  systems, continuous and intermittent flow rates, oxygen  safety, concentrators, and Medicare reimbursement for oxygen . Explanation of Pulmonary Drugs, including class, frequency, complications, importance of spacers, rinsing mouth after steroid MDI's, and proper cleaning methods for nebulizers. Review of basic lung anatomy and physiology related to function, structure, and complications of lung disease. Review of risk factors. Discussion about methods for diagnosing sleep apnea and types of masks and machines for OSA. Includes a review of the use of types of environmental controls: home humidity, furnaces,  filters, dust mite/pet prevention, HEPA vacuums. Discussion about weather changes, air quality and the benefits of nasal washing. Instruction on Warning signs, infection symptoms, calling MD promptly, preventive modes, and value of vaccinations. Review of effective airway clearance, coughing and/or vibration techniques. Emphasizing that all should Create an Action  Plan. Written material provided at class time.   AED/CPR: - Group verbal and written instruction with the use of models to demonstrate the basic use of the AED with the basic ABC's of resuscitation.    Tests and Procedures:  - Group verbal and visual presentation and models provide information about basic cardiac anatomy and function. Reviews the testing methods done to diagnose heart disease and the outcomes of the test results. Describes the treatment choices: Medical Management, Angioplasty, or Coronary Bypass Surgery for treating various heart conditions including Myocardial Infarction, Angina, Valve Disease, and Cardiac Arrhythmias.  Written material provided at class time.   Medication Safety: - Group verbal and visual instruction to review commonly prescribed medications for heart and lung disease. Reviews the medication, class of the drug, and side effects. Includes the steps to properly store meds and maintain the prescription regimen.  Written material given at graduation.   Other: -Provides group and verbal instruction on various topics (see comments)   Knowledge Questionnaire Score:  Knowledge Questionnaire Score - 12/19/23 1424       Knowledge Questionnaire Score   Pre Score 15/18           Core Components/Risk Factors/Patient Goals at Admission:  Personal Goals and Risk Factors at Admission - 12/19/23 1412       Core Components/Risk Factors/Patient Goals on Admission    Weight Management Yes;Weight Loss    Intervention Weight Management: Develop a combined nutrition and exercise program designed to reach desired caloric intake, while maintaining appropriate intake of nutrient and fiber, sodium and fats, and appropriate energy expenditure required for the weight goal.;Weight Management: Provide education and appropriate resources to help participant work on and attain dietary goals.;Weight Management/Obesity: Establish reasonable short term and long term weight  goals.;Obesity: Provide education and appropriate resources to help participant work on and attain dietary goals.    Expected Outcomes Understanding of distribution of calorie intake throughout the day with the consumption of 4-5 meals/snacks;Understanding recommendations for meals to include 15-35% energy as protein, 25-35% energy from fat, 35-60% energy from carbohydrates, less than 200mg  of dietary cholesterol, 20-35 gm of total fiber daily;Weight Loss: Understanding of general recommendations for a balanced deficit meal plan, which promotes 1-2 lb weight loss per week and includes a negative energy balance of [phone] kcal/d;Weight Maintenance: Understanding of the daily nutrition guidelines, which includes 25-35% calories from fat, 7% or less cal from saturated fats, less than 200mg  cholesterol, less than 1.5gm of sodium, & 5 or more servings of fruits and vegetables daily;Long Term: Adherence to nutrition and physical activity/exercise program aimed toward attainment of established weight goal;Short Term: Continue to assess and modify interventions until short term weight is achieved    Improve shortness of breath with ADL's Yes    Intervention Provide education, individualized exercise plan and daily activity instruction to help decrease symptoms of SOB with activities of daily living.    Expected Outcomes Short Term: Improve cardiorespiratory fitness to achieve a reduction of symptoms when performing ADLs;Long Term: Be able to perform more ADLs without symptoms or delay the onset of symptoms          Education:Diabetes - Individual verbal and written instruction to review signs/symptoms of  diabetes, desired ranges of glucose level fasting, after meals and with exercise. Acknowledge that pre and post exercise glucose checks will be done for 3 sessions at entry of program.   Know Your Numbers and Heart Failure: - Group verbal and visual instruction to discuss disease risk factors for cardiac and  pulmonary disease and treatment options.  Reviews associated critical values for Overweight/Obesity, Hypertension, Cholesterol, and Diabetes.  Discusses basics of heart failure: signs/symptoms and treatments.  Introduces Heart Failure Zone chart for action plan for heart failure. Written material provided at class time.   Core Components/Risk Factors/Patient Goals Review:   Goals and Risk Factor Review     Row Name 01/05/24 1058 01/30/24 0857 02/27/24 1118         Core Components/Risk Factors/Patient Goals Review   Personal Goals Review Improve shortness of breath with ADL's;Develop more efficient breathing techniques such as purse lipped breathing and diaphragmatic breathing and practicing self-pacing with activity.;Weight Management/Obesity Improve shortness of breath with ADL's;Develop more efficient breathing techniques such as purse lipped breathing and diaphragmatic breathing and practicing self-pacing with activity.;Weight Management/Obesity Improve shortness of breath with ADL's;Weight Management/Obesity     Review Monthly review of patient's Core Components/Risk Factors/Patient Goals are as follows: Goal progressing for improving shortness of breath with ADL's. Emori has attended 2 sessions so far. She started exercising on the NuStep and the seated elliptical. She was able to keep her oxygen  saturation WNLs with exertion. We started working with Freddy on developing more efficient breathing techniques such as purse lipped breathing and diaphragmatic breathing; and practicing self-pacing with activity. Our dietitian has met with Berwyn and assessed her caloric needs. Goal progressing on weight loss while in the program. Unable to assess any weight loss yet as she has only attended 2 sessions. We will continue to monitor Elena's progress throughout the program. Monthly review of patient's Core Components/Risk Factors/Patient Goals are as follows: Goal progressing for improving shortness of  breath with ADL's. Lashondra is exercising on the NuStep and the seated elliptical. She is able to keep her oxygen  saturation WNLs with exertion. Goal met on developing more efficient breathing techniques such as purse lipped breathing and diaphragmatic breathing; and practicing self-pacing with activity. Connie is able to initiate pursed lip breathing independently. She can slow her pace or stop if needed. Goal progressing on weight loss. Feiga recently increased her dose of Zepbound  but has been maintaining her weight. She is working with our dietitian to reduce calories. We will continue to monitor Shonda's progress throughout the program. Monthly review of patient's Core Components/Risk Factors/Patient Goals are as follows: Goal progressing for improving shortness of breath with ADL's. Janayah is exercising on the NuStep and the seated elliptical. She is able to keep her oxygen  saturation WNLs with exertion. Goal progressing on weight loss. Arhianna recently increased her dose of Zepbound  but has been maintaining her weight. She is working with our dietitian to reduce calories. We will continue to monitor Chrishonda's progress throughout the program.     Expected Outcomes Pt will show progress toward meeting expected goals and outcomes. Pt will show progress toward meeting expected goals and outcomes. Pt will show progress toward meeting expected goals and outcomes.        Core Components/Risk Factors/Patient Goals at Discharge (Final Review):   Goals and Risk Factor Review - 02/27/24 1118       Core Components/Risk Factors/Patient Goals Review   Personal Goals Review Improve shortness of breath with ADL's;Weight Management/Obesity    Review  Monthly review of patient's Core Components/Risk Factors/Patient Goals are as follows: Goal progressing for improving shortness of breath with ADL's. Amaiah is exercising on the NuStep and the seated elliptical. She is able to keep her oxygen  saturation WNLs with  exertion. Goal progressing on weight loss. Alleya recently increased her dose of Zepbound  but has been maintaining her weight. She is working with our dietitian to reduce calories. We will continue to monitor Khaliah's progress throughout the program.    Expected Outcomes Pt will show progress toward meeting expected goals and outcomes.          ITP Comments: Pt is making expected progress toward Pulmonary Rehab goals after completing 15 session(s). Recommend continued exercise, life style modification, education, and utilization of breathing techniques to increase stamina and strength, while also decreasing shortness of breath with exertion.    Comments: Dr. Slater Staff is Medical Director for Pulmonary Rehab at [facility].

## 2024-03-04 ENCOUNTER — Other Ambulatory Visit: Payer: Self-pay | Admitting: Internal Medicine

## 2024-03-04 ENCOUNTER — Encounter (HOSPITAL_COMMUNITY)
Admission: RE | Admit: 2024-03-04 | Discharge: 2024-03-04 | Disposition: A | Discharge status: Home / Self Care | Source: Ambulatory Visit | Attending: Pulmonary Disease | Admitting: Pulmonary Disease

## 2024-03-04 ENCOUNTER — Ambulatory Visit
Admission: RE | Admit: 2024-03-04 | Discharge: 2024-03-04 | Disposition: A | Discharge status: Home / Self Care | Source: Ambulatory Visit | Attending: Internal Medicine | Admitting: Internal Medicine

## 2024-03-04 ENCOUNTER — Telehealth (HOSPITAL_COMMUNITY): Payer: Self-pay | Admitting: *Deleted

## 2024-03-04 DIAGNOSIS — S22079A Unspecified fracture of T9-T10 vertebra, initial encounter for closed fracture: Secondary | ICD-10-CM

## 2024-03-04 DIAGNOSIS — M47816 Spondylosis without myelopathy or radiculopathy, lumbar region: Secondary | ICD-10-CM | POA: Diagnosis not present

## 2024-03-04 DIAGNOSIS — S22070K Wedge compression fracture of T9-T10 vertebra, subsequent encounter for fracture with nonunion: Secondary | ICD-10-CM | POA: Diagnosis not present

## 2024-03-04 DIAGNOSIS — M51369 Other intervertebral disc degeneration, lumbar region without mention of lumbar back pain or lower extremity pain: Secondary | ICD-10-CM | POA: Diagnosis not present

## 2024-03-04 DIAGNOSIS — Z6833 Body mass index (BMI) 33.0-33.9, adult: Secondary | ICD-10-CM | POA: Diagnosis not present

## 2024-03-04 NOTE — Telephone Encounter (Signed)
 Pt called to report that she has fractured her vertebrae. She will not be able to exercise for 6-8 weeks. She will be discharged from the PR program. We discussed she can get another referral for PR when she is ready. She had great success in the program.  Aliene Aris BS, ACSM-CEP 03/04/2024 4:10 PM

## 2024-03-05 NOTE — Progress Notes (Signed)
 Discharge Progress Report  Patient Details  Name: Amber Bennett MRN: [id number] Date of Birth: [birth year] Referring Provider:   Conrad Ports Pulmonary Rehab Walk Test from 12/19/2023 in [facility]  Referring Provider Dr. Lonni Nanas     Number of Visits: 15  Reason for Discharge:  Early Exit:  fall with injury  Smoking History:  Social History   Tobacco Use  Smoking Status Former   Current packs/day: 1.50   Average packs/day: 1.5 packs/day for 45.7 years (68.5 ttl pk-yrs)   Types: Cigarettes   Start date: 07/01/1978  Smokeless Tobacco Never  Tobacco Comments   Former smoker 04/10/23    Diagnosis:  Restrictive lung disease  Pulmonary HTN (HCC)  ADL UCSD:  Pulmonary Assessment Scores     Row Name 12/19/23 1416         ADL UCSD   ADL Phase Entry     SOB Score total 28       CAT Score   CAT Score 18       mMRC Score   mMRC Score 0        Initial Exercise Prescription:  Initial Exercise Prescription - 12/19/23 1500       Date of Initial Exercise RX and Referring Provider   Date 12/19/23    Referring Provider Dr. Lonni Nanas    Expected Discharge Date 03/18/24      NuStep   Level 2    SPM 60    Minutes 15    METs 1.1      Recumbant Elliptical   Level 1    RPM 26    Watts 12    Minutes 15    METs 1.2      Prescription Details   Frequency (times per week) 2    Duration Progress to 30 minutes of continuous aerobic without signs/symptoms of physical distress      Intensity   THRR 40-80% of Max Heartrate 58-116    Ratings of Perceived Exertion 11-13    Perceived Dyspnea 0-4      Progression   Progression Continue to progress workloads to maintain intensity without signs/symptoms of physical distress.      Resistance Training   Training Prescription Yes    Weight red bands    Reps 10-15          Discharge Exercise Prescription (Final Exercise Prescription  Changes):  Exercise Prescription Changes - 02/26/24 1430       Response to Exercise   Blood Pressure (Admit) 122/66    Blood Pressure (Exit) 104/62    Heart Rate (Admit) 75 bpm    Heart Rate (Exercise) 93 bpm    Heart Rate (Exit) 78 bpm    Oxygen  Saturation (Admit) 97 %    Oxygen  Saturation (Exercise) 94 %    Oxygen  Saturation (Exit) 97 %    Rating of Perceived Exertion (Exercise) 11    Perceived Dyspnea (Exercise) 1    Duration Continue with 30 min of aerobic exercise without signs/symptoms of physical distress.    Intensity THRR unchanged      Progression   Progression Continue to progress workloads to maintain intensity without signs/symptoms of physical distress.      Resistance Training   Training Prescription Yes    Weight blue bands    Reps 10-15    Time 10 Minutes      NuStep   Level 3    SPM 102  Minutes 15    METs 2.7      Recumbant Elliptical   Level 3    Minutes 15    METs 2.8          Functional Capacity:  6 Minute Walk     Row Name 12/19/23 1522         6 Minute Walk   Phase Initial     Distance 872 feet     Walk Time 6 minutes     # of Rest Breaks 0     MPH 1.65     METS 1.59     RPE 11     Perceived Dyspnea  1     VO2 Peak 5.55     Symptoms No     Resting HR 83 bpm     Resting BP 126/62     Resting Oxygen  Saturation  98 %     Exercise Oxygen  Saturation  during 6 min walk 96 %     Max Ex. HR 100 bpm     Max Ex. BP 146/62     2 Minute Post BP 130/60       Interval HR   1 Minute HR 90     2 Minute HR 94     3 Minute HR 98     4 Minute HR 100     5 Minute HR 98     6 Minute HR 98     2 Minute Post HR 84     Interval Heart Rate? Yes       Interval Oxygen    Interval Oxygen ? Yes     Baseline Oxygen  Saturation % 98 %     1 Minute Oxygen  Saturation % 98 %     1 Minute Liters of Oxygen  0 L     2 Minute Oxygen  Saturation % 96 %     2 Minute Liters of Oxygen  0 L     3 Minute Oxygen  Saturation % 97 %     3 Minute Liters of  Oxygen  0 L     4 Minute Oxygen  Saturation % 98 %     4 Minute Liters of Oxygen  0 L     5 Minute Oxygen  Saturation % 98 %     5 Minute Liters of Oxygen  0 L     6 Minute Oxygen  Saturation % 97 %     6 Minute Liters of Oxygen  0 L     2 Minute Post Oxygen  Saturation % 99 %     2 Minute Post Liters of Oxygen  0 L        Psychological, QOL, Others - Outcomes: PHQ 2/9:    12/19/2023    2:08 PM 06/18/2016    3:57 PM  Depression screen PHQ 2/9  Decreased Interest 0 0  Down, Depressed, Hopeless 0 0  PHQ - 2 Score 0 0  Altered sleeping 0 0  Tired, decreased energy 1 0  Change in appetite 0 0  Feeling bad or failure about yourself  0 0  Trouble concentrating 0 0  Moving slowly or fidgety/restless 0 0  Suicidal thoughts 0 0   PHQ-9 Score 1 0  Difficult doing work/chores Somewhat difficult      Data saved with a previous flowsheet row definition    Quality of Life:   Personal Goals: Goals established at orientation with interventions provided to work toward goal.  Personal Goals and Risk Factors at Admission - 12/19/23 1412  Core Components/Risk Factors/Patient Goals on Admission    Weight Management Yes;Weight Loss    Intervention Weight Management: Develop a combined nutrition and exercise program designed to reach desired caloric intake, while maintaining appropriate intake of nutrient and fiber, sodium and fats, and appropriate energy expenditure required for the weight goal.;Weight Management: Provide education and appropriate resources to help participant work on and attain dietary goals.;Weight Management/Obesity: Establish reasonable short term and long term weight goals.;Obesity: Provide education and appropriate resources to help participant work on and attain dietary goals.    Expected Outcomes Understanding of distribution of calorie intake throughout the day with the consumption of 4-5 meals/snacks;Understanding recommendations for meals to include 15-35% energy as  protein, 25-35% energy from fat, 35-60% energy from carbohydrates, less than 200mg  of dietary cholesterol, 20-35 gm of total fiber daily;Weight Loss: Understanding of general recommendations for a balanced deficit meal plan, which promotes 1-2 lb weight loss per week and includes a negative energy balance of [phone] kcal/d;Weight Maintenance: Understanding of the daily nutrition guidelines, which includes 25-35% calories from fat, 7% or less cal from saturated fats, less than 200mg  cholesterol, less than 1.5gm of sodium, & 5 or more servings of fruits and vegetables daily;Long Term: Adherence to nutrition and physical activity/exercise program aimed toward attainment of established weight goal;Short Term: Continue to assess and modify interventions until short term weight is achieved    Improve shortness of breath with ADL's Yes    Intervention Provide education, individualized exercise plan and daily activity instruction to help decrease symptoms of SOB with activities of daily living.    Expected Outcomes Short Term: Improve cardiorespiratory fitness to achieve a reduction of symptoms when performing ADLs;Long Term: Be able to perform more ADLs without symptoms or delay the onset of symptoms           Personal Goals Discharge:  Goals and Risk Factor Review     Row Name 01/05/24 1058 01/30/24 0857 02/27/24 1118 03/05/24 1102       Core Components/Risk Factors/Patient Goals Review   Personal Goals Review Improve shortness of breath with ADL's;Develop more efficient breathing techniques such as purse lipped breathing and diaphragmatic breathing and practicing self-pacing with activity.;Weight Management/Obesity Improve shortness of breath with ADL's;Develop more efficient breathing techniques such as purse lipped breathing and diaphragmatic breathing and practicing self-pacing with activity.;Weight Management/Obesity Improve shortness of breath with ADL's;Weight Management/Obesity Improve shortness  of breath with ADL's;Weight Management/Obesity    Review Monthly review of patient's Core Components/Risk Factors/Patient Goals are as follows: Goal progressing for improving shortness of breath with ADL's. Amber Bennett has attended 2 sessions so far. She started exercising on the NuStep and the seated elliptical. She was able to keep her oxygen  saturation WNLs with exertion. We started working with Freddy on developing more efficient breathing techniques such as purse lipped breathing and diaphragmatic breathing; and practicing self-pacing with activity. Our dietitian has met with Amber Bennett and assessed her caloric needs. Goal progressing on weight loss while in the program. Unable to assess any weight loss yet as she has only attended 2 sessions. We will continue to monitor Amber Bennett's progress throughout the program. Monthly review of patient's Core Components/Risk Factors/Patient Goals are as follows: Goal progressing for improving shortness of breath with ADL's. Amber Bennett is exercising on the NuStep and the seated elliptical. She is able to keep her oxygen  saturation WNLs with exertion. Goal met on developing more efficient breathing techniques such as purse lipped breathing and diaphragmatic breathing; and practicing self-pacing with activity. Amber Bennett is  able to initiate pursed lip breathing independently. She can slow her pace or stop if needed. Goal progressing on weight loss. Amber Bennett recently increased her dose of Zepbound  but has been maintaining her weight. She is working with our dietitian to reduce calories. We will continue to monitor Ryver's progress throughout the program. Monthly review of patient's Core Components/Risk Factors/Patient Goals are as follows: Goal progressing for improving shortness of breath with ADL's. Amber Bennett is exercising on the NuStep and the seated elliptical. She is able to keep her oxygen  saturation WNLs with exertion. Goal progressing on weight loss. Amber Bennett recently increased her  dose of Zepbound  but has been maintaining her weight. She is working with our dietitian to reduce calories. We will continue to monitor Amber Bennett's progress throughout the program. Amber Bennett discharged from the [facility] on 03/04/24 completing 15 sessions due to a fall with injury. Discharge review of patient's Core Components/Risk Factors/Patient Goals are as follows: Goal met for improving her shortness of breath with ADL's. Amber Bennett exercised on the NuStep and the seated elliptical. She was able to keep her oxygen  saturation WNLs with exertion. Goal not met for weight loss. Amber Bennett recently increased her dose of Zepbound  but has been maintaining her weight since starting the program. She worked with our dietitian to reduce calories and improve her diet.    Expected Outcomes Pt will show progress toward meeting expected goals and outcomes. Pt will show progress toward meeting expected goals and outcomes. Pt will show progress toward meeting expected goals and outcomes. Pt will show progress toward meeting expected goals and outcomes.       Exercise Goals and Review:  Exercise Goals     Row Name 12/19/23 1417             Exercise Goals   Increase Physical Activity Yes       Intervention Provide advice, education, support and counseling about physical activity/exercise needs.;Develop an individualized exercise prescription for aerobic and resistive training based on initial evaluation findings, risk stratification, comorbidities and participant's personal goals.       Expected Outcomes Short Term: Attend rehab on a regular basis to increase amount of physical activity.;Long Term: Add in home exercise to make exercise part of routine and to increase amount of physical activity.;Long Term: Exercising regularly at least 3-5 days a week.       Increase Strength and Stamina Yes       Intervention Provide advice, education, support and counseling about physical activity/exercise needs.;Develop an  individualized exercise prescription for aerobic and resistive training based on initial evaluation findings, risk stratification, comorbidities and participant's personal goals.       Expected Outcomes Short Term: Increase workloads from initial exercise prescription for resistance, speed, and METs.;Short Term: Perform resistance training exercises routinely during rehab and add in resistance training at home;Long Term: Improve cardiorespiratory fitness, muscular endurance and strength as measured by increased METs and functional capacity ( )       Able to understand and use rate of perceived exertion (RPE) scale Yes       Intervention Provide education and explanation on how to use RPE scale       Expected Outcomes Short Term: Able to use RPE daily in rehab to express subjective intensity level;Long Term:  Able to use RPE to guide intensity level when exercising independently       Able to understand and use Dyspnea scale Yes       Intervention Provide education and explanation on how to  use Dyspnea scale       Expected Outcomes Short Term: Able to use Dyspnea scale daily in rehab to express subjective sense of shortness of breath during exertion;Long Term: Able to use Dyspnea scale to guide intensity level when exercising independently       Knowledge and understanding of Target Heart Rate Range (THRR) Yes       Intervention Provide education and explanation of THRR including how the numbers were predicted and where they are located for reference       Expected Outcomes Short Term: Able to state/look up THRR;Long Term: Able to use THRR to govern intensity when exercising independently;Short Term: Able to use daily as guideline for intensity in rehab       Understanding of Exercise Prescription Yes       Intervention Provide education, explanation, and written materials on patient's individual exercise prescription       Expected Outcomes Short Term: Able to explain program exercise  prescription;Long Term: Able to explain home exercise prescription to exercise independently          Exercise Goals Re-Evaluation:  Exercise Goals Re-Evaluation     Row Name 01/01/24 0849 02/03/24 0848 02/26/24 0908 03/05/24 1057       Exercise Goal Re-Evaluation   Exercise Goals Review Increase Physical Activity;Able to understand and use Dyspnea scale;Understanding of Exercise Prescription;Increase Strength and Stamina;Knowledge and understanding of Target Heart Rate Range (THRR);Able to understand and use rate of perceived exertion (RPE) scale Increase Physical Activity;Able to understand and use Dyspnea scale;Understanding of Exercise Prescription;Increase Strength and Stamina;Knowledge and understanding of Target Heart Rate Range (THRR);Able to understand and use rate of perceived exertion (RPE) scale Increase Physical Activity;Able to understand and use Dyspnea scale;Understanding of Exercise Prescription;Increase Strength and Stamina;Knowledge and understanding of Target Heart Rate Range (THRR);Able to understand and use rate of perceived exertion (RPE) scale Increase Physical Activity;Able to understand and use Dyspnea scale;Understanding of Exercise Prescription;Increase Strength and Stamina;Knowledge and understanding of Target Heart Rate Range (THRR);Able to understand and use rate of perceived exertion (RPE) scale    Comments Pt completed one exercise session. She exercised on the recumbent stepper for 15 min, level 2, METs 2.7. She then exercised on the recumbent elliptical for 15 min, level 1, METs approximately 3. She pushed herself but also has to take breaks due to hip/groin cramps. She is motivated. Performed warm up and cool down without significant limitations. Using blue bands, 5.8 lbs. Will progress as tolerated. Pt has completed 9 exercise session. She is exercising on the recumbent stepper for 15 min, level 2, METs 2.9. She then is exercising on the recumbent elliptical for 15  min, level 2, METs approximately 3.5. She has mostly overcome her back/hip pain with time. Will now increase level. Performs warm up and cool down without significant limitations. Using blue bands, 5.8 lbs. Will progress as tolerated. Pt has completed 14 exercise session, missing a few for vacation. She is exercising on the recumbent stepper for 15 min, level 3, METs 3. She then is exercising on the recumbent elliptical for 15 min, level 3, METs approximately 3.6. She has been able to increase her functional ability and was happy to walk on vacation up hills without rest stops.   Using blue bands, 5.8 lbs. Will progress as tolerated. Amber Bennett has completed 15 exercise sessions. Her peak METs were 3.7 on the recumbent elliptical and 3 on the Nustep. She is being discharged due to injury.    Expected Outcomes Through  exercise at rehab and home, the patient will decrease shortness of breath and feel confident in carrying out an exercise regimen at home. Through exercise at rehab and home, the patient will decrease shortness of breath and feel confident in carrying out an exercise regimen at home. Through exercise at rehab and home, the patient will decrease shortness of breath and feel confident in carrying out an exercise regimen at home. Through exercise at rehab and home, the patient will decrease shortness of breath with daily activities and feel confident in carrying out an exercise regimen at home.       Nutrition & Weight - Outcomes:    Nutrition:  Nutrition Therapy & Goals - 01/29/24 1354       Nutrition Therapy   Diet General healthy diet      Personal Nutrition Goals   Nutrition Goal Patient to improve diet quality by using the plate method as a guide for meal planning to include lean protein/plant protein, fruits, vegetables, whole grains, nonfat dairy as part of a well-balanced diet.   goal in action.   Personal Goal #2 Patient to identify strategies for weight loss of 0.5-2.0# per week.    goal not met.   Comments Goals in progress. Amber Bennett has medical history restrictive lung disease, pulmonary HTN, heart failure, NASH, OSA. She is taking zepbound  for weight loss and has started following the Mediterranean diet. Zepbound  was recently increased to 10mg  and she has maintained her weight since starting the medication. Have discussed multiple strategies for weight loss including calorie density, the plate method as a guide for meal planning, protein supplements, etc.  Patient will benefit from participation in pulmonary  rehab for nutrition education, exercise, and lifestyle modification.      Intervention Plan   Intervention Prescribe, educate and counsel regarding individualized specific dietary modifications aiming towards targeted core components such as weight, hypertension, lipid management, diabetes, heart failure and other comorbidities.;Nutrition handout(s) given to patient.    Expected Outcomes Short Term Goal: Understand basic principles of dietary content, such as calories, fat, sodium, cholesterol and nutrients.;Long Term Goal: Adherence to prescribed nutrition plan.          Nutrition Discharge:  Nutrition Assessments - 12/30/23 1521       Rate Your Plate Scores   Pre Score 74          Education Questionnaire Score:  Knowledge Questionnaire Score - 12/19/23 1424       Knowledge Questionnaire Score   Pre Score 15/18         Amber Bennett discharged from the [facility] due to a fall with injury. Psychosocial re-evaluation at discharge is as follows: Amber Bennett is upset with herself that she fell and fractured her back. She is disappointed to not continue the program. She denied any immediate psychosocial barriers or concerns. She states she has great support from her husband and son who is an MD.   Discharge review of patient's Core Components/Risk Factors/Patient Goals are as follows: Goal met for improving her shortness of breath with ADL's. Amber Bennett  exercised on the NuStep and the seated elliptical. She was able to keep her oxygen  saturation WNLs with exertion. Goal not met for weight loss. Amber Bennett recently increased her dose of Zepbound  but has been maintaining her weight since starting the program. She worked with our dietitian to reduce calories and improve her diet.

## 2024-03-09 ENCOUNTER — Encounter (HOSPITAL_COMMUNITY)

## 2024-03-11 ENCOUNTER — Encounter (HOSPITAL_COMMUNITY)

## 2024-03-12 ENCOUNTER — Telehealth: Payer: Self-pay | Admitting: Internal Medicine

## 2024-03-12 ENCOUNTER — Encounter (HOSPITAL_BASED_OUTPATIENT_CLINIC_OR_DEPARTMENT_OTHER): Payer: Self-pay | Admitting: Nurse Practitioner

## 2024-03-12 ENCOUNTER — Other Ambulatory Visit: Payer: Self-pay | Admitting: Nurse Practitioner

## 2024-03-12 DIAGNOSIS — R1013 Epigastric pain: Secondary | ICD-10-CM | POA: Diagnosis not present

## 2024-03-12 DIAGNOSIS — M810 Age-related osteoporosis without current pathological fracture: Secondary | ICD-10-CM | POA: Diagnosis not present

## 2024-03-12 DIAGNOSIS — Z23 Encounter for immunization: Secondary | ICD-10-CM | POA: Diagnosis not present

## 2024-03-12 DIAGNOSIS — R110 Nausea: Secondary | ICD-10-CM | POA: Diagnosis not present

## 2024-03-12 DIAGNOSIS — M549 Dorsalgia, unspecified: Secondary | ICD-10-CM | POA: Diagnosis not present

## 2024-03-12 DIAGNOSIS — I119 Hypertensive heart disease without heart failure: Secondary | ICD-10-CM | POA: Diagnosis not present

## 2024-03-12 DIAGNOSIS — S22079D Unspecified fracture of T9-T10 vertebra, subsequent encounter for fracture with routine healing: Secondary | ICD-10-CM | POA: Diagnosis not present

## 2024-03-12 DIAGNOSIS — K625 Hemorrhage of anus and rectum: Secondary | ICD-10-CM | POA: Diagnosis not present

## 2024-03-12 DIAGNOSIS — K649 Unspecified hemorrhoids: Secondary | ICD-10-CM | POA: Diagnosis not present

## 2024-03-12 DIAGNOSIS — S32050D Wedge compression fracture of fifth lumbar vertebra, subsequent encounter for fracture with routine healing: Secondary | ICD-10-CM | POA: Diagnosis not present

## 2024-03-12 DIAGNOSIS — R0781 Pleurodynia: Secondary | ICD-10-CM | POA: Diagnosis not present

## 2024-03-12 DIAGNOSIS — K130 Diseases of lips: Secondary | ICD-10-CM | POA: Diagnosis not present

## 2024-03-12 DIAGNOSIS — Z1389 Encounter for screening for other disorder: Secondary | ICD-10-CM | POA: Diagnosis not present

## 2024-03-12 LAB — LAB REPORT - SCANNED: EGFR (Non-African Amer.): 120.3

## 2024-03-12 NOTE — Telephone Encounter (Signed)
 Received a call from patient stating she fell on her stomach during labor day and ended up fractured some of her back. Bloody stool, stomach pains, tenderness and nausea have overcome her and is requesting a nurse fu call to discuss solutions or sooner schedule (tried scheduling for 11/4). Please review and advise  Thank you

## 2024-03-13 ENCOUNTER — Other Ambulatory Visit: Payer: Self-pay | Admitting: Cardiovascular Disease

## 2024-03-13 ENCOUNTER — Ambulatory Visit (HOSPITAL_BASED_OUTPATIENT_CLINIC_OR_DEPARTMENT_OTHER)
Admission: RE | Admit: 2024-03-13 | Discharge: 2024-03-13 | Disposition: A | Discharge status: Home / Self Care | Source: Ambulatory Visit | Attending: Nurse Practitioner | Admitting: Nurse Practitioner

## 2024-03-13 DIAGNOSIS — R1013 Epigastric pain: Secondary | ICD-10-CM | POA: Insufficient documentation

## 2024-03-13 DIAGNOSIS — R110 Nausea: Secondary | ICD-10-CM | POA: Insufficient documentation

## 2024-03-13 DIAGNOSIS — K625 Hemorrhage of anus and rectum: Secondary | ICD-10-CM | POA: Insufficient documentation

## 2024-03-13 DIAGNOSIS — I480 Paroxysmal atrial fibrillation: Secondary | ICD-10-CM

## 2024-03-13 DIAGNOSIS — R109 Unspecified abdominal pain: Secondary | ICD-10-CM | POA: Diagnosis not present

## 2024-03-13 MED ORDER — IOHEXOL 300 MG/ML  SOLN
100.0000 mL | Freq: Once | INTRAMUSCULAR | Status: AC | PRN
Start: 2024-03-13 — End: 2024-03-13
  Administered 2024-03-13: 80 mL via INTRAVENOUS

## 2024-03-15 DIAGNOSIS — R1030 Lower abdominal pain, unspecified: Secondary | ICD-10-CM | POA: Diagnosis not present

## 2024-03-15 DIAGNOSIS — N390 Urinary tract infection, site not specified: Secondary | ICD-10-CM | POA: Diagnosis not present

## 2024-03-15 NOTE — Progress Notes (Unsigned)
 Amber Bennett [id number] 05/24/1951   Chief Complaint:  Referring Provider: Shayne Anes, MD Primary GI MD:   HPI: Amber Bennett is a 73 y.o. female with past medical history of *** who presents today for a complaint of *** .     (Dr. Orlando mom)  Previous GI Procedures/Imaging      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   Carpal tunnel syndrome    Cervical radiculitis    Chronic diarrhea 10/24/2023   Collar bone fracture    Depression    Dermatomyositis (HCC)    Diabetes mellitus without complication (HCC)    Dysrhythmia    Elevated liver enzymes    External hemorrhoids    Fatty liver    Fibromyalgia    Fibromyalgia affecting hand 08/2022   bilateral wrist and hands   Hematuria    negative evaluation   High cholesterol    HTN (hypertension)    Internal hemorrhoids    Laryngitis    Lower back pain    Macular degeneration    Nonalcoholic steatohepatitis (NASH)    Nonalcoholic steatohepatitis (NASH)    Osteopenia    Left side hip   Seizures (HCC)    09/07/2019 pt reports last seizure in 2014   Tubular adenoma of colon     Past Surgical History:  Procedure Laterality Date   ABDOMINAL HYSTERECTOMY  1982   ADENOIDECTOMY     APPENDECTOMY     ATRIAL FIBRILLATION ABLATION N/A 03/13/2023   Procedure: ATRIAL FIBRILLATION ABLATION;  Surgeon: Inocencio Soyla Lunger, MD;  Location: [facility];  Service: Cardiovascular;  Laterality: N/A;   BACK SURGERY     BACK SURGERY  07/2015   BRONCHIAL WASHINGS  09/19/2023   Procedure: IRRIGATION, BRONCHUS;  Surgeon: Kara Dorn NOVAK, MD;  Location: [facility];  Service: Pulmonary;;   CARPAL TUNNEL RELEASE     CATARACT EXTRACTION, BILATERAL     CHOLECYSTECTOMY  2003   COLONOSCOPY     EXPLORATORY LAPAROTOMY     FLEXIBLE BRONCHOSCOPY Bilateral 09/19/2023   Procedure:  BRONCHOSCOPY, FLEXIBLE;  Surgeon: Kara Dorn NOVAK, MD;  Location: [facility] ENDOSCOPY;  Service: Pulmonary;  Laterality: Bilateral;  bronchoscopy for BAL   LAMINECTOMY     RIGHT HEART CATH N/A 11/27/2023   Procedure: RIGHT HEART CATH;  Surgeon: Cherrie Toribio JONELLE, MD;  Location: [facility];  Service: Cardiovascular;  Laterality: N/A;   SHOULDER SURGERY Right 2000   SINOSCOPY     TONSILLECTOMY     TOTAL HIP ARTHROPLASTY Right 02/12/2022   Procedure: RIGHT TOTAL HIP ARTHROPLASTY ANTERIOR APPROACH;  Surgeon: Vernetta Lonni GRADE, MD;  Location: [facility] OR;  Service: Orthopedics;  Laterality: Right;    Current Outpatient Medications  Medication Sig Dispense Refill   acetaminophen  (TYLENOL ) 500 MG tablet Take 500 mg by mouth every 6 (six) hours as needed for moderate pain (pain score 4-6) or mild pain (pain score 1-3).     Alirocumab  (PRALUENT ) 150 MG/ML SOAJ Inject 1 mL (150 mg total) into the skin every 14 (fourteen) days. 6 mL 3   amLODipine  (NORVASC ) 2.5 MG tablet Take 1 tablet (2.5 mg total) by mouth at bedtime. 90 tablet 3   apixaban  (ELIQUIS ) 5 MG TABS tablet TAKE 1 TABLET(5 MG) BY MOUTH TWICE DAILY 180 tablet 2  Ascorbic Acid (VITAMIN C) 1000 MG tablet Take 1,000 mg by mouth daily. Airborne emergen c     beclomethasone (QVAR  REDIHALER) 80 MCG/ACT inhaler Inhale 1 puff into the lungs 2 (two) times daily. 31.8 g 1   Calcium Carb-Cholecalciferol  (CALTRATE 600+D3 PO) Take 1 tablet by mouth daily.     Cholecalciferol  (VITAMIN D ) 50 MCG (2000 UT) tablet Take 2,000 Units by mouth daily.     Coenzyme Q10 (CO Q 10 PO) Take 200 mg by mouth daily.     colestipol  (COLESTID ) 1 g tablet TAKE 2 TABLETS(2 GRAMS) BY MOUTH AT BEDTIME 180 tablet 0   cyclobenzaprine  (FLEXERIL ) 5 MG tablet Take 1 tablet (5 mg total) by mouth every 8 (eight) hours as needed. 30 tablet 1   diphenoxylate -atropine  (LOMOTIL ) 2.5-0.025 MG tablet Take 1-2 tablets by mouth every 4 hours as needed for diarrhea/loose stools 90 tablet  1   DULoxetine  (CYMBALTA ) 30 MG capsule Take 1 capsule (30 mg total) by mouth every evening. 90 capsule 4   ezetimibe  (ZETIA ) 10 MG tablet Take 10 mg by mouth every evening.     furosemide  (LASIX ) 40 MG tablet Take 2 tablets (80 mg total) by mouth daily. 180 tablet 3   gabapentin  (NEURONTIN ) 800 MG tablet Take 1 tablet (800 mg total) by mouth 2 (two) times daily. 180 tablet 3   ibandronate (BONIVA) 150 MG tablet Take 150 mg by mouth every 30 (thirty) days.     ipratropium (ATROVENT ) 0.03 % nasal spray Place 2 sprays into both nostrils every 12 (twelve) hours. 30 mL 4   ipratropium-albuterol  (DUONEB) 0.5-2.5 (3) MG/3ML SOLN Take 3 mLs by nebulization 3 (three) times daily as needed. 360 mL 5   loratadine  (CLARITIN ) 10 MG tablet Take 1 tablet (10 mg total) by mouth daily as needed for allergies (Can take an extra dose during flare ups.). 90 tablet 1   metoprolol  tartrate (LOPRESSOR ) 50 MG tablet Take 1 tablet (50 mg total) by mouth 2 (two) times daily.     mometasone  (ASMANEX ) 220 MCG/ACT inhaler Inhale 1 puff into the lungs 2 (two) times daily. 3 each 1   Multiple Vitamins-Minerals (MULTIVITAMIN PO) Take 1 tablet by mouth daily.     Multiple Vitamins-Minerals (PRESERVISION AREDS PO) Take 1 capsule by mouth daily.     Olopatadine-Mometasone  (RYALTRIS ) 665-25 MCG/ACT SUSP Place 2 sprays into the nose 2 (two) times daily. 87 g 1   omeprazole (PRILOSEC) 20 MG capsule Take 20 mg by mouth 2 (two) times daily before a meal.  3   PHENobarbital  (LUMINAL) 97.2 MG tablet Take 1.5 tablets (145.8 mg total) by mouth daily. 135 tablet 2   potassium chloride  SA (KLOR-CON  M) 20 MEQ tablet While taking 40 mg of Furosemide , take Potassium Chloride  20 meq twice a day. When you decrease Furosemide  to 20 mg, decrease Potassium Chloride  to 20 meq once a day. 180 tablet 3   PRALUENT  150 MG/ML SOAJ Inject 150 mg into the skin every 14 (fourteen) days.     pyridoxine  (B-6) 100 MG tablet Take 100 mg by mouth daily.      sacubitril-valsartan (ENTRESTO ) 49-51 MG TAKE 1 TABLET BY MOUTH TWICE DAILY 180 tablet 2   topiramate  (TOPAMAX ) 50 MG tablet TAKE 1 TABLET BY MOUTH IN THE MORNIGN& 2 TABS EVERY EVENING 270 tablet 1   vitamin E 400 UNIT capsule Take 400 Units by mouth daily.     ZEPBOUND  2.5 MG/0.5ML Pen Inject 2.5 mg into the skin once a week. 7.5  mg she is taking     No current facility-administered medications for this visit.    Allergies as of 03/16/2024 - Review Complete 12/19/2023  Allergen Reaction Noted   Aspirin  Other (See Comments) 11/06/2009   Cefdinir Other (See Comments) 08/02/2022   Colesevelam Other (See Comments) 10/25/2011   Ezetimibe -simvastatin Other (See Comments) 07/26/2009   Penicillin g benzathine Itching 08/19/2022   Rosuvastatin  07/26/2009   Wound dressing adhesive Other (See Comments) 01/23/2021   Latex Itching 04/12/2013    Family History  Problem Relation Age of Onset   Cancer Mother        Cancer of the bone marrow   Heart attack Mother    Stroke Sister    Hypertension Sister    Diabetes Sister    Colon cancer Maternal Aunt    Seizures Neg Hx    Esophageal cancer Neg Hx    Rectal cancer Neg Hx    Stomach cancer Neg Hx    Breast cancer Neg Hx    BRCA 1/2 Neg Hx     Social History   Tobacco Use   Smoking status: Former    Current packs/day: 1.50    Average packs/day: 1.5 packs/day for 45.7 years (68.6 ttl pk-yrs)    Types: Cigarettes    Start date: 07/01/1978   Smokeless tobacco: Never   Tobacco comments:    Former smoker 04/10/23  Vaping Use   Vaping status: Never Used  Substance Use Topics   Alcohol  use: Yes    Alcohol /week: 7.0 standard drinks of alcohol     Types: 7 Glasses of wine per week    Comment: 1 glass of white wine nightly 04/10/23   Drug use: No     Review of Systems:    Constitutional: No weight loss, fever, chills, weakness or fatigue Eyes: No change in vision Ears, Nose, Throat:  No change in hearing or congestion Skin: No rash or  itching Cardiovascular: No chest pain, chest pressure or palpitations   Respiratory: No SOB or cough Gastrointestinal: See HPI and otherwise negative Genitourinary: No dysuria or change in urinary frequency Neurological: No headache, dizziness or syncope Musculoskeletal: No new muscle or joint pain Hematologic: No bleeding or bruising    Physical Exam:  Vital signs: LMP 07/01/1980   Constitutional: NAD, Well developed, Well nourished, alert and cooperative Head:  Normocephalic and atraumatic.  Eyes: No scleral icterus. Conjunctiva pink. Mouth: No oral lesions. Respiratory: Respirations even and unlabored. Lungs clear to auscultation bilaterally.  No wheezes, crackles, or rhonchi.  Cardiovascular:  Regular rate and rhythm. No murmurs. No peripheral edema. Gastrointestinal:  Soft, nondistended, nontender. No rebound or guarding. Normal bowel sounds. No appreciable masses or hepatomegaly. Rectal:  Not performed.  Neurologic:  Alert and oriented x4;  grossly normal neurologically.  Skin:   Dry and intact without significant lesions or rashes. Psychiatric: Oriented to person, place and time. Demonstrates good judgement and reason without abnormal affect or behaviors.   RELEVANT LABS AND IMAGING: CBC    Component Value Date/Time   WBC 6.1 11/19/2023 1225   WBC 6.0 10/24/2023 1108   RBC 4.03 11/19/2023 1225   RBC 4.28 10/24/2023 1108   HGB 11.2 (L) 11/27/2023 0844   HGB 13.0 11/19/2023 1225   HCT 33.0 (L) 11/27/2023 0844   HCT 38.3 11/19/2023 1225   PLT 164 11/19/2023 1225   MCV 95 11/19/2023 1225   MCH 32.3 11/19/2023 1225   MCH 32.5 10/24/2023 1108   MCHC 33.9 11/19/2023 1225  MCHC 34.1 10/24/2023 1108   RDW 11.6 (L) 11/19/2023 1225   LYMPHSABS 1.6 07/22/2023 1524   EOSABS 198 10/24/2023 1108   EOSABS 0.1 07/22/2023 1524   BASOSABS 48 10/24/2023 1108   BASOSABS 0.1 07/22/2023 1524    CMP     Component Value Date/Time   NA 132 (L) 12/09/2023 1343   NA 137  11/19/2023 1225   K 3.5 12/09/2023 1343   CL 97 (L) 12/09/2023 1343   CO2 26 12/09/2023 1343   GLUCOSE 82 12/09/2023 1343   BUN 22 12/09/2023 1343   BUN 13 11/19/2023 1225   CREATININE 0.67 12/09/2023 1343   CREATININE 0.49 (L) 10/24/2023 1108   CALCIUM 9.2 12/09/2023 1343   PROT 7.6 10/24/2023 1108   ALBUMIN  3.8 09/19/2023 0635   AST 35 10/24/2023 1108   ALT 34 (H) 10/24/2023 1108   ALKPHOS 62 09/19/2023 0635   BILITOT 0.6 10/24/2023 1108   GFRNONAA >60 12/09/2023 1343     Assessment/Plan:       Camie Furbish, PA-C [facility] 03/15/2024, 1:18 PM  Patient Care Team: Shayne Anes, MD as PCP - General (Internal Medicine) Francyne Headland, MD as PCP - Cardiology (Cardiology) Inocencio Soyla Lunger, MD as PCP - Electrophysiology (Cardiology)

## 2024-03-15 NOTE — Telephone Encounter (Signed)
 Eliquis  5mg  refill request received. Patient is 73 years old, weight-71.4kg, Crea-0.67 on 12/09/23, Diagnosis-Afib, and last seen by Dr. Francyne on 11/19/23. Dose is appropriate based on dosing criteria. Will send in refill to requested pharmacy.

## 2024-03-15 NOTE — Telephone Encounter (Signed)
 Scheduled pt to see Camie Furbish PA tomorrow at 2:30pm. Left detailed message on her cell phone.

## 2024-03-16 ENCOUNTER — Ambulatory Visit: Attending: Cardiovascular Disease | Admitting: Cardiovascular Disease

## 2024-03-16 ENCOUNTER — Encounter: Payer: Self-pay | Admitting: Cardiovascular Disease

## 2024-03-16 ENCOUNTER — Encounter: Payer: Self-pay | Admitting: Gastroenterology

## 2024-03-16 ENCOUNTER — Ambulatory Visit (INDEPENDENT_AMBULATORY_CARE_PROVIDER_SITE_OTHER): Admitting: Gastroenterology

## 2024-03-16 VITALS — BP 106/50 | HR 74 | Ht <= 58 in | Wt 158.2 lb

## 2024-03-16 VITALS — BP 118/70 | HR 74 | Ht <= 58 in | Wt 157.0 lb

## 2024-03-16 DIAGNOSIS — R110 Nausea: Secondary | ICD-10-CM | POA: Diagnosis not present

## 2024-03-16 DIAGNOSIS — D6869 Other thrombophilia: Secondary | ICD-10-CM | POA: Insufficient documentation

## 2024-03-16 DIAGNOSIS — K649 Unspecified hemorrhoids: Secondary | ICD-10-CM

## 2024-03-16 DIAGNOSIS — K625 Hemorrhage of anus and rectum: Secondary | ICD-10-CM

## 2024-03-16 DIAGNOSIS — K5900 Constipation, unspecified: Secondary | ICD-10-CM

## 2024-03-16 DIAGNOSIS — I480 Paroxysmal atrial fibrillation: Secondary | ICD-10-CM | POA: Diagnosis not present

## 2024-03-16 DIAGNOSIS — I700 Atherosclerosis of aorta: Secondary | ICD-10-CM | POA: Diagnosis not present

## 2024-03-16 DIAGNOSIS — Z7901 Long term (current) use of anticoagulants: Secondary | ICD-10-CM

## 2024-03-16 DIAGNOSIS — K602 Anal fissure, unspecified: Secondary | ICD-10-CM | POA: Diagnosis not present

## 2024-03-16 DIAGNOSIS — T466X5A Adverse effect of antihyperlipidemic and antiarteriosclerotic drugs, initial encounter: Secondary | ICD-10-CM | POA: Insufficient documentation

## 2024-03-16 DIAGNOSIS — G4733 Obstructive sleep apnea (adult) (pediatric): Secondary | ICD-10-CM | POA: Insufficient documentation

## 2024-03-16 DIAGNOSIS — I5032 Chronic diastolic (congestive) heart failure: Secondary | ICD-10-CM | POA: Insufficient documentation

## 2024-03-16 DIAGNOSIS — Z9181 History of falling: Secondary | ICD-10-CM | POA: Diagnosis not present

## 2024-03-16 DIAGNOSIS — E785 Hyperlipidemia, unspecified: Secondary | ICD-10-CM | POA: Diagnosis not present

## 2024-03-16 DIAGNOSIS — I10 Essential (primary) hypertension: Secondary | ICD-10-CM | POA: Diagnosis not present

## 2024-03-16 DIAGNOSIS — G720 Drug-induced myopathy: Secondary | ICD-10-CM | POA: Insufficient documentation

## 2024-03-16 DIAGNOSIS — R1013 Epigastric pain: Secondary | ICD-10-CM | POA: Diagnosis not present

## 2024-03-16 DIAGNOSIS — Z860101 Personal history of adenomatous and serrated colon polyps: Secondary | ICD-10-CM | POA: Diagnosis not present

## 2024-03-16 DIAGNOSIS — E669 Obesity, unspecified: Secondary | ICD-10-CM | POA: Insufficient documentation

## 2024-03-16 MED ORDER — DILTIAZEM GEL 2 %: CUTANEOUS | 1 refills | Status: DC

## 2024-03-16 MED ORDER — AMBULATORY NON FORMULARY MEDICATION: 1 refills | Status: DC

## 2024-03-16 MED ORDER — AMBULATORY NON FORMULARY MEDICATION: 1 refills | Status: AC

## 2024-03-16 MED ORDER — DILTIAZEM GEL 2 %
CUTANEOUS | 1 refills | Status: DC
Start: 2024-03-16 — End: 2024-03-16

## 2024-03-16 MED ORDER — HYDROCORTISONE ACETATE 25 MG RE SUPP: RECTAL | 0 refills | Status: DC

## 2024-03-16 NOTE — Patient Instructions (Addendum)
 We have sent the following medications to your pharmacy for you to pick up at your convenience: Anusol  suppositories-use one at night for 5-7 days.  We have sent a prescription for Diltiazem  2% gel to [facility] for you. Using your index finger, you should apply a small amount of medication inside the rectum up to your first knuckle/joint 2-3 times daily x 6-8 weeks.  [facility]'s information is below: Address: [address] [place], [place] [address]  [phone]  *Please DO NOT go directly from our office to pick up this medication! Give the pharmacy 1 day to process the prescription as this is compounded and takes time to make.   Start Miralax  1 capful twice a day until having regular, soft stools, then decrease to once daily.   Continue daily stool softeners.   _______________________________________________________  If your blood pressure at your visit was 140/90 or greater, please contact your primary care physician to follow up on this.  _______________________________________________________  If you are age 75 or older, your body mass index should be between 23-30. Your Body mass index is 32.81 kg/m. If this is out of the aforementioned range listed, please consider follow up with your Primary Care Provider.  If you are age 75 or younger, your body mass index should be between 19-25. Your Body mass index is 32.81 kg/m. If this is out of the aformentioned range listed, please consider follow up with your Primary Care Provider.   ________________________________________________________  The [facility] providers would like to encourage you to use MYCHART to communicate with providers for non-urgent requests or questions.  Due to long hold times on the telephone, sending your provider a message by [facility] may be a faster and more efficient way to get a response.  Please allow 48 business hours for a response.  Please remember that this is for non-urgent  requests.  _______________________________________________________  [facility] is using a team-based approach to care.  Your team is made up of your doctor and two to three APPS. Our APPS (Nurse Practitioners and Physician Assistants) work with your physician to ensure care continuity for you. They are fully qualified to address your health concerns and develop a treatment plan. They communicate directly with your gastroenterologist to care for you. Seeing the Advanced Practice Practitioners on your physician's team can help you by facilitating care more promptly, often allowing for earlier appointments, access to diagnostic testing, procedures, and other specialty referrals.

## 2024-03-16 NOTE — Patient Instructions (Signed)
 Medication Instructions:  No changes *If you need a refill on your cardiac medications before your next appointment, please call your pharmacy*  Lab Work: None ordered If you have labs (blood work) drawn today and your tests are completely normal, you will receive your results only by: MyChart Message (if you have MyChart) OR A paper copy in the mail If you have any lab test that is abnormal or we need to change your treatment, we will call you to review the results.  Testing/Procedures: None ordered  Follow-Up: At [facility], you and your health needs are our priority.  As part of our continuing mission to provide you with exceptional heart care, our providers are all part of one team.  This team includes your primary Cardiologist (physician) and Advanced Practice Providers or APPs (Physician Assistants and Nurse Practitioners) who all work together to provide you with the care you need, when you need it.  Your next appointment:   6 month(s)  Provider:   Jerel Balding, MD    We recommend signing up for the patient portal called MyChart.  Sign up information is provided on this After Visit Summary.  MyChart is used to connect with patients for Virtual Visits (Telemedicine).  Patients are able to view lab/test results, encounter notes, upcoming appointments, etc.  Non-urgent messages can be sent to your provider as well.   To learn more about what you can do with MyChart, go to [email or web address].

## 2024-03-17 ENCOUNTER — Other Ambulatory Visit (HOSPITAL_COMMUNITY): Payer: Self-pay

## 2024-03-17 ENCOUNTER — Telehealth: Payer: Self-pay | Admitting: Gastroenterology

## 2024-03-17 NOTE — Telephone Encounter (Signed)
 Inbound call from pt stating that she received a letter from her insurance company called [employer] x preferred. Patient was prescribed some suppositories and is now needing help from us  in order for her insurance to approve that medication for her. Best number to reach her insurance is [phone]. Please advise.

## 2024-03-17 NOTE — Telephone Encounter (Signed)
 Likely can be covered via [facility] Would try have her try it that way without using medicare insurance JMP

## 2024-03-17 NOTE — Telephone Encounter (Signed)
 I spoke with the pt and she will try [employer] [facility] and pick up the suppositories.  With [facility] the cost is around $20-25

## 2024-03-17 NOTE — Telephone Encounter (Signed)
 This is a DESI drug and is a Part D Exclusion. Insurance will not pay for this medication.

## 2024-03-18 ENCOUNTER — Encounter (HOSPITAL_COMMUNITY)

## 2024-03-20 NOTE — Progress Notes (Signed)
 Cardiology Office Note:  .   Date:  03/20/2024  ID:  Amber Bennett, DOB [birth year], MRN [id number] PCP: Amber Anes, MD  [facility] Providers Cardiologist:  Amber Balding, MD Electrophysiologist:  Amber Gladis Norton, MD    History of Present Illness: Amber   LUDELLA Bennett is a 73 y.o. female with paroxysmal and persistent atrial fibrillation with rapid ventricular response complicated by acute exacerbation of diastolic heart failure on a background history of hypertension, hypercholesterolemia, mild obesity, obstructive sleep apnea, dermatomyositis, type 2 diabetes mellitus.  She had a good response to atrial fibrillation ablation in September 2024.  Right heart catheterization performed by Amber Bennett on 11/27/2023 showed normal left heart filling pressures at rest (mean PCW 11 mmHg, PA pressure 35/13), but substantial increase in pulmonary artery wedge pressureeven with low-level exercise at stage I (mean 27 mmHg, V wave 40 mmHg and PA pressure 76/15 with a mean of 47 mmHg).  At peak exercise the wedge pressure reached 32 mmHg.  There was no evidence of pulmonary venous stenosis by sampling wedge pressure in all quadrants.  There was also a substantial hypertensive response to exercise and mild exertional hypoxia.  Treated with diuretics, antihypertensives including Entresto  and metoprolol , referred to pulmonary rehab.   She felt that she was making excellent progress with pulmonary rehab and that her exercise tolerance and quality of life was improving.  Then unfortunately she had a mechanical fall after slipping in the bathroom and has a thoracic spine fracture in early September (T9 inferior endplate fracture).  She is wearing a back brace.  Still has a lot of pain, including when she tries to take a deep breath.  Today she weighs very close to what we have estimated to be her dry weight of 155 pounds.  She has not had recent problems with symptoms of atrial fibrillation.  She  is on chronic anticoagulation with Eliquis  and fortunately did not have any serious bleeding with her fall.  She reports compliance with CPAP.   - Chest CT showed right upper lobe consolidation and some clustered nodularity within the anterior medial left upper lung, right middle lobe peripheral bronchiolectasis and focal mucoid impaction, tree-in-bud nodularity of the right middle lobe and the lingula.   - Bronchoscopy 09/19/2023, scanty secretions, mild erythema of the right middle lobe and lingula airways, no endobronchial lesions.  The cytology specimens showed no malignant cells, mixed acute and chronic inflammation.  Aspergillus was found in her BAL, but felt to be nonpathogenic colonizer (Amber Bennett) - Extensive serology for autoimmune disease has shown a very weakly positive ANA at 1: 40, negative for more specific autoantibodies.  ESR (9-14) and CRP have not been markedly elevated.   - Pulmonary function tests showed mildly reduced FVC and a mildly reduced DLCO (e.g. roughly 70% of predicted), with normal FEV1 and actually increased FEV1/FVC ratio. - Echocardiogram with saline contrast performed 11/13/2023 did not show any evidence of right-to-left shunt.  Left ventricular systolic function is normal (EF 60 to 65%, global longitudinal strain -23.3%).  Diastolic parameters show pseudonormal mitral inflow based on E/A ratio of 1.2, but the E/e' is at most minimally abnormal (medial 12, lateral 9).  The left atrium is only mildly dilated.  There are no serious valvular abnormalities.  There is no pericardial effusion.  The right ventricular function is normal and the pulmonary artery pressure could not be estimated. - Cardiac catheterization 11/27/2023 with monitoring of pulmonary capillary wedge pressure and pulmonary pressure showed  a marked hypertensive response to exercise and dramatic increase in PA wedge pressure and PA pressure with activity.  She has a history of intolerance to statins due  to elevated liver function tests, skin rashes and myalgia.  Multiple agents have been tried.  She has had an excellent response to Praluent  and her most recent LDL cholesterol was 16.  She has good glycemic control with a hemoglobin A1c of 6.7% on Zepbound .   No evidence of ischemia on Lexiscan  Myoview  July 2024.  She had a coronary calcium score of 0 in 2019      Studies Reviewed: Amber        EKG Interpretation Date/Time:    Ventricular Rate:    PR Interval:    QRS Duration:    QT Interval:    QTC Calculation:   R Axis:      Text Interpretation:         Right heart catheterization with stress testing 11/27/2023  Resting:   BP = 160/69 RA = 5 RV = 34/6 PA = 35/13 RML PCW = 11  RU PCW = 8 LL PCW = 11 Fick cardiac output/index = 6.7/4.0 Thermo CO/CI = 6.5/3.9 PVR = 2.20 WU Ao sat = 98% PA sat = 78%, 78% High SVC sat = 79% PAPi = 4.4   LUL PA angiography: Small system without obvious clot   Unloaded cycling (0W):   PA = 46/19 (32) PCW = 21 (v=30)  Thermo CO/CI = n/a Systemic BP = 163/102 (115)   Stage 1 cycling (20 W):   PA = 76/15 (47) PCW = 27  (v=40)  Thermo CO/CI = 9.5/5.6 Systemic BP = 163/102 (115)     Stage 2 cycling (40 W):   PA = 73/31 (51) PCW = 34  (v=49)  Thermo CO/CI = 9.7/5.7 Systemic BP = 177/110 (120)     Stage 3 cycling (60 W):   PA = 70/28 (52) PCW = 32  (v=44)  Thermo CO/CI = 12.0/7.1 Systemic BP = 177/110 (120) O2 Sat 91%    Echocardiogram 11/13/2023  1. Left ventricular ejection fraction, by estimation, is 60 to 65%. Left  ventricular ejection fraction by 3D volume is 64 %. The left ventricle has  normal function. The left ventricle has no regional wall motion  abnormalities. Left ventricular diastolic   parameters are consistent with Grade II diastolic dysfunction  (pseudonormalization). The average left ventricular global longitudinal  strain is -23.3 %. The global longitudinal strain is normal.   2. Right  ventricular systolic function is normal. The right ventricular  size is normal. Tricuspid regurgitation signal is inadequate for assessing  PA pressure.   3. Left atrial size was mildly dilated.   4. Negative bubble study, no evidence for PFO or ASD.   5. The mitral valve is normal in structure. Trivial mitral valve  regurgitation. No evidence of mitral stenosis.   6. The aortic valve is tricuspid. There is mild calcification of the  aortic valve. Aortic valve regurgitation is trivial. No aortic stenosis is  present.   7. The inferior vena cava is normal in size with greater than 50%  respiratory variability, suggesting right atrial pressure of 3 mmHg.   Diastology  LV e' medial:    7.07 cm/s  LV E/e' medial:  12.1  LV e' lateral:   9.36 cm/s  LV E/e' lateral: 9.1  MV Decel Time: 182 msec     MV E velocity: 85.45 cm/s  MV A velocity: 72.05  cm/s  MV E/A ratio:  1.19    Lexiscan  Myoview  01/20/2023:   No ischemia. There is a small mild fixed mid-anterior defect that is most consistent with breast attenuation given normal wall motion. The study is overall low risk due to grossly normal perfusion, normal LV function and no TID. Patient was symptomatic after stress testing, see separate documentation from Dr. Francyne. If clinical concern persists, consider cardiac PET-CT with myocardial blood flow or coronary CTA.   No ST deviation was noted.   LV perfusion is abnormal. Defect 1: There is a small defect with mild reduction in uptake present in the mid anterior location(s) that is fixed. There is normal wall motion in the defect area. Consistent with artifact caused by breast attenuation.   Left ventricular function is normal. Nuclear stress EF: 69%. The left ventricular ejection fraction is hyperdynamic (>65%). End diastolic cavity size is normal. End systolic cavity size is normal.   Prior study not available for comparison.  Risk Assessment/Calculations:    CHA2DS2-VASc Score = 6  The  patient's score is based upon: CHF History: 1 HTN History: 1 Diabetes History: 1 Stroke History: 0 Vascular Disease History: 0 Age Score: 2 Gender Score: 1            Physical Exam:   VS:  BP (!) 106/50 (BP Location: Left Arm, Patient Position: Sitting, Cuff Size: Normal)   Pulse 74   Ht 4' 10 (1.473 m)   Wt 158 lb 3.2 oz (71.8 kg)   LMP 07/01/1980   SpO2 98%   BMI 33.06 kg/m    Wt Readings from Last 3 Encounters:  03/16/24 157 lb (71.2 kg)  03/16/24 158 lb 3.2 oz (71.8 kg)  02/26/24 157 lb 6.5 oz (71.4 kg)     General: Alert, oriented x3, no distress, mildly obese Head: no evidence of trauma, PERRL, EOMI, no exophtalmos or lid lag, no myxedema, no xanthelasma; normal ears, nose and oropharynx Neck: 6 cm elevation in jugular venous pulsations ; brisk carotid pulses without delay and no carotid bruits Chest: clear to auscultation, no signs of consolidation by percussion or palpation, normal fremitus, symmetrical and full respiratory excursions Cardiovascular: normal position and quality of the apical impulse, regular rhythm, normal first and second heart sounds, no murmurs, rubs or gallops Abdomen: no tenderness or distention, no masses by palpation, no abnormal pulsatility or arterial bruits, normal bowel sounds, no hepatosplenomegaly Extremities: 1+ symmetrical edema of the feet and ankles bilaterally; 2+ radial, ulnar and brachial pulses bilaterally; 2+ right femoral, posterior tibial and dorsalis pedis pulses; 2+ left femoral, posterior tibial and dorsalis pedis pulses; no subclavian or femoral bruits Neurological: grossly nonfocal Psych: Normal mood and affect   ASSESSMENT AND PLAN: .   CHF: Appears to be clinically euvolemic today and is close to her previous estimation of her dry weight of 155-159 pounds.  She was having substantial improvement in functional status until her recent fall and spinal fracture.  Previous episodes of overt heart failure decompensation  occurred in association with atrial fibrillation with rapid ventricular response and immediately following cardioversion.  Although she has excellent hemodynamics at rest, her exercise Right heart catheterization clearly shows a with increased cardiac output she has a dramatic increase in PAWP and PAP which explains her exertional dyspnea.  Seem to improve on the most recent medication regimen and we did not make any changes today.  Amber have to wait until she begins recovering from the spinal problems to reassess her functional status  AFib: Excellent response to ablation.  Remains on anticoagulation but is no longer on antiarrhythmics.  No evidence of pulmonary vein stenosis at catheterization.. Anticoagulation: Tolerating well without bleeding complications, but prevents her from taking NSAIDs for her arthritis.  May have to consider a Watchman device in the future. HTN: On Entresto  and metoprolol  her blood pressure is actually quite low.  Need to make sure that the telmisartan is no longer an active medication but she is on Entresto . OSA: Reports 100% compliance with CPAP. Obesity: BMI is in mild obesity range.  Trying to lose weight with GLP-1 agonists and exercise. Dermatomyositis: Serologies essentially negative with the exception of borderline ANA 1: 40 and inflammatory markers are low. ILD: Restrictive lung disease and reduced diffusion capacity.  Cause is elusive.  Lung findings were fairly bland on chest CT performed 01/18/2023, with abnormalities developing on the CT from 08/04/2023 and evolving on the CT 09/12/2023.  Let's make sure that she is no longer taking amiodarone .  Possibly Amber need a lung biopsy.  It would be interesting to see if we can coordinate this with her right heart catheterization so that interruption in her anticoagulant is minimized. Aortic atherosclerosis/coronary calcifications: Described on the most recent chest CT from March 2025.  I looked at the images and think that  the scanty calcifications are primarily at the level of the aortic annulus and aortic valve, not in the coronaries.  Does have mild atherosclerotic plaque in the proximal aortic arch at the level of the great vessels origins and in the descending thoracic aorta normal nuclear stress test less than a year ago and coronary calcium score was 0 in 2019. HLP: Statin intolerant due to elevated LFTs and myopathy.  Exceptional LDL reduction on Praluent  monotherapy.  Chronically low HDL.       Patient Instructions  Medication Instructions:  No changes *If you need a refill on your cardiac medications before your next appointment, please call your pharmacy*  Lab Work: None ordered If you have labs (blood work) drawn today and your tests are completely normal, you Amber receive your results only by: MyChart Message (if you have MyChart) OR A paper copy in the mail If you have any lab test that is abnormal or we need to change your treatment, we Amber call you to review the results.  Testing/Procedures: None ordered  Follow-Up: At [facility], you and your health needs are our priority.  As part of our continuing mission to provide you with exceptional heart care, our providers are all part of one team.  This team includes your primary Cardiologist (physician) and Advanced Practice Providers or APPs (Physician Assistants and Nurse Practitioners) who all work together to provide you with the care you need, when you need it.  Your next appointment:   6 month(s)  Provider:   Jerel Balding, MD    We recommend signing up for the patient portal called MyChart.  Sign up information is provided on this After Visit Summary.  MyChart is used to connect with patients for Virtual Visits (Telemedicine).  Patients are able to view lab/test results, encounter notes, upcoming appointments, etc.  Non-urgent messages can be sent to your provider as well.   To learn more about what you can do with  MyChart, go to [email or web address].     Signed, Amber Balding, MD

## 2024-03-24 DIAGNOSIS — M2559 Pain in other specified joint: Secondary | ICD-10-CM | POA: Diagnosis not present

## 2024-03-24 DIAGNOSIS — Z6832 Body mass index (BMI) 32.0-32.9, adult: Secondary | ICD-10-CM | POA: Diagnosis not present

## 2024-03-24 DIAGNOSIS — M25511 Pain in right shoulder: Secondary | ICD-10-CM | POA: Diagnosis not present

## 2024-03-24 DIAGNOSIS — R768 Other specified abnormal immunological findings in serum: Secondary | ICD-10-CM | POA: Diagnosis not present

## 2024-03-24 DIAGNOSIS — M1991 Primary osteoarthritis, unspecified site: Secondary | ICD-10-CM | POA: Diagnosis not present

## 2024-03-24 DIAGNOSIS — M797 Fibromyalgia: Secondary | ICD-10-CM | POA: Diagnosis not present

## 2024-03-24 DIAGNOSIS — M79641 Pain in right hand: Secondary | ICD-10-CM | POA: Diagnosis not present

## 2024-03-24 DIAGNOSIS — J479 Bronchiectasis, uncomplicated: Secondary | ICD-10-CM | POA: Diagnosis not present

## 2024-03-24 DIAGNOSIS — E669 Obesity, unspecified: Secondary | ICD-10-CM | POA: Diagnosis not present

## 2024-03-24 DIAGNOSIS — M79642 Pain in left hand: Secondary | ICD-10-CM | POA: Diagnosis not present

## 2024-03-24 DIAGNOSIS — M3390 Dermatopolymyositis, unspecified, organ involvement unspecified: Secondary | ICD-10-CM | POA: Diagnosis not present

## 2024-03-31 ENCOUNTER — Ambulatory Visit: Admitting: Pulmonary Disease

## 2024-03-31 ENCOUNTER — Encounter: Payer: Self-pay | Admitting: Pulmonary Disease

## 2024-03-31 VITALS — BP 136/62 | HR 78 | Ht <= 58 in | Wt 146.0 lb

## 2024-03-31 DIAGNOSIS — M3310 Other dermatopolymyositis, organ involvement unspecified: Secondary | ICD-10-CM | POA: Diagnosis not present

## 2024-03-31 DIAGNOSIS — J9611 Chronic respiratory failure with hypoxia: Secondary | ICD-10-CM

## 2024-03-31 DIAGNOSIS — E669 Obesity, unspecified: Secondary | ICD-10-CM | POA: Diagnosis not present

## 2024-03-31 DIAGNOSIS — J479 Bronchiectasis, uncomplicated: Secondary | ICD-10-CM | POA: Diagnosis not present

## 2024-03-31 DIAGNOSIS — G4733 Obstructive sleep apnea (adult) (pediatric): Secondary | ICD-10-CM

## 2024-03-31 NOTE — Progress Notes (Signed)
 Synopsis: Referred in February 2024 for Shortness of Breath  Subjective:   PATIENT ID: Amber Bennett GENDER: female DOB: 1950/10/03, MRN: [id number]  HPI  Chief Complaint  Patient presents with   Medical Management of Chronic Issues   Amber Bennett is a 73 year old woman, former smoker with history of dermatomyositis, DMII, atrial fibrillation s/p ablation and hypertension who returns to pulmonary clinic for pulmonary hypertension and hypoxemic respiratory failure.   She had RHC 10/2023 with note of normal RHC at rest but with exercise there was marked increase in systolic BP and mean PCWP confirming exertional pulmonary hypertension in setting of severe diastolic dysfunction. She was started on entresto .   She suffered a fall in early September with inferior endplate fracture of T9. She is currently wearing a back brace for three months and is about three weeks into this period. She had to stop pulmonary rehab due to the injury. Prior to the fall, she was participating in pulmonary rehab and had made significant progress, walking one to three miles a day, three to four days a week. She had regained significant quality of life, such as being able to walk to and from a boat dock without assistance on family vacation. She has weaned off oxygen  during the day. She did not require oxygen  during exercise at pulmonary rehab.  She follows a Mediterranean diet and has lost 20 pounds over the past year, with a goal to lose an additional 20 to 40 pounds. She has been off oxygen  during the day but continues to use it at night while sleeping.  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   Carpal tunnel syndrome    Cervical radiculitis    Chronic diarrhea 10/24/2023   Collar bone fracture    Depression    Dermatomyositis (HCC)    Diabetes mellitus without  complication (HCC)    Dysrhythmia    Elevated liver enzymes    External hemorrhoids    Fatty liver    Fibromyalgia    Fibromyalgia affecting hand 08/2022   bilateral wrist and hands   Hematuria    negative evaluation   High cholesterol    HTN (hypertension)    Internal hemorrhoids    Laryngitis    Lower back pain    Macular degeneration    Nonalcoholic steatohepatitis (NASH)    Nonalcoholic steatohepatitis (NASH)    Osteopenia    Left side hip   Seizures (HCC)    09/07/2019 pt reports last seizure in 2014   Tubular adenoma of colon      Family History  Problem Relation Age of Onset   Cancer Mother        Cancer of the bone marrow   Heart attack Mother    Stroke Sister    Hypertension Sister    Diabetes Sister    Colon cancer Maternal Aunt    Seizures Neg Hx    Esophageal cancer Neg Hx    Rectal cancer Neg Hx    Stomach cancer Neg Hx    Breast cancer Neg Hx    BRCA 1/2 Neg Hx      Social History   Socioeconomic History   Marital status: Married    Spouse name: Not on file   Number of children: 2   Years of education: 7  Highest education level: Not on file  Occupational History   Occupation: Employed    Comment: Works for [employer], [place] and [employer]    Comment: Retired  Tobacco Use   Smoking status: Former    Current packs/day: 1.50    Average packs/day: 1.5 packs/day for 45.7 years (68.6 ttl pk-yrs)    Types: Cigarettes    Start date: 07/01/1978   Smokeless tobacco: Never   Tobacco comments:    Former smoker 04/10/23  Vaping Use   Vaping status: Never Used  Substance and Sexual Activity   Alcohol  use: Yes    Alcohol /week: 7.0 standard drinks of alcohol     Types: 7 Glasses of wine per week    Comment: 1 glass of white wine nightly 04/10/23   Drug use: No   Sexual activity: Yes    Partners: Male    Birth control/protection: Surgical    Comment: TAH  Other Topics Concern   Not on file  Social History Narrative   Patient lives at home with  her husband Amber Bennett).   Retired.   Education two years of business college.   Right handed.   Caffeine     Social Drivers of Corporate investment banker Strain: Low Risk  (06/17/2018)   Received from [facility]   Overall Financial Resource Strain (CARDIA)    Difficulty of Paying Living Expenses: Not hard at all  Food Insecurity: No Food Insecurity (06/17/2018)   Received from [facility]   Hunger Vital Sign    Within the past 12 months, you worried that your food would run out before you got the money to buy more.: Never true    Within the past 12 months, the food you bought just didn't last and you didn't have money to get more.: Never true  Transportation Needs: No Transportation Needs (06/17/2018)   Received from [facility] - Transportation    In the past 12 months, has lack of transportation kept you from medical appointments or from getting medications?: No    Lack of Transportation (Non-Medical): No  Physical Activity: Not on file  Stress: No Stress Concern Present (06/17/2018)   Received from [facility] - Occupational Stress Questionnaire    Feeling of Stress : Only a little  Social Connections: Unknown (06/17/2018)   Received from [facility]   Social Connection and Isolation Panel    Frequency of Communication with Friends and Family: Not on file    Frequency of Social Gatherings with Friends and Family: Not on file    Attends Religious Services: Not on file    Active Member of Clubs or Organizations: Not on file    Attends Banker Meetings: Not on file    Are you married, widowed, divorced, separated, never married, or living with a partner?: Married  Intimate Partner Violence: Not on file     Allergies  Allergen Reactions   Aspirin  Other (See Comments)    gi bleeding in 2005   Cefdinir Other (See Comments)     Other Reaction(s): hives, itching   Colesevelam Other (See Comments)    Other Reaction(s): has never tried it but would not be a good choice as all seizure meds would have to be taken at least four hours prior to the dose and with her regimen that just won't be possible.   Ezetimibe -Simvastatin Other (See Comments)  Other Reaction(s): elevated LFT   Penicillin G Benzathine Itching   Rosuvastatin     Other Reaction(s): skin rash and myalgia   Wound Dressing Adhesive Other (See Comments)   Latex Itching    Rash      Outpatient Medications Prior to Visit  Medication Sig Dispense Refill   nystatin cream (MYCOSTATIN) Apply 1 Application topically 2 (two) times daily.     acetaminophen  (TYLENOL ) 500 MG tablet Take 500 mg by mouth every 6 (six) hours as needed for moderate pain (pain score 4-6) or mild pain (pain score 1-3). (Patient not taking: Reported on 03/16/2024)     Alirocumab  (PRALUENT ) 150 MG/ML SOAJ Inject 1 mL (150 mg total) into the skin every 14 (fourteen) days. (Patient not taking: Reported on 03/16/2024) 6 mL 3   AMBULATORY NON FORMULARY MEDICATION Medication name: Diltiazem  2% with 5% lidocaine  Using your index finger, apply a small amount of medication inside the rectum up to your first knuckle/joint 2-3 times daily for 6-8 weeks. 30 g 1   amLODipine  (NORVASC ) 2.5 MG tablet Take 1 tablet (2.5 mg total) by mouth at bedtime. 90 tablet 3   apixaban  (ELIQUIS ) 5 MG TABS tablet TAKE 1 TABLET(5 MG) BY MOUTH TWICE DAILY 180 tablet 2   Ascorbic Acid (VITAMIN C) 1000 MG tablet Take 1,000 mg by mouth daily. Airborne emergen c (Patient not taking: Reported on 03/16/2024)     beclomethasone (QVAR  REDIHALER) 80 MCG/ACT inhaler Inhale 1 puff into the lungs 2 (two) times daily. (Patient not taking: Reported on 03/16/2024) 31.8 g 1   Calcium Carb-Cholecalciferol  (CALTRATE 600+D3 PO) Take 1 tablet by mouth daily.     Cholecalciferol  (VITAMIN D ) 50 MCG (2000 UT) tablet Take 2,000 Units by mouth  daily.     ciprofloxacin  (CIPRO ) 500 MG tablet Take 500 mg by mouth 2 (two) times daily.     clotrimazole (LOTRIMIN) 1 % cream Apply 1 Application topically 2 (two) times daily.     Coenzyme Q10 (CO Q 10 PO) Take 200 mg by mouth daily.     colestipol  (COLESTID ) 1 g tablet TAKE 2 TABLETS(2 GRAMS) BY MOUTH AT BEDTIME 180 tablet 0   cyclobenzaprine  (FLEXERIL ) 5 MG tablet Take 1 tablet (5 mg total) by mouth every 8 (eight) hours as needed. (Patient not taking: Reported on 03/16/2024) 30 tablet 1   diphenoxylate -atropine  (LOMOTIL ) 2.5-0.025 MG tablet Take 1-2 tablets by mouth every 4 hours as needed for diarrhea/loose stools 90 tablet 1   DULoxetine  (CYMBALTA ) 30 MG capsule Take 1 capsule (30 mg total) by mouth every evening. 90 capsule 4   ezetimibe  (ZETIA ) 10 MG tablet Take 10 mg by mouth every evening.     fluticasone  (FLONASE ) 50 MCG/ACT nasal spray Place 2 sprays into both nostrils daily.     furosemide  (LASIX ) 40 MG tablet Take 2 tablets (80 mg total) by mouth daily. 180 tablet 3   gabapentin  (NEURONTIN ) 800 MG tablet Take 1 tablet (800 mg total) by mouth 2 (two) times daily. 180 tablet 3   HYDROcodone-acetaminophen  (NORCO/VICODIN) 5-325 MG tablet Take 1-2 tablets by mouth every 4 (four) hours as needed.     hydrocortisone  (ANUSOL -HC) 25 MG suppository Use one at night for 5-7 days 10 suppository 0   ibandronate (BONIVA) 150 MG tablet Take 150 mg by mouth every 30 (thirty) days. (Patient not taking: Reported on 03/16/2024)     ibandronate (BONIVA) 150 MG tablet Take 150 mg by mouth every 30 (thirty) days. Take in the morning with  a full glass of water, on an empty stomach, and do not take anything else by mouth or lie down for the next 30 min.     ipratropium (ATROVENT ) 0.03 % nasal spray Place 2 sprays into both nostrils every 12 (twelve) hours. (Patient not taking: Reported on 03/16/2024) 30 mL 4   ipratropium-albuterol  (DUONEB) 0.5-2.5 (3) MG/3ML SOLN Take 3 mLs by nebulization 3 (three) times  daily as needed. (Patient not taking: Reported on 03/16/2024) 360 mL 5   loratadine  (CLARITIN ) 10 MG tablet Take 1 tablet (10 mg total) by mouth daily as needed for allergies (Can take an extra dose during flare ups.). 90 tablet 1   methocarbamol  (ROBAXIN ) 500 MG tablet Take 1.5 tablets by mouth 2 (two) times daily.     metoprolol  tartrate (LOPRESSOR ) 50 MG tablet Take 1 tablet (50 mg total) by mouth 2 (two) times daily.     metroNIDAZOLE  (FLAGYL ) 250 MG tablet Take 250 mg by mouth 3 (three) times daily.     mometasone  (ASMANEX ) 220 MCG/ACT inhaler Inhale 1 puff into the lungs 2 (two) times daily. (Patient not taking: Reported on 03/16/2024) 3 each 1   montelukast  (SINGULAIR ) 10 MG tablet Take 10 mg by mouth as needed.     Multiple Vitamins-Minerals (MULTIVITAMIN PO) Take 1 tablet by mouth daily.     Multiple Vitamins-Minerals (PRESERVISION AREDS PO) Take 1 capsule by mouth daily. (Patient not taking: Reported on 03/16/2024)     Olopatadine-Mometasone  (RYALTRIS ) 665-25 MCG/ACT SUSP Place 2 sprays into the nose 2 (two) times daily. (Patient not taking: Reported on 03/16/2024) 87 g 1   omeprazole (PRILOSEC) 20 MG capsule Take 20 mg by mouth 2 (two) times daily before a meal.  3   PHENobarbital  (LUMINAL) 97.2 MG tablet Take 1.5 tablets (145.8 mg total) by mouth daily. 135 tablet 2   potassium chloride  SA (KLOR-CON  M) 20 MEQ tablet While taking 40 mg of Furosemide , take Potassium Chloride  20 meq twice a day. When you decrease Furosemide  to 20 mg, decrease Potassium Chloride  to 20 meq once a day. 180 tablet 3   PRALUENT  150 MG/ML SOAJ Inject 150 mg into the skin every 14 (fourteen) days.     pyridoxine  (B-6) 100 MG tablet Take 100 mg by mouth daily. (Patient not taking: Reported on 03/16/2024)     sacubitril-valsartan (ENTRESTO ) 49-51 MG Take 1 tablet by mouth 2 (two) times daily.     sodium chloride  (OCEAN) 0.65 % nasal spray Place 1 spray into the nose as needed.     topiramate  (TOPAMAX ) 50 MG tablet TAKE 1  TABLET BY MOUTH IN THE MORNIGN& 2 TABS EVERY EVENING 270 tablet 1   vitamin E 400 UNIT capsule Take 400 Units by mouth daily.     ZEPBOUND  2.5 MG/0.5ML Pen Inject 2.5 mg into the skin once a week. 7.5 mg she is taking     No facility-administered medications prior to visit.   Review of Systems  Constitutional:  Negative for chills, fever, malaise/fatigue and weight loss.  HENT:  Negative for congestion, sinus pain and sore throat.   Eyes: Negative.   Respiratory:  Positive for shortness of breath. Negative for cough, hemoptysis, sputum production and wheezing.   Cardiovascular:  Negative for chest pain, palpitations, orthopnea, claudication and leg swelling.  Gastrointestinal:  Negative for abdominal pain, heartburn, nausea and vomiting.  Genitourinary: Negative.   Musculoskeletal:  Positive for joint pain. Negative for myalgias.  Skin:  Negative for rash.  Neurological:  Negative for weakness.  Endo/Heme/Allergies: Negative.   Psychiatric/Behavioral: Negative.     Objective:   Vitals:   03/31/24 1559  BP: 136/62  Pulse: 78  SpO2: 92%  Weight: 146 lb (66.2 kg)  Height: 4' 10 (1.473 m)     Physical Exam Constitutional:      General: She is not in acute distress.    Appearance: Normal appearance.  Eyes:     General: No scleral icterus.    Conjunctiva/sclera: Conjunctivae normal.  Cardiovascular:     Rate and Rhythm: Normal rate and regular rhythm.  Pulmonary:     Breath sounds: No wheezing, rhonchi or rales.  Musculoskeletal:     Right lower leg: No edema.     Left lower leg: No edema.  Skin:    General: Skin is warm and dry.  Neurological:     General: No focal deficit present.    CBC    Component Value Date/Time   WBC 6.1 11/19/2023 1225   WBC 6.0 10/24/2023 1108   RBC 4.03 11/19/2023 1225   RBC 4.28 10/24/2023 1108   HGB 11.2 (L) 11/27/2023 0844   HGB 13.0 11/19/2023 1225   HCT 33.0 (L) 11/27/2023 0844   HCT 38.3 11/19/2023 1225   PLT 164 11/19/2023  1225   MCV 95 11/19/2023 1225   MCH 32.3 11/19/2023 1225   MCH 32.5 10/24/2023 1108   MCHC 33.9 11/19/2023 1225   MCHC 34.1 10/24/2023 1108   RDW 11.6 (L) 11/19/2023 1225   LYMPHSABS 1.6 07/22/2023 1524   EOSABS 198 10/24/2023 1108   EOSABS 0.1 07/22/2023 1524   BASOSABS 48 10/24/2023 1108   BASOSABS 0.1 07/22/2023 1524      Latest Ref Rng & Units 12/09/2023    1:43 PM 11/27/2023    8:44 AM 11/27/2023    8:22 AM  BMP  Glucose 70 - 99 mg/dL 82     BUN 8 - 23 mg/dL 22     Creatinine 9.55 - 1.00 mg/dL 9.32     Sodium 864 - 854 mmol/L 132  137  135   Potassium 3.5 - 5.1 mmol/L 3.5  3.4  3.6   Chloride 98 - 111 mmol/L 97     CO2 22 - 32 mmol/L 26     Calcium 8.9 - 10.3 mg/dL 9.2      Chest imaging: CT Chest w/o contrast 09/11/23 1. Interval resolution of airspace consolidation within the basilar right upper lobe. In the area of the previous consolidative change there is a cluster of lung nodules which measure up to 5 mm. The clustered appearance of these nodules strongly suggest sequelae of an inflammatory or infectious process. Most likely chronic atypical infection such as MAI 2. Interval resolution of clustered nodularity within the anteromedial left upper lobe. 3. Stable appearance of peripheral bronchiolectasis with peripheral clustered tree-in-bud nodularity within the right middle lobe and posterior lingula. Findings are also favored to represent sequelae of chronic atypical mycobacterial infection. 4. Stable 5 mm peripheral subpleural nodule in the posterolateral right middle lobe. 5. Coronary artery calcifications noted. 6.  Aortic Atherosclerosis (ICD10-I70.0).  HRCT Chest 07/31/23 1. New clustered heterogeneous airspace opacity and nodularity in the inferior right upper lobe measuring 2.4 x 1.6 cm. Additional tiny nodules and small consolidations in the medial left upper lobe and in the right middle lobe. Findings are most consistent with multifocal atypical  infection. 2. Mild, bland appearing scarring of the lung bases. No evidence of fibrotic interstitial lung disease. 3. Hepatic steatosis. Coarse contour of  the liver, suggestive of cirrhosis. 4. Subacute, partially callused fracture of the anterior right fifth rib.  CT Chest 03/05/23 Cardiovascular: No significant vascular findings. Normal heart size. No pericardial effusion.   Mediastinum/Nodes: No enlarged mediastinal or axillary lymph nodes. Thyroid  gland, trachea, and esophagus demonstrate no significant findings.   Lungs/Pleura: There is mild bibasilar atelectasis. Minimal ground-glass opacities in the right upper lung may represent scarring from prior inflammatory changes in this area. No pleural effusion or pneumothorax.  CTA Chest PE 02/18/22 Cardiovascular: Satisfactory opacification of the pulmonary arteries to the segmental level. No evidence of pulmonary embolism. Normal heart size. No pericardial effusion. Normal caliber thoracic aorta with mild atherosclerotic disease.   Mediastinum/Nodes: Esophagus and thyroid  are unremarkable. No pathologically enlarged lymph nodes seen in the chest.   Lungs/Pleura: Central airways are patent. Mild right upper lobe consolidation with surrounding ground-glass. No consolidation, pleural effusion or pneumothorax.  PFT:    Latest Ref Rng & Units 09/04/2023    2:36 PM  PFT Results  FVC-Pre L 1.60   FVC-Predicted Pre % 73   FVC-Post L 1.54   FVC-Predicted Post % 70   Pre FEV1/FVC % % 83   Post FEV1/FCV % % 88   FEV1-Pre L 1.34   FEV1-Predicted Pre % 83   FEV1-Post L 1.36   DLCO uncorrected ml/min/mmHg 11.31   DLCO UNC% % 71   DLVA Predicted % 94   TLC L 3.31   TLC % Predicted % 78   RV % Predicted % 64     Labs:  Path:  Echo 01/20/23: LV EF 60-65%. RV size and function is normal. LA mildly dilated. RA normal in size. No atrial level shunt detected by color flow doppler.   Heart Catheterization:       Assessment &  Plan:   Chronic respiratory failure with hypoxia (HCC)  Discussion: Amber Bennett is a 73 year old woman, former smoker with history of dermatomyositis, DMII, atrial fibrillation s/p ablation and hypertension who returns to pulmonary clinic for pulmonary hypertension and hypoxemic respiratory failure.   Pulmonary Hypertension Hypoxemic Respiratory failure Appears to have improved with heart failure regimen and weight loss - Continue night time oxygen  with CPAP - Monitor SpO2 during day and during activity, goal SpO2 88% or greater - Diuresis per cardiology team - Will plan to resume pulmonary rehab once she has recovered from her back injury.  Bronchiectasis, Mild -will plan for follow up CT Chest scan and PFTs in a year  Dermatomyositis History of dermatomyositis with recent symptoms of hand swelling and pain. Currently on Plaquenil .  - She is followed by rheumatology - will monitor closely for possible ILD involvement  Sleep apnea - Continue CPAP therapy with oxygen  - will repeat overnight oxygen  test in the future after further weight loss  Obesity - she is working on weight loss and has been very successful so far - continue zepbound   Follow-up in 3 months  35 minutes spent on this visit.   Dorn Chill, MD [facility] Office: [phone]    Current Outpatient Medications:    nystatin cream (MYCOSTATIN), Apply 1 Application topically 2 (two) times daily., Disp: , Rfl:    acetaminophen  (TYLENOL ) 500 MG tablet, Take 500 mg by mouth every 6 (six) hours as needed for moderate pain (pain score 4-6) or mild pain (pain score 1-3). (Patient not taking: Reported on 03/16/2024), Disp: , Rfl:    Alirocumab  (PRALUENT ) 150 MG/ML SOAJ, Inject 1 mL (150 mg total)  into the skin every 14 (fourteen) days. (Patient not taking: Reported on 03/16/2024), Disp: 6 mL, Rfl: 3   AMBULATORY NON FORMULARY MEDICATION, Medication name: Diltiazem  2% with 5% lidocaine  Using  your index finger, apply a small amount of medication inside the rectum up to your first knuckle/joint 2-3 times daily for 6-8 weeks., Disp: 30 g, Rfl: 1   amLODipine  (NORVASC ) 2.5 MG tablet, Take 1 tablet (2.5 mg total) by mouth at bedtime., Disp: 90 tablet, Rfl: 3   apixaban  (ELIQUIS ) 5 MG TABS tablet, TAKE 1 TABLET(5 MG) BY MOUTH TWICE DAILY, Disp: 180 tablet, Rfl: 2   Ascorbic Acid (VITAMIN C) 1000 MG tablet, Take 1,000 mg by mouth daily. Airborne emergen c (Patient not taking: Reported on 03/16/2024), Disp: , Rfl:    beclomethasone (QVAR  REDIHALER) 80 MCG/ACT inhaler, Inhale 1 puff into the lungs 2 (two) times daily. (Patient not taking: Reported on 03/16/2024), Disp: 31.8 g, Rfl: 1   Calcium Carb-Cholecalciferol  (CALTRATE 600+D3 PO), Take 1 tablet by mouth daily., Disp: , Rfl:    Cholecalciferol  (VITAMIN D ) 50 MCG (2000 UT) tablet, Take 2,000 Units by mouth daily., Disp: , Rfl:    ciprofloxacin  (CIPRO ) 500 MG tablet, Take 500 mg by mouth 2 (two) times daily., Disp: , Rfl:    clotrimazole (LOTRIMIN) 1 % cream, Apply 1 Application topically 2 (two) times daily., Disp: , Rfl:    Coenzyme Q10 (CO Q 10 PO), Take 200 mg by mouth daily., Disp: , Rfl:    colestipol  (COLESTID ) 1 g tablet, TAKE 2 TABLETS(2 GRAMS) BY MOUTH AT BEDTIME, Disp: 180 tablet, Rfl: 0   cyclobenzaprine  (FLEXERIL ) 5 MG tablet, Take 1 tablet (5 mg total) by mouth every 8 (eight) hours as needed. (Patient not taking: Reported on 03/16/2024), Disp: 30 tablet, Rfl: 1   diphenoxylate -atropine  (LOMOTIL ) 2.5-0.025 MG tablet, Take 1-2 tablets by mouth every 4 hours as needed for diarrhea/loose stools, Disp: 90 tablet, Rfl: 1   DULoxetine  (CYMBALTA ) 30 MG capsule, Take 1 capsule (30 mg total) by mouth every evening., Disp: 90 capsule, Rfl: 4   ezetimibe  (ZETIA ) 10 MG tablet, Take 10 mg by mouth every evening., Disp: , Rfl:    fluticasone  (FLONASE ) 50 MCG/ACT nasal spray, Place 2 sprays into both nostrils daily., Disp: , Rfl:    furosemide   (LASIX ) 40 MG tablet, Take 2 tablets (80 mg total) by mouth daily., Disp: 180 tablet, Rfl: 3   gabapentin  (NEURONTIN ) 800 MG tablet, Take 1 tablet (800 mg total) by mouth 2 (two) times daily., Disp: 180 tablet, Rfl: 3   HYDROcodone-acetaminophen  (NORCO/VICODIN) 5-325 MG tablet, Take 1-2 tablets by mouth every 4 (four) hours as needed., Disp: , Rfl:    hydrocortisone  (ANUSOL -HC) 25 MG suppository, Use one at night for 5-7 days, Disp: 10 suppository, Rfl: 0   ibandronate (BONIVA) 150 MG tablet, Take 150 mg by mouth every 30 (thirty) days. (Patient not taking: Reported on 03/16/2024), Disp: , Rfl:    ibandronate (BONIVA) 150 MG tablet, Take 150 mg by mouth every 30 (thirty) days. Take in the morning with a full glass of water, on an empty stomach, and do not take anything else by mouth or lie down for the next 30 min., Disp: , Rfl:    ipratropium (ATROVENT ) 0.03 % nasal spray, Place 2 sprays into both nostrils every 12 (twelve) hours. (Patient not taking: Reported on 03/16/2024), Disp: 30 mL, Rfl: 4   ipratropium-albuterol  (DUONEB) 0.5-2.5 (3) MG/3ML SOLN, Take 3 mLs by nebulization 3 (three) times daily  as needed. (Patient not taking: Reported on 03/16/2024), Disp: 360 mL, Rfl: 5   loratadine  (CLARITIN ) 10 MG tablet, Take 1 tablet (10 mg total) by mouth daily as needed for allergies (Can take an extra dose during flare ups.)., Disp: 90 tablet, Rfl: 1   methocarbamol  (ROBAXIN ) 500 MG tablet, Take 1.5 tablets by mouth 2 (two) times daily., Disp: , Rfl:    metoprolol  tartrate (LOPRESSOR ) 50 MG tablet, Take 1 tablet (50 mg total) by mouth 2 (two) times daily., Disp: , Rfl:    metroNIDAZOLE  (FLAGYL ) 250 MG tablet, Take 250 mg by mouth 3 (three) times daily., Disp: , Rfl:    mometasone  (ASMANEX ) 220 MCG/ACT inhaler, Inhale 1 puff into the lungs 2 (two) times daily. (Patient not taking: Reported on 03/16/2024), Disp: 3 each, Rfl: 1   montelukast  (SINGULAIR ) 10 MG tablet, Take 10 mg by mouth as needed., Disp: , Rfl:     Multiple Vitamins-Minerals (MULTIVITAMIN PO), Take 1 tablet by mouth daily., Disp: , Rfl:    Multiple Vitamins-Minerals (PRESERVISION AREDS PO), Take 1 capsule by mouth daily. (Patient not taking: Reported on 03/16/2024), Disp: , Rfl:    Olopatadine-Mometasone  (RYALTRIS ) 665-25 MCG/ACT SUSP, Place 2 sprays into the nose 2 (two) times daily. (Patient not taking: Reported on 03/16/2024), Disp: 87 g, Rfl: 1   omeprazole (PRILOSEC) 20 MG capsule, Take 20 mg by mouth 2 (two) times daily before a meal., Disp: , Rfl: 3   PHENobarbital  (LUMINAL) 97.2 MG tablet, Take 1.5 tablets (145.8 mg total) by mouth daily., Disp: 135 tablet, Rfl: 2   potassium chloride  SA (KLOR-CON  M) 20 MEQ tablet, While taking 40 mg of Furosemide , take Potassium Chloride  20 meq twice a day. When you decrease Furosemide  to 20 mg, decrease Potassium Chloride  to 20 meq once a day., Disp: 180 tablet, Rfl: 3   PRALUENT  150 MG/ML SOAJ, Inject 150 mg into the skin every 14 (fourteen) days., Disp: , Rfl:    pyridoxine  (B-6) 100 MG tablet, Take 100 mg by mouth daily. (Patient not taking: Reported on 03/16/2024), Disp: , Rfl:    sacubitril-valsartan (ENTRESTO ) 49-51 MG, Take 1 tablet by mouth 2 (two) times daily., Disp: , Rfl:    sodium chloride  (OCEAN) 0.65 % nasal spray, Place 1 spray into the nose as needed., Disp: , Rfl:    topiramate  (TOPAMAX ) 50 MG tablet, TAKE 1 TABLET BY MOUTH IN THE MORNIGN& 2 TABS EVERY EVENING, Disp: 270 tablet, Rfl: 1   vitamin E 400 UNIT capsule, Take 400 Units by mouth daily., Disp: , Rfl:    ZEPBOUND  2.5 MG/0.5ML Pen, Inject 2.5 mg into the skin once a week. 7.5 mg she is taking, Disp: , Rfl:

## 2024-03-31 NOTE — Patient Instructions (Addendum)
 Continue CPAP nightly  Continue supplemental oxygen  for goal of 88% or higher  Continue Qvar  inhaler 1 puff twice daily  Continue entresto  and lasix  per cardiology  Plan to restart Pulmonary rehab once you are cleared from your back brace, please let us  know when to send in new referral order  Follow up in 6 months, call sooner if needed

## 2024-04-02 DIAGNOSIS — H10413 Chronic giant papillary conjunctivitis, bilateral: Secondary | ICD-10-CM | POA: Diagnosis not present

## 2024-04-03 DIAGNOSIS — Z23 Encounter for immunization: Secondary | ICD-10-CM | POA: Diagnosis not present

## 2024-04-06 DIAGNOSIS — Z6831 Body mass index (BMI) 31.0-31.9, adult: Secondary | ICD-10-CM | POA: Diagnosis not present

## 2024-04-06 DIAGNOSIS — M533 Sacrococcygeal disorders, not elsewhere classified: Secondary | ICD-10-CM | POA: Diagnosis not present

## 2024-04-06 DIAGNOSIS — S22070K Wedge compression fracture of T9-T10 vertebra, subsequent encounter for fracture with nonunion: Secondary | ICD-10-CM | POA: Diagnosis not present

## 2024-04-07 ENCOUNTER — Encounter: Payer: Self-pay | Admitting: Internal Medicine

## 2024-04-07 ENCOUNTER — Ambulatory Visit (INDEPENDENT_AMBULATORY_CARE_PROVIDER_SITE_OTHER): Admitting: Internal Medicine

## 2024-04-07 VITALS — BP 112/66 | HR 75 | Ht <= 58 in | Wt 149.1 lb

## 2024-04-07 DIAGNOSIS — Z860101 Personal history of adenomatous and serrated colon polyps: Secondary | ICD-10-CM | POA: Diagnosis not present

## 2024-04-07 DIAGNOSIS — K5900 Constipation, unspecified: Secondary | ICD-10-CM | POA: Diagnosis not present

## 2024-04-07 DIAGNOSIS — K219 Gastro-esophageal reflux disease without esophagitis: Secondary | ICD-10-CM

## 2024-04-07 DIAGNOSIS — K602 Anal fissure, unspecified: Secondary | ICD-10-CM

## 2024-04-07 DIAGNOSIS — K760 Fatty (change of) liver, not elsewhere classified: Secondary | ICD-10-CM

## 2024-04-07 DIAGNOSIS — K649 Unspecified hemorrhoids: Secondary | ICD-10-CM

## 2024-04-07 MED ORDER — HYDROCORTISONE ACETATE 25 MG RE SUPP: RECTAL | 0 refills | Status: DC

## 2024-04-07 NOTE — Patient Instructions (Addendum)
 Continue Colace and Miralax  daily. If your stools are too loose, decrease your Miralax .   You can use Anusol  suppositories every other night x 1-2 weeks. We sent a prescription to your pharmacy.   You can also use the dilatizem gel as needed.   I will contact you when the January schedule becomes available. _______________________________________________________  If your blood pressure at your visit was 140/90 or greater, please contact your primary care physician to follow up on this.  _______________________________________________________  If you are age 54 or older, your body mass index should be between 23-30. Your Body mass index is 31.17 kg/m. If this is out of the aforementioned range listed, please consider follow up with your Primary Care Provider.  If you are age 78 or younger, your body mass index should be between 19-25. Your Body mass index is 31.17 kg/m. If this is out of the aformentioned range listed, please consider follow up with your Primary Care Provider.   ________________________________________________________  The [facility] providers would like to encourage you to use MYCHART to communicate with providers for non-urgent requests or questions.  Due to long hold times on the telephone, sending your provider a message by [facility] may be a faster and more efficient way to get a response.  Please allow 48 business hours for a response.  Please remember that this is for non-urgent requests.  _______________________________________________________  [facility] is using a team-based approach to care.  Your team is made up of your doctor and two to three APPS. Our APPS (Nurse Practitioners and Physician Assistants) work with your physician to ensure care continuity for you. They are fully qualified to address your health concerns and develop a treatment plan. They communicate directly with your gastroenterologist to care for you. Seeing the [facility]  Practitioners on your physician's team can help you by facilitating care more promptly, often allowing for earlier appointments, access to diagnostic testing, procedures, and other specialty referrals.

## 2024-04-07 NOTE — Progress Notes (Signed)
 Subjective:    Patient ID: Amber Bennett, female    DOB: Jul 01, 1951, 73 y.o.   MRN: [id number]  HPI Amber Bennett is a 73 year old female with a history of adenomatous polyps, prior IBS-D, hepatic steatosis/MASLD, GERD, history of C. difficile, history of hemorrhoid and anal fissure, atrial fibrillation on Eliquis , hypertension, diabetes who is here for follow-up.  She was seen on 03/16/2024 by Camie Cea, PA-C.  At her last visit they discussed constipation, hemorrhoids and anal fissure.  She was treated with Anusol  suppositories and given diltiazem  gel for fissure.  MiraLAX  was recommended with Colace.  They also discussed intermittent upper abdominal pain which started after a fall resulting in a vertebral compression fracture.  She was continued on omeprazole.  Lab work after her last visit showed hemoglobin 12.9, platelet count 242, normal white count, normal amylase and lipase.  She has experienced significant improvement in rectal pain and bleeding since her last visit. Suppositories and a prescribed diltiazem  gel cream have been effective in reducing symptoms, with bleeding mostly subsided and only occasional minor episodes. She continues to use suppositories to help with bleeding and applies the cream as needed.  Her bowel movements have improved with the use of Colace and Miralax . She takes two tablets of Colace at night and Miralax  once a day, resulting in two to three soft, formed bowel movements daily without straining. She experienced initial constipation after her lumbar compression fracture injury and was taking pain medication at that time, which she has since discontinued.  She follows a Mediterranean diet and has lost twenty pounds over the past year. Her appetite, initially poor following her injury, has improved. She has been less socially active since her fall but plans to host a social event soon. She avoids showering when alone due to concerns about falling.  Review of  Systems As per HPI, otherwise negative  Current Medications, Allergies, Past Medical History, Past Surgical History, Family History and Social History were reviewed in [employer] record.    Objective:   Physical Exam Ht 4' 10 (1.473 m)   Wt 149 lb 2 oz (67.6 kg)   LMP 07/01/1980   BMI 31.17 kg/m  Gen: awake, alert, NAD HEENT: anicteric  CV: RRR, no mrg Pulm: CTA b/l Abd: soft, NT/ND, +BS throughout Ext: no c/c/e Neuro: nonfocal   08/2019 colonoscopy by Dr. Albertus: Medium internal and external hemorrhoids, 1 small 3 mm tubular adenoma and 1 small hyperplastic polyp removed.  Good prep.  Biopsies negative for microscopic colitis.  7-year repeat.   08/2022 EGD by Dr. Albertus: 1 cm hiatal hernia.  Nonobstructing Schatzki's ring at the GE junction dilated to 52 FR.  Stomach and duodenum normal.  No biopsies.     Assessment & Plan:   Anal fissure and internal hemorrhoids with bleeding Significant improvement in rectal pain and bleeding with hydrocortisone  suppositories and diltiazem  cream. Bleeding mostly subsided with occasional minor episodes. DOAC contribute to bleeding tendency, but blood counts remain stable and no anemia. - Continue hydrocortisone  suppositories every other night x 1 week, then switch to as needed use.  Avoid daily chronic use. - Discontinue diltiazem  cream as anal fissure pain resolves. - Monitor bleeding and adjust treatment as necessary.  Constipation Constipation improved with Colace and Miralax . Regular bowel movements, typically two to three times a day, with soft and formed stools without straining. Constipation was likely exacerbated by pain medication following a compression fracture, which she has now discontinued. - Continue Colace 2  tablets at night. - Continue Miralax  once daily. - If stools become too loose, reduce Miralax .  MASLD FibroScan score 13; following with Dr. Wyatt at [facility].  On GLP-1 therapy.  No evidence of  decompensated liver disease -Continue GLP-1 therapy and risk factor modification -Follow-up with [facility]ology as planned  GERD Stable continue omeprazole 20 mg twice daily  History of adenomatous colon polyp -Consider surveillance colonoscopy March 2028 based on overall health at that time  30 minutes total spent today including patient facing time, coordination of care, reviewing medical history/procedures/pertinent radiology studies, and documentation of the encounter.

## 2024-04-08 DIAGNOSIS — M533 Sacrococcygeal disorders, not elsewhere classified: Secondary | ICD-10-CM | POA: Diagnosis not present

## 2024-04-11 ENCOUNTER — Other Ambulatory Visit: Payer: Self-pay | Admitting: Internal Medicine

## 2024-05-03 ENCOUNTER — Encounter: Payer: Self-pay | Admitting: Radiology

## 2024-05-03 ENCOUNTER — Other Ambulatory Visit: Payer: Self-pay | Admitting: Cardiovascular Disease

## 2024-05-05 NOTE — Telephone Encounter (Signed)
 Pt of Dr. Francyne. Does Dr. Everitt want to refill this RX? Please advise.

## 2024-05-06 ENCOUNTER — Other Ambulatory Visit: Payer: Self-pay | Admitting: Cardiovascular Disease

## 2024-05-06 DIAGNOSIS — Z6831 Body mass index (BMI) 31.0-31.9, adult: Secondary | ICD-10-CM | POA: Diagnosis not present

## 2024-05-06 DIAGNOSIS — M5416 Radiculopathy, lumbar region: Secondary | ICD-10-CM | POA: Diagnosis not present

## 2024-05-06 DIAGNOSIS — S22070K Wedge compression fracture of T9-T10 vertebra, subsequent encounter for fracture with nonunion: Secondary | ICD-10-CM | POA: Diagnosis not present

## 2024-05-07 ENCOUNTER — Ambulatory Visit: Admitting: Gastroenterology

## 2024-05-07 NOTE — Telephone Encounter (Signed)
 Pt of Dr. Francyne. Does Dr. Francyne want to refill this RX? Please advise.

## 2024-05-07 NOTE — Telephone Encounter (Signed)
 OK to refill but I would ask her first if she really still needs it.

## 2024-05-10 ENCOUNTER — Other Ambulatory Visit: Payer: Self-pay | Admitting: Cardiovascular Disease

## 2024-05-14 MED ORDER — IPRATROPIUM BROMIDE 0.03 % NA SOLN: 2.0000 | Freq: Two times a day (BID) | NASAL | 4 refills | Status: AC

## 2024-05-14 NOTE — Telephone Encounter (Signed)
Refilled under a different encounter.

## 2024-05-14 NOTE — Telephone Encounter (Signed)
 Dr Francyne said to approve. Medication sent

## 2024-05-15 ENCOUNTER — Other Ambulatory Visit: Payer: Self-pay | Admitting: Cardiovascular Disease

## 2024-05-26 DIAGNOSIS — L738 Other specified follicular disorders: Secondary | ICD-10-CM | POA: Diagnosis not present

## 2024-05-26 DIAGNOSIS — K130 Diseases of lips: Secondary | ICD-10-CM | POA: Diagnosis not present

## 2024-06-16 DIAGNOSIS — M5440 Lumbago with sciatica, unspecified side: Secondary | ICD-10-CM | POA: Diagnosis not present

## 2024-06-16 DIAGNOSIS — R2681 Unsteadiness on feet: Secondary | ICD-10-CM | POA: Diagnosis not present

## 2024-06-18 ENCOUNTER — Other Ambulatory Visit: Payer: Self-pay | Admitting: Internal Medicine

## 2024-06-18 ENCOUNTER — Telehealth: Payer: Self-pay | Admitting: Internal Medicine

## 2024-06-18 DIAGNOSIS — R197 Diarrhea, unspecified: Secondary | ICD-10-CM

## 2024-06-18 DIAGNOSIS — A498 Other bacterial infections of unspecified site: Secondary | ICD-10-CM

## 2024-06-18 MED ORDER — VANCOMYCIN HCL 125 MG PO CAPS: 125.0000 mg | ORAL_CAPSULE | Freq: Four times a day (QID) | ORAL | 0 refills | Status: AC

## 2024-06-18 NOTE — Telephone Encounter (Signed)
 I called the patient, got her voicemail and left a message that I was going to empirically start vancomycin  for presumed C. Difficile We do need to collect a stool sample on Monday I told her that Dr. Rollin is on-call for us  this weekend if she needs further help and of course the ER is available but hopefully she will not need  I am calling vancomycin  to a [facility] at the corner of [address] and [address] 125 mg p.o. 4 times daily x 14 days  I am also going to text her son, Dr. Loreli to let him know what I have done.

## 2024-06-18 NOTE — Telephone Encounter (Signed)
 Spoke to patient. Patient states she has been treated for a fungus on/around her lips by dermatology, Dr Joshua. She was given steroids & antibiotics. Patient took her last Prednisone about a month ago and her last antibiotic (unsure of name) 1.5 weeks ago. She started yesterday with uncontrolled loose watery bowels to which she has no warning & soilking herself. The stool has a very foul smell with pus and dark in color. Patient denies any fevers, abdominal pain or nausea. Patient reports a lot of weakness and feeling like she wants to sleep a lot. Patient also reports a palm size swelling to one side of her neck behind her ear which is very tender and sore. Patient instructed if any worsening to go to urgent care to be evaluated.

## 2024-06-18 NOTE — Telephone Encounter (Signed)
 Patient called stating she believes she may have C Diff. States she is having uncontrolled bowel movements starting yesterday and are very dark. States she is feeling very weak. States she finished taking prednisone and antibiotics and starting Wednesday she started to feeling week. States she also has a swollen gland under her ear. Patient is requesting a call back to discuss further. Please advise, thank you.

## 2024-06-18 NOTE — Telephone Encounter (Signed)
 Spoke to patient & advised on Tina's recommendations. Patient states she does not want to go the ER & she scared to even leave her house because of soiling herself. Patient states she is filling her Yeti cup up & drinking lots of water. Patient states she will have her husband come by on Monday to get the stool container. Recheck on Monday after 12:30 PM per request. Encouraged if any worsening she should go to the ER.

## 2024-06-20 ENCOUNTER — Encounter (HOSPITAL_BASED_OUTPATIENT_CLINIC_OR_DEPARTMENT_OTHER): Payer: Self-pay

## 2024-06-20 ENCOUNTER — Inpatient Hospital Stay (HOSPITAL_BASED_OUTPATIENT_CLINIC_OR_DEPARTMENT_OTHER)
Admission: EM | Admit: 2024-06-20 | Discharge: 2024-06-24 | Disposition: A | Discharge status: Home / Self Care | Attending: Internal Medicine | Admitting: Internal Medicine

## 2024-06-20 ENCOUNTER — Other Ambulatory Visit: Payer: Self-pay

## 2024-06-20 ENCOUNTER — Emergency Department (HOSPITAL_BASED_OUTPATIENT_CLINIC_OR_DEPARTMENT_OTHER)

## 2024-06-20 ENCOUNTER — Emergency Department (HOSPITAL_BASED_OUTPATIENT_CLINIC_OR_DEPARTMENT_OTHER): Admitting: Radiology

## 2024-06-20 DIAGNOSIS — Z79899 Other long term (current) drug therapy: Secondary | ICD-10-CM

## 2024-06-20 DIAGNOSIS — A0472 Enterocolitis due to Clostridium difficile, not specified as recurrent: Secondary | ICD-10-CM | POA: Diagnosis present

## 2024-06-20 DIAGNOSIS — M797 Fibromyalgia: Secondary | ICD-10-CM | POA: Diagnosis present

## 2024-06-20 DIAGNOSIS — R531 Weakness: Secondary | ICD-10-CM | POA: Diagnosis present

## 2024-06-20 DIAGNOSIS — E876 Hypokalemia: Secondary | ICD-10-CM | POA: Diagnosis present

## 2024-06-20 DIAGNOSIS — Z91040 Latex allergy status: Secondary | ICD-10-CM

## 2024-06-20 DIAGNOSIS — Z880 Allergy status to penicillin: Secondary | ICD-10-CM

## 2024-06-20 DIAGNOSIS — R197 Diarrhea, unspecified: Secondary | ICD-10-CM

## 2024-06-20 DIAGNOSIS — Z833 Family history of diabetes mellitus: Secondary | ICD-10-CM

## 2024-06-20 DIAGNOSIS — G629 Polyneuropathy, unspecified: Secondary | ICD-10-CM

## 2024-06-20 DIAGNOSIS — Z7901 Long term (current) use of anticoagulants: Secondary | ICD-10-CM

## 2024-06-20 DIAGNOSIS — I5032 Chronic diastolic (congestive) heart failure: Secondary | ICD-10-CM | POA: Diagnosis present

## 2024-06-20 DIAGNOSIS — Z8619 Personal history of other infectious and parasitic diseases: Secondary | ICD-10-CM

## 2024-06-20 DIAGNOSIS — F32A Depression, unspecified: Secondary | ICD-10-CM | POA: Diagnosis present

## 2024-06-20 DIAGNOSIS — K219 Gastro-esophageal reflux disease without esophagitis: Secondary | ICD-10-CM | POA: Diagnosis present

## 2024-06-20 DIAGNOSIS — K111 Hypertrophy of salivary gland: Secondary | ICD-10-CM | POA: Diagnosis present

## 2024-06-20 DIAGNOSIS — Z860101 Personal history of adenomatous and serrated colon polyps: Secondary | ICD-10-CM

## 2024-06-20 DIAGNOSIS — R1310 Dysphagia, unspecified: Secondary | ICD-10-CM | POA: Diagnosis present

## 2024-06-20 DIAGNOSIS — I10 Essential (primary) hypertension: Secondary | ICD-10-CM | POA: Diagnosis present

## 2024-06-20 DIAGNOSIS — D72829 Elevated white blood cell count, unspecified: Secondary | ICD-10-CM

## 2024-06-20 DIAGNOSIS — Z87891 Personal history of nicotine dependence: Secondary | ICD-10-CM

## 2024-06-20 DIAGNOSIS — K7581 Nonalcoholic steatohepatitis (NASH): Secondary | ICD-10-CM | POA: Diagnosis present

## 2024-06-20 DIAGNOSIS — R7989 Other specified abnormal findings of blood chemistry: Secondary | ICD-10-CM

## 2024-06-20 DIAGNOSIS — Z823 Family history of stroke: Secondary | ICD-10-CM

## 2024-06-20 DIAGNOSIS — E1140 Type 2 diabetes mellitus with diabetic neuropathy, unspecified: Secondary | ICD-10-CM | POA: Diagnosis present

## 2024-06-20 DIAGNOSIS — Z90710 Acquired absence of both cervix and uterus: Secondary | ICD-10-CM

## 2024-06-20 DIAGNOSIS — G4733 Obstructive sleep apnea (adult) (pediatric): Secondary | ICD-10-CM | POA: Diagnosis present

## 2024-06-20 DIAGNOSIS — Z96641 Presence of right artificial hip joint: Secondary | ICD-10-CM | POA: Diagnosis present

## 2024-06-20 DIAGNOSIS — M8580 Other specified disorders of bone density and structure, unspecified site: Secondary | ICD-10-CM | POA: Diagnosis present

## 2024-06-20 DIAGNOSIS — A419 Sepsis, unspecified organism: Principal | ICD-10-CM | POA: Diagnosis present

## 2024-06-20 DIAGNOSIS — Z8249 Family history of ischemic heart disease and other diseases of the circulatory system: Secondary | ICD-10-CM

## 2024-06-20 DIAGNOSIS — K1120 Sialoadenitis, unspecified: Principal | ICD-10-CM | POA: Diagnosis present

## 2024-06-20 DIAGNOSIS — Z888 Allergy status to other drugs, medicaments and biological substances status: Secondary | ICD-10-CM

## 2024-06-20 DIAGNOSIS — Z1152 Encounter for screening for COVID-19: Secondary | ICD-10-CM

## 2024-06-20 DIAGNOSIS — Z800 Family history of malignant neoplasm of digestive organs: Secondary | ICD-10-CM

## 2024-06-20 DIAGNOSIS — E7800 Pure hypercholesterolemia, unspecified: Secondary | ICD-10-CM | POA: Diagnosis present

## 2024-06-20 DIAGNOSIS — I110 Hypertensive heart disease with heart failure: Secondary | ICD-10-CM | POA: Diagnosis present

## 2024-06-20 DIAGNOSIS — K1121 Acute sialoadenitis: Secondary | ICD-10-CM | POA: Diagnosis present

## 2024-06-20 DIAGNOSIS — I480 Paroxysmal atrial fibrillation: Secondary | ICD-10-CM | POA: Diagnosis present

## 2024-06-20 DIAGNOSIS — Z886 Allergy status to analgesic agent status: Secondary | ICD-10-CM

## 2024-06-20 LAB — CBC WITH DIFFERENTIAL/PLATELET
Abs Immature Granulocytes: 0.15 K/uL — ABNORMAL HIGH (ref 0.00–0.07)
Basophils Absolute: 0.1 K/uL (ref 0.0–0.1)
Basophils Relative: 0 %
Eosinophils Absolute: 0 K/uL (ref 0.0–0.5)
Eosinophils Relative: 0 %
HCT: 37.2 % (ref 36.0–46.0)
Hemoglobin: 13.1 g/dL (ref 12.0–15.0)
Immature Granulocytes: 1 %
Lymphocytes Relative: 6 %
Lymphs Abs: 1.2 K/uL (ref 0.7–4.0)
MCH: 32.9 pg (ref 26.0–34.0)
MCHC: 35.2 g/dL (ref 30.0–36.0)
MCV: 93.5 fL (ref 80.0–100.0)
Monocytes Absolute: 2.1 K/uL — ABNORMAL HIGH (ref 0.1–1.0)
Monocytes Relative: 11 %
Neutro Abs: 15.7 K/uL — ABNORMAL HIGH (ref 1.7–7.7)
Neutrophils Relative %: 82 %
Platelets: 197 K/uL (ref 150–400)
RBC: 3.98 MIL/uL (ref 3.87–5.11)
RDW: 13.5 % (ref 11.5–15.5)
WBC: 19.3 K/uL — ABNORMAL HIGH (ref 4.0–10.5)
nRBC: 0 % (ref 0.0–0.2)

## 2024-06-20 LAB — COMPREHENSIVE METABOLIC PANEL WITH GFR
ALT: 112 U/L — ABNORMAL HIGH (ref 0–44)
AST: 112 U/L — ABNORMAL HIGH (ref 15–41)
Albumin: 4.2 g/dL (ref 3.5–5.0)
Alkaline Phosphatase: 139 U/L — ABNORMAL HIGH (ref 38–126)
Anion gap: 17 — ABNORMAL HIGH (ref 5–15)
BUN: 5 mg/dL — ABNORMAL LOW (ref 8–23)
CO2: 21 mmol/L — ABNORMAL LOW (ref 22–32)
Calcium: 9.4 mg/dL (ref 8.9–10.3)
Chloride: 93 mmol/L — ABNORMAL LOW (ref 98–111)
Creatinine, Ser: 0.51 mg/dL (ref 0.44–1.00)
GFR, Estimated: >60 mL/min
Glucose, Bld: 145 mg/dL — ABNORMAL HIGH (ref 70–99)
Potassium: 3 mmol/L — ABNORMAL LOW (ref 3.5–5.1)
Sodium: 131 mmol/L — ABNORMAL LOW (ref 135–145)
Total Bilirubin: 1.3 mg/dL — ABNORMAL HIGH (ref 0.0–1.2)
Total Protein: 8.6 g/dL — ABNORMAL HIGH (ref 6.5–8.1)

## 2024-06-20 LAB — URINALYSIS, ROUTINE W REFLEX MICROSCOPIC
Bacteria, UA: NONE SEEN
Bilirubin Urine: NEGATIVE
Glucose, UA: NEGATIVE mg/dL
Ketones, ur: NEGATIVE mg/dL
Nitrite: NEGATIVE
Specific Gravity, Urine: 1.021 (ref 1.005–1.030)
Trans Epithel, UA: 1
pH: 7.5 (ref 5.0–8.0)

## 2024-06-20 LAB — MAGNESIUM: Magnesium: 2.1 mg/dL (ref 1.7–2.4)

## 2024-06-20 LAB — LACTIC ACID, PLASMA
Lactic Acid, Venous: 1.7 mmol/L (ref 0.5–1.9)
Lactic Acid, Venous: 1.4 mmol/L (ref 0.5–1.9)

## 2024-06-20 LAB — RESP PANEL BY RT-PCR (RSV, FLU A&B, COVID)  RVPGX2
Influenza A by PCR: NEGATIVE
Influenza B by PCR: NEGATIVE
Resp Syncytial Virus by PCR: NEGATIVE
SARS Coronavirus 2 by RT PCR: NEGATIVE

## 2024-06-20 LAB — CK: Total CK: 65 U/L (ref 38–234)

## 2024-06-20 MED ORDER — VANCOMYCIN HCL 125 MG PO CAPS
125.0000 mg | ORAL_CAPSULE | Freq: Four times a day (QID) | ORAL | Status: DC
  Administered 2024-06-21 (×2): 125 mg via ORAL
  Filled 2024-06-20 (×5): qty 1

## 2024-06-20 MED ORDER — TOPIRAMATE 25 MG PO TABS: 50.0000 mg | ORAL_TABLET | Freq: Two times a day (BID) | ORAL | Status: DC

## 2024-06-20 MED ORDER — VANCOMYCIN HCL 125 MG PO CAPS: 125.0000 mg | ORAL_CAPSULE | Freq: Four times a day (QID) | ORAL | Status: DC

## 2024-06-20 MED ORDER — MORPHINE SULFATE (PF) 4 MG/ML IV SOLN
4.0000 mg | Freq: Once | INTRAVENOUS | Status: AC
  Administered 2024-06-20: 4 mg via INTRAVENOUS
  Filled 2024-06-20: qty 1

## 2024-06-20 MED ORDER — AMLODIPINE BESYLATE 5 MG PO TABS
2.5000 mg | ORAL_TABLET | Freq: Every day | ORAL | Status: DC
  Administered 2024-06-21 – 2024-06-23 (×3): 2.5 mg via ORAL
  Filled 2024-06-20 (×4): qty 1

## 2024-06-20 MED ORDER — APIXABAN 5 MG PO TABS
5.0000 mg | ORAL_TABLET | Freq: Two times a day (BID) | ORAL | Status: DC
  Administered 2024-06-21 (×2): 5 mg via ORAL
  Filled 2024-06-20 (×2): qty 1
  Filled 2024-06-20: qty 2

## 2024-06-20 MED ORDER — PHENOBARBITAL 32.4 MG PO TABS
145.8000 mg | ORAL_TABLET | Freq: Every day | ORAL | Status: DC
  Administered 2024-06-21 – 2024-06-23 (×2): 145.8 mg via ORAL
  Filled 2024-06-20 (×2): qty 5

## 2024-06-20 MED ORDER — DEXAMETHASONE SOD PHOSPHATE PF 10 MG/ML IJ SOLN
8.0000 mg | Freq: Three times a day (TID) | INTRAMUSCULAR | Status: AC
  Administered 2024-06-20 – 2024-06-21 (×3): 8 mg via INTRAVENOUS

## 2024-06-20 MED ORDER — IOHEXOL 300 MG/ML  SOLN
75.0000 mL | Freq: Once | INTRAMUSCULAR | Status: AC | PRN
  Administered 2024-06-20: 75 mL via INTRAVENOUS

## 2024-06-20 MED ORDER — POTASSIUM CHLORIDE CRYS ER 20 MEQ PO TBCR
40.0000 meq | EXTENDED_RELEASE_TABLET | Freq: Once | ORAL | Status: AC
  Administered 2024-06-20: 40 meq via ORAL
  Filled 2024-06-20: qty 2

## 2024-06-20 MED ORDER — ONDANSETRON HCL 4 MG/2ML IJ SOLN
4.0000 mg | Freq: Once | INTRAMUSCULAR | Status: AC
  Administered 2024-06-20: 4 mg via INTRAVENOUS
  Filled 2024-06-20: qty 2

## 2024-06-20 MED ORDER — LEVOFLOXACIN IN D5W 500 MG/100ML IV SOLN
500.0000 mg | Freq: Once | INTRAVENOUS | Status: AC
  Administered 2024-06-20: 500 mg via INTRAVENOUS
  Filled 2024-06-20: qty 100

## 2024-06-20 MED ORDER — METRONIDAZOLE 500 MG/100ML IV SOLN
500.0000 mg | Freq: Once | INTRAVENOUS | Status: AC
  Administered 2024-06-20: 500 mg via INTRAVENOUS
  Filled 2024-06-20: qty 100

## 2024-06-20 MED ORDER — ACETAMINOPHEN 500 MG PO TABS
500.0000 mg | ORAL_TABLET | Freq: Four times a day (QID) | ORAL | Status: DC | PRN
  Administered 2024-06-21 (×2): 500 mg via ORAL
  Filled 2024-06-20 (×2): qty 1

## 2024-06-20 MED ORDER — LACTATED RINGERS IV BOLUS: 1000.0000 mL | Freq: Once | INTRAVENOUS | Status: DC

## 2024-06-20 MED ORDER — POTASSIUM CHLORIDE 10 MEQ/100ML IV SOLN
10.0000 meq | INTRAVENOUS | Status: AC
  Administered 2024-06-21 (×2): 10 meq via INTRAVENOUS
  Filled 2024-06-20 (×2): qty 100

## 2024-06-20 MED ORDER — DULOXETINE HCL 30 MG PO CPEP
30.0000 mg | ORAL_CAPSULE | Freq: Every evening | ORAL | Status: DC
  Administered 2024-06-21: 30 mg via ORAL
  Filled 2024-06-20: qty 1

## 2024-06-20 MED ORDER — GABAPENTIN 400 MG PO CAPS
800.0000 mg | ORAL_CAPSULE | Freq: Two times a day (BID) | ORAL | Status: DC
  Administered 2024-06-21: 800 mg via ORAL
  Filled 2024-06-20 (×2): qty 2

## 2024-06-20 MED ORDER — LACTATED RINGERS IV BOLUS
500.0000 mL | Freq: Once | INTRAVENOUS | Status: AC
  Administered 2024-06-20: 500 mL via INTRAVENOUS

## 2024-06-20 NOTE — ED Notes (Signed)
 Called lab to add on CK, Mag and urine culture

## 2024-06-20 NOTE — Subjective & Objective (Addendum)
 Presents with right-sided facial swelling and possible abscess for the past 4 days fatigue chills and nausea, having diarrhea stools are very dark Has been fatigued Recently been antibiotics doxycycline and prednisone No associated fevers abdominal pain or nausea Patient has been very fatigued She called into her gastroenterology who told her to go to emergency department but did call in vancomycin  125 mg p.o. 4 times a day for 14 days  Patient has complex medical history consistent with dermatomyositis diabetes interstitial lung disease history of C. difficile in the past as well as colonization with Aspergillus Prior history of parotitis  Regarding facial swelling has been ongoing for the past 4 days on the right she has been endorsing subjective fevers she tried to suck some lemon drops Not able to eat very well because of this No dental pain has hard time biting down Started to have some cough and sore throat as well  CT neck showed - enlarged right parotid gland with associated stranding in the right parapharyngeal fat and along the right side of the face and neck, consistent with infection, acute parotitis  Enlarged reactive right submandibular and level 2b lymph nodes.   Incidental 1.8 cm right thyroid  nodule, with recommendation for non-emergent thyroid  ultrasound.  Has not had a BM in ER so were unable to test for c.dif Diarrhea now improved

## 2024-06-20 NOTE — ED Provider Notes (Signed)
 " [place] EMERGENCY DEPARTMENT AT [facility] Provider Note   CSN: [id number] Arrival date & time: 06/20/24  1449     Patient presents with: Abscess   Amber Bennett is a 73 y.o. female.   HPI     73yo female with history of atrial fibrillation on Eliquis , diastolic CHF, hypertension, hypercholeeterolemia, OSA, dermatomyositis, type II DM, interstitial lung disease, and previous C. 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   About 4 days ago began to have right facial swelling, subjective fevers, generalized weakness with shakiness arms legs, needs help getting up Feels swelling side of neck Diarrhea 4 days ago, Dr. Albertus called in oral vancomycin  as she has history of that Cleared up on Saturday Friday night began to have facial swelling, sucked on lemon drops, had sialoadenitis in the past and had antibiotics, could have been parotitis -was 1.5 years ago Not eating, drinking liquids not able to eat much  Yesterday able to have a little soup No dental pain Pain with chewing, can't bite down Has had sore throat, phlegm starting yesterday and today  Cough started yesterday and today, no runny nose No dyspnea or chest pain No abdominal pain Nausea, no vomiting Had a little diarrhea again this morning Was on doxycycline for dermatological problem, stopped last Monday (10 days) No urinary symptoms       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   Carpal tunnel syndrome    Cervical radiculitis    Chronic diarrhea 10/24/2023   Collar bone fracture     Depression    Dermatomyositis (HCC)    Diabetes mellitus without complication (HCC)    Dysrhythmia    Elevated liver enzymes    External hemorrhoids    Fatty liver    Fibromyalgia    Fibromyalgia affecting hand 08/2022   bilateral wrist and hands   Hematuria    negative evaluation   High cholesterol    HTN (hypertension)    Internal hemorrhoids    Laryngitis    Lower back pain    Macular degeneration    Nonalcoholic steatohepatitis (NASH)    Nonalcoholic steatohepatitis (NASH)    Osteopenia    Left side hip   Seizures (HCC)    09/07/2019 pt reports last seizure in 2014   Tubular adenoma of colon      Prior to Admission medications  Medication Sig Start Date End Date Taking? Authorizing Provider  acetaminophen  (TYLENOL ) 500 MG tablet Take 500 mg by mouth every 6 (six) hours as needed for moderate pain (pain score 4-6) or mild pain (pain score 1-3).    [provider]  Alirocumab  (PRALUENT ) 150 MG/ML SOAJ Inject 1 mL (150 mg total) into the skin every 14 (fourteen) days. 07/16/23   Shayne Anes, MD  AMBULATORY NON FORMULARY MEDICATION Medication name: Diltiazem  2% with 5% lidocaine  Using your index finger, apply a small amount of medication inside the rectum up to your first knuckle/joint 2-3 times daily for 6-8 weeks. 03/16/24   Heinz, Camie BRAVO, PA-C  amLODipine  (NORVASC ) 2.5 MG tablet Take 1 tablet (2.5 mg total) by mouth at bedtime.  12/02/22   Shayne Anes, MD  apixaban  (ELIQUIS ) 5 MG TABS tablet TAKE 1 TABLET(5 MG) BY MOUTH TWICE DAILY 03/15/24   Croitoru, Mihai, MD  beclomethasone (QVAR  REDIHALER) 80 MCG/ACT inhaler Inhale 1 puff into the lungs 2 (two) times daily. 07/31/23   Kozlow, Eric J, MD  Calcium Carb-Cholecalciferol  (CALTRATE 600+D3 PO) Take 1 tablet by mouth daily.    [provider]  Cholecalciferol  (VITAMIN D ) 50 MCG (2000 UT) tablet Take 2,000 Units by mouth daily.    [provider]  ciprofloxacin  (CIPRO ) 500 MG tablet 1 tablet Orally every 12  hrs    [provider]  clotrimazole (LOTRIMIN) 1 % cream Apply 1 Application topically 2 (two) times daily.    [provider]  Coenzyme Q10 (CO Q 10 PO) Take 200 mg by mouth daily.    [provider]  cyclobenzaprine  (FLEXERIL ) 5 MG tablet Take 1 tablet (5 mg total) by mouth every 8 (eight) hours as needed. 07/07/23   Avva, Ravisankar, MD  desonide (DESOWEN) 0.05 % cream Apply topically 2 (two) times daily. 03/18/24   [provider]  diphenoxylate -atropine  (LOMOTIL ) 2.5-0.025 MG tablet Take 1-2 tablets by mouth every 4 hours as needed for diarrhea/loose stools 09/08/23   Pyrtle, Gordy HERO, MD  DULoxetine  (CYMBALTA ) 30 MG capsule Take 1 capsule (30 mg total) by mouth every evening. 06/02/23   Shayne Anes, MD  ezetimibe  (ZETIA ) 10 MG tablet Take 10 mg by mouth every evening.    [provider]  fluticasone  (FLONASE ) 50 MCG/ACT nasal spray Place 2 sprays into both nostrils daily.    [provider]  furosemide  (LASIX ) 40 MG tablet Take 2 tablets (80 mg total) by mouth daily. 05/18/24   Croitoru, Mihai, MD  gabapentin  (NEURONTIN ) 800 MG tablet Take 1 tablet (800 mg total) by mouth 2 (two) times daily. 10/28/22   Shayne Anes, MD  glucose blood (ONETOUCH ULTRA) test strip Use to check blood sugar once a day In Vitro; Duration: 90 days 08/03/21   [provider]  hydrocortisone  (ANUSOL -HC) 25 MG suppository Use one every other night x 1-2 weeks 04/07/24   Pyrtle, Gordy HERO, MD  ibandronate (BONIVA) 150 MG tablet Take 150 mg by mouth every 30 (thirty) days. 01/25/23   [provider]  ibandronate (BONIVA) 150 MG tablet Take 150 mg by mouth every 30 (thirty) days. Take in the morning with a full glass of water, on an empty stomach, and do not take anything else by mouth or lie down for the next 30 min.    [provider]  ipratropium (ATROVENT ) 0.03 % nasal spray Place 2 sprays into both nostrils every 12 (twelve) hours. 05/14/24   Croitoru,  Mihai, MD  ipratropium-albuterol  (DUONEB) 0.5-2.5 (3) MG/3ML SOLN Take 3 mLs by nebulization 3 (three) times daily as needed. 08/05/23   Kara Dorn NOVAK, MD  loratadine  (CLARITIN ) 10 MG tablet Take 1 tablet (10 mg total) by mouth daily as needed for allergies (Can take an extra dose during flare ups.). 08/26/23   Kozlow, Camellia PARAS, MD  methocarbamol  (ROBAXIN ) 500 MG tablet Take 1.5 tablets by mouth 2 (two) times daily. 03/03/24   [provider]  metoprolol  tartrate (LOPRESSOR ) 50 MG tablet Take 1 tablet (50 mg total) by mouth 2 (two) times daily. 02/07/23   Croitoru, Mihai, MD  metroNIDAZOLE  (FLAGYL ) 250 MG tablet Take 250 mg by mouth 3 (three) times daily.    [provider]  montelukast  (SINGULAIR ) 10 MG tablet Take  10 mg by mouth as needed.    [provider]  Multiple Vitamins-Minerals (MULTIVITAMIN PO) Take 1 tablet by mouth daily.    [provider]  nystatin cream (MYCOSTATIN) Apply 1 Application topically 2 (two) times daily. 03/18/24   [provider]  Olopatadine-Mometasone  (RYALTRIS ) [phone] MCG/ACT SUSP Place 2 sprays into the nose 2 (two) times daily. 08/26/23   Kozlow, Camellia PARAS, MD  omeprazole (PRILOSEC) 20 MG capsule Take 20 mg by mouth 2 (two) times daily before a meal. 08/19/22 04/07/24  [provider]  PHENobarbital  (LUMINAL) 97.2 MG tablet Take 1.5 tablets (145.8 mg total) by mouth daily. 12/31/23   Millikan, Megan, NP  potassium chloride  SA (KLOR-CON  M) 20 MEQ tablet While taking 40 mg of Furosemide , take Potassium Chloride  20 meq twice a day. When you decrease Furosemide  to 20 mg, decrease Potassium Chloride  to 20 meq once a day. 03/17/23   Croitoru, Mihai, MD  PRALUENT  150 MG/ML SOAJ Inject 150 mg into the skin every 14 (fourteen) days. 10/03/21   [provider]  pyridoxine  (B-6) 100 MG tablet Take 100 mg by mouth daily.    [provider]  sacubitril -valsartan  (ENTRESTO ) 49-51 MG Take 1 tablet by mouth 2 (two) times daily.     [provider]  sodium chloride  (OCEAN) 0.65 % nasal spray Place 1 spray into the nose as needed.    [provider]  topiramate  (TOPAMAX ) 50 MG tablet TAKE 1 TABLET BY MOUTH IN THE MORNIGN& 2 TABS EVERY EVENING 02/24/24   Millikan, Megan, NP  vancomycin  (VANCOCIN ) 125 MG capsule Take 1 capsule (125 mg total) by mouth 4 (four) times daily for 14 days. 06/18/24 07/02/24  Pyrtle, Gordy HERO, MD  vitamin E 400 UNIT capsule Take 400 Units by mouth daily.    [provider]  ZEPBOUND  2.5 MG/0.5ML Pen Inject 2.5 mg into the skin once a week. 7.5 mg she is taking    [provider]    Allergies: Aspirin , Cefdinir, Colesevelam, Ezetimibe -simvastatin, Penicillin g benzathine, Rosuvastatin, Wound dressing adhesive, and Latex    Review of Systems  Updated Vital Signs BP 135/61   Pulse 95   Temp 99.1 F (37.3 C)   Resp 20   Ht 4' 10 (1.473 m)   Wt 64.4 kg   LMP 07/01/1980   SpO2 98%   BMI 29.68 kg/m   Physical Exam  (all labs ordered are listed, but only abnormal results are displayed) Labs Reviewed  CBC WITH DIFFERENTIAL/PLATELET - Abnormal; Notable for the following components:      Result Value   WBC 19.3 (*)    Neutro Abs 15.7 (*)    Monocytes Absolute 2.1 (*)    Abs Immature Granulocytes 0.15 (*)    All other components within normal limits  COMPREHENSIVE METABOLIC PANEL WITH GFR - Abnormal; Notable for the following components:   Sodium 131 (*)    Potassium 3.0 (*)    Chloride 93 (*)    CO2 21 (*)    Glucose, Bld 145 (*)    BUN 5 (*)    Total Protein 8.6 (*)    AST 112 (*)    ALT 112 (*)    Alkaline Phosphatase 139 (*)    Total Bilirubin 1.3 (*)    Anion gap 17 (*)    All other components within normal limits  URINALYSIS, ROUTINE W REFLEX MICROSCOPIC - Abnormal; Notable for the following components:   Hgb urine dipstick LARGE (*)    Protein,  ur TRACE (*)    Leukocytes,Ua MODERATE (*)    All other components within normal limits  RESP  PANEL BY RT-PCR (RSV, FLU A&B, COVID)  RVPGX2  CULTURE, BLOOD (ROUTINE X 2)  CULTURE, BLOOD (ROUTINE X 2)  URINE CULTURE  LACTIC ACID, PLASMA  LACTIC ACID, PLASMA  CK  MAGNESIUM    EKG: EKG Interpretation Date/Time:  Sunday June 20 2024 15:24:25 EST Ventricular Rate:  90 PR Interval:  247 QRS Duration:  84 QT Interval:  358 QTC Calculation: 438 R Axis:   73  Text Interpretation: Sinus rhythm Prolonged PR interval Since prior, heart rate has increased Confirmed by Dreama Longs ([id number]) on 06/20/2024 3:36:58 PM  Radiology: CT Soft Tissue Neck W Contrast Result Date: 06/20/2024 EXAM: CT NECK WITH CONTRAST 06/20/2024 04:29:30 PM TECHNIQUE: CT of the neck was performed with the administration of 75 mL of iohexol  (OMNIPAQUE ) 300 MG/ML solution. Multiplanar reformatted images are provided for review. Automated exposure control, iterative reconstruction, and/or weight based adjustment of the mA/kV was utilized to reduce the radiation dose to as low as reasonably achievable. COMPARISON: None available. CLINICAL HISTORY: Soft tissue infection suspected, neck, xray done. Right-sided facial swelling 4 days. Question abscess. FINDINGS: AERODIGESTIVE TRACT: Ruthie is present in the right parapharyngeal fat with some mass effect on the right side of the oropharynx. SALIVARY GLANDS: Extensive edematous changes are present within an enlarged right parotid gland. No discrete mass lesion or ducts obstruction is present. The submandibular glands are unremarkable. THYROID : A 1.8 cm incidental right thyroid  nodule is present. LYMPH NODES: Enlarged reactive right submandibular and level 2b lymph nodes are present. SOFT TISSUES: Stranding extends along the right side of the face and neck with slight thickening of the platysma. BONES: Straightening of the normal cervical lordosis is present with uncovertebral and facet hypertrophy contributed to multilevel foraminal stenosis. OTHER: Visualized sinuses and  mastoid air cells are well aerated. Visualized lungs are clear. IMPRESSION: 1. Extensive edematous changes in the enlarged right parotid gland with associated stranding in the right parapharyngeal fat and along the right side of the face and neck, consistent with infection, acute parotitis . No discrete abscess identified. 2. Enlarged reactive right submandibular and level 2b lymph nodes. 3. Incidental 1.8 cm right thyroid  nodule, with recommendation for non-emergent thyroid  ultrasound. Electronically signed by: Lonni Necessary MD 06/20/2024 04:40 PM EST RP Workstation: [id number]   DG Chest 2 View Result Date: 06/20/2024 EXAM: 2 VIEW(S) XRAY OF THE CHEST 06/20/2024 04:10:00 PM COMPARISON: 09/19/2023 CLINICAL HISTORY: cough FINDINGS: LUNGS AND PLEURA: No focal pulmonary opacity. No pleural effusion. No pneumothorax. HEART AND MEDIASTINUM: Aortic atherosclerosis. Cardiomegaly. BONES AND SOFT TISSUES: Old left rib fractures. Age indeterminate, mild compression fracture of a mid thoracic vertebral body favored to be T9. Osteopenia. Incompletely evaluated, partially visualized lumbar fusion hardware. IMPRESSION: 1. No acute cardiopulmonary abnormality. 2. Age indeterminate, mild compression fracture of a mid thoracic vertebral body, favored to be T9. Correlation with point tenderness recommended to assess for acuity. Electronically signed by: Rogelia Myers MD 06/20/2024 04:32 PM EST RP Workstation: [id number]     Procedures   Medications Ordered in the ED  dexamethasone  (DECADRON ) injection 8 mg (has no administration in time range)  lactated ringers  bolus 500 mL (0 mLs Intravenous Stopped 06/20/24 1939)  iohexol  (OMNIPAQUE ) 300 MG/ML solution 75 mL (75 mLs Intravenous Contrast Given 06/20/24 1618)  levofloxacin  (LEVAQUIN ) IVPB 500 mg (0 mg Intravenous Stopped 06/20/24 1816)  metroNIDAZOLE  (FLAGYL ) IVPB 500 mg (0 mg Intravenous Stopped 06/20/24 1932)  morphine  (PF) 4 MG/ML injection 4 mg (4 mg  Intravenous Given 06/20/24 1812)  ondansetron  (ZOFRAN ) injection 4 mg (4 mg Intravenous Given 06/20/24 1811)  potassium chloride  SA (KLOR-CON  M) CR tablet 40 mEq (40 mEq Oral Given 06/20/24 1940)                                     73yo female with history of atrial fibrillation on Eliquis , diastolic CHF, hypertension, hypercholeeterolemia, OSA, dermatomyositis, type II DM, interstitial lung disease, and previous C. 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    DDx includes viral or bacterial parotitis, parotid gland abscess, other submandibular abscess, odontogenic infection of face, RPA, PTA, as well as pneumonia, influenza, urinary tract infection, electrolyte abnormality, with ddx also including c diff as etiology of diarrhea, other viral etiologies.   Labs completed and personally by interpreted by me show white blood cell count of 19,000, mild hyponatremia, hypokalemia, mildly decreased bicarb and increased anion gap, lactic acid within normal limits.  Her transaminases are elevated, noted to have slight elevation also in 2023 but was normal in March.  She is not having specific right upper quadrant tenderness, does have concern for NASH, can continue to monitor this finding.   Chest x-ray was completed and personally evaluated by me and radiology and showed an age indeterminant compression fracture, son reports that this had been found after a previous fall.  No signs of pneumonia   CT soft tissue neck showed extensive edematous change in the enlarged right parotid gland with associated stranding in the right parapharyngeal fat along the right side of the face and neck consistent with infection, acute parotitis without  discrete abscess identified, enlarged reactive lymph nodes, and an incidentally noted thyroid  nodule for which I discussed with patient and recommend nonemergent ultrasound.  Will admit to the hospital for IV antibiotics with concern for bacterial parotitis.  Given her allergies, ordered Levaquin  and Flagyl .  Spoke with [facility]: Decadron  8mg  q 8 hours for 3 doses, lemon slices/sialogogues, hot/cold compresses alternating.  If concern for development of fluid collection, lack of response can reach out to ENT.  Will admit for continued care.       Final diagnoses:  Parotitis  Leukocytosis, unspecified type    ED Discharge Orders     None          Dreama Longs, MD 06/20/24 1942  "

## 2024-06-20 NOTE — ED Triage Notes (Signed)
 Pt reports R sided facial swelling and possible abscess x4 days. Pt reports fatigue, chills and nausea.

## 2024-06-20 NOTE — Plan of Care (Addendum)
 Patient is a transfer from [facility]  73 year old female with history of A-fib on Eliquis  diastolic CHF HTN HLD dermatomyositis DM2 prior history of parotitis and C. Difficile Who presented with right facial swelling and foul-smelling diarrhea Was treated with vancomycin  for presumed C. difficile and diarrhea not improved CT showed evidence of parotitis ER provider will consult ENT for now patient started on Levaquin  Flagyl  Vital signs stable no evidence of airway compromise Given elevated LFTs and evidence of dehydration requested ER obtain CK And rehydrate patient ER provider states no right upper quadrant tenderness Patient admitted to telemetry floor Please call flow manager at the time of arrival 7:33 PM Amber Bennett  For further details see below Presents with right-sided facial swelling and possible abscess for the past 4 days fatigue chills and nausea, having diarrhea stools are very dark Has been fatigued Recently been antibiotics doxycycline and prednisone No associated fevers abdominal pain or nausea Patient has been very fatigued She called into her gastroenterology who told her to go to emergency department but did call in vancomycin  125 mg p.o. 4 times a day for 14 days  Patient has complex medical history consistent with dermatomyositis diabetes interstitial lung disease history of C. difficile in the past as well as colonization with Aspergillus Prior history of parotitis  Regarding facial swelling has been ongoing for the past 4 days on the right she has been endorsing subjective fevers she tried to suck some lemon drops Not able to eat very well because of this No dental pain has hard time biting down Started to have some cough and sore throat as well  CT neck showed - enlarged right parotid gland with associated stranding in the right parapharyngeal fat and along the right side of the face and neck, consistent with infection, acute  parotitis  Enlarged reactive right submandibular and level 2b lymph nodes.   Incidental 1.8 cm right thyroid  nodule, with recommendation for non-emergent thyroid  ultrasound.  Has not had a BM in ER so were unable to test for c.dif Diarrhea now improved

## 2024-06-21 ENCOUNTER — Inpatient Hospital Stay (HOSPITAL_COMMUNITY)

## 2024-06-21 DIAGNOSIS — F32A Depression, unspecified: Secondary | ICD-10-CM | POA: Diagnosis present

## 2024-06-21 DIAGNOSIS — R1310 Dysphagia, unspecified: Secondary | ICD-10-CM | POA: Diagnosis present

## 2024-06-21 DIAGNOSIS — K219 Gastro-esophageal reflux disease without esophagitis: Secondary | ICD-10-CM | POA: Diagnosis present

## 2024-06-21 DIAGNOSIS — Z87891 Personal history of nicotine dependence: Secondary | ICD-10-CM | POA: Diagnosis not present

## 2024-06-21 DIAGNOSIS — Z8249 Family history of ischemic heart disease and other diseases of the circulatory system: Secondary | ICD-10-CM | POA: Diagnosis not present

## 2024-06-21 DIAGNOSIS — E1140 Type 2 diabetes mellitus with diabetic neuropathy, unspecified: Secondary | ICD-10-CM | POA: Diagnosis present

## 2024-06-21 DIAGNOSIS — G4733 Obstructive sleep apnea (adult) (pediatric): Secondary | ICD-10-CM | POA: Diagnosis present

## 2024-06-21 DIAGNOSIS — I5032 Chronic diastolic (congestive) heart failure: Secondary | ICD-10-CM | POA: Diagnosis present

## 2024-06-21 DIAGNOSIS — Z96641 Presence of right artificial hip joint: Secondary | ICD-10-CM | POA: Diagnosis present

## 2024-06-21 DIAGNOSIS — K1120 Sialoadenitis, unspecified: Secondary | ICD-10-CM

## 2024-06-21 DIAGNOSIS — K7581 Nonalcoholic steatohepatitis (NASH): Secondary | ICD-10-CM | POA: Diagnosis present

## 2024-06-21 DIAGNOSIS — K1121 Acute sialoadenitis: Secondary | ICD-10-CM | POA: Diagnosis present

## 2024-06-21 DIAGNOSIS — R197 Diarrhea, unspecified: Secondary | ICD-10-CM | POA: Diagnosis not present

## 2024-06-21 DIAGNOSIS — M8580 Other specified disorders of bone density and structure, unspecified site: Secondary | ICD-10-CM | POA: Diagnosis present

## 2024-06-21 DIAGNOSIS — I110 Hypertensive heart disease with heart failure: Secondary | ICD-10-CM | POA: Diagnosis present

## 2024-06-21 DIAGNOSIS — E876 Hypokalemia: Secondary | ICD-10-CM | POA: Diagnosis present

## 2024-06-21 DIAGNOSIS — K111 Hypertrophy of salivary gland: Secondary | ICD-10-CM | POA: Diagnosis present

## 2024-06-21 DIAGNOSIS — M797 Fibromyalgia: Secondary | ICD-10-CM | POA: Diagnosis present

## 2024-06-21 DIAGNOSIS — R7989 Other specified abnormal findings of blood chemistry: Secondary | ICD-10-CM | POA: Diagnosis not present

## 2024-06-21 DIAGNOSIS — Z1152 Encounter for screening for COVID-19: Secondary | ICD-10-CM | POA: Diagnosis not present

## 2024-06-21 DIAGNOSIS — Z7901 Long term (current) use of anticoagulants: Secondary | ICD-10-CM | POA: Diagnosis not present

## 2024-06-21 DIAGNOSIS — I480 Paroxysmal atrial fibrillation: Secondary | ICD-10-CM | POA: Diagnosis present

## 2024-06-21 DIAGNOSIS — Z833 Family history of diabetes mellitus: Secondary | ICD-10-CM | POA: Diagnosis not present

## 2024-06-21 DIAGNOSIS — A0472 Enterocolitis due to Clostridium difficile, not specified as recurrent: Secondary | ICD-10-CM | POA: Diagnosis present

## 2024-06-21 DIAGNOSIS — A419 Sepsis, unspecified organism: Secondary | ICD-10-CM | POA: Diagnosis present

## 2024-06-21 DIAGNOSIS — E7800 Pure hypercholesterolemia, unspecified: Secondary | ICD-10-CM | POA: Diagnosis present

## 2024-06-21 LAB — ACETAMINOPHEN LEVEL: Acetaminophen (Tylenol), Serum: <10 ug/mL — ABNORMAL LOW (ref 10–30)

## 2024-06-21 LAB — HEMOGLOBIN A1C
Hgb A1c MFr Bld: 4.8 % (ref 4.8–5.6)
Mean Plasma Glucose: 91.06 mg/dL

## 2024-06-21 LAB — C DIFFICILE QUICK SCREEN W PCR REFLEX
C Diff antigen: NEGATIVE
C Diff interpretation: NOT DETECTED
C Diff toxin: NEGATIVE

## 2024-06-21 LAB — GLUCOSE, CAPILLARY
Glucose-Capillary: 106 mg/dL — ABNORMAL HIGH (ref 70–99)
Glucose-Capillary: 127 mg/dL — ABNORMAL HIGH (ref 70–99)
Glucose-Capillary: 130 mg/dL — ABNORMAL HIGH (ref 70–99)
Glucose-Capillary: 162 mg/dL — ABNORMAL HIGH (ref 70–99)

## 2024-06-21 LAB — HEPATITIS PANEL, ACUTE
HCV Ab: NONREACTIVE
Hep A IgM: NONREACTIVE
Hep B C IgM: NONREACTIVE
Hepatitis B Surface Ag: NONREACTIVE

## 2024-06-21 MED ORDER — LEVOFLOXACIN IN D5W 500 MG/100ML IV SOLN: 500.0000 mg | INTRAVENOUS | Status: DC

## 2024-06-21 MED ORDER — BUDESONIDE 0.25 MG/2ML IN SUSP
0.2500 mg | Freq: Two times a day (BID) | RESPIRATORY_TRACT | Status: DC
  Administered 2024-06-21 – 2024-06-23 (×6): 0.25 mg via RESPIRATORY_TRACT
  Filled 2024-06-21 (×7): qty 2

## 2024-06-21 MED ORDER — SODIUM CHLORIDE 0.9 % IV SOLN
3.0000 g | Freq: Four times a day (QID) | INTRAVENOUS | Status: DC
  Administered 2024-06-21 – 2024-06-24 (×11): 3 g via INTRAVENOUS
  Filled 2024-06-21 (×14): qty 8

## 2024-06-21 MED ORDER — METHOCARBAMOL 500 MG PO TABS
750.0000 mg | ORAL_TABLET | Freq: Four times a day (QID) | ORAL | Status: DC | PRN
  Administered 2024-06-21: 750 mg via ORAL
  Filled 2024-06-21 (×2): qty 2

## 2024-06-21 MED ORDER — TRAZODONE HCL 50 MG PO TABS
25.0000 mg | ORAL_TABLET | Freq: Every evening | ORAL | Status: DC | PRN
  Filled 2024-06-21: qty 1

## 2024-06-21 MED ORDER — METOPROLOL TARTRATE 50 MG PO TABS
50.0000 mg | ORAL_TABLET | Freq: Two times a day (BID) | ORAL | Status: DC
  Administered 2024-06-21: 50 mg via ORAL
  Filled 2024-06-21 (×2): qty 1

## 2024-06-21 MED ORDER — ACETAMINOPHEN 325 MG PO TABS: 650.0000 mg | ORAL_TABLET | Freq: Four times a day (QID) | ORAL | Status: DC | PRN

## 2024-06-21 MED ORDER — FUROSEMIDE 40 MG PO TABS
80.0000 mg | ORAL_TABLET | Freq: Every day | ORAL | Status: DC
  Administered 2024-06-21: 80 mg via ORAL
  Filled 2024-06-21: qty 2

## 2024-06-21 MED ORDER — INSULIN ASPART 100 UNIT/ML IJ SOLN: 0.0000 [IU] | Freq: Every day | INTRAMUSCULAR | Status: DC

## 2024-06-21 MED ORDER — OXYCODONE HCL 5 MG PO TABS
5.0000 mg | ORAL_TABLET | ORAL | Status: DC | PRN
  Administered 2024-06-21: 5 mg via ORAL
  Filled 2024-06-21: qty 1

## 2024-06-21 MED ORDER — PANTOPRAZOLE SODIUM 40 MG PO TBEC
40.0000 mg | DELAYED_RELEASE_TABLET | Freq: Every day | ORAL | Status: DC
  Administered 2024-06-21: 40 mg via ORAL
  Filled 2024-06-21: qty 1

## 2024-06-21 MED ORDER — MORPHINE SULFATE (PF) 4 MG/ML IV SOLN
4.0000 mg | Freq: Once | INTRAVENOUS | Status: AC
  Administered 2024-06-21: 4 mg via INTRAVENOUS
  Filled 2024-06-21: qty 1

## 2024-06-21 MED ORDER — HYDROXYZINE HCL 25 MG PO TABS
25.0000 mg | ORAL_TABLET | ORAL | Status: DC | PRN
  Administered 2024-06-21: 25 mg via ORAL
  Filled 2024-06-21 (×2): qty 1

## 2024-06-21 MED ORDER — MONTELUKAST SODIUM 10 MG PO TABS
10.0000 mg | ORAL_TABLET | Freq: Every day | ORAL | Status: DC
  Filled 2024-06-21: qty 1

## 2024-06-21 MED ORDER — SACUBITRIL-VALSARTAN 49-51 MG PO TABS
1.0000 | ORAL_TABLET | Freq: Two times a day (BID) | ORAL | Status: DC
  Administered 2024-06-21: 1 via ORAL
  Filled 2024-06-21 (×3): qty 1

## 2024-06-21 MED ORDER — ONDANSETRON HCL 4 MG PO TABS: 4.0000 mg | ORAL_TABLET | Freq: Four times a day (QID) | ORAL | Status: DC | PRN

## 2024-06-21 MED ORDER — TOPIRAMATE 100 MG PO TABS
100.0000 mg | ORAL_TABLET | Freq: Every day | ORAL | Status: DC
  Filled 2024-06-21: qty 1

## 2024-06-21 MED ORDER — METRONIDAZOLE 500 MG/100ML IV SOLN: 500.0000 mg | Freq: Two times a day (BID) | INTRAVENOUS | Status: DC

## 2024-06-21 MED ORDER — IPRATROPIUM-ALBUTEROL 0.5-2.5 (3) MG/3ML IN SOLN: 3.0000 mL | Freq: Three times a day (TID) | RESPIRATORY_TRACT | Status: DC | PRN

## 2024-06-21 MED ORDER — ONDANSETRON HCL 4 MG/2ML IJ SOLN: 4.0000 mg | Freq: Four times a day (QID) | INTRAMUSCULAR | Status: DC | PRN

## 2024-06-21 MED ORDER — TOPIRAMATE 25 MG PO TABS
50.0000 mg | ORAL_TABLET | Freq: Every day | ORAL | Status: DC
  Administered 2024-06-21: 50 mg via ORAL
  Filled 2024-06-21: qty 2

## 2024-06-21 MED ORDER — ACETAMINOPHEN 650 MG RE SUPP
650.0000 mg | Freq: Four times a day (QID) | RECTAL | Status: DC | PRN
  Administered 2024-06-22: 650 mg via RECTAL
  Filled 2024-06-21: qty 1

## 2024-06-21 MED ORDER — HYDROMORPHONE HCL 1 MG/ML IJ SOLN
1.0000 mg | INTRAMUSCULAR | Status: DC | PRN
  Administered 2024-06-21 (×3): 1 mg via INTRAVENOUS
  Filled 2024-06-21 (×3): qty 1

## 2024-06-21 MED ORDER — EZETIMIBE 10 MG PO TABS
10.0000 mg | ORAL_TABLET | Freq: Every evening | ORAL | Status: DC
  Administered 2024-06-21: 10 mg via ORAL
  Filled 2024-06-21: qty 1

## 2024-06-21 MED ORDER — INSULIN ASPART 100 UNIT/ML IJ SOLN
0.0000 [IU] | Freq: Three times a day (TID) | INTRAMUSCULAR | Status: DC
  Administered 2024-06-21: 2 [IU] via SUBCUTANEOUS
  Administered 2024-06-21: 3 [IU] via SUBCUTANEOUS
  Filled 2024-06-21: qty 3
  Filled 2024-06-21: qty 2

## 2024-06-21 NOTE — H&P (Addendum)
 " History and Physical  [name]:[id number] DOB: 1950/12/17 DOA: 06/20/2024  PCP: Shayne Anes, MD   Chief Complaint: Right-sided facial swelling and pain  HPI: Amber Bennett is a 73 y.o. female with medical history significant for chronic heart failure with preserved EF, atrial fibrillation on Eliquis , hypertension, hypercholesterolemia, history of C. 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  History is provided primarily by the patient, as well as her son who is at the bedside.  They state that she was on a course of oral doxycycline for about 10 days, at the conclusion of which (approximately 6 days ago), she started having copious watery diarrhea with some mild abdominal cramping, but no stool incontinence.  She did not have any fevers or chills at home, but she was feeling quite weak, and somnolent from time to time.  Has not been sleeping well.  Has been having some mild nausea, and lack of appetite.  Starting about 5 days ago, she also noticed some right sided facial swelling, started sucking on lemon candies etc. thinking that she may have recurrent sialoadenitis.  In the meantime, on 12/19 she called her GI doctor who called in a prescription for oral vancomycin  that she has been taking for the last couple of days, diarrhea has slowed down and is only happening now a couple of times a day.  In the last 48 hours, she has had significant worsening of her right sided facial swelling and significant pain.  She went to the ER for evaluation, where CT of the neck showed enlarged right parotid gland with associated fat stranding without discrete fluid collection.  She was started on empiric IV antibiotics, IV Decadron , and admitted to the hospitalist service at [facility].  Review of Systems: Please see HPI for pertinent  positives and negatives. A complete 10 system review of systems are otherwise negative.  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   Carpal tunnel syndrome    Cervical radiculitis    Chronic diarrhea 10/24/2023   Collar bone fracture    Depression    Dermatomyositis (HCC)    Diabetes mellitus without complication (HCC)    Dysrhythmia    Elevated liver enzymes    External hemorrhoids    Fatty liver    Fibromyalgia    Fibromyalgia affecting hand 08/2022   bilateral wrist and hands   Hematuria    negative evaluation   High cholesterol    HTN (hypertension)    Internal hemorrhoids    Laryngitis    Lower back pain    Macular degeneration    Nonalcoholic steatohepatitis (NASH)    Nonalcoholic steatohepatitis (NASH)    Osteopenia    Left side hip   Seizures (HCC)    09/07/2019 pt reports last seizure in 2014   Tubular adenoma of colon    Past Surgical History:  Procedure Laterality Date   ABDOMINAL HYSTERECTOMY  1982   ADENOIDECTOMY     APPENDECTOMY     ATRIAL FIBRILLATION ABLATION N/A 03/13/2023   Procedure: ATRIAL FIBRILLATION ABLATION;  Surgeon: Inocencio Soyla Lunger, MD;  Location: [facility] INVASIVE CV LAB;  Service: Cardiovascular;  Laterality: N/A;  BACK SURGERY     BACK SURGERY  07/2015   BRONCHIAL WASHINGS  09/19/2023   Procedure: IRRIGATION, BRONCHUS;  Surgeon: Kara Dorn NOVAK, MD;  Location: [facility] ENDOSCOPY;  Service: Pulmonary;;   CARPAL TUNNEL RELEASE     CATARACT EXTRACTION, BILATERAL     CHOLECYSTECTOMY  2003   COLONOSCOPY     EXPLORATORY LAPAROTOMY     FLEXIBLE BRONCHOSCOPY Bilateral 09/19/2023   Procedure: BRONCHOSCOPY, FLEXIBLE;  Surgeon: Kara Dorn NOVAK, MD;  Location: [facility] ENDOSCOPY;  Service: Pulmonary;  Laterality: Bilateral;  bronchoscopy for BAL   LAMINECTOMY     RIGHT HEART CATH N/A 11/27/2023   Procedure: RIGHT  HEART CATH;  Surgeon: Cherrie Toribio SAUNDERS, MD;  Location: [facility] INVASIVE CV LAB;  Service: Cardiovascular;  Laterality: N/A;   SHOULDER SURGERY Right 2000   SINOSCOPY     TONSILLECTOMY     TOTAL HIP ARTHROPLASTY Right 02/12/2022   Procedure: RIGHT TOTAL HIP ARTHROPLASTY ANTERIOR APPROACH;  Surgeon: Vernetta Lonni GRADE, MD;  Location: [facility] OR;  Service: Orthopedics;  Laterality: Right;   Social History:  reports that she has quit smoking. Her smoking use included cigarettes. She started smoking about 46 years ago. She has a 69 pack-year smoking history. She has never used smokeless tobacco. She reports current alcohol  use of about 7.0 standard drinks of alcohol  per week. She reports that she does not use drugs.  Allergies[1]  Family History  Problem Relation Age of Onset   Cancer Mother        Cancer of the bone marrow   Heart attack Mother    Stroke Sister    Hypertension Sister    Diabetes Sister    Colon cancer Maternal Aunt    Seizures Neg Hx    Esophageal cancer Neg Hx    Rectal cancer Neg Hx    Stomach cancer Neg Hx    Breast cancer Neg Hx    BRCA 1/2 Neg Hx      Prior to Admission medications  Medication Sig Start Date End Date Taking? Authorizing Provider  acetaminophen  (TYLENOL ) 500 MG tablet Take 500 mg by mouth every 6 (six) hours as needed for moderate pain (pain score 4-6) or mild pain (pain score 1-3).   Yes [provider]  Alirocumab  (PRALUENT ) 150 MG/ML SOAJ Inject 1 mL (150 mg total) into the skin every 14 (fourteen) days. 07/16/23  Yes Shayne Anes, MD  AMBULATORY NON FORMULARY MEDICATION Medication name: Diltiazem  2% with 5% lidocaine  Using your index finger, apply a small amount of medication inside the rectum up to your first knuckle/joint 2-3 times daily for 6-8 weeks. 03/16/24  Yes Arletta Camie BRAVO, PA-C  amLODipine  (NORVASC ) 2.5 MG tablet Take 1 tablet (2.5 mg total) by mouth at bedtime. 12/02/22  Yes Perini, Anes, MD  apixaban  (ELIQUIS ) 5 MG TABS tablet  TAKE 1 TABLET(5 MG) BY MOUTH TWICE DAILY 03/15/24  Yes Croitoru, Mihai, MD  beclomethasone (QVAR  REDIHALER) 80 MCG/ACT inhaler Inhale 1 puff into the lungs 2 (two) times daily. 07/31/23  Yes Kozlow, Eric J, MD  calcitonin, salmon, (MIACALCIN/FORTICAL) 200 UNIT/ACT nasal spray Place 1 spray into alternate nostrils daily. 04/19/24  Yes [provider]  Calcium Carb-Cholecalciferol  (CALTRATE 600+D3 PO) Take 1 tablet by mouth daily.   Yes [provider]  Cholecalciferol  (VITAMIN D ) 50 MCG (2000 UT) tablet Take 2,000 Units by mouth daily.   Yes [provider]  Coenzyme Q10 (CO Q 10 PO) Take 200 mg by mouth daily.   Yes [provider]  diphenoxylate -atropine  (LOMOTIL ) 2.5-0.025 MG tablet Take 1-2 tablets by mouth every 4 hours as needed for diarrhea/loose stools 09/08/23  Yes Pyrtle, Gordy HERO, MD  DULoxetine  (CYMBALTA ) 30 MG capsule Take 1 capsule (30 mg total) by mouth every evening. 06/02/23  Yes Shayne Anes, MD  ezetimibe  (ZETIA ) 10 MG tablet Take 10 mg by mouth every evening.   Yes [provider]  furosemide  (LASIX ) 40 MG tablet Take 2 tablets (80 mg total) by mouth daily. 05/18/24  Yes Croitoru, Mihai, MD  gabapentin  (NEURONTIN ) 800 MG tablet Take 1 tablet (800 mg total) by mouth 2 (two) times daily. 10/28/22  Yes Shayne Anes, MD  hydrocortisone  2.5 % ointment Apply 1 Application topically 2 (two) times daily. lips 05/26/24  Yes [provider]  ibandronate (BONIVA) 150 MG tablet Take 150 mg by mouth every 30 (thirty) days. 01/25/23  Yes [provider]  ibandronate (BONIVA) 150 MG tablet Take 150 mg by mouth every 30 (thirty) days. Take in the morning with a full glass of water, on an empty stomach, and do not take anything else by mouth or lie down for the next 30 min.   Yes [provider]  ipratropium (ATROVENT ) 0.03 % nasal spray Place 2 sprays into both nostrils every 12 (twelve) hours. Patient taking differently: Place 2 sprays  into both nostrils 2 (two) times daily as needed for rhinitis. 05/14/24  Yes Croitoru, Mihai, MD  ipratropium-albuterol  (DUONEB) 0.5-2.5 (3) MG/3ML SOLN Take 3 mLs by nebulization 3 (three) times daily as needed. Patient taking differently: Take 3 mLs by nebulization 3 (three) times daily as needed (difficulty breathing). 08/05/23  Yes Kara Dorn NOVAK, MD  loratadine  (CLARITIN ) 10 MG tablet Take 1 tablet (10 mg total) by mouth daily as needed for allergies (Can take an extra dose during flare ups.). 08/26/23  Yes Kozlow, Camellia PARAS, MD  methocarbamol  (ROBAXIN ) 500 MG tablet Take 1.5 tablets by mouth every 6 (six) hours as needed for muscle spasms. 03/03/24  Yes [provider]  metoprolol  tartrate (LOPRESSOR ) 50 MG tablet Take 1 tablet (50 mg total) by mouth 2 (two) times daily. 02/07/23  Yes Croitoru, Mihai, MD  montelukast  (SINGULAIR ) 10 MG tablet Take 10 mg by mouth at bedtime.   Yes [provider]  Multiple Vitamins-Minerals (MULTIVITAMIN PO) Take 1 tablet by mouth daily.   Yes [provider]  mupirocin ointment (BACTROBAN) 2 % Apply 1 Application topically 2 (two) times daily. 06/10/24  Yes [provider]  omeprazole (PRILOSEC) 20 MG capsule Take 20 mg by mouth 2 (two) times daily before a meal. 08/19/22 06/21/24 Yes [provider]  PHENobarbital  (LUMINAL) 97.2 MG tablet Take 1.5 tablets (145.8 mg total) by mouth daily. 12/31/23  Yes Millikan, Megan, NP  potassium chloride  SA (KLOR-CON  M) 20 MEQ tablet While taking 40 mg of Furosemide , take Potassium Chloride  20 meq twice a day. When you decrease Furosemide  to 20 mg, decrease Potassium Chloride  to 20 meq once a day. Patient taking differently: Take 20 mEq by mouth daily. 03/17/23  Yes Croitoru, Mihai, MD  PRALUENT  150 MG/ML SOAJ Inject 150 mg into the skin every 14 (fourteen) days. 10/03/21  Yes [provider]  pyridoxine  (B-6) 100 MG tablet Take 100 mg by mouth daily.   Yes [provider]   sacubitril -valsartan  (ENTRESTO ) 49-51 MG Take 1 tablet by mouth 2 (two) times daily.   Yes [provider]  topiramate  (TOPAMAX ) 50 MG tablet TAKE 1 TABLET BY MOUTH IN  THE MORNIGN& 2 TABS EVERY EVENING 02/24/24  Yes Millikan, Megan, NP  vancomycin  (VANCOCIN ) 125 MG capsule Take 1 capsule (125 mg total) by mouth 4 (four) times daily for 14 days. 06/18/24 07/02/24 Yes Pyrtle, Gordy HERO, MD  vitamin E 400 UNIT capsule Take 400 Units by mouth daily.   Yes [provider]  ZEPBOUND  10 MG/0.5ML Pen Inject 10 mg into the skin once a week. 03/22/24  Yes [provider]  colestipol  (COLESTID ) 1 g tablet Take 1 g by mouth 2 (two) times daily. Patient not taking: Reported on 06/21/2024    [provider]    Physical Exam: BP 138/73 (BP Location: Left Arm)   Pulse 87   Temp 97.8 F (36.6 C) (Oral)   Resp 18   Ht 4' 10 (1.473 m)   Wt 64.4 kg   LMP 07/01/1980   SpO2 98%   BMI 29.68 kg/m  General:  Alert, oriented, calm, in no acute distress, her son and husband are at the bedside.  She has obvious erythema, swelling and tenderness of the right lateral neck. Eyes: EOMI, clear conjuctivae, white sclerea Cardiovascular: Extremities are warm with good peripheral pulses.  No extremity edema seen. Respiratory: Equal bilateral chest rise, no cough, no evidence of respiratory distress.  Patient is stable on room air. Skin: dry, no rashes  Musculoskeletal: no joint effusions, normal range of motion  Psychiatric: appropriate affect, normal speech  Neurologic: extraocular muscles intact, clear speech, moving all extremities spontaneously          Labs on Admission:  Basic Metabolic Panel: Recent Labs  Lab 06/20/24 1519  NA 131*  K 3.0*  CL 93*  CO2 21*  GLUCOSE 145*  BUN 5*  CREATININE 0.51  CALCIUM 9.4  MG 2.1   Liver Function Tests: Recent Labs  Lab 06/20/24 1519  AST 112*  ALT 112*  ALKPHOS 139*  BILITOT 1.3*  PROT 8.6*  ALBUMIN  4.2   No results for  input(s): LIPASE, AMYLASE in the last 168 hours. No results for input(s): AMMONIA in the last 168 hours. CBC: Recent Labs  Lab 06/20/24 1519  WBC 19.3*  NEUTROABS 15.7*  HGB 13.1  HCT 37.2  MCV 93.5  PLT 197   Cardiac Enzymes: Recent Labs  Lab 06/20/24 1519  CKTOTAL 65   BNP (last 3 results) Recent Labs    11/19/23 0000  BNP 224.5*    ProBNP (last 3 results) No results for input(s): PROBNP in the last 8760 hours.  CBG: No results for input(s): GLUCAP in the last 168 hours.  Radiological Exams on Admission: CT Soft Tissue Neck W Contrast Result Date: 06/20/2024 EXAM: CT NECK WITH CONTRAST 06/20/2024 04:29:30 PM TECHNIQUE: CT of the neck was performed with the administration of 75 mL of iohexol  (OMNIPAQUE ) 300 MG/ML solution. Multiplanar reformatted images are provided for review. Automated exposure control, iterative reconstruction, and/or weight based adjustment of the mA/kV was utilized to reduce the radiation dose to as low as reasonably achievable. COMPARISON: None available. CLINICAL HISTORY: Soft tissue infection suspected, neck, xray done. Right-sided facial swelling 4 days. Question abscess. FINDINGS: AERODIGESTIVE TRACT: Ruthie is present in the right parapharyngeal fat with some mass effect on the right side of the oropharynx. SALIVARY GLANDS: Extensive edematous changes are present within an enlarged right parotid gland. No discrete mass lesion or ducts obstruction is present. The submandibular glands are unremarkable. THYROID : A 1.8 cm incidental right thyroid  nodule is present. LYMPH NODES: Enlarged reactive right submandibular and level  2b lymph nodes are present. SOFT TISSUES: Stranding extends along the right side of the face and neck with slight thickening of the platysma. BONES: Straightening of the normal cervical lordosis is present with uncovertebral and facet hypertrophy contributed to multilevel foraminal stenosis. OTHER: Visualized sinuses and  mastoid air cells are well aerated. Visualized lungs are clear. IMPRESSION: 1. Extensive edematous changes in the enlarged right parotid gland with associated stranding in the right parapharyngeal fat and along the right side of the face and neck, consistent with infection, acute parotitis . No discrete abscess identified. 2. Enlarged reactive right submandibular and level 2b lymph nodes. 3. Incidental 1.8 cm right thyroid  nodule, with recommendation for non-emergent thyroid  ultrasound. Electronically signed by: Lonni Necessary MD 06/20/2024 04:40 PM EST RP Workstation: [id number]   DG Chest 2 View Result Date: 06/20/2024 EXAM: 2 VIEW(S) XRAY OF THE CHEST 06/20/2024 04:10:00 PM COMPARISON: 09/19/2023 CLINICAL HISTORY: cough FINDINGS: LUNGS AND PLEURA: No focal pulmonary opacity. No pleural effusion. No pneumothorax. HEART AND MEDIASTINUM: Aortic atherosclerosis. Cardiomegaly. BONES AND SOFT TISSUES: Old left rib fractures. Age indeterminate, mild compression fracture of a mid thoracic vertebral body favored to be T9. Osteopenia. Incompletely evaluated, partially visualized lumbar fusion hardware. IMPRESSION: 1. No acute cardiopulmonary abnormality. 2. Age indeterminate, mild compression fracture of a mid thoracic vertebral body, favored to be T9. Correlation with point tenderness recommended to assess for acuity. Electronically signed by: Rogelia Myers MD 06/20/2024 04:32 PM EST RP Workstation: [id number]   Assessment/Plan Amber Bennett is a 73 y.o. female with medical history significant for chronic heart failure with preserved EF, atrial fibrillation on Eliquis , hypertension, hypercholesterolemia, history of C. 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  Parotitis-with leukocytosis, no evidence of sepsis.  No discrete fluid  collection or abscess on CT.  ER provider discussed with [facility] on-call who recommends IV Solu-Medrol , empiric IV antibiotics. -Inpatient admission -Monitor on telemetry -Empiric IV Levaquin  and IV Flagyl , due to penicillin allergy -IV Solu-Medrol  x 3 doses -Plan to consult ENT for formal evaluation if lack of improvement  C. difficile colitis-she has prior history of this, developed diarrhea after taking a course of oral doxycycline which ended last week. -Continue empiric oral vancomycin  -Continue enteric precautions  Hypokalemia-likely due to GI losses from diarrhea  Abnormal LFTs-unclear etiology, patient without significant nausea or abdominal pain.  Diarrhea is improving. -Check Tylenol  level, acute hepatitis panel -Recheck CMP in the morning  Heart failure with preserved EF-patient appears slightly dry on exam, no evidence of acute heart failure. -Continue home diuretic as well as Lopressor  twice daily and Entresto   History of paroxysmal atrial fibrillation-continue Eliquis   Neuropathy-gabapentin   Hypertension-amlodipine , metoprolol   GERD-Protonix  daily    Code Status: Full Code  Consults called: ER provider discussed with [facility] as above.  Admission status: The appropriate patient status for this patient is INPATIENT. Inpatient status is judged to be reasonable and necessary in order to provide the required intensity of service to ensure the patient's safety. The patient's presenting symptoms, physical exam findings, and initial radiographic and laboratory data in the context of their chronic comorbidities is felt to place them at high risk for further clinical deterioration. Furthermore, it is not anticipated that the patient will be medically stable for discharge from the hospital within 2 midnights of admission.    I certify that at the  point of admission it is my clinical judgment that the patient will require inpatient hospital care spanning beyond 2 midnights from  the point of admission due to high intensity of service, high risk for further deterioration and high frequency of surveillance required  Time spent: 56 minutes  Jules Baty CHRISTELLA Gail MD [facility] Pager [phone]  If 7PM-7AM, please contact night-coverage [email or web address] Password [facility]  06/21/2024, 11:24 AM      [1]  Allergies Allergen Reactions   Penicillins Hives   Rosuvastatin Other (See Comments)    Other Reaction(s): skin rash and myalgia   Aspirin  Other (See Comments)    gi bleeding in 2005   Colesevelam Other (See Comments)    Other Reaction(s): has never tried it but would not be a good choice as all seizure meds would have to be taken at least four hours prior to the dose and with her regimen that just won't be possible.   Latex Itching and Rash   "

## 2024-06-21 NOTE — Telephone Encounter (Signed)
 Left message for pt to call back

## 2024-06-21 NOTE — Plan of Care (Signed)

## 2024-06-21 NOTE — ED Notes (Signed)
[facility] called for transport to [facility] 

## 2024-06-22 ENCOUNTER — Inpatient Hospital Stay (HOSPITAL_COMMUNITY)

## 2024-06-22 DIAGNOSIS — R7989 Other specified abnormal findings of blood chemistry: Secondary | ICD-10-CM

## 2024-06-22 DIAGNOSIS — E876 Hypokalemia: Secondary | ICD-10-CM | POA: Insufficient documentation

## 2024-06-22 DIAGNOSIS — R197 Diarrhea, unspecified: Secondary | ICD-10-CM | POA: Diagnosis not present

## 2024-06-22 DIAGNOSIS — G629 Polyneuropathy, unspecified: Secondary | ICD-10-CM

## 2024-06-22 DIAGNOSIS — A419 Sepsis, unspecified organism: Secondary | ICD-10-CM | POA: Insufficient documentation

## 2024-06-22 DIAGNOSIS — K219 Gastro-esophageal reflux disease without esophagitis: Secondary | ICD-10-CM | POA: Insufficient documentation

## 2024-06-22 DIAGNOSIS — K1120 Sialoadenitis, unspecified: Secondary | ICD-10-CM | POA: Diagnosis not present

## 2024-06-22 LAB — CBC
HCT: 39.3 % (ref 36.0–46.0)
Hemoglobin: 13.3 g/dL (ref 12.0–15.0)
MCH: 32.5 pg (ref 26.0–34.0)
MCHC: 33.8 g/dL (ref 30.0–36.0)
MCV: 96.1 fL (ref 80.0–100.0)
Platelets: 228 K/uL (ref 150–400)
RBC: 4.09 MIL/uL (ref 3.87–5.11)
RDW: 13.6 % (ref 11.5–15.5)
WBC: 22 K/uL — ABNORMAL HIGH (ref 4.0–10.5)
nRBC: 0 % (ref 0.0–0.2)

## 2024-06-22 LAB — GLUCOSE, CAPILLARY
Glucose-Capillary: 125 mg/dL — ABNORMAL HIGH (ref 70–99)
Glucose-Capillary: 122 mg/dL — ABNORMAL HIGH (ref 70–99)
Glucose-Capillary: 109 mg/dL — ABNORMAL HIGH (ref 70–99)
Glucose-Capillary: 188 mg/dL — ABNORMAL HIGH (ref 70–99)
Glucose-Capillary: 92 mg/dL (ref 70–99)

## 2024-06-22 LAB — COMPREHENSIVE METABOLIC PANEL WITH GFR
ALT: 149 U/L — ABNORMAL HIGH (ref 0–44)
AST: 148 U/L — ABNORMAL HIGH (ref 15–41)
Albumin: 3.6 g/dL (ref 3.5–5.0)
Alkaline Phosphatase: 144 U/L — ABNORMAL HIGH (ref 38–126)
Anion gap: 18 — ABNORMAL HIGH (ref 5–15)
BUN: 12 mg/dL (ref 8–23)
CO2: 19 mmol/L — ABNORMAL LOW (ref 22–32)
Calcium: 9.2 mg/dL (ref 8.9–10.3)
Chloride: 99 mmol/L (ref 98–111)
Creatinine, Ser: 0.64 mg/dL (ref 0.44–1.00)
GFR, Estimated: >60 mL/min
Glucose, Bld: 126 mg/dL — ABNORMAL HIGH (ref 70–99)
Potassium: 3.3 mmol/L — ABNORMAL LOW (ref 3.5–5.1)
Sodium: 136 mmol/L (ref 135–145)
Total Bilirubin: 0.7 mg/dL (ref 0.0–1.2)
Total Protein: 7.7 g/dL (ref 6.5–8.1)

## 2024-06-22 MED ORDER — DEXAMETHASONE SOD PHOSPHATE PF 10 MG/ML IJ SOLN
10.0000 mg | Freq: Three times a day (TID) | INTRAMUSCULAR | Status: DC
  Administered 2024-06-22 – 2024-06-24 (×6): 10 mg via INTRAVENOUS

## 2024-06-22 MED ORDER — HYDRALAZINE HCL 20 MG/ML IJ SOLN: 10.0000 mg | INTRAMUSCULAR | Status: DC | PRN

## 2024-06-22 MED ORDER — PANTOPRAZOLE SODIUM 40 MG IV SOLR
40.0000 mg | Freq: Every day | INTRAVENOUS | Status: DC
  Administered 2024-06-23: 40 mg via INTRAVENOUS
  Filled 2024-06-22: qty 10

## 2024-06-22 MED ORDER — HYDROMORPHONE HCL 1 MG/ML IJ SOLN
0.5000 mg | INTRAMUSCULAR | Status: DC | PRN
  Administered 2024-06-22 (×2): 1 mg via INTRAVENOUS
  Filled 2024-06-22 (×2): qty 1

## 2024-06-22 MED ORDER — IOHEXOL 300 MG/ML  SOLN
75.0000 mL | Freq: Once | INTRAMUSCULAR | Status: AC | PRN
  Administered 2024-06-22: 75 mL via INTRAVENOUS

## 2024-06-22 MED ORDER — METRONIDAZOLE 500 MG/100ML IV SOLN
500.0000 mg | Freq: Three times a day (TID) | INTRAVENOUS | Status: AC
  Administered 2024-06-22 (×2): 500 mg via INTRAVENOUS
  Filled 2024-06-22 (×2): qty 100

## 2024-06-22 MED ORDER — LABETALOL HCL 5 MG/ML IV SOLN: 10.0000 mg | INTRAVENOUS | Status: DC | PRN

## 2024-06-22 NOTE — Plan of Care (Signed)

## 2024-06-22 NOTE — Progress Notes (Signed)
" °   06/22/24 0350  BiPAP/CPAP/SIPAP  $ Non-Invasive Home Ventilator  Initial  BiPAP/CPAP/SIPAP Pt Type Adult  BiPAP/CPAP/SIPAP DREAMSTATIOND  Mask Type Nasal mask  Mask Size Medium  Patient Home Machine No  Patient Home Mask No  Patient Home Tubing No  Auto Titrate Yes  Minimum cmH2O 5 cmH2O  Maximum cmH2O 20 cmH2O  Device Plugged into RED Power Outlet Yes    "

## 2024-06-22 NOTE — Assessment & Plan Note (Addendum)
-   suspect abx induced from recent doxy course - Cdiff negative; was on empiric oral vanc PTA - given ongoing diarrhea and now with abx use and after discussion with family, will continue empiric coverage with PO vanc for now - WBC attributed to parotitis and recent steroid use as well

## 2024-06-22 NOTE — Progress Notes (Signed)
" °   06/22/24 0903  TOC Brief Assessment  Insurance and Status Reviewed  Patient has primary care physician Yes  Home environment has been reviewed single family home  Prior level of function: independent  Prior/Current Home Services No current home services  Social Drivers of Health Review SDOH reviewed no interventions necessary  Readmission risk has been reviewed Yes  Transition of care needs no transition of care needs at this time    Signed: Heather Saltness, MSW, LCSW Clinical Social Worker Inpatient Care Management 06/22/2024 9:04 AM   "

## 2024-06-22 NOTE — Progress Notes (Signed)
 MEWS Progress Note  Patient Details Name: Amber Bennett MRN: [id number] DOB: [birth year] Today's Date: 06/22/2024   MEWS Flowsheet Documentation:  Assess: MEWS Score Temp: (!) 101.4 F (38.6 C) BP: 100/64 MAP (mmHg): 75 Pulse Rate: 96 ECG Heart Rate: 88 Resp: 12 Level of Consciousness: Responds to Pain SpO2: 99 % O2 Device: Room Air Assess: MEWS Score MEWS Temp: 1 MEWS Systolic: 1 MEWS Pulse: 0 MEWS RR: 1 MEWS LOC: 2 MEWS Score: 5 MEWS Score Color: Red Assess: SIRS CRITERIA SIRS Temperature : 1 SIRS Respirations : 0 SIRS Pulse: 1 SIRS WBC: 0 SIRS Score Sum : 2 SIRS Temperature : 0 SIRS Pulse: 1 SIRS Respirations : 0 SIRS WBC: 0 SIRS Score Sum : 1 Assess: if the MEWS score is Yellow or Red Were vital signs accurate and taken at a resting state?: Yes Does the patient meet 2 or more of the SIRS criteria?: Yes Does the patient have a confirmed or suspected source of infection?: Yes MEWS guidelines implemented : Yes, red Treat MEWS Interventions: Considered administering scheduled or prn medications/treatments as ordered Take Vital Signs Increase Vital Sign Frequency : Red: Q1hr x2, continue Q4hrs until patient remains green for 12hrs Escalate MEWS: Escalate: Red: Discuss with charge nurse and notify provider. Consider notifying RRT. If remains red for 2 hours consider need for higher level of care Notify: Charge Nurse/RN Name of Charge Nurse/RN Notified: Engineer, Manufacturing, RN Provider Notification Provider Name/Title: Blondie Agent NP Date Provider Notified: 06/22/24 Time Provider Notified: 0149 Method of Notification: Page Notification Reason: Change in status Provider response: No new orders Date of Provider Response: 06/22/24 Notify: Rapid Response Name of Rapid Response RN Notified: Timber, RN Date Rapid Response Notified: 06/22/24 Time Rapid Response Notified: [id number]      [name] 06/22/2024, 1:51 AM

## 2024-06-22 NOTE — Progress Notes (Addendum)
 " Progress Note    Amber Bennett   FMW:[id number]  DOB: 1951/03/03  DOA: 06/20/2024     1 PCP: Shayne Anes, MD  Initial CC: right face swelling; diarrhea  Hospital Course: Amber Bennett is a 73 y.o. female with medical history significant for chronic heart failure with preserved EF, atrial fibrillation on Eliquis , hypertension, hypercholesterolemia, history of C. 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  She was recently on a course of doxycycline for approximately 10 days and after conclusion of course began developing copious watery diarrhea with associated mild abdominal cramping. She was prescribed oral vancomycin  empirically after discussion with outpatient gastroenterology.  Also prior to admission she began developing right facial swelling concerning for recurrent parotitis.  She had tried sour candies at home with no significant improvement.  CT neck showed right parotid gland enlargement with edema and associated stranding along right parapharyngeal fat plane; no obvious abscess noted. She was started on antibiotics and admitted for further monitoring.  Case was also discussed with ENT and she was also started on steroids.  Interval History:  Overnight she became more short of breath with difficulty swallowing and had episode of potential aspiration. This morning she is having much more difficulty swallowing.  CPAP also not able to be placed overnight due to parotitis.  Family present bedside this morning including son (Dr. Gentry) and husband. She is also having some urinary retention requiring straight cath this morning. Case also discussed with ENT.  Assessment and Plan: * Parotitis - hx parotitis; no treatment prior to admission aside from sour candy with no improvement - she reports mild  purulent taste with swallowing - initial CT on admission: Extensive edematous changes in the enlarged right parotid gland with associated stranding in the right parapharyngeal fat and along the right side of the face and neck, consistent with infection, acute parotitis . No discrete abscess identified. - now having worsening swallow ability, some gagging and concern for aspiration  - discussed with ENT again on 12/23; had 3 doses decadron  on admission, will continue further steroids - repeat CT to re-evaluate given clinical worsening or at least non-improvement - continue warm compresses; do not think safe for sour candies at this time until mentation/swallowing is safer; eventual SLP eval - Okay to hold p.o. meds until safer to swallow  Diarrhea - suspect abx induced from recent doxy course - Cdiff negative; was on empiric oral vanc PTA - d/c vanc - watch sxms clincally; no abdominal pain on exam - WBC attributed to parotitis and recent steroid use  Elevated LFTs - recently normal in April 2025 - mildly appearing cholestatic picture on admission, possibly cholestasis of sepsis; other considered etiology considered would be adverse effect from doxy - hep panel negative - trend LFTs for now  Sepsis (HCC) - Fever, tachycardia, leukocytosis.  Suspected parotitis -See separate problem.  Continue antibiotics  GERD (gastroesophageal reflux disease) - change PPI to IV  Neuropathy - hold gabapentin  for now  Hypokalemia - Replete as needed  OSA (obstructive sleep apnea) - Unable to tolerate CPAP at this time due to parotitis  Chronic diastolic heart failure (HCC) - no s/s exac at this time - Entresto  and Lopressor  at home  Paroxysmal atrial fibrillation (HCC) - hold Eliquis  and lopressor   Essential hypertension - hold meds for now until safer for  PO - PRN meds ordered   Antimicrobials: Unasyn  12/22 >> current  Flagyl  12/21 >> current   DVT prophylaxis:   Hold Eliquis   for now  Code Status:   Code Status: Full Code  Mobility Assessment (Last 72 Hours)     Mobility Assessment     Row Name 06/22/24 1200 06/21/24 2336 06/21/24 1000       Does the patient have exclusion criteria? No- Perform mobility assessment No- Perform mobility assessment No- Perform mobility assessment     What is the highest level of mobility based on the mobility assessment? Level 4 (Ambulates with assistance) - Balance while stepping forward/back - Complete Level 4 (Ambulates with assistance) - Balance while stepping forward/back - Complete Level 4 (Ambulates with assistance) - Balance while stepping forward/back - Complete        Diet: Diet Orders (From admission, onward)     Start     Ordered   06/22/24 1230  Diet NPO time specified Except for: Ice Chips  Diet effective now       Comments: Sour candy okay once mentation better  Question:  Except for  Answer:  Ice Chips   06/22/24 1229            Barriers to discharge: none Disposition Plan:  Home  HH orders placed: TBD Status is: Inpt   Objective: Blood pressure 139/66, pulse 93, temperature 98.5 F (36.9 C), resp. rate 18, height 4' 10 (1.473 m), weight 64.4 kg, last menstrual period 07/01/1980, SpO2 99%.  Examination:  Physical Exam Constitutional:      Comments: Mildly uncomfortable appearing and lethargic, falling asleep.  No distress and is oriented  HENT:     Head:     Comments: Significantly indurated right maxillary region extending from perioral area all the way to ear. TTP. Patchy erythema noted. No purulent drainage appreciated on face; unable to fully appreciate oral mucosa due to swelling and inability to open mouth wide     Mouth/Throat:     Mouth: Mucous membranes are dry.  Eyes:     Extraocular Movements: Extraocular movements intact.  Cardiovascular:     Rate and Rhythm: Normal rate and regular rhythm.  Pulmonary:     Effort: Pulmonary effort is normal. No respiratory distress.      Breath sounds: Normal breath sounds. No wheezing.  Abdominal:     General: Bowel sounds are normal. There is no distension.     Palpations: Abdomen is soft.     Tenderness: There is no abdominal tenderness.  Musculoskeletal:        General: Normal range of motion.     Cervical back: Normal range of motion and neck supple.  Skin:    General: Skin is warm and dry.  Neurological:     General: No focal deficit present.  Psychiatric:        Mood and Affect: Mood normal.      Consultants:  Discussed with ENT  Procedures:    Data Reviewed: Results for orders placed or performed during the hospital encounter of 06/20/24 (from the past 24 hours)  Hemoglobin A1c     Status: None   Collection Time: 06/21/24  1:01 PM  Result Value Ref Range   Hgb A1c MFr Bld 4.8 4.8 - 5.6 %   Mean Plasma Glucose 91.06 mg/dL  Acetaminophen  level     Status: Abnormal   Collection Time: 06/21/24  1:01 PM  Result Value Ref Range   Acetaminophen  (Tylenol ), Serum <10 (  L) 10 - 30 ug/mL  Hepatitis panel, acute     Status: None   Collection Time: 06/21/24  1:01 PM  Result Value Ref Range   Hepatitis B Surface Ag NON REACTIVE NON REACTIVE   HCV Ab NON REACTIVE NON REACTIVE   Hep A IgM NON REACTIVE NON REACTIVE   Hep B C IgM NON REACTIVE NON REACTIVE  C Difficile Quick Screen w PCR reflex     Status: None   Collection Time: 06/21/24  3:21 PM  Result Value Ref Range   C Diff antigen NEGATIVE NEGATIVE   C Diff toxin NEGATIVE NEGATIVE   C Diff interpretation No C. difficile detected.   Glucose, capillary     Status: Abnormal   Collection Time: 06/21/24  4:47 PM  Result Value Ref Range   Glucose-Capillary 162 (H) 70 - 99 mg/dL  Glucose, capillary     Status: Abnormal   Collection Time: 06/21/24  8:27 PM  Result Value Ref Range   Glucose-Capillary 106 (H) 70 - 99 mg/dL  Glucose, capillary     Status: Abnormal   Collection Time: 06/21/24 11:39 PM  Result Value Ref Range   Glucose-Capillary 130 (H) 70 -  99 mg/dL  Glucose, capillary     Status: Abnormal   Collection Time: 06/22/24  4:53 AM  Result Value Ref Range   Glucose-Capillary 125 (H) 70 - 99 mg/dL  CBC     Status: Abnormal   Collection Time: 06/22/24  5:48 AM  Result Value Ref Range   WBC 22.0 (H) 4.0 - 10.5 K/uL   RBC 4.09 3.87 - 5.11 MIL/uL   Hemoglobin 13.3 12.0 - 15.0 g/dL   HCT 60.6 63.9 - 53.9 %   MCV 96.1 80.0 - 100.0 fL   MCH 32.5 26.0 - 34.0 pg   MCHC 33.8 30.0 - 36.0 g/dL   RDW 86.3 88.4 - 84.4 %   Platelets 228 150 - 400 K/uL   nRBC 0.0 0.0 - 0.2 %  Comprehensive metabolic panel with GFR     Status: Abnormal   Collection Time: 06/22/24  5:48 AM  Result Value Ref Range   Sodium 136 135 - 145 mmol/L   Potassium 3.3 (L) 3.5 - 5.1 mmol/L   Chloride 99 98 - 111 mmol/L   CO2 19 (L) 22 - 32 mmol/L   Glucose, Bld 126 (H) 70 - 99 mg/dL   BUN 12 8 - 23 mg/dL   Creatinine, Ser 9.35 0.44 - 1.00 mg/dL   Calcium 9.2 8.9 - 89.6 mg/dL   Total Protein 7.7 6.5 - 8.1 g/dL   Albumin  3.6 3.5 - 5.0 g/dL   AST 851 (H) 15 - 41 U/L   ALT 149 (H) 0 - 44 U/L   Alkaline Phosphatase 144 (H) 38 - 126 U/L   Total Bilirubin 0.7 0.0 - 1.2 mg/dL   GFR, Estimated >39 >39 mL/min   Anion gap 18 (H) 5 - 15  Glucose, capillary     Status: Abnormal   Collection Time: 06/22/24  7:19 AM  Result Value Ref Range   Glucose-Capillary 122 (H) 70 - 99 mg/dL   Comment 1 Notify RN   Glucose, capillary     Status: Abnormal   Collection Time: 06/22/24 11:27 AM  Result Value Ref Range   Glucose-Capillary 109 (H) 70 - 99 mg/dL   Comment 1 Notify RN     I have reviewed pertinent nursing notes, vitals, labs, and images as necessary. I have ordered labwork  to follow up on as indicated.  I have reviewed the last notes from staff over past 24 hours. I have discussed patient's care plan and test results with nursing staff, CM/SW, and other staff as appropriate.  Old records reviewed in assessment of this patient  Time spent: Greater than 50% of the 55  minute visit was spent in counseling/coordination of care for the patient as laid out in the A&P.   LOS: 1 day   Alm Apo, MD [facility] 06/22/2024, 12:52 PM "

## 2024-06-22 NOTE — Assessment & Plan Note (Signed)
-   Fever, tachycardia, leukocytosis.  Suspected parotitis -See separate problem.  Continue antibiotics

## 2024-06-22 NOTE — Assessment & Plan Note (Addendum)
-   will resume home meds as swallow is better

## 2024-06-22 NOTE — Assessment & Plan Note (Addendum)
-   hx parotitis; no treatment prior to admission aside from sour candy with no improvement - she reports mild purulent taste with swallowing - initial CT on admission: Extensive edematous changes in the enlarged right parotid gland with associated stranding in the right parapharyngeal fat and along the right side of the face and neck, consistent with infection, acute parotitis . No discrete abscess identified. - now having worsening swallow ability, some gagging and concern for aspiration  - discussed with ENT again on 12/23; had 3 doses decadron  on admission, will continue further steroids - CT repeated 12/23: Increased inflammatory changes and surrounding cellulitis however still no abscess -As of 06/23/2024, actually has some improvement in terms of induration and pain involving area of concern - Tolerating clear liquids much better; still unable to chew and likely will need pured diet when we advance.  Evaluated by SLP as well, appreciate assistance - continue sour candies and warm compresses - continue unasyn  and flagyl

## 2024-06-22 NOTE — Assessment & Plan Note (Addendum)
-   recently normal in April 2025 - mildly appearing cholestatic picture on admission, possibly cholestasis of sepsis; other considered etiology considered would be adverse effect from doxy - hep panel negative - Still suspect this was cholestasis in setting of infection and LFTs have improved at time of discharge

## 2024-06-22 NOTE — Progress Notes (Signed)
 SLP Cancellation Note  Patient Details Name: PAVNEET MARKWOOD MRN: [id number] DOB: 27-Dec-1950   Cancelled treatment:       Reason Eval/Treat Not Completed: Other (comment) Secure message chat with MD who advised to defer swallow evaluation until at least tomorrow. SLP will follow for readiness.    Norleen IVAR Blase, MA, CCC-SLP Speech Therapy  06/22/2024, 2:09 PM

## 2024-06-22 NOTE — Hospital Course (Signed)
 Amber Bennett is a 73 y.o. female with medical history significant for chronic heart failure with preserved EF, atrial fibrillation on Eliquis , hypertension, hypercholesterolemia, history of C. 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  She was recently on a course of doxycycline for approximately 10 days and after conclusion of course began developing copious watery diarrhea with associated mild abdominal cramping. She was prescribed oral vancomycin  empirically after discussion with outpatient gastroenterology.  Also prior to admission she began developing right facial swelling concerning for recurrent parotitis.  She had tried sour candies at home with no significant improvement.  CT neck showed right parotid gland enlargement with edema and associated stranding along right parapharyngeal fat plane; no obvious abscess noted. She was started on antibiotics and admitted for further monitoring.  Case was also discussed with ENT and she was also started on steroids.

## 2024-06-22 NOTE — Assessment & Plan Note (Addendum)
-   no s/s exac at this time - Entresto  and Lopressor  at home

## 2024-06-22 NOTE — Assessment & Plan Note (Signed)
 Replete as needed

## 2024-06-22 NOTE — Assessment & Plan Note (Signed)
-   Unable to tolerate CPAP at this time due to parotitis

## 2024-06-22 NOTE — Assessment & Plan Note (Addendum)
--   resume home PPI

## 2024-06-22 NOTE — Plan of Care (Signed)
" °  Problem: Activity: Goal: Risk for activity intolerance will decrease Outcome: Progressing   Problem: Coping: Goal: Level of anxiety will decrease Outcome: Progressing   Problem: Elimination: Goal: Will not experience complications related to bowel motility Outcome: Progressing Goal: Will not experience complications related to urinary retention Outcome: Progressing   Problem: Pain Managment: Goal: General experience of comfort will improve and/or be controlled Outcome: Progressing   Problem: Safety: Goal: Ability to remain free from injury will improve Outcome: Progressing   Problem: Skin Integrity: Goal: Risk for impaired skin integrity will decrease Outcome: Progressing   Problem: Skin Integrity: Goal: Risk for impaired skin integrity will decrease Outcome: Progressing   Problem: Tissue Perfusion: Goal: Adequacy of tissue perfusion will improve Outcome: Progressing   "

## 2024-06-22 NOTE — Assessment & Plan Note (Addendum)
 Resume gabapentin

## 2024-06-22 NOTE — Assessment & Plan Note (Addendum)
-   resume Eliquis  and lopressor

## 2024-06-22 NOTE — Progress Notes (Signed)
" °   06/22/24 2354  BiPAP/CPAP/SIPAP  BiPAP/CPAP/SIPAP Pt Type Adult  BiPAP/CPAP/SIPAP DREAMSTATIOND  Mask Type Nasal mask  Patient Home Machine No  Patient Home Mask Yes  Patient Home Tubing Yes  Auto Titrate Yes  Minimum cmH2O 5 cmH2O  Maximum cmH2O 20 cmH2O  Device Plugged into RED Power Outlet Yes    "

## 2024-06-23 DIAGNOSIS — R197 Diarrhea, unspecified: Secondary | ICD-10-CM | POA: Diagnosis not present

## 2024-06-23 DIAGNOSIS — K1120 Sialoadenitis, unspecified: Secondary | ICD-10-CM | POA: Diagnosis not present

## 2024-06-23 LAB — URINE CULTURE: Culture: 60000 — AB

## 2024-06-23 LAB — GLUCOSE, CAPILLARY
Glucose-Capillary: 126 mg/dL — ABNORMAL HIGH (ref 70–99)
Glucose-Capillary: 128 mg/dL — ABNORMAL HIGH (ref 70–99)
Glucose-Capillary: 148 mg/dL — ABNORMAL HIGH (ref 70–99)
Glucose-Capillary: 148 mg/dL — ABNORMAL HIGH (ref 70–99)

## 2024-06-23 LAB — CBC WITH DIFFERENTIAL/PLATELET
Abs Immature Granulocytes: 0.12 K/uL — ABNORMAL HIGH (ref 0.00–0.07)
Basophils Absolute: 0 K/uL (ref 0.0–0.1)
Basophils Relative: 0 %
Eosinophils Absolute: 0 K/uL (ref 0.0–0.5)
Eosinophils Relative: 0 %
HCT: 35.4 % — ABNORMAL LOW (ref 36.0–46.0)
Hemoglobin: 12.4 g/dL (ref 12.0–15.0)
Immature Granulocytes: 1 %
Lymphocytes Relative: 9 %
Lymphs Abs: 1.3 K/uL (ref 0.7–4.0)
MCH: 32.9 pg (ref 26.0–34.0)
MCHC: 35 g/dL (ref 30.0–36.0)
MCV: 93.9 fL (ref 80.0–100.0)
Monocytes Absolute: 1 K/uL (ref 0.1–1.0)
Monocytes Relative: 7 %
Neutro Abs: 11.6 K/uL — ABNORMAL HIGH (ref 1.7–7.7)
Neutrophils Relative %: 83 %
Platelets: 219 K/uL (ref 150–400)
RBC: 3.77 MIL/uL — ABNORMAL LOW (ref 3.87–5.11)
RDW: 13.3 % (ref 11.5–15.5)
WBC: 14.1 K/uL — ABNORMAL HIGH (ref 4.0–10.5)
nRBC: 0 % (ref 0.0–0.2)

## 2024-06-23 LAB — BASIC METABOLIC PANEL WITH GFR
Anion gap: 14 (ref 5–15)
BUN: 6 mg/dL — ABNORMAL LOW (ref 8–23)
CO2: 22 mmol/L (ref 22–32)
Calcium: 8.8 mg/dL — ABNORMAL LOW (ref 8.9–10.3)
Chloride: 102 mmol/L (ref 98–111)
Creatinine, Ser: 0.32 mg/dL — ABNORMAL LOW (ref 0.44–1.00)
GFR, Estimated: >60 mL/min
Glucose, Bld: 116 mg/dL — ABNORMAL HIGH (ref 70–99)
Potassium: 2.8 mmol/L — ABNORMAL LOW (ref 3.5–5.1)
Sodium: 137 mmol/L (ref 135–145)

## 2024-06-23 LAB — MAGNESIUM: Magnesium: 2.1 mg/dL (ref 1.7–2.4)

## 2024-06-23 MED ORDER — DULOXETINE HCL 30 MG PO CPEP
30.0000 mg | ORAL_CAPSULE | Freq: Every evening | ORAL | Status: DC
  Administered 2024-06-23: 30 mg via ORAL
  Filled 2024-06-23: qty 1

## 2024-06-23 MED ORDER — GABAPENTIN 400 MG PO CAPS
800.0000 mg | ORAL_CAPSULE | Freq: Every day | ORAL | Status: DC
  Administered 2024-06-23: 800 mg via ORAL
  Filled 2024-06-23: qty 2

## 2024-06-23 MED ORDER — TOPIRAMATE 100 MG PO TABS
100.0000 mg | ORAL_TABLET | Freq: Every day | ORAL | Status: DC
  Administered 2024-06-23: 100 mg via ORAL
  Filled 2024-06-23: qty 1

## 2024-06-23 MED ORDER — SACUBITRIL-VALSARTAN 49-51 MG PO TABS
1.0000 | ORAL_TABLET | Freq: Two times a day (BID) | ORAL | Status: DC
  Administered 2024-06-23 (×2): 1 via ORAL
  Filled 2024-06-23 (×3): qty 1

## 2024-06-23 MED ORDER — METRONIDAZOLE 500 MG/100ML IV SOLN
500.0000 mg | Freq: Three times a day (TID) | INTRAVENOUS | Status: DC
  Administered 2024-06-23 – 2024-06-24 (×3): 500 mg via INTRAVENOUS
  Filled 2024-06-23 (×3): qty 100

## 2024-06-23 MED ORDER — GABAPENTIN 400 MG PO CAPS
800.0000 mg | ORAL_CAPSULE | Freq: Two times a day (BID) | ORAL | Status: DC
  Filled 2024-06-23: qty 2

## 2024-06-23 MED ORDER — EZETIMIBE 10 MG PO TABS
10.0000 mg | ORAL_TABLET | Freq: Every evening | ORAL | Status: DC
  Administered 2024-06-23: 10 mg via ORAL
  Filled 2024-06-23: qty 1

## 2024-06-23 MED ORDER — TOPIRAMATE 25 MG PO TABS
50.0000 mg | ORAL_TABLET | Freq: Every day | ORAL | Status: DC
  Administered 2024-06-23: 50 mg via ORAL
  Filled 2024-06-23: qty 2

## 2024-06-23 MED ORDER — VANCOMYCIN HCL 125 MG PO CAPS
125.0000 mg | ORAL_CAPSULE | Freq: Four times a day (QID) | ORAL | Status: DC
  Administered 2024-06-23 (×4): 125 mg via ORAL
  Filled 2024-06-23 (×7): qty 1

## 2024-06-23 MED ORDER — APIXABAN 5 MG PO TABS
5.0000 mg | ORAL_TABLET | Freq: Two times a day (BID) | ORAL | Status: DC
  Administered 2024-06-23 (×2): 5 mg via ORAL
  Filled 2024-06-23 (×2): qty 1

## 2024-06-23 MED ORDER — METOPROLOL TARTRATE 50 MG PO TABS
50.0000 mg | ORAL_TABLET | Freq: Two times a day (BID) | ORAL | Status: DC
  Administered 2024-06-23 (×2): 50 mg via ORAL
  Filled 2024-06-23 (×2): qty 1

## 2024-06-23 MED ORDER — POTASSIUM CHLORIDE 10 MEQ/100ML IV SOLN
10.0000 meq | INTRAVENOUS | Status: AC
  Administered 2024-06-23 (×4): 10 meq via INTRAVENOUS
  Filled 2024-06-23 (×4): qty 100

## 2024-06-23 MED ORDER — MONTELUKAST SODIUM 10 MG PO TABS
10.0000 mg | ORAL_TABLET | Freq: Every day | ORAL | Status: DC
  Administered 2024-06-23: 10 mg via ORAL
  Filled 2024-06-23: qty 1

## 2024-06-23 NOTE — Plan of Care (Signed)
" °  Problem: Education: Goal: Knowledge of General Education information will improve Description: Including pain rating scale, medication(s)/side effects and non-pharmacologic comfort measures Outcome: Progressing   Problem: Activity: Goal: Risk for activity intolerance will decrease Outcome: Progressing   Problem: Nutrition: Goal: Adequate nutrition will be maintained Outcome: Progressing   Problem: Elimination: Goal: Will not experience complications related to bowel motility Outcome: Progressing Goal: Will not experience complications related to urinary retention Outcome: Progressing   Problem: Pain Managment: Goal: General experience of comfort will improve and/or be controlled Outcome: Progressing   Problem: Safety: Goal: Ability to remain free from injury will improve Outcome: Progressing   Problem: Skin Integrity: Goal: Risk for impaired skin integrity will decrease Outcome: Progressing   Problem: Education: Goal: Ability to describe self-care measures that may prevent or decrease complications (Diabetes Survival Skills Education) will improve Outcome: Progressing   Problem: Coping: Goal: Ability to adjust to condition or change in health will improve Outcome: Progressing   "

## 2024-06-23 NOTE — Plan of Care (Signed)

## 2024-06-23 NOTE — Progress Notes (Signed)
 " Progress Note    Amber Bennett   FMW:[id number]  DOB: [birth year]  DOA: 06/20/2024     2 PCP: Shayne Anes, MD  Initial CC: right face swelling; diarrhea  Hospital Course: Amber Bennett is a 73 y.o. female with medical history significant for chronic heart failure with preserved EF, atrial fibrillation on Eliquis , hypertension, hypercholesterolemia, history of C. 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  She was recently on a course of doxycycline for approximately 10 days and after conclusion of course began developing copious watery diarrhea with associated mild abdominal cramping. She was prescribed oral vancomycin  empirically after discussion with outpatient gastroenterology.  Also prior to admission she began developing right facial swelling concerning for recurrent parotitis.  She had tried sour candies at home with no significant improvement.  CT neck showed right parotid gland enlargement with edema and associated stranding along right parapharyngeal fat plane; no obvious abscess noted. She was started on antibiotics and admitted for further monitoring.  Case was also discussed with ENT and she was also started on steroids.  Interval History:  Feeling better this morning.  Able to take in clear liquids well since yesterday and also has been able to void adequately. Going to attempt some sour candies today.  She has been using warm compresses. Pain and induration is also a little bit improved this morning. Potential discharge tomorrow if still improving.  Assessment and Plan: * Parotitis - hx parotitis; no treatment prior to admission aside from sour candy with no improvement - she reports mild purulent taste with swallowing - initial CT on admission: Extensive edematous changes in the enlarged  right parotid gland with associated stranding in the right parapharyngeal fat and along the right side of the face and neck, consistent with infection, acute parotitis . No discrete abscess identified. - now having worsening swallow ability, some gagging and concern for aspiration  - discussed with ENT again on 12/23; had 3 doses decadron  on admission, will continue further steroids - CT repeated 12/23: Increased inflammatory changes and surrounding cellulitis however still no abscess -As of 06/23/2024, actually has some improvement in terms of induration and pain involving area of concern - Tolerating clear liquids much better; still unable to chew and likely will need pured diet when we advance.  Evaluated by SLP as well, appreciate assistance - continue sour candies and warm compresses - continue unasyn  and flagyl   Diarrhea - suspect abx induced from recent doxy course - Cdiff negative; was on empiric oral vanc PTA - given ongoing diarrhea and now with abx use and after discussion with family, will continue empiric coverage with PO vanc for now - WBC attributed to parotitis and recent steroid use as well   Elevated LFTs - recently normal in April 2025 - mildly appearing cholestatic picture on admission, possibly cholestasis of sepsis; other considered etiology considered would be adverse effect from doxy - hep panel negative - trend LFTs for now  Sepsis (HCC) - Fever, tachycardia, leukocytosis.  Suspected parotitis -See separate problem.  Continue antibiotics  GERD (gastroesophageal reflux disease) - change PPI to IV  Neuropathy - Resume gabapentin   Hypokalemia - Replete as needed  OSA (obstructive sleep apnea) - Unable to tolerate CPAP at this time due to parotitis  Chronic diastolic heart failure (HCC) - no s/s exac at this time -  Entresto  and Lopressor  at home  Paroxysmal atrial fibrillation (HCC) - resume Eliquis  and lopressor   Essential hypertension - will  resume home meds as swallow is better   Antimicrobials: Unasyn  12/22 >> current  Flagyl  12/21 >> current   DVT prophylaxis:   apixaban  (ELIQUIS ) tablet 5 mg Hold Eliquis  for now  Code Status:   Code Status: Full Code  Mobility Assessment (Last 72 Hours)     Mobility Assessment     Row Name 06/23/24 0811 06/22/24 2203 06/22/24 1200 06/21/24 2336 06/21/24 1000   Does the patient have exclusion criteria? No- Perform mobility assessment No- Perform mobility assessment No- Perform mobility assessment No- Perform mobility assessment No- Perform mobility assessment   What is the highest level of mobility based on the mobility assessment? Level 4 (Ambulates with assistance) - Balance while stepping forward/back - Complete Level 4 (Ambulates with assistance) - Balance while stepping forward/back - Complete Level 4 (Ambulates with assistance) - Balance while stepping forward/back - Complete Level 4 (Ambulates with assistance) - Balance while stepping forward/back - Complete Level 4 (Ambulates with assistance) - Balance while stepping forward/back - Complete      Diet: Diet Orders (From admission, onward)     Start     Ordered   06/22/24 1637  Diet clear liquid Room service appropriate? Yes; Fluid consistency: Thin  Diet effective now       Comments: Okay for sour candies as well  Question Answer Comment  Room service appropriate? Yes   Fluid consistency: Thin      06/22/24 1636            Barriers to discharge: none Disposition Plan:  Home  HH orders placed: TBD Status is: Inpt   Objective: Blood pressure (!) 148/71, pulse 89, temperature 97.8 F (36.6 C), temperature source Oral, resp. rate 16, height 4' 10 (1.473 m), weight 64.4 kg, last menstrual period 07/01/1980, SpO2 100%.  Examination:  Physical Exam Constitutional:      Comments: Mildly uncomfortable appearing and lethargic, falling asleep.  No distress and is oriented  HENT:     Bennett:     Comments: Significantly  indurated (but mildly improved today) right maxillary region extending from perioral area all the way to ear. TTP (also improved some). Patchy erythema noted (improved). No purulent drainage appreciated on face; unable to fully appreciate oral mucosa due to small mouth opening but no obvious purulent drainage around parotid duct     Mouth/Throat:     Mouth: Mucous membranes are moist.  Eyes:     Extraocular Movements: Extraocular movements intact.  Cardiovascular:     Rate and Rhythm: Normal rate and regular rhythm.  Pulmonary:     Effort: Pulmonary effort is normal. No respiratory distress.     Breath sounds: Normal breath sounds. No wheezing.  Abdominal:     General: Bowel sounds are normal. There is no distension.     Palpations: Abdomen is soft.     Tenderness: There is no abdominal tenderness.  Musculoskeletal:        General: Normal range of motion.     Cervical back: Normal range of motion and neck supple.  Skin:    General: Skin is warm and dry.  Neurological:     General: No focal deficit present.  Psychiatric:        Mood and Affect: Mood normal.      Consultants:  Discussed with ENT  Procedures:    Data Reviewed: Results for orders placed or  performed during the hospital encounter of 06/20/24 (from the past 24 hours)  Glucose, capillary     Status: None   Collection Time: 06/22/24  9:01 PM  Result Value Ref Range   Glucose-Capillary 92 70 - 99 mg/dL  Basic metabolic panel with GFR     Status: Abnormal   Collection Time: 06/23/24  5:32 AM  Result Value Ref Range   Sodium 137 135 - 145 mmol/L   Potassium 2.8 (L) 3.5 - 5.1 mmol/L   Chloride 102 98 - 111 mmol/L   CO2 22 22 - 32 mmol/L   Glucose, Bld 116 (H) 70 - 99 mg/dL   BUN 6 (L) 8 - 23 mg/dL   Creatinine, Ser 9.67 (L) 0.44 - 1.00 mg/dL   Calcium 8.8 (L) 8.9 - 10.3 mg/dL   GFR, Estimated >39 >39 mL/min   Anion gap 14 5 - 15  CBC with Differential/Platelet     Status: Abnormal   Collection Time: 06/23/24   5:32 AM  Result Value Ref Range   WBC 14.1 (H) 4.0 - 10.5 K/uL   RBC 3.77 (L) 3.87 - 5.11 MIL/uL   Hemoglobin 12.4 12.0 - 15.0 g/dL   HCT 64.5 (L) 63.9 - 53.9 %   MCV 93.9 80.0 - 100.0 fL   MCH 32.9 26.0 - 34.0 pg   MCHC 35.0 30.0 - 36.0 g/dL   RDW 86.6 88.4 - 84.4 %   Platelets 219 150 - 400 K/uL   nRBC 0.0 0.0 - 0.2 %   Neutrophils Relative % 83 %   Neutro Abs 11.6 (H) 1.7 - 7.7 K/uL   Lymphocytes Relative 9 %   Lymphs Abs 1.3 0.7 - 4.0 K/uL   Monocytes Relative 7 %   Monocytes Absolute 1.0 0.1 - 1.0 K/uL   Eosinophils Relative 0 %   Eosinophils Absolute 0.0 0.0 - 0.5 K/uL   Basophils Relative 0 %   Basophils Absolute 0.0 0.0 - 0.1 K/uL   Immature Granulocytes 1 %   Abs Immature Granulocytes 0.12 (H) 0.00 - 0.07 K/uL  Magnesium     Status: None   Collection Time: 06/23/24  5:32 AM  Result Value Ref Range   Magnesium 2.1 1.7 - 2.4 mg/dL  Glucose, capillary     Status: Abnormal   Collection Time: 06/23/24  8:42 AM  Result Value Ref Range   Glucose-Capillary 126 (H) 70 - 99 mg/dL  Glucose, capillary     Status: Abnormal   Collection Time: 06/23/24 12:21 PM  Result Value Ref Range   Glucose-Capillary 128 (H) 70 - 99 mg/dL    I have reviewed pertinent nursing notes, vitals, labs, and images as necessary. I have ordered labwork to follow up on as indicated.  I have reviewed the last notes from staff over past 24 hours. I have discussed patient's care plan and test results with nursing staff, CM/SW, and other staff as appropriate.  Old records reviewed in assessment of this patient  Time spent: Greater than 50% of the 55 minute visit was spent in counseling/coordination of care for the patient as laid out in the A&P.   LOS: 2 days   Alm Apo, MD [facility] 06/23/2024, 5:15 PM "

## 2024-06-23 NOTE — Evaluation (Signed)
 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   Carpal tunnel syndrome    Cervical radiculitis    Chronic diarrhea 10/24/2023   Collar bone fracture    Depression    Dermatomyositis (HCC)    Diabetes mellitus without complication (HCC)    Dysrhythmia    Elevated liver enzymes    External hemorrhoids    Fatty liver    Fibromyalgia    Fibromyalgia affecting hand 08/2022   bilateral wrist and hands   Hematuria    negative evaluation   High cholesterol    HTN (hypertension)    Internal hemorrhoids    Laryngitis    Lower back pain    Macular degeneration    Nonalcoholic steatohepatitis (NASH)    Nonalcoholic steatohepatitis (NASH)    Osteopenia    Left side hip   Seizures (HCC)    09/07/2019 pt reports last seizure in 2014   Tubular adenoma of colon    Past Surgical History:  Past Surgical History:  Procedure Laterality Date   ABDOMINAL HYSTERECTOMY  1982   ADENOIDECTOMY     APPENDECTOMY     ATRIAL FIBRILLATION ABLATION N/A 03/13/2023   Procedure: ATRIAL FIBRILLATION ABLATION;  Surgeon: Inocencio Soyla Lunger, MD;  Location: [facility];  Service: Cardiovascular;  Laterality: N/A;   BACK SURGERY     BACK SURGERY  07/2015   BRONCHIAL WASHINGS  09/19/2023   Procedure: IRRIGATION, BRONCHUS;  Surgeon: Kara Dorn NOVAK, MD;  Location: [facility]OS[facility];  Service: Pulmonary;;   CARPAL TUNNEL RELEASE     CATARACT EXTRACTION, BILATERAL     CHOLECYSTECTOMY  2003   COLONOSCOPY     EXPLORATORY LAPAROTOMY     FLEXIBLE BRONCHOSCOPY Bilateral 09/19/2023    Procedure: BRONCHOSCOPY, FLEXIBLE;  Surgeon: Kara Dorn NOVAK, MD;  Location: [facility];  Service: Pulmonary;  Laterality: Bilateral;  bronchoscopy for BAL   LAMINECTOMY     RIGHT HEART CATH N/A 11/27/2023   Procedure: RIGHT HEART CATH;  Surgeon: Cherrie Toribio JONELLE, MD;  Location: [facility];  Service: Cardiovascular;  Laterality: N/A;   SHOULDER SURGERY Right 2000   SINOSCOPY     TONSILLECTOMY     TOTAL HIP ARTHROPLASTY Right 02/12/2022   Procedure: RIGHT TOTAL HIP ARTHROPLASTY ANTERIOR APPROACH;  Surgeon: Vernetta Lonni GRADE, MD;  Location: [facility] OR;  Service: Orthopedics;  Laterality: Right;   HPI:  Glenetta Kiger is a 73 y.o. female who presented to the hospital on 06/20/24 with recurrent right sided facial swelling and pain as well as diarrhea. She concluded a 10 day course of oral doxycycline approximately six days prior to admission and then started having watery diarrhea with mild abdominal cramping. Her GI doctor called inoral vancomycin  which she had been on past couple days before admission. She has had significant worsening of right sided facial swelling and significant pain in past 48 hours. In ER, CT  neck showed enlarged right parotid gland with associated fat stranding along right parapharyngael fat plane without discrete fluid collection, probable myositis in adjacent musculature. SLP swallow evaluation  ordered following patient becoming more SOB with difficulty swallowing and potential aspiration. Mentation was not adequate for SLP evaluation in AM of 12/23, but she improved in the PM and MD started her on clear liquids.  MBS completed in March of 2025 indicated her oropharyngeal swallow was [facility]. PMH: DM, fibromyalgia, HTN, laryngitis, chronic diarrhea.    Assessment / Plan / Recommendation  Clinical Impression  Recommend to advance diet as tolerated but not past puree solids until she has had more significant improvement in facial swelling and mandibular movement. No SLP  f/u warranted at this time.  Leonetta Mcgivern presents with a reversible oral dysphagia secondary to right sided facial swelling. This leads to limited (but improving) mandibular movement, decreased but effective bilabial closure but no overt s/s aspiration observed. Ms. Cortner denies any pain associated with swallowing and denies any difficulty when swallowing liquids or even small pills. She indicated that taking small sips and turning her head to the left helped her swallowing. She feels that overall, she is starting to improve but remains cautious with PO intake. CT chest did not note any focal pulmonary opacity. As patient appears to be improving with her ability to swallow liquid PO's as well as medications, SLP not recommending further skilled intervention.  SLP Visit Diagnosis: Dysphagia, oral phase (R13.11)    Aspiration Risk       Diet Recommendation Other (Comment) (continue clear liquids and advance as tolerated not past puree solids until more significant reduction in right sided facial swelling)    Liquid Administration via: Cup;Straw Medication Administration: Other (Comment) (as tolerated) Supervision: Patient able to self feed Compensations: Slow rate;Small sips/bites Postural Changes: Seated upright at 90 degrees    Other Recommendations Oral Care Recommendations: Oral care BID     Swallow Evaluation Recommendations     Assistance Recommended at Discharge    Functional Status Assessment Patient has had a recent decline in their functional status and demonstrates the ability to make significant improvements in function in a reasonable and predictable amount of time.  Frequency and Duration            Prognosis        Swallow Study   General Date of Onset: 06/21/24 HPI: Amelita Risinger is a 73 y.o. female who presented to the hospital on 06/20/24 with recurrent right sided facial swelling and pain as well as diarrhea. She concluded a 10 day course of oral doxycycline  approximately six days prior to admission and then started having watery diarrhea with mild abdominal cramping. Her GI doctor called inoral vancomycin  which she had been on past couple days before admission. She has had significant worsening of right sided facial swelling and significant pain in past 48 hours. In ER, CT  neck showed enlarged right parotid gland with associated fat stranding along right parapharyngael fat plane without discrete fluid collection, probable myositis in adjacent musculature. SLP swallow evaluation ordered following patient becoming more SOB with difficulty swallowing and potential aspiration. Mentation was not adequate for SLP evaluation in AM of 12/23, but she improved in the PM and MD started her on clear liquids.  MBS completed in March of 2025 indicated her oropharyngeal swallow was [facility]. PMH: DM, fibromyalgia, HTN, laryngitis, chronic diarrhea. Type of Study: Bedside Swallow Evaluation Previous Swallow Assessment: MBS March 2025 Diet Prior to this Study: Clear liquid diet;Thin liquids (Level 0) Temperature Spikes Noted: No Respiratory Status: Room air History of Recent Intubation: No Behavior/Cognition: Alert;Cooperative;Pleasant mood Oral Cavity Assessment: Within Functional Limits Oral  Care Completed by SLP: No Oral Cavity - Dentition: Adequate natural dentition Vision: Functional for self-feeding Self-Feeding Abilities: Able to feed self Patient Positioning: Upright in bed Baseline Vocal Quality: Hoarse Volitional Cough: Strong Volitional Swallow: Able to elicit    Oral/Motor/Sensory Function Overall Oral Motor/Sensory Function: Mild impairment Facial ROM: Reduced right Facial Strength: Reduced right Mandible: Impaired   Ice Chips     Thin Liquid Thin Liquid: Impaired Presentation: Cup;Straw Oral Phase Impairments: Reduced labial seal    Nectar Thick     Honey Thick     Puree Puree: Not tested   Solid     Solid: Not tested      Norleen IVAR Blase,  MA, CCC-SLP Speech Therapy  06/23/2024,11:36 AM

## 2024-06-24 DIAGNOSIS — R7989 Other specified abnormal findings of blood chemistry: Secondary | ICD-10-CM | POA: Diagnosis not present

## 2024-06-24 DIAGNOSIS — K1120 Sialoadenitis, unspecified: Secondary | ICD-10-CM | POA: Diagnosis not present

## 2024-06-24 DIAGNOSIS — R197 Diarrhea, unspecified: Secondary | ICD-10-CM | POA: Diagnosis not present

## 2024-06-24 LAB — CBC WITH DIFFERENTIAL/PLATELET
Abs Immature Granulocytes: 0.27 K/uL — ABNORMAL HIGH (ref 0.00–0.07)
Basophils Absolute: 0.1 K/uL (ref 0.0–0.1)
Basophils Relative: 0 %
Eosinophils Absolute: 0 K/uL (ref 0.0–0.5)
Eosinophils Relative: 0 %
HCT: 38.8 % (ref 36.0–46.0)
Hemoglobin: 13.4 g/dL (ref 12.0–15.0)
Immature Granulocytes: 2 %
Lymphocytes Relative: 12 %
Lymphs Abs: 1.9 K/uL (ref 0.7–4.0)
MCH: 32.4 pg (ref 26.0–34.0)
MCHC: 34.5 g/dL (ref 30.0–36.0)
MCV: 93.7 fL (ref 80.0–100.0)
Monocytes Absolute: 1.1 K/uL — ABNORMAL HIGH (ref 0.1–1.0)
Monocytes Relative: 7 %
Neutro Abs: 12.7 K/uL — ABNORMAL HIGH (ref 1.7–7.7)
Neutrophils Relative %: 79 %
Platelets: 318 K/uL (ref 150–400)
RBC: 4.14 MIL/uL (ref 3.87–5.11)
RDW: 13 % (ref 11.5–15.5)
WBC: 16 K/uL — ABNORMAL HIGH (ref 4.0–10.5)
nRBC: 0 % (ref 0.0–0.2)

## 2024-06-24 LAB — COMPREHENSIVE METABOLIC PANEL WITH GFR
ALT: 140 U/L — ABNORMAL HIGH (ref 0–44)
AST: 70 U/L — ABNORMAL HIGH (ref 15–41)
Albumin: 3.3 g/dL — ABNORMAL LOW (ref 3.5–5.0)
Alkaline Phosphatase: 156 U/L — ABNORMAL HIGH (ref 38–126)
Anion gap: 17 — ABNORMAL HIGH (ref 5–15)
BUN: 8 mg/dL (ref 8–23)
CO2: 19 mmol/L — ABNORMAL LOW (ref 22–32)
Calcium: 9.4 mg/dL (ref 8.9–10.3)
Chloride: 101 mmol/L (ref 98–111)
Creatinine, Ser: 0.4 mg/dL — ABNORMAL LOW (ref 0.44–1.00)
GFR, Estimated: >60 mL/min
Glucose, Bld: 126 mg/dL — ABNORMAL HIGH (ref 70–99)
Potassium: 3 mmol/L — ABNORMAL LOW (ref 3.5–5.1)
Sodium: 136 mmol/L (ref 135–145)
Total Bilirubin: 0.4 mg/dL (ref 0.0–1.2)
Total Protein: 7.5 g/dL (ref 6.5–8.1)

## 2024-06-24 LAB — GLUCOSE, CAPILLARY: Glucose-Capillary: 125 mg/dL — ABNORMAL HIGH (ref 70–99)

## 2024-06-24 LAB — MAGNESIUM: Magnesium: 2.1 mg/dL (ref 1.7–2.4)

## 2024-06-24 MED ORDER — OXYCODONE HCL 5 MG PO TABS: 2.5000 mg | ORAL_TABLET | Freq: Four times a day (QID) | ORAL | 0 refills | Status: DC | PRN

## 2024-06-24 MED ORDER — POTASSIUM CHLORIDE 10 MEQ/100ML IV SOLN
10.0000 meq | INTRAVENOUS | Status: DC
  Administered 2024-06-24: 10 meq via INTRAVENOUS
  Filled 2024-06-24: qty 100

## 2024-06-24 MED ORDER — AMOXICILLIN-POT CLAVULANATE 875-125 MG PO TABS: 1.0000 | ORAL_TABLET | Freq: Two times a day (BID) | ORAL | 0 refills | Status: AC

## 2024-06-24 MED ORDER — DEXAMETHASONE 6 MG PO TABS: 6.0000 mg | ORAL_TABLET | Freq: Two times a day (BID) | ORAL | 0 refills | Status: AC

## 2024-06-24 MED ORDER — ONDANSETRON 4 MG PO TBDP: 4.0000 mg | ORAL_TABLET | Freq: Three times a day (TID) | ORAL | 0 refills | Status: AC | PRN

## 2024-06-24 MED ORDER — POTASSIUM CHLORIDE 20 MEQ PO PACK
40.0000 meq | PACK | Freq: Once | ORAL | Status: AC
  Administered 2024-06-24: 40 meq via ORAL
  Filled 2024-06-24: qty 2

## 2024-06-24 MED ORDER — GUAIFENESIN-DM 100-10 MG/5ML PO SYRP
5.0000 mL | ORAL_SOLUTION | ORAL | Status: DC | PRN
  Administered 2024-06-24 (×2): 5 mL via ORAL
  Filled 2024-06-24 (×2): qty 5

## 2024-06-24 MED ORDER — PHENOL 1.4 % MT LIQD
1.0000 | OROMUCOSAL | Status: DC | PRN
  Administered 2024-06-24: 1 via OROMUCOSAL
  Filled 2024-06-24: qty 177

## 2024-06-24 NOTE — Discharge Instructions (Signed)
 Continue oral vancomycin  while on Augmentin . Okay if running out of vanc before Augmentin  completion especially if diarrhea resolves.   Advance diet further as tolerated and able.

## 2024-06-24 NOTE — Progress Notes (Signed)
 Discharge instructions reviewed with patient, verbalized understanding. All questions answered. All belongings accounted for. Patient to follow up with MD in  1-2 weeks. PIV removed. Assisted via WC to private vehicle.

## 2024-06-24 NOTE — Discharge Summary (Signed)
 " Physician Discharge Summary   Amber Bennett FMW:[id number] DOB: 05-20-1951 DOA: 06/20/2024  PCP: Shayne Anes, MD  Admit date: 06/20/2024 Discharge date: 06/24/2024  Admitted From: Home Disposition:  Home Discharging physician: Alm Apo, MD Barriers to discharge: none  Recommendations at discharge: Follow up with PCP and consider ENT referral if still necessary  Consider HHPT if needed Repeat LFTs to ensure further improvement   Discharge Condition: stable CODE STATUS: Full  Diet recommendation:  Diet Orders (From admission, onward)     Start     Ordered   06/23/24 1719  DIET - DYS 1 Room service appropriate? Yes; Fluid consistency: Thin  Diet effective now       Question Answer Comment  Room service appropriate? Yes   Fluid consistency: Thin      06/23/24 1718            Hospital Course: Amber Bennett is a 73 y.o. female with medical history significant for chronic heart failure with preserved EF, atrial fibrillation on Eliquis , hypertension, hypercholesterolemia, history of C. 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  She was recently on a course of doxycycline for approximately 10 days and after conclusion of course began developing copious watery diarrhea with associated mild abdominal cramping. She was prescribed oral vancomycin  empirically after discussion with outpatient gastroenterology.  Also prior to admission she began developing right facial swelling concerning for recurrent parotitis.  She had tried sour candies at home with no significant improvement.  CT neck showed right parotid gland enlargement with edema and associated stranding along right parapharyngeal fat plane; no obvious abscess noted. She was started on antibiotics and admitted for further monitoring.  Case was  also discussed with ENT and she was also started on steroids.  Assessment and Plan: * Parotitis - hx parotitis; no treatment prior to admission aside from sour candy with no improvement - she reports mild purulent taste with swallowing - initial CT on admission: Extensive edematous changes in the enlarged right parotid gland with associated stranding in the right parapharyngeal fat and along the right side of the face and neck, consistent with infection, acute parotitis . No discrete abscess identified. - as of 12/23, having worsening swallow ability, some gagging and concern for aspiration  - discussed with ENT again on 12/23; had 3 doses decadron  on admission, will continue further steroids - CT repeated 12/23: Increased inflammatory changes and surrounding cellulitis however still no abscess -As of 06/23/2024, actually has some improvement in terms of induration and pain involving area of concern - Tolerated clear liquids well and was progressed to pured diet which she continued to tolerate well and was recommended to continue advancing diet at discharge - Evaluated by SLP as well with same recommendations - Treated with Unasyn  and Flagyl  during hospitalization and transitioned to Augmentin  to complete total of 14-day course at discharge.  Also continued with Decadron  for 3 more days at discharge.  Pain and nausea meds also prescribed in case needed  Diarrhea - suspect abx induced from recent doxy course - Cdiff negative; was on empiric oral vanc PTA - given ongoing diarrhea and now with abx use and after discussion with family, will continue empiric coverage with PO vanc for now - WBC attributed to parotitis and recent steroid use as well   Elevated LFTs - recently normal in  April 2025 - mildly appearing cholestatic picture on admission, possibly cholestasis of sepsis; other considered etiology considered would be adverse effect from doxy - hep panel negative - Still suspect this was  cholestasis in setting of infection and LFTs have improved at time of discharge  Sepsis (HCC) - Fever, tachycardia, leukocytosis.  Suspected parotitis -See separate problem.  Continue antibiotics  GERD (gastroesophageal reflux disease) - resume home PPI  Neuropathy - Resume gabapentin   Hypokalemia - Replete as needed  OSA (obstructive sleep apnea) - Unable to tolerate CPAP at this time due to parotitis  Chronic diastolic heart failure (HCC) - no s/s exac at this time - Entresto  and Lopressor  at home  Paroxysmal atrial fibrillation (HCC) - resume Eliquis  and lopressor   Essential hypertension - will resume home meds as swallow is better   The patient's acute and chronic medical conditions were treated accordingly. On day of discharge, patient was felt deemed stable for discharge. Patient/family member advised to call PCP or come back to ER if needed.   Principal Diagnosis: Parotitis  Discharge Diagnoses: Active Hospital Problems   Diagnosis Date Noted   Parotitis 06/20/2024    Priority: 1.   Diarrhea 06/22/2024    Priority: 2.   Elevated LFTs 06/22/2024    Priority: 3.   Hypokalemia 06/22/2024   Neuropathy 06/22/2024   GERD (gastroesophageal reflux disease) 06/22/2024   Sepsis (HCC) 06/22/2024   Chronic diastolic heart failure (HCC) 11/19/2023   OSA (obstructive sleep apnea) 11/19/2023   Paroxysmal atrial fibrillation (HCC) 01/28/2023   Essential hypertension 06/02/2012    Resolved Hospital Problems  No resolved problems to display.     Discharge Instructions     Increase activity slowly   Complete by: As directed       Allergies as of 06/24/2024       Reactions   Penicillins Hives   Tolerated augmentin  numerous times   Rosuvastatin Other (See Comments)   Other Reaction(s): skin rash and myalgia   Aspirin  Other (See Comments)   gi bleeding in 2005   Colesevelam Other (See Comments)   Other Reaction(s): has never tried it but would not be a good  choice as all seizure meds would have to be taken at least four hours prior to the dose and with her regimen that just won't be possible.   Latex Itching, Rash        Medication List     TAKE these medications    acetaminophen  500 MG tablet Commonly known as: TYLENOL  Take 500 mg by mouth every 6 (six) hours as needed for moderate pain (pain score 4-6) or mild pain (pain score 1-3).   AMBULATORY NON FORMULARY MEDICATION Medication name: Diltiazem  2% with 5% lidocaine  Using your index finger, apply a small amount of medication inside the rectum up to your first knuckle/joint 2-3 times daily for 6-8 weeks.   amLODipine  2.5 MG tablet Commonly known as: NORVASC  Take 1 tablet (2.5 mg total) by mouth at bedtime.   amoxicillin -clavulanate 875-125 MG tablet Commonly known as: AUGMENTIN  Take 1 tablet by mouth 2 (two) times daily for 11 days.   calcitonin (salmon) 200 UNIT/ACT nasal spray Commonly known as: MIACALCIN/FORTICAL Place 1 spray into alternate nostrils daily.   CALTRATE 600+D3 PO Take 1 tablet by mouth daily.   CO Q 10 PO Take 200 mg by mouth daily.   colestipol  1 g tablet Commonly known as: COLESTID  Take 1 g by mouth 2 (two) times daily.   dexamethasone  6 MG tablet Commonly  known as: DECADRON  Take 1 tablet (6 mg total) by mouth 2 (two) times daily for 3 days.   diphenoxylate -atropine  2.5-0.025 MG tablet Commonly known as: LOMOTIL  Take 1-2 tablets by mouth every 4 hours as needed for diarrhea/loose stools   DULoxetine  30 MG capsule Commonly known as: CYMBALTA  Take 1 capsule (30 mg total) by mouth every evening.   Eliquis  5 MG Tabs tablet Generic drug: apixaban  TAKE 1 TABLET(5 MG) BY MOUTH TWICE DAILY   Entresto  49-51 MG Generic drug: sacubitril -valsartan  Take 1 tablet by mouth 2 (two) times daily.   ezetimibe  10 MG tablet Commonly known as: ZETIA  Take 10 mg by mouth every evening.   furosemide  40 MG tablet Commonly known as: LASIX  Take 2 tablets (80  mg total) by mouth daily.   gabapentin  800 MG tablet Commonly known as: NEURONTIN  Take 1 tablet (800 mg total) by mouth 2 (two) times daily. What changed: when to take this   hydrocortisone  2.5 % ointment Apply 1 Application topically 2 (two) times daily. lips   ibandronate 150 MG tablet Commonly known as: BONIVA Take 150 mg by mouth every 30 (thirty) days. Take in the morning with a full glass of water, on an empty stomach, and do not take anything else by mouth or lie down for the next 30 min.   ibandronate 150 MG tablet Commonly known as: BONIVA Take 150 mg by mouth every 30 (thirty) days.   ipratropium 0.03 % nasal spray Commonly known as: ATROVENT  Place 2 sprays into both nostrils every 12 (twelve) hours. What changed:  when to take this reasons to take this   ipratropium-albuterol  0.5-2.5 (3) MG/3ML Soln Commonly known as: DUONEB Take 3 mLs by nebulization 3 (three) times daily as needed. What changed: reasons to take this   loratadine  10 MG tablet Commonly known as: CLARITIN  Take 1 tablet (10 mg total) by mouth daily as needed for allergies (Can take an extra dose during flare ups.).   methocarbamol  500 MG tablet Commonly known as: ROBAXIN  Take 1.5 tablets by mouth every 6 (six) hours as needed for muscle spasms.   metoprolol  tartrate 50 MG tablet Commonly known as: LOPRESSOR  Take 1 tablet (50 mg total) by mouth 2 (two) times daily.   montelukast  10 MG tablet Commonly known as: SINGULAIR  Take 10 mg by mouth at bedtime.   MULTIVITAMIN PO Take 1 tablet by mouth daily.   mupirocin ointment 2 % Commonly known as: BACTROBAN Apply 1 Application topically 2 (two) times daily.   omeprazole 20 MG capsule Commonly known as: PRILOSEC Take 20 mg by mouth 2 (two) times daily before a meal.   ondansetron  4 MG disintegrating tablet Commonly known as: ZOFRAN -ODT Take 1 tablet (4 mg total) by mouth every 8 (eight) hours as needed for nausea or vomiting.   oxyCODONE   5 MG immediate release tablet Commonly known as: Roxicodone  Take 0.5-1 tablets (2.5-5 mg total) by mouth every 6 (six) hours as needed for severe pain (pain score 7-10) or breakthrough pain.   PHENobarbital  97.2 MG tablet Commonly known as: LUMINAL Take 1.5 tablets (145.8 mg total) by mouth daily.   potassium chloride  SA 20 MEQ tablet Commonly known as: KLOR-CON  M While taking 40 mg of Furosemide , take Potassium Chloride  20 meq twice a day. When you decrease Furosemide  to 20 mg, decrease Potassium Chloride  to 20 meq once a day. What changed:  how much to take how to take this when to take this additional instructions   Praluent  150 MG/ML Soaj Generic  drug: Alirocumab  Inject 150 mg into the skin every 14 (fourteen) days.   Praluent  150 MG/ML Soaj Generic drug: Alirocumab  Inject 1 mL (150 mg total) into the skin every 14 (fourteen) days.   pyridoxine  100 MG tablet Commonly known as: B-6 Take 100 mg by mouth daily.   Qvar  RediHaler 80 MCG/ACT inhaler Generic drug: beclomethasone Inhale 1 puff into the lungs 2 (two) times daily.   topiramate  50 MG tablet Commonly known as: TOPAMAX  TAKE 1 TABLET BY MOUTH IN THE MORNIGN& 2 TABS EVERY EVENING   vancomycin  125 MG capsule Commonly known as: VANCOCIN  Take 1 capsule (125 mg total) by mouth 4 (four) times daily for 14 days.   Vitamin D  50 MCG (2000 UT) tablet Take 2,000 Units by mouth daily.   vitamin E 180 MG (400 UNITS) capsule Take 400 Units by mouth daily.   Zepbound  10 MG/0.5ML Pen Generic drug: tirzepatide  Inject 10 mg into the skin once a week.        Follow-up Information     Shayne Anes, MD. Schedule an appointment as soon as possible for a visit in 1 week(s).   Specialty: Internal Medicine Contact information: [address] [place] [address] [phone]                Allergies[1]  Consultations: Discussed with ENT  Procedures:   Discharge Exam: BP (!) 166/78   Pulse 86   Temp  99.5 F (37.5 C) (Oral)   Resp 14   Ht 4' 10 (1.473 m)   Wt 64.4 kg   LMP 07/01/1980   SpO2 99%   BMI 29.68 kg/m  Physical Exam Constitutional:      Comments: Mildly uncomfortable appearing and lethargic, falling asleep.  No distress and is oriented  HENT:     Head:     Comments: indurated (but progressive improvement daily) right maxillary region extending from perioral area all the way to ear. TTP (also improved). Patchy erythema essentially resolved. No purulent drainage appreciated on face or oropharynx    Mouth/Throat:     Mouth: Mucous membranes are moist.  Eyes:     Extraocular Movements: Extraocular movements intact.  Cardiovascular:     Rate and Rhythm: Normal rate and regular rhythm.  Pulmonary:     Effort: Pulmonary effort is normal. No respiratory distress.     Breath sounds: Normal breath sounds. No wheezing.  Abdominal:     General: Bowel sounds are normal. There is no distension.     Palpations: Abdomen is soft.     Tenderness: There is no abdominal tenderness.  Musculoskeletal:        General: Normal range of motion.     Cervical back: Normal range of motion and neck supple.  Skin:    General: Skin is warm and dry.  Neurological:     General: No focal deficit present.  Psychiatric:        Mood and Affect: Mood normal.      The results of significant diagnostics from this hospitalization (including imaging, microbiology, ancillary and laboratory) are listed below for reference.   Microbiology: Recent Results (from the past 240 hours)  Blood culture (routine x 2)     Status: None (Preliminary result)   Collection Time: 06/20/24  3:19 PM   Specimen: BLOOD  Result Value Ref Range Status   Specimen Description   Final    BLOOD LEFT ANTECUBITAL Performed at [facility], [address], [place], [place] [address]  Special Requests   Final    BOTTLES DRAWN AEROBIC AND ANAEROBIC Blood Culture results may not be optimal due to an  inadequate volume of blood received in culture bottles Performed at [facility], [address], [place], [place] [address]    Culture   Final    NO GROWTH 4 DAYS Performed at [facility], [address] [place], [place] [address]    Report Status PENDING  Incomplete  Blood culture (routine x 2)     Status: None (Preliminary result)   Collection Time: 06/20/24  3:19 PM   Specimen: BLOOD  Result Value Ref Range Status   Specimen Description   Final    BLOOD RIGHT ANTECUBITAL Performed at [facility], [address], [place], [place] [address]    Special Requests   Final    BOTTLES DRAWN AEROBIC AND ANAEROBIC Blood Culture results may not be optimal due to an inadequate volume of blood received in culture bottles Performed at [facility], [address], [place], [place] [address]    Culture   Final    NO GROWTH 4 DAYS Performed at [facility], [address], [place], [place] [address]    Report Status PENDING  Incomplete  Resp panel by RT-PCR (RSV, Flu A&B, Covid) Anterior Nasal Swab     Status: None   Collection Time: 06/20/24  3:34 PM   Specimen: Anterior Nasal Swab  Result Value Ref Range Status   SARS Coronavirus 2 by RT PCR NEGATIVE NEGATIVE Final    Comment: (NOTE) SARS-CoV-2 target nucleic acids are NOT DETECTED.  The SARS-CoV-2 RNA is generally detectable in upper respiratory specimens during the acute phase of infection. The lowest concentration of SARS-CoV-2 viral copies this assay can detect is 138 copies/mL. A negative result does not preclude SARS-Cov-2 infection and should not be used as the sole basis for treatment or other patient management decisions. A negative result may occur with  improper specimen collection/handling, submission of specimen other than nasopharyngeal swab, presence of viral mutation(s) within the areas targeted by this assay, and inadequate number of  viral copies(<138 copies/mL). A negative result must be combined with clinical observations, patient history, and epidemiological information. The expected result is Negative.  Fact Sheet for Patients:  bloggercourse.com  Fact Sheet for Healthcare Providers:  seriousbroker.it  This test is no t yet approved or cleared by the [place]  FDA and  has been authorized for detection and/or diagnosis of SARS-CoV-2 by FDA under an Emergency Use Authorization (EUA). This EUA will remain  in effect (meaning this test can be used) for the duration of the COVID-19 declaration under Section 564(b)(1) of the Act, 21 U.S.C.section 360bbb-3(b)(1), unless the authorization is terminated  or revoked sooner.       Influenza A by PCR NEGATIVE NEGATIVE Final   Influenza B by PCR NEGATIVE NEGATIVE Final    Comment: (NOTE) The Xpert Xpress SARS-CoV-2/FLU/RSV plus assay is intended as an aid in the diagnosis of influenza from Nasopharyngeal swab specimens and should not be used as a sole basis for treatment. Nasal washings and aspirates are unacceptable for Xpert Xpress SARS-CoV-2/FLU/RSV testing.  Fact Sheet for Patients: bloggercourse.com  Fact Sheet for Healthcare Providers: seriousbroker.it  This test is not yet approved or cleared by the [place]  FDA and has been authorized for detection and/or diagnosis of SARS-CoV-2 by FDA under an Emergency Use Authorization (EUA). This EUA will remain in effect (meaning this test can be  used) for the duration of the COVID-19 declaration under Section 564(b)(1) of the Act, 21 U.S.C. section 360bbb-3(b)(1), unless the authorization is terminated or revoked.     Resp Syncytial Virus by PCR NEGATIVE NEGATIVE Final    Comment: (NOTE) Fact Sheet for Patients: bloggercourse.com  Fact Sheet for Healthcare  Providers: seriousbroker.it  This test is not yet approved or cleared by the [place]  FDA and has been authorized for detection and/or diagnosis of SARS-CoV-2 by FDA under an Emergency Use Authorization (EUA). This EUA will remain in effect (meaning this test can be used) for the duration of the COVID-19 declaration under Section 564(b)(1) of the Act, 21 U.S.C. section 360bbb-3(b)(1), unless the authorization is terminated or revoked.  Performed at [facility], [address], [place], [place] [address]   Urine Culture     Status: Abnormal   Collection Time: 06/20/24  3:34 PM   Specimen: Urine, Clean Catch  Result Value Ref Range Status   Specimen Description   Final    URINE, CLEAN CATCH Performed at [facility], [address], [place], [place] [address]    Special Requests   Final    NONE Performed at [facility], [address] [place], [place] [address]    Culture 60,000 COLONIES/mL ESCHERICHIA COLI (A)  Final   Report Status 06/23/2024 FINAL  Final   Organism ID, Bacteria ESCHERICHIA COLI (A)  Final      Susceptibility   Escherichia coli - MIC*    AMPICILLIN  <=2 SENSITIVE Sensitive     CEFAZOLIN  (URINE) Value in next row Sensitive      <=1 SENSITIVEThis is a modified FDA-approved test that has been validated and its performance characteristics determined by the reporting laboratory.  This laboratory is certified under the Clinical Laboratory Improvement Amendments CLIA as qualified to perform high complexity clinical laboratory testing.    CEFEPIME Value in next row Sensitive      <=1 SENSITIVEThis is a modified FDA-approved test that has been validated and its performance characteristics determined by the reporting laboratory.  This laboratory is certified under the Clinical Laboratory Improvement Amendments CLIA as qualified to perform high complexity clinical laboratory testing.     ERTAPENEM Value in next row Sensitive      <=1 SENSITIVEThis is a modified FDA-approved test that has been validated and its performance characteristics determined by the reporting laboratory.  This laboratory is certified under the Clinical Laboratory Improvement Amendments CLIA as qualified to perform high complexity clinical laboratory testing.    CEFTRIAXONE Value in next row Sensitive      <=1 SENSITIVEThis is a modified FDA-approved test that has been validated and its performance characteristics determined by the reporting laboratory.  This laboratory is certified under the Clinical Laboratory Improvement Amendments CLIA as qualified to perform high complexity clinical laboratory testing.    CIPROFLOXACIN  Value in next row Sensitive      <=1 SENSITIVEThis is a modified FDA-approved test that has been validated and its performance characteristics determined by the reporting laboratory.  This laboratory is certified under the Clinical Laboratory Improvement Amendments CLIA as qualified to perform high complexity clinical laboratory testing.    GENTAMICIN Value in next row Sensitive      <=1 SENSITIVEThis is a modified FDA-approved test that has been validated and its performance characteristics determined by the reporting laboratory.  This laboratory is certified under the Clinical Laboratory Improvement Amendments CLIA as qualified to perform high complexity clinical laboratory testing.    NITROFURANTOIN  Value in next row Sensitive      <=1 SENSITIVEThis is a modified FDA-approved test that has been validated and its performance characteristics determined by the reporting laboratory.  This laboratory is certified under the Clinical Laboratory Improvement Amendments CLIA as qualified to perform high complexity clinical laboratory testing.    TRIMETH/SULFA Value in next row Sensitive      <=1 SENSITIVEThis is a modified FDA-approved test that has been validated and its performance characteristics  determined by the reporting laboratory.  This laboratory is certified under the Clinical Laboratory Improvement Amendments CLIA as qualified to perform high complexity clinical laboratory testing.    AMPICILLIN /SULBACTAM Value in next row Sensitive      <=1 SENSITIVEThis is a modified FDA-approved test that has been validated and its performance characteristics determined by the reporting laboratory.  This laboratory is certified under the Clinical Laboratory Improvement Amendments CLIA as qualified to perform high complexity clinical laboratory testing.    PIP/TAZO Value in next row Sensitive      <=4 SENSITIVEThis is a modified FDA-approved test that has been validated and its performance characteristics determined by the reporting laboratory.  This laboratory is certified under the Clinical Laboratory Improvement Amendments CLIA as qualified to perform high complexity clinical laboratory testing.    MEROPENEM Value in next row Sensitive      <=4 SENSITIVEThis is a modified FDA-approved test that has been validated and its performance characteristics determined by the reporting laboratory.  This laboratory is certified under the Clinical Laboratory Improvement Amendments CLIA as qualified to perform high complexity clinical laboratory testing.    * 60,000 COLONIES/mL ESCHERICHIA COLI  C Difficile Quick Screen w PCR reflex     Status: None   Collection Time: 06/21/24  3:21 PM  Result Value Ref Range Status   C Diff antigen NEGATIVE NEGATIVE Final   C Diff toxin NEGATIVE NEGATIVE Final   C Diff interpretation No C. difficile detected.  Final    Comment: Performed at [facility], [address], [place], [place] [address]     Labs: BNP (last 3 results) Recent Labs    11/19/23 0000  BNP 224.5*   Basic Metabolic Panel: Recent Labs  Lab 06/20/24 1519 06/22/24 0548 06/23/24 0532 06/24/24 0548  NA 131* 136 137 136  K 3.0* 3.3* 2.8* 3.0*  CL 93* 99 102 101  CO2 21* 19*  22 19*  GLUCOSE 145* 126* 116* 126*  BUN 5* 12 6* 8  CREATININE 0.51 0.64 0.32* 0.40*  CALCIUM 9.4 9.2 8.8* 9.4  MG 2.1  --  2.1 2.1   Liver Function Tests: Recent Labs  Lab 06/20/24 1519 06/22/24 0548 06/24/24 0548  AST 112* 148* 70*  ALT 112* 149* 140*  ALKPHOS 139* 144* 156*  BILITOT 1.3* 0.7 0.4  PROT 8.6* 7.7 7.5  ALBUMIN  4.2 3.6 3.3*   No results for input(s): LIPASE, AMYLASE in the last 168 hours. No results for input(s): AMMONIA in the last 168 hours. CBC: Recent Labs  Lab 06/20/24 1519 06/22/24 0548 06/23/24 0532 06/24/24 0548  WBC 19.3* 22.0* 14.1* 16.0*  NEUTROABS 15.7*  --  11.6* 12.7*  HGB 13.1 13.3 12.4 13.4  HCT 37.2 39.3 35.4* 38.8  MCV 93.5 96.1 93.9 93.7  PLT 197 228 219 318   Cardiac Enzymes: Recent Labs  Lab 06/20/24 1519  CKTOTAL 65   BNP: Invalid input(s): POCBNP CBG: Recent Labs  Lab 06/23/24 0842 06/23/24 1221 06/23/24 1732 06/23/24 2203 06/24/24 0737  GLUCAP  126* 128* 148* 148* 125*   D-Dimer No results for input(s): DDIMER in the last 72 hours. Hgb A1c No results for input(s): HGBA1C in the last 72 hours. Lipid Profile No results for input(s): CHOL, HDL, LDLCALC, TRIG, CHOLHDL, LDLDIRECT in the last 72 hours. Thyroid  function studies No results for input(s): TSH, T4TOTAL, T3FREE, THYROIDAB in the last 72 hours.  Invalid input(s): FREET3 Anemia work up No results for input(s): VITAMINB12, FOLATE, FERRITIN, TIBC, IRON, RETICCTPCT in the last 72 hours. Urinalysis    Component Value Date/Time   COLORURINE YELLOW 06/20/2024 1534   APPEARANCEUR CLEAR 06/20/2024 1534   APPEARANCEUR Cloudy (A) 01/03/2023 1353   LABSPEC 1.021 06/20/2024 1534   PHURINE 7.5 06/20/2024 1534   GLUCOSEU NEGATIVE 06/20/2024 1534   HGBUR LARGE (A) 06/20/2024 1534   BILIRUBINUR NEGATIVE 06/20/2024 1534   BILIRUBINUR Negative 01/03/2023 1353   KETONESUR NEGATIVE 06/20/2024 1534   PROTEINUR TRACE (A)  06/20/2024 1534   NITRITE NEGATIVE 06/20/2024 1534   LEUKOCYTESUR MODERATE (A) 06/20/2024 1534   Sepsis Labs Recent Labs  Lab 06/20/24 1519 06/22/24 0548 06/23/24 0532 06/24/24 0548  WBC 19.3* 22.0* 14.1* 16.0*   Microbiology Recent Results (from the past 240 hours)  Blood culture (routine x 2)     Status: None (Preliminary result)   Collection Time: 06/20/24  3:19 PM   Specimen: BLOOD  Result Value Ref Range Status   Specimen Description   Final    BLOOD LEFT ANTECUBITAL Performed at [facility], [address] [place], [place] [address]    Special Requests   Final    BOTTLES DRAWN AEROBIC AND ANAEROBIC Blood Culture results may not be optimal due to an inadequate volume of blood received in culture bottles Performed at [facility], [address], [place], [place] [address]    Culture   Final    NO GROWTH 4 DAYS Performed at [facility], [address], [place], [place] [address]    Report Status PENDING  Incomplete  Blood culture (routine x 2)     Status: None (Preliminary result)   Collection Time: 06/20/24  3:19 PM   Specimen: BLOOD  Result Value Ref Range Status   Specimen Description   Final    BLOOD RIGHT ANTECUBITAL Performed at [facility], [address] [place], [place] [address]    Special Requests   Final    BOTTLES DRAWN AEROBIC AND ANAEROBIC Blood Culture results may not be optimal due to an inadequate volume of blood received in culture bottles Performed at [facility], [address], [place], [place] [address]    Culture   Final    NO GROWTH 4 DAYS Performed at [facility], [address] [place], [place] [address]    Report Status PENDING  Incomplete  Resp panel by RT-PCR (RSV, Flu A&B, Covid) Anterior Nasal Swab     Status: None   Collection Time: 06/20/24  3:34 PM   Specimen: Anterior Nasal Swab  Result Value Ref Range Status   SARS  Coronavirus 2 by RT PCR NEGATIVE NEGATIVE Final    Comment: (NOTE) SARS-CoV-2 target nucleic acids are NOT DETECTED.  The SARS-CoV-2 RNA is generally detectable in upper respiratory specimens during the acute phase of infection. The lowest concentration of SARS-CoV-2 viral copies this assay can detect is 138 copies/mL. A negative result does not preclude SARS-Cov-2 infection and should not be used as the sole basis for treatment or other patient management decisions.  A negative result may occur with  improper specimen collection/handling, submission of specimen other than nasopharyngeal swab, presence of viral mutation(s) within the areas targeted by this assay, and inadequate number of viral copies(<138 copies/mL). A negative result must be combined with clinical observations, patient history, and epidemiological information. The expected result is Negative.  Fact Sheet for Patients:  bloggercourse.com  Fact Sheet for Healthcare Providers:  seriousbroker.it  This test is no t yet approved or cleared by the [place]  FDA and  has been authorized for detection and/or diagnosis of SARS-CoV-2 by FDA under an Emergency Use Authorization (EUA). This EUA will remain  in effect (meaning this test can be used) for the duration of the COVID-19 declaration under Section 564(b)(1) of the Act, 21 U.S.C.section 360bbb-3(b)(1), unless the authorization is terminated  or revoked sooner.       Influenza A by PCR NEGATIVE NEGATIVE Final   Influenza B by PCR NEGATIVE NEGATIVE Final    Comment: (NOTE) The Xpert Xpress SARS-CoV-2/FLU/RSV plus assay is intended as an aid in the diagnosis of influenza from Nasopharyngeal swab specimens and should not be used as a sole basis for treatment. Nasal washings and aspirates are unacceptable for Xpert Xpress SARS-CoV-2/FLU/RSV testing.  Fact Sheet for  Patients: bloggercourse.com  Fact Sheet for Healthcare Providers: seriousbroker.it  This test is not yet approved or cleared by the [place]  FDA and has been authorized for detection and/or diagnosis of SARS-CoV-2 by FDA under an Emergency Use Authorization (EUA). This EUA will remain in effect (meaning this test can be used) for the duration of the COVID-19 declaration under Section 564(b)(1) of the Act, 21 U.S.C. section 360bbb-3(b)(1), unless the authorization is terminated or revoked.     Resp Syncytial Virus by PCR NEGATIVE NEGATIVE Final    Comment: (NOTE) Fact Sheet for Patients: bloggercourse.com  Fact Sheet for Healthcare Providers: seriousbroker.it  This test is not yet approved or cleared by the [place]  FDA and has been authorized for detection and/or diagnosis of SARS-CoV-2 by FDA under an Emergency Use Authorization (EUA). This EUA will remain in effect (meaning this test can be used) for the duration of the COVID-19 declaration under Section 564(b)(1) of the Act, 21 U.S.C. section 360bbb-3(b)(1), unless the authorization is terminated or revoked.  Performed at [facility], [address] [place], [place] [address]   Urine Culture     Status: Abnormal   Collection Time: 06/20/24  3:34 PM   Specimen: Urine, Clean Catch  Result Value Ref Range Status   Specimen Description   Final    URINE, CLEAN CATCH Performed at [facility], [address], [place], [place] [address]    Special Requests   Final    NONE Performed at [facility], [address] [place], [place] [address]    Culture 60,000 COLONIES/mL ESCHERICHIA COLI (A)  Final   Report Status 06/23/2024 FINAL  Final   Organism ID, Bacteria ESCHERICHIA COLI (A)  Final      Susceptibility   Escherichia coli - MIC*    AMPICILLIN  <=2  SENSITIVE Sensitive     CEFAZOLIN  (URINE) Value in next row Sensitive      <=1 SENSITIVEThis is a modified FDA-approved test that has been validated and its performance characteristics determined by the reporting laboratory.  This laboratory is certified under the Clinical Laboratory Improvement Amendments CLIA as qualified to perform high complexity clinical laboratory testing.    CEFEPIME Value in next row Sensitive      <=  1 SENSITIVEThis is a modified FDA-approved test that has been validated and its performance characteristics determined by the reporting laboratory.  This laboratory is certified under the Clinical Laboratory Improvement Amendments CLIA as qualified to perform high complexity clinical laboratory testing.    ERTAPENEM Value in next row Sensitive      <=1 SENSITIVEThis is a modified FDA-approved test that has been validated and its performance characteristics determined by the reporting laboratory.  This laboratory is certified under the Clinical Laboratory Improvement Amendments CLIA as qualified to perform high complexity clinical laboratory testing.    CEFTRIAXONE Value in next row Sensitive      <=1 SENSITIVEThis is a modified FDA-approved test that has been validated and its performance characteristics determined by the reporting laboratory.  This laboratory is certified under the Clinical Laboratory Improvement Amendments CLIA as qualified to perform high complexity clinical laboratory testing.    CIPROFLOXACIN  Value in next row Sensitive      <=1 SENSITIVEThis is a modified FDA-approved test that has been validated and its performance characteristics determined by the reporting laboratory.  This laboratory is certified under the Clinical Laboratory Improvement Amendments CLIA as qualified to perform high complexity clinical laboratory testing.    GENTAMICIN Value in next row Sensitive      <=1 SENSITIVEThis is a modified FDA-approved test that has been validated and its  performance characteristics determined by the reporting laboratory.  This laboratory is certified under the Clinical Laboratory Improvement Amendments CLIA as qualified to perform high complexity clinical laboratory testing.    NITROFURANTOIN Value in next row Sensitive      <=1 SENSITIVEThis is a modified FDA-approved test that has been validated and its performance characteristics determined by the reporting laboratory.  This laboratory is certified under the Clinical Laboratory Improvement Amendments CLIA as qualified to perform high complexity clinical laboratory testing.    TRIMETH/SULFA Value in next row Sensitive      <=1 SENSITIVEThis is a modified FDA-approved test that has been validated and its performance characteristics determined by the reporting laboratory.  This laboratory is certified under the Clinical Laboratory Improvement Amendments CLIA as qualified to perform high complexity clinical laboratory testing.    AMPICILLIN /SULBACTAM Value in next row Sensitive      <=1 SENSITIVEThis is a modified FDA-approved test that has been validated and its performance characteristics determined by the reporting laboratory.  This laboratory is certified under the Clinical Laboratory Improvement Amendments CLIA as qualified to perform high complexity clinical laboratory testing.    PIP/TAZO Value in next row Sensitive      <=4 SENSITIVEThis is a modified FDA-approved test that has been validated and its performance characteristics determined by the reporting laboratory.  This laboratory is certified under the Clinical Laboratory Improvement Amendments CLIA as qualified to perform high complexity clinical laboratory testing.    MEROPENEM Value in next row Sensitive      <=4 SENSITIVEThis is a modified FDA-approved test that has been validated and its performance characteristics determined by the reporting laboratory.  This laboratory is certified under the Clinical Laboratory Improvement Amendments CLIA  as qualified to perform high complexity clinical laboratory testing.    * 60,000 COLONIES/mL ESCHERICHIA COLI  C Difficile Quick Screen w PCR reflex     Status: None   Collection Time: 06/21/24  3:21 PM  Result Value Ref Range Status   C Diff antigen NEGATIVE NEGATIVE Final   C Diff toxin NEGATIVE NEGATIVE Final   C Diff interpretation No C. difficile detected.  Final    Comment: Performed at [facility], [address], [place], [place] [address]    Procedures/Studies: CT SOFT TISSUE NECK W CONTRAST Result Date: 06/22/2024 EXAM: CT NECK WITH CONTRAST 06/22/2024 01:43:17 PM TECHNIQUE: CT of the neck was performed with the administration of 75 mL of iohexol  (OMNIPAQUE ) 300 MG/ML solution. Multiplanar reformatted images are provided for review. Automated exposure control, iterative reconstruction, and/or weight based adjustment of the mA/kV was utilized to reduce the radiation dose to as low as reasonably achievable. The exam is degraded by motion artifact. COMPARISON: CT neck 06/20/2024. CLINICAL HISTORY: parotitis. More difficulty with swallowing FINDINGS: AERODIGESTIVE TRACT: There is mild effacement of the right are digested tract. There is effacement of the right parapharyngeal fat. There is mild retropharyngeal edema. SALIVARY GLANDS: Since prior exam there is increased intraparotid and periparotid edematous changes in keeping with parotitis and surrounding cellulitis. There is increased stranding in the adjacent deep and superficial facial and cervical soft tissues in keeping with cellulitis. There is thickening of the overlying platysma muscle. Inflammatory changes are inseparable from the adjacent musculature such as the right masseter and right sternocleidomastoid raising the likelihood of myositis. No discrete or organized/ drainable fluid collection is appreciated. THYROID : Re-demonstrated right thyroid  nodule. LYMPH NODES: There are again enlarged right level 1 and level  2 cervical lymph nodes, presumably reactive. BONES: There are multilevel degenerative changes of the cervical spine. VASCULATURE: The right internal jugular vein is focally compressed due to the adjacent edematous soft tissues. OTHER: Visualized sinuses and mastoid air cells are well aerated. Mild right apical paraseptal emphysema. IMPRESSION: 1. Since 06/20/2024, increased inflammatory changes associated with right parotitis and surrounding cellulitis. No organized or drainable fluid collection. Probable myositis in the adjacent musculature. 2. Enlarged right level 1/2 cervical lymph nodes, presumably reactive. 3. Incidental right thyroid  nodule, recommend non-emergent ultrasound. Electronically signed by: Prentice Spade MD 06/22/2024 05:12 PM EST RP Workstation: [id number]   DG CHEST PORT 1 VIEW Result Date: 06/22/2024 EXAM: PA AND LATERAL (2) VIEW(S) XRAY OF THE CHEST 06/22/2024 12:03:00 AM COMPARISON: PA and lateral chest 06/20/2024. CLINICAL HISTORY: [id number] Aspiration into airway [id number] Aspiration into airway. FINDINGS: LUNGS AND PLEURA: Inspiration is lower than previously. No focal pulmonary opacity. No pleural effusion. No pneumothorax. HEART AND MEDIASTINUM: Mild cardiomegaly. There is aortic atherosclerosis and tortuosity with stable mediastinum. BONES AND SOFT TISSUES: Healed fracture deformities of the left rib cage. No acute osseous abnormality. IMPRESSION: 1. No acute cardiopulmonary abnormality. Stable chest. Electronically signed by: Francis Quam MD 06/22/2024 12:08 AM EST RP Workstation: [id number]   CT Soft Tissue Neck W Contrast Result Date: 06/20/2024 EXAM: CT NECK WITH CONTRAST 06/20/2024 04:29:30 PM TECHNIQUE: CT of the neck was performed with the administration of 75 mL of iohexol  (OMNIPAQUE ) 300 MG/ML solution. Multiplanar reformatted images are provided for review. Automated exposure control, iterative reconstruction, and/or weight based adjustment of the mA/kV was utilized to  reduce the radiation dose to as low as reasonably achievable. COMPARISON: None available. CLINICAL HISTORY: Soft tissue infection suspected, neck, xray done. Right-sided facial swelling 4 days. Question abscess. FINDINGS: AERODIGESTIVE TRACT: Ruthie is present in the right parapharyngeal fat with some mass effect on the right side of the oropharynx. SALIVARY GLANDS: Extensive edematous changes are present within an enlarged right parotid gland. No discrete mass lesion or ducts obstruction is present. The submandibular glands are unremarkable. THYROID : A 1.8 cm incidental right thyroid  nodule is present. LYMPH NODES: Enlarged reactive right submandibular and level 2b lymph nodes  are present. SOFT TISSUES: Stranding extends along the right side of the face and neck with slight thickening of the platysma. BONES: Straightening of the normal cervical lordosis is present with uncovertebral and facet hypertrophy contributed to multilevel foraminal stenosis. OTHER: Visualized sinuses and mastoid air cells are well aerated. Visualized lungs are clear. IMPRESSION: 1. Extensive edematous changes in the enlarged right parotid gland with associated stranding in the right parapharyngeal fat and along the right side of the face and neck, consistent with infection, acute parotitis . No discrete abscess identified. 2. Enlarged reactive right submandibular and level 2b lymph nodes. 3. Incidental 1.8 cm right thyroid  nodule, with recommendation for non-emergent thyroid  ultrasound. Electronically signed by: Lonni Necessary MD 06/20/2024 04:40 PM EST RP Workstation: [id number]   DG Chest 2 View Result Date: 06/20/2024 EXAM: 2 VIEW(S) XRAY OF THE CHEST 06/20/2024 04:10:00 PM COMPARISON: 09/19/2023 CLINICAL HISTORY: cough FINDINGS: LUNGS AND PLEURA: No focal pulmonary opacity. No pleural effusion. No pneumothorax. HEART AND MEDIASTINUM: Aortic atherosclerosis. Cardiomegaly. BONES AND SOFT TISSUES: Old left rib fractures. Age  indeterminate, mild compression fracture of a mid thoracic vertebral body favored to be T9. Osteopenia. Incompletely evaluated, partially visualized lumbar fusion hardware. IMPRESSION: 1. No acute cardiopulmonary abnormality. 2. Age indeterminate, mild compression fracture of a mid thoracic vertebral body, favored to be T9. Correlation with point tenderness recommended to assess for acuity. Electronically signed by: Rogelia Myers MD 06/20/2024 04:32 PM EST RP Workstation: [id number]     Time coordinating discharge: Over 30 minutes    Alm Apo, MD  [facility] 06/24/2024, 3:17 PM    [1]  Allergies Allergen Reactions   Penicillins Hives    Tolerated augmentin  numerous times   Rosuvastatin Other (See Comments)    Other Reaction(s): skin rash and myalgia   Aspirin  Other (See Comments)    gi bleeding in 2005   Colesevelam Other (See Comments)    Other Reaction(s): has never tried it but would not be a good choice as all seizure meds would have to be taken at least four hours prior to the dose and with her regimen that just won't be possible.   Latex Itching and Rash   "

## 2024-06-24 NOTE — Plan of Care (Signed)

## 2024-06-25 LAB — CULTURE, BLOOD (ROUTINE X 2)
Culture: NO GROWTH
Culture: NO GROWTH

## 2024-07-07 ENCOUNTER — Other Ambulatory Visit: Payer: Self-pay | Admitting: Adult Health

## 2024-07-13 ENCOUNTER — Ambulatory Visit: Admitting: Pulmonary Disease

## 2024-07-13 ENCOUNTER — Encounter: Payer: Self-pay | Admitting: Pulmonary Disease

## 2024-07-13 VITALS — BP 119/60 | HR 69 | Ht <= 58 in | Wt 146.0 lb

## 2024-07-13 DIAGNOSIS — I5032 Chronic diastolic (congestive) heart failure: Secondary | ICD-10-CM | POA: Diagnosis not present

## 2024-07-13 DIAGNOSIS — R0902 Hypoxemia: Secondary | ICD-10-CM | POA: Diagnosis not present

## 2024-07-13 DIAGNOSIS — G4733 Obstructive sleep apnea (adult) (pediatric): Secondary | ICD-10-CM

## 2024-07-13 DIAGNOSIS — G4734 Idiopathic sleep related nonobstructive alveolar hypoventilation: Secondary | ICD-10-CM

## 2024-07-13 DIAGNOSIS — I272 Pulmonary hypertension, unspecified: Secondary | ICD-10-CM | POA: Diagnosis not present

## 2024-07-13 DIAGNOSIS — J479 Bronchiectasis, uncomplicated: Secondary | ICD-10-CM | POA: Diagnosis not present

## 2024-07-13 NOTE — Assessment & Plan Note (Signed)
 Amber Bennett

## 2024-07-13 NOTE — Assessment & Plan Note (Signed)
 Orders:    CT CHEST HIGH RESOLUTION; Future

## 2024-07-13 NOTE — Progress Notes (Unsigned)
 "  Established Patient Pulmonology Office Visit   Subjective:  Patient ID: Amber Bennett, female    DOB: 03-11-1951  MRN: [id number]  CC:  Chief Complaint  Patient presents with   Medical Management of Chronic Issues    Pt states everything been well     Discussed the use of AI scribe software for clinical note transcription with the patient, who gave verbal consent to proceed.  History of Present Illness Amber Bennett is a 74 year old female with pulmonary hypertension and sleep apnea who presents for follow-up in the pulmonary clinic.  She was hospitalized over Christmas for sepsis due to parotitis. She was treated with antibiotics and developed diarrhea, now treated with oral vancomycin  and Kaopectate. Diarrhea has improved and recent labs were negative for C. diff.  Her breathing is stable. She uses oxygen  with CPAP at night and has not needed daytime oxygen  since weight loss and starting heart failure medications. She notes CPAP mask-related facial marks and is cleaning the mask daily and considering padding. She feels better with consistent CPAP use.  She had a fall in September with vertebral fractures and wore a back brace for two months. A recent visit showed another vertebral fracture.  She uses Qvar  inhaler twice daily with benefit and a flutter device for airway clearance. She stopped pulmonary rehab due to illness and back injury and plans to resume outpatient physical rehab next week.        ROS   Current Medications[1]      Objective:  BP 119/60   Pulse 69   Ht 4' 10 (1.473 m) Comment: per pt  Wt 146 lb (66.2 kg)   LMP 07/01/1980   SpO2 98%   BMI 30.51 kg/m   Wt Readings from Last 3 Encounters:  07/13/24 146 lb (66.2 kg)  06/20/24 142 lb (64.4 kg)  04/07/24 149 lb 2 oz (67.6 kg)   BMI Readings from Last 3 Encounters:  07/13/24 30.51 kg/m  06/20/24 29.68 kg/m  04/07/24 31.17 kg/m    Physical Exam Constitutional:      General: She is not in  acute distress.    Appearance: Normal appearance.  Eyes:     General: No scleral icterus.    Conjunctiva/sclera: Conjunctivae normal.  Cardiovascular:     Rate and Rhythm: Normal rate and regular rhythm.  Pulmonary:     Breath sounds: No wheezing, rhonchi or rales.  Musculoskeletal:     Right lower leg: No edema.     Left lower leg: No edema.  Skin:    General: Skin is warm and dry.  Neurological:     General: No focal deficit present.      Diagnostic Review:  Last CBC Lab Results  Component Value Date   WBC 16.0 (H) 06/24/2024   HGB 13.4 06/24/2024   HCT 38.8 06/24/2024   MCV 93.7 06/24/2024   MCH 32.4 06/24/2024   RDW 13.0 06/24/2024   PLT 318 06/24/2024   Last metabolic panel Lab Results  Component Value Date   GLUCOSE 126 (H) 06/24/2024   NA 136 06/24/2024   K 3.0 (L) 06/24/2024   CL 101 06/24/2024   CO2 19 (L) 06/24/2024   BUN 8 06/24/2024   CREATININE 0.40 (L) 06/24/2024   GFRNONAA >60 06/24/2024   CALCIUM 9.4 06/24/2024   PROT 7.5 06/24/2024   ALBUMIN  3.3 (L) 06/24/2024   BILITOT 0.4 06/24/2024   ALKPHOS 156 (H) 06/24/2024   AST 70 (H) 06/24/2024  ALT 140 (H) 06/24/2024   ANIONGAP 17 (H) 06/24/2024       Assessment & Plan:   Assessment & Plan Pulmonary hypertension (HCC)  Orders:   Pulmonary Function Test; Future  OSA on CPAP     Nocturnal hypoxemia     Bronchiectasis without complication (HCC)  Orders:   CT CHEST HIGH RESOLUTION; Future  Chronic diastolic heart failure (HCC)      Assessment and Plan Assessment & Plan Pulmonary hypertension Breathing improved with heart failure management and weight loss. No daytime oxygen  requirement. - Ordered pulmonary function test and CT chest scan in March. - Scheduled follow-up visit in two months. - Continue current regimen  Bronchiectasis Previous CT scan showed airway dilation and thickening. - Ordered pulmonary function test and CT chest scan in March.  Obstructive sleep  apnea with nocturnal hypoxemia Managed with CPAP. Reports facial irritation from CPAP mask. No daytime oxygen  requirement. - Continue CPAP use at night. - Consider using foam padding to alleviate facial irritation from CPAP mask.  Chronic diastolic heart failure Breathing improved with heart failure management and weight loss. No daytime oxygen  requirement. - Continue current heart failure management regimen. - Monitor oxygen  levels during activity to ensure no daytime oxygen  requirement.      Return in about 2 months (around 09/10/2024) for f/u visit Dr. Kara.   Dorn KATHEE Kara, MD     [1]  Current Outpatient Medications:    acetaminophen  (TYLENOL ) 500 MG tablet, Take 500 mg by mouth every 6 (six) hours as needed for moderate pain (pain score 4-6) or mild pain (pain score 1-3)., Disp: , Rfl:    Alirocumab  (PRALUENT ) 150 MG/ML SOAJ, Inject 1 mL (150 mg total) into the skin every 14 (fourteen) days., Disp: 6 mL, Rfl: 3   AMBULATORY NON FORMULARY MEDICATION, Medication name: Diltiazem  2% with 5% lidocaine  Using your index finger, apply a small amount of medication inside the rectum up to your first knuckle/joint 2-3 times daily for 6-8 weeks., Disp: 30 g, Rfl: 1   amLODipine  (NORVASC ) 2.5 MG tablet, Take 1 tablet (2.5 mg total) by mouth at bedtime., Disp: 90 tablet, Rfl: 3   apixaban  (ELIQUIS ) 5 MG TABS tablet, TAKE 1 TABLET(5 MG) BY MOUTH TWICE DAILY, Disp: 180 tablet, Rfl: 2   beclomethasone (QVAR ) 80 MCG/ACT inhaler, Inhale 2 puffs into the lungs 2 (two) times daily., Disp: , Rfl:    calcitonin, salmon, (MIACALCIN/FORTICAL) 200 UNIT/ACT nasal spray, Place 1 spray into alternate nostrils daily., Disp: , Rfl:    Calcium Carb-Cholecalciferol  (CALTRATE 600+D3 PO), Take 1 tablet by mouth daily., Disp: , Rfl:    Cholecalciferol  (VITAMIN D ) 50 MCG (2000 UT) tablet, Take 2,000 Units by mouth daily., Disp: , Rfl:    Coenzyme Q10 (CO Q 10 PO), Take 200 mg by mouth daily., Disp: , Rfl:     colestipol  (COLESTID ) 1 g tablet, Take 1 g by mouth 2 (two) times daily., Disp: , Rfl:    diphenoxylate -atropine  (LOMOTIL ) 2.5-0.025 MG tablet, Take 1-2 tablets by mouth every 4 hours as needed for diarrhea/loose stools, Disp: 90 tablet, Rfl: 1   DULoxetine  (CYMBALTA ) 30 MG capsule, Take 1 capsule (30 mg total) by mouth every evening., Disp: 90 capsule, Rfl: 4   ezetimibe  (ZETIA ) 10 MG tablet, Take 10 mg by mouth every evening., Disp: , Rfl:    furosemide  (LASIX ) 40 MG tablet, Take 2 tablets (80 mg total) by mouth daily., Disp: 180 tablet, Rfl: 3   gabapentin  (NEURONTIN ) 800 MG tablet,  Take 1 tablet (800 mg total) by mouth 2 (two) times daily. (Patient taking differently: Take 800 mg by mouth at bedtime.), Disp: 180 tablet, Rfl: 3   hydrocortisone  2.5 % ointment, Apply 1 Application topically 2 (two) times daily. lips, Disp: , Rfl:    ibandronate (BONIVA) 150 MG tablet, Take 150 mg by mouth every 30 (thirty) days., Disp: , Rfl:    ibandronate (BONIVA) 150 MG tablet, Take 150 mg by mouth every 30 (thirty) days. Take in the morning with a full glass of water, on an empty stomach, and do not take anything else by mouth or lie down for the next 30 min., Disp: , Rfl:    ipratropium (ATROVENT ) 0.03 % nasal spray, Place 2 sprays into both nostrils every 12 (twelve) hours. (Patient taking differently: Place 2 sprays into both nostrils 2 (two) times daily as needed for rhinitis.), Disp: 30 mL, Rfl: 4   ipratropium-albuterol  (DUONEB) 0.5-2.5 (3) MG/3ML SOLN, Take 3 mLs by nebulization 3 (three) times daily as needed. (Patient taking differently: Take 3 mLs by nebulization 3 (three) times daily as needed (difficulty breathing).), Disp: 360 mL, Rfl: 5   loratadine  (CLARITIN ) 10 MG tablet, Take 1 tablet (10 mg total) by mouth daily as needed for allergies (Can take an extra dose during flare ups.)., Disp: 90 tablet, Rfl: 1   methocarbamol  (ROBAXIN ) 500 MG tablet, Take 1.5 tablets by mouth every 6 (six) hours as  needed for muscle spasms., Disp: , Rfl:    metoprolol  tartrate (LOPRESSOR ) 50 MG tablet, Take 1 tablet (50 mg total) by mouth 2 (two) times daily., Disp: , Rfl:    montelukast  (SINGULAIR ) 10 MG tablet, Take 10 mg by mouth at bedtime., Disp: , Rfl:    Multiple Vitamins-Minerals (MULTIVITAMIN PO), Take 1 tablet by mouth daily., Disp: , Rfl:    mupirocin ointment (BACTROBAN) 2 %, Apply 1 Application topically 2 (two) times daily., Disp: , Rfl:    omeprazole (PRILOSEC) 20 MG capsule, Take 20 mg by mouth 2 (two) times daily before a meal., Disp: , Rfl: 3   ondansetron  (ZOFRAN -ODT) 4 MG disintegrating tablet, Take 1 tablet (4 mg total) by mouth every 8 (eight) hours as needed for nausea or vomiting., Disp: 20 tablet, Rfl: 0   oxyCODONE  (ROXICODONE ) 5 MG immediate release tablet, Take 0.5-1 tablets (2.5-5 mg total) by mouth every 6 (six) hours as needed for severe pain (pain score 7-10) or breakthrough pain., Disp: 10 tablet, Rfl: 0   PHENobarbital  (LUMINAL) 97.2 MG tablet, TAKE 1 AND 1/2 TABLETS(145.8 MG) BY MOUTH DAILY, Disp: 135 tablet, Rfl: 1   potassium chloride  SA (KLOR-CON  M) 20 MEQ tablet, While taking 40 mg of Furosemide , take Potassium Chloride  20 meq twice a day. When you decrease Furosemide  to 20 mg, decrease Potassium Chloride  to 20 meq once a day. (Patient taking differently: Take 20 mEq by mouth daily.), Disp: 180 tablet, Rfl: 3   PRALUENT  150 MG/ML SOAJ, Inject 150 mg into the skin every 14 (fourteen) days., Disp: , Rfl:    pyridoxine  (B-6) 100 MG tablet, Take 100 mg by mouth daily., Disp: , Rfl:    sacubitril -valsartan  (ENTRESTO ) 49-51 MG, Take 1 tablet by mouth 2 (two) times daily., Disp: , Rfl:    topiramate  (TOPAMAX ) 50 MG tablet, TAKE 1 TABLET BY MOUTH IN THE MORNIGN& 2 TABS EVERY EVENING, Disp: 270 tablet, Rfl: 1   vitamin E 400 UNIT capsule, Take 400 Units by mouth daily., Disp: , Rfl:    ZEPBOUND  10 MG/0.5ML  Pen, Inject 10 mg into the skin once a week., Disp: , Rfl:   "

## 2024-07-13 NOTE — Patient Instructions (Addendum)
 Schedule pulmonary function tests at the front desk in March   Continue CPAP nightly with supplemental oxygen   Continue supplemental oxygen  for goal of 88% or higher  Continue Qvar  inhaler 2 puff twice daily  Continue entresto  and lasix  per cardiology  Follow up pulmonary function tests and CT Chest scan in March Schedule pulmonary function tests at the front desk   Follow up in 2 months, call sooner if needed

## 2024-07-14 ENCOUNTER — Ambulatory Visit (INDEPENDENT_AMBULATORY_CARE_PROVIDER_SITE_OTHER): Admitting: Internal Medicine

## 2024-07-14 ENCOUNTER — Encounter: Payer: Self-pay | Admitting: Internal Medicine

## 2024-07-14 ENCOUNTER — Encounter: Payer: Self-pay | Admitting: Pulmonary Disease

## 2024-07-14 ENCOUNTER — Other Ambulatory Visit: Payer: Self-pay | Admitting: Internal Medicine

## 2024-07-14 VITALS — BP 114/72 | HR 66 | Ht <= 58 in | Wt 146.0 lb

## 2024-07-14 DIAGNOSIS — R748 Abnormal levels of other serum enzymes: Secondary | ICD-10-CM | POA: Diagnosis not present

## 2024-07-14 DIAGNOSIS — A498 Other bacterial infections of unspecified site: Secondary | ICD-10-CM

## 2024-07-14 DIAGNOSIS — K521 Toxic gastroenteritis and colitis: Secondary | ICD-10-CM

## 2024-07-14 DIAGNOSIS — B356 Tinea cruris: Secondary | ICD-10-CM

## 2024-07-14 DIAGNOSIS — T3695XA Adverse effect of unspecified systemic antibiotic, initial encounter: Secondary | ICD-10-CM | POA: Diagnosis not present

## 2024-07-14 DIAGNOSIS — K648 Other hemorrhoids: Secondary | ICD-10-CM | POA: Diagnosis not present

## 2024-07-14 MED ORDER — CLOTRIMAZOLE-BETAMETHASONE 1-0.05 % EX CREA: 1.0000 | TOPICAL_CREAM | Freq: Two times a day (BID) | CUTANEOUS | 0 refills | Status: AC

## 2024-07-14 NOTE — Progress Notes (Signed)
 "  Subjective:    Patient ID: Amber Bennett, female    DOB: 1951/04/27, 74 y.o.   MRN: [id number]  HPI Amber Bennett is a 74 year old female with recent antibiotic-associated diarrhea with presumed C. difficile who presents for follow-up of persistent diarrhea.  She also has a history of adenomatous polyps, prior IBS-D, hepatic steatosis/MASLD, GERD, hemorrhoids and anal fissure, atrial fibrillation on Eliquis , hypertension, diabetes and recent hospitalization for parotitis.  She is here today with her husband.  Intermittent diarrhea became severe and uncontrolled on June 18, 2024, accompanied by weakness.  She reports her stool have the typical odor and appearance of prior C. difficile infection.  Empiric oral vancomycin  was started for possible C. difficile infection, though stool studies during and after admission were negative for C. diff, ova and parasites, and bacterial pathogens. She was hospitalized for parotiditis and received Decadron , Unasyn , Flagyl , and later Augmentin , with ENT consultation and potassium supplementation.  After hospital discharge on June 24, 2024, loose stools persisted, currently occurring about three times daily. She uses Imodium as needed and Lomotil  as a backup. She is finishing a course of oral vancomycin , with three days remaining. Colestipol  has been used in the past to help form stools, especially with unfamiliar foods, but she is unsure if she is currently taking it. Appetite has returned, and she denies abdominal pain.  During admission, mild elevation in liver enzymes was noted. Recent laboratory results show lower values compared to those during admission. An acute hepatitis panel was negative.  She has a painful, red, and protruding hemorrhoid, for which she uses hydrocortisone  suppositories and diltiazem  cream nightly, finding these helpful for pain and discomfort.  She was noted to have elevated liver enzymes during her hospitalization.  Her viral  hepatitis serologies were negative.  This was felt either to be secondary to sepsis or an antibiotic induced drug liver injury.  She reports her liver enzymes improved when checked and follow-up with Dr. Shayne earlier this week.  I do not have her liver enzymes available to me today but they have been requested.  Review of Systems As per HPI, otherwise negative  Current Medications, Allergies, Past Medical History, Past Surgical History, Family History and Social History were reviewed in [employer] record.    Objective:   Physical Exam BP 114/72   Pulse 66   Ht 4' 10 (1.473 m)   Wt 146 lb (66.2 kg)   LMP 07/01/1980   SpO2 96%   BMI 30.51 kg/m  Gen: awake, alert, NAD HEENT: anicteric  Abd: soft, NT/ND, +BS throughout Ext: no c/c/e Neuro: nonfocal  Labs C diff toxin gene NAA (07/13/2024): Negative; Negative on 06/18/2024 Stool culture (07/13/2024): Negative Ova and parasite (07/13/2024): Negative Acute hepatitis panel (06/21/2024): Negative AST (06/24/2024): 70 ALT (06/24/2024): 140 Alk phos (06/24/2024): 156 Total bilirubin (06/24/2024): 0.4 Hemoglobin (06/24/2024): 13.4 WBC (06/24/2024): 16 Platelet count (06/24/2024): 318      Assessment & Plan:   Antibiotic-associated diarrhea, presumed C. difficile recurrence Subacute diarrhea improving post-antibiotics with negative stool studies. Expected normalization of bowel habits. - Complete current course of oral vancomycin  (three days remaining). - Use loperamide 2 mg as needed for loose stools; max dose recommended 8 mg daily. - Use diphenoxylate -atropine  if loperamide as backup if loperamide insufficient. - Initiate or resume colestipol  1g twice daily if loose stools persist after vancomycin . - Explained gradual normalization of bowel habits post-antibiotics.  Hemorrhoids; perianal tinea Symptomatic internal and external hemorrhoids managed with topical  therapies and hydrocortisone  suppository  for relief. - Prescribed Lotrisone  cream twice daily for one to two weeks. - Continue topical lidocaine  (Recticare) as needed; samples provided.  She should use RectiCare advanced which is lidocaine  plus a vasoconstrictor. - Discontinue hydrocortisone  suppository; as current symptoms are felt secondary to perianal tinea and the steroids are working against this infection due to fecal smearing   Elevated liver enzymes Transient mild elevation likely due to antibiotic-induced cholestasis or sepsis, with expected normalization. Acute hepatitis panel negative. - Request most recent metabolic panel from primary care provider. - Follow-up in three to four months to reassess liver function.  MASLD Not specifically addressed today.  Previous FibroScan score = 13.  She is following with Dr. Wyatt at [facility].  On GLP-1 therapy.  No evidence for advanced liver disease or cirrhosis. - She will follow-up with Dr. Wyatt  -Attention to liver enzymes as above  I would like to see her back in 8 to 12 weeks, sooner if needed 45 minutes total spent today including patient facing time, coordination of care, reviewing medical history/procedures/pertinent radiology studies, and documentation of the encounter.  "

## 2024-07-14 NOTE — Patient Instructions (Addendum)
 Finish Vancomycin  for next 3 days.   If not taking Colestipol  after completing Vancomycin  and loose stools continue restart Colostipol twice daily.   If you are taking Colestipol  continue twice daily and use Imodium as needed and may use Lotomil as a backup.   We have sent the following medications to your pharmacy for you to pick up at your convenience: Lotrisone  cream apply to perianal area twice daily for 2 weeks.   Use Reticare Advance as needed.   Stop Hydrocortisone  suppository after 5 days.

## 2024-07-22 ENCOUNTER — Other Ambulatory Visit: Payer: Self-pay | Admitting: Internal Medicine

## 2024-07-22 DIAGNOSIS — E041 Nontoxic single thyroid nodule: Secondary | ICD-10-CM

## 2024-07-23 ENCOUNTER — Telehealth: Payer: Self-pay

## 2024-07-23 NOTE — Telephone Encounter (Signed)
 Copied from CRM [id number]. Topic: General - Other >> Jul 23, 2024 12:54 PM Russell PARAS wrote: Reason for CRM:   Pt is calling clinic regarding her last OV w/[facility]. She was advised the nurse would reach out to discuss testing for use of oxygen  and also testing for the pressure levels on her CPAP. However, she never heard from clinic  Requested call back  CB#  [phone]    Tried t reach out to patient  VM/ LM - return call     ( We will be closed due to the  pending  weather this weekend I can try and reach out Tuesday or Wednesday or if she would like to call back then )

## 2024-07-27 ENCOUNTER — Telehealth: Payer: Self-pay

## 2024-07-27 NOTE — Telephone Encounter (Signed)
 I believe her primary care is prescribing the zepbound  for obesity or DM. She has history of mild sleep apnea, which does not qualify her for zepbound .  Ok to order repeat sleep study or ONO if needed for any reason.  Thanks, JD

## 2024-07-27 NOTE — Telephone Encounter (Signed)
 Copied from CRM [id number]. Topic: General - Other >> Jul 23, 2024 12:54 PM Russell PARAS wrote: Reason for CRM:   Pt is calling clinic regarding her last OV w/[facility]. She was advised the nurse would reach out to discuss testing for use of oxygen  and also testing for the pressure levels on her CPAP. However, she never heard from clinic  Requested call back  CB#  [phone] >> Jul 23, 2024  2:58 PM Chantha C wrote: Patient is returning [facility] call. Informe patient, the office is closed and due to the weather the office will be closed on Monday 07/26/24, will send a message to [place] to call back on Tuesday. Please call back [phone].

## 2024-07-27 NOTE — Telephone Encounter (Signed)
 Copied from CRM [id number]. Topic: General - Other >> Jul 23, 2024 12:54 PM Russell PARAS wrote: Reason for CRM:   Pt is calling clinic regarding her last OV w/[facility]. She was advised the nurse would reach out to discuss testing for use of oxygen  and also testing for the pressure levels on her CPAP. However, she never heard from clinic  Requested call back  CB#  [phone] >> Jul 23, 2024  2:58 PM Chantha C wrote: Patient is returning [facility] call. Informe patient, the office is closed and due to the weather the office will be closed on Monday 07/26/24, will send a message to [place] to call back on Tuesday. Please call back [phone].    Spoke with [patient [facility], needs new studies done

## 2024-07-27 NOTE — Telephone Encounter (Signed)
 Copied from CRM #[id number]. Topic: Clinical - Request for Lab/Test Order >> Jul 27, 2024  1:46 PM Rilla B wrote: Reason for CRM: Patient is returning call to [facility] regarding testing.  No answer @ [facility].  Please call patient @ [phone]   See phone note 07/26/24

## 2024-07-28 ENCOUNTER — Other Ambulatory Visit: Payer: Self-pay

## 2024-07-28 DIAGNOSIS — G4733 Obstructive sleep apnea (adult) (pediatric): Secondary | ICD-10-CM

## 2024-07-28 NOTE — Progress Notes (Signed)
spli

## 2024-07-28 NOTE — Progress Notes (Signed)
 This encounter was created in error - please disregard.

## 2024-07-28 NOTE — Addendum Note (Signed)
 Addended by: ARMAND BURNARD SAUNDERS on: 07/28/2024 09:36 AM   Modules accepted: Orders, Level of Service

## 2024-07-28 NOTE — Telephone Encounter (Signed)
 Spoke with patient Amber Bennett, order placed

## 2024-07-29 ENCOUNTER — Telehealth: Payer: Self-pay | Admitting: Pulmonary Disease

## 2024-07-29 NOTE — Telephone Encounter (Signed)
 The sleep lab is wanting to know which in lab to schedule as there is a CPAP titration and a split night ordered. Please advise.

## 2024-07-29 NOTE — Telephone Encounter (Signed)
"   [facility] wants the split night done  "

## 2024-09-28 ENCOUNTER — Encounter

## 2024-09-28 ENCOUNTER — Ambulatory Visit: Admitting: Pulmonary Disease

## 2025-01-24 ENCOUNTER — Ambulatory Visit: Admitting: Adult Health
